# Patient Record
Sex: Female | Born: 1945 | Race: Black or African American | Hispanic: No | Smoking: Never smoker
Health system: Southern US, Community
[De-identification: ages and names within clinical notes are randomized; demographics above are authoritative.]

## PROBLEM LIST (undated history)

## (undated) ENCOUNTER — Ambulatory Visit: Admission: EM | Payer: Medicare PPO | Source: Home / Self Care

## (undated) DIAGNOSIS — E1140 Type 2 diabetes mellitus with diabetic neuropathy, unspecified: Secondary | ICD-10-CM

## (undated) DIAGNOSIS — T4145XA Adverse effect of unspecified anesthetic, initial encounter: Secondary | ICD-10-CM

## (undated) DIAGNOSIS — H269 Unspecified cataract: Secondary | ICD-10-CM

## (undated) DIAGNOSIS — R55 Syncope and collapse: Secondary | ICD-10-CM

## (undated) DIAGNOSIS — K5090 Crohn's disease, unspecified, without complications: Secondary | ICD-10-CM

## (undated) DIAGNOSIS — T8859XA Other complications of anesthesia, initial encounter: Secondary | ICD-10-CM

## (undated) DIAGNOSIS — D649 Anemia, unspecified: Secondary | ICD-10-CM

## (undated) DIAGNOSIS — I639 Cerebral infarction, unspecified: Secondary | ICD-10-CM

## (undated) DIAGNOSIS — IMO0002 Reserved for concepts with insufficient information to code with codable children: Secondary | ICD-10-CM

## (undated) DIAGNOSIS — E041 Nontoxic single thyroid nodule: Secondary | ICD-10-CM

## (undated) DIAGNOSIS — K589 Irritable bowel syndrome without diarrhea: Secondary | ICD-10-CM

## (undated) DIAGNOSIS — I10 Essential (primary) hypertension: Secondary | ICD-10-CM

## (undated) DIAGNOSIS — E119 Type 2 diabetes mellitus without complications: Secondary | ICD-10-CM

## (undated) DIAGNOSIS — E785 Hyperlipidemia, unspecified: Secondary | ICD-10-CM

## (undated) HISTORY — PX: BACK SURGERY: SHX140

## (undated) HISTORY — PX: OTHER SURGICAL HISTORY: SHX169

## (undated) HISTORY — DX: Nontoxic single thyroid nodule: E04.1

## (undated) HISTORY — DX: Type 2 diabetes mellitus with diabetic neuropathy, unspecified: E11.40

## (undated) HISTORY — DX: Cerebral infarction, unspecified: I63.9

## (undated) HISTORY — PX: COLONOSCOPY: SHX174

## (undated) HISTORY — DX: Crohn's disease, unspecified, without complications: K50.90

## (undated) HISTORY — DX: Irritable bowel syndrome, unspecified: K58.9

## (undated) HISTORY — DX: Reserved for concepts with insufficient information to code with codable children: IMO0002

## (undated) HISTORY — PX: HAND SURGERY: SHX662

## (undated) HISTORY — DX: Unspecified cataract: H26.9

## (undated) HISTORY — PX: ESOPHAGOGASTRODUODENOSCOPY: SHX1529

## (undated) HISTORY — DX: Syncope and collapse: R55

## (undated) HISTORY — DX: Hyperlipidemia, unspecified: E78.5

## (undated) HISTORY — DX: Essential (primary) hypertension: I10

## (undated) HISTORY — PX: ABDOMINAL HYSTERECTOMY: SHX81

## (undated) HISTORY — DX: Anemia, unspecified: D64.9

## (undated) HISTORY — DX: Type 2 diabetes mellitus without complications: E11.9

---

## 1997-11-08 ENCOUNTER — Encounter: Admission: RE | Admit: 1997-11-08 | Discharge: 1998-02-06 | Payer: Self-pay

## 1999-06-28 ENCOUNTER — Ambulatory Visit (HOSPITAL_COMMUNITY): Admission: RE | Admit: 1999-06-28 | Discharge: 1999-06-28 | Payer: Self-pay | Admitting: Neurosurgery

## 1999-06-28 ENCOUNTER — Encounter: Payer: Self-pay | Admitting: Neurosurgery

## 1999-07-11 ENCOUNTER — Encounter: Payer: Self-pay | Admitting: Neurosurgery

## 1999-07-11 ENCOUNTER — Ambulatory Visit (HOSPITAL_COMMUNITY): Admission: RE | Admit: 1999-07-11 | Discharge: 1999-07-11 | Payer: Self-pay | Admitting: Neurosurgery

## 1999-07-30 ENCOUNTER — Encounter: Payer: Self-pay | Admitting: Neurosurgery

## 1999-07-30 ENCOUNTER — Ambulatory Visit (HOSPITAL_COMMUNITY): Admission: RE | Admit: 1999-07-30 | Discharge: 1999-07-30 | Payer: Self-pay | Admitting: Neurosurgery

## 2000-02-25 ENCOUNTER — Encounter: Payer: Self-pay | Admitting: Neurosurgery

## 2000-02-27 ENCOUNTER — Encounter: Payer: Self-pay | Admitting: Neurosurgery

## 2000-02-27 ENCOUNTER — Ambulatory Visit (HOSPITAL_COMMUNITY): Admission: RE | Admit: 2000-02-27 | Discharge: 2000-02-28 | Payer: Self-pay | Admitting: Neurosurgery

## 2000-08-12 HISTORY — PX: BREAST SURGERY: SHX581

## 2000-08-12 HISTORY — PX: BREAST CYST EXCISION: SHX579

## 2001-04-27 ENCOUNTER — Emergency Department (HOSPITAL_COMMUNITY): Admission: EM | Admit: 2001-04-27 | Discharge: 2001-04-27 | Payer: Self-pay | Admitting: Emergency Medicine

## 2003-04-12 ENCOUNTER — Ambulatory Visit (HOSPITAL_COMMUNITY): Admission: RE | Admit: 2003-04-12 | Discharge: 2003-04-12 | Payer: Self-pay | Admitting: Internal Medicine

## 2004-08-12 HISTORY — PX: COLONOSCOPY: SHX174

## 2004-09-27 ENCOUNTER — Ambulatory Visit (HOSPITAL_COMMUNITY): Admission: RE | Admit: 2004-09-27 | Discharge: 2004-09-28 | Payer: Self-pay | Admitting: Orthopaedic Surgery

## 2005-05-02 ENCOUNTER — Ambulatory Visit: Payer: Self-pay | Admitting: Internal Medicine

## 2005-05-10 ENCOUNTER — Ambulatory Visit: Payer: Self-pay | Admitting: Internal Medicine

## 2005-05-21 ENCOUNTER — Ambulatory Visit: Payer: Self-pay | Admitting: Internal Medicine

## 2005-05-21 ENCOUNTER — Encounter: Payer: Self-pay | Admitting: Internal Medicine

## 2005-05-21 ENCOUNTER — Ambulatory Visit (HOSPITAL_COMMUNITY): Admission: RE | Admit: 2005-05-21 | Discharge: 2005-05-21 | Payer: Self-pay | Admitting: Internal Medicine

## 2005-06-04 ENCOUNTER — Ambulatory Visit (HOSPITAL_COMMUNITY): Admission: RE | Admit: 2005-06-04 | Discharge: 2005-06-04 | Payer: Self-pay | Admitting: Internal Medicine

## 2005-06-10 HISTORY — PX: OTHER SURGICAL HISTORY: SHX169

## 2007-01-30 ENCOUNTER — Ambulatory Visit: Payer: Self-pay | Admitting: Internal Medicine

## 2007-04-07 ENCOUNTER — Ambulatory Visit: Payer: Self-pay | Admitting: Internal Medicine

## 2007-05-04 ENCOUNTER — Ambulatory Visit: Payer: Self-pay | Admitting: Internal Medicine

## 2007-05-24 ENCOUNTER — Encounter: Admission: RE | Admit: 2007-05-24 | Discharge: 2007-05-24 | Payer: Self-pay | Admitting: Otolaryngology

## 2008-03-31 ENCOUNTER — Ambulatory Visit (HOSPITAL_COMMUNITY): Admission: RE | Admit: 2008-03-31 | Discharge: 2008-03-31 | Payer: Self-pay | Admitting: Obstetrics and Gynecology

## 2008-04-22 ENCOUNTER — Encounter (HOSPITAL_COMMUNITY): Admission: RE | Admit: 2008-04-22 | Discharge: 2008-05-09 | Payer: Self-pay | Admitting: Endocrinology

## 2008-06-01 DIAGNOSIS — E1169 Type 2 diabetes mellitus with other specified complication: Secondary | ICD-10-CM | POA: Insufficient documentation

## 2008-06-01 DIAGNOSIS — E1159 Type 2 diabetes mellitus with other circulatory complications: Secondary | ICD-10-CM | POA: Insufficient documentation

## 2008-06-01 DIAGNOSIS — R143 Flatulence: Secondary | ICD-10-CM

## 2008-06-01 DIAGNOSIS — R635 Abnormal weight gain: Secondary | ICD-10-CM | POA: Insufficient documentation

## 2008-06-01 DIAGNOSIS — M129 Arthropathy, unspecified: Secondary | ICD-10-CM | POA: Insufficient documentation

## 2008-06-01 DIAGNOSIS — E119 Type 2 diabetes mellitus without complications: Secondary | ICD-10-CM | POA: Insufficient documentation

## 2008-06-01 DIAGNOSIS — R197 Diarrhea, unspecified: Secondary | ICD-10-CM | POA: Insufficient documentation

## 2008-06-01 DIAGNOSIS — R142 Eructation: Secondary | ICD-10-CM

## 2008-06-01 DIAGNOSIS — K648 Other hemorrhoids: Secondary | ICD-10-CM | POA: Insufficient documentation

## 2008-06-01 DIAGNOSIS — R141 Gas pain: Secondary | ICD-10-CM | POA: Insufficient documentation

## 2008-06-01 DIAGNOSIS — K589 Irritable bowel syndrome without diarrhea: Secondary | ICD-10-CM | POA: Insufficient documentation

## 2008-06-01 DIAGNOSIS — K921 Melena: Secondary | ICD-10-CM | POA: Insufficient documentation

## 2008-06-01 DIAGNOSIS — I152 Hypertension secondary to endocrine disorders: Secondary | ICD-10-CM | POA: Insufficient documentation

## 2008-06-01 DIAGNOSIS — D638 Anemia in other chronic diseases classified elsewhere: Secondary | ICD-10-CM | POA: Insufficient documentation

## 2008-06-01 DIAGNOSIS — E785 Hyperlipidemia, unspecified: Secondary | ICD-10-CM

## 2008-06-01 DIAGNOSIS — I10 Essential (primary) hypertension: Secondary | ICD-10-CM

## 2008-06-01 DIAGNOSIS — R159 Full incontinence of feces: Secondary | ICD-10-CM | POA: Insufficient documentation

## 2009-07-19 ENCOUNTER — Ambulatory Visit: Payer: Self-pay | Admitting: Internal Medicine

## 2009-07-24 LAB — CONVERTED CEMR LAB
Basophils Absolute: 0 10*3/uL (ref 0.0–0.1)
Eosinophils Absolute: 0.2 10*3/uL (ref 0.0–0.7)
HCT: 36.2 % (ref 36.0–46.0)
Hemoglobin: 11.5 g/dL — ABNORMAL LOW (ref 12.0–15.0)
MCHC: 31.8 g/dL (ref 30.0–36.0)
MCV: 85.8 fL (ref 78.0–100.0)
Monocytes Absolute: 0.4 10*3/uL (ref 0.1–1.0)
Monocytes Relative: 6 % (ref 3–12)
RBC: 4.22 M/uL (ref 3.87–5.11)
WBC: 6.7 10*3/uL (ref 4.0–10.5)

## 2009-08-12 DIAGNOSIS — I639 Cerebral infarction, unspecified: Secondary | ICD-10-CM

## 2009-08-12 HISTORY — DX: Cerebral infarction, unspecified: I63.9

## 2009-08-18 ENCOUNTER — Ambulatory Visit (HOSPITAL_COMMUNITY): Admission: RE | Admit: 2009-08-18 | Discharge: 2009-08-18 | Payer: Self-pay | Admitting: Internal Medicine

## 2009-08-18 ENCOUNTER — Ambulatory Visit: Payer: Self-pay | Admitting: Internal Medicine

## 2009-08-23 ENCOUNTER — Encounter: Payer: Self-pay | Admitting: Internal Medicine

## 2009-08-23 ENCOUNTER — Telehealth (INDEPENDENT_AMBULATORY_CARE_PROVIDER_SITE_OTHER): Payer: Self-pay

## 2009-09-06 HISTORY — PX: COLONOSCOPY: SHX174

## 2009-10-30 ENCOUNTER — Encounter (INDEPENDENT_AMBULATORY_CARE_PROVIDER_SITE_OTHER): Payer: Self-pay

## 2009-11-10 ENCOUNTER — Encounter (INDEPENDENT_AMBULATORY_CARE_PROVIDER_SITE_OTHER): Payer: Self-pay | Admitting: *Deleted

## 2009-11-21 ENCOUNTER — Encounter: Payer: Self-pay | Admitting: Internal Medicine

## 2009-11-22 ENCOUNTER — Encounter: Payer: Self-pay | Admitting: Internal Medicine

## 2009-11-22 LAB — CONVERTED CEMR LAB
Basophils Absolute: 0 10*3/uL (ref 0.0–0.1)
Basophils Relative: 1 % (ref 0–1)
Eosinophils Absolute: 0.1 10*3/uL (ref 0.0–0.7)
Hemoglobin: 11.2 g/dL — ABNORMAL LOW (ref 12.0–15.0)
Lymphocytes Relative: 31 % (ref 12–46)
Lymphs Abs: 1.9 10*3/uL (ref 0.7–4.0)
MCHC: 30.9 g/dL (ref 30.0–36.0)
Monocytes Absolute: 0.5 10*3/uL (ref 0.1–1.0)
Monocytes Relative: 8 % (ref 3–12)
Platelets: 338 10*3/uL (ref 150–400)
RDW: 14.4 % (ref 11.5–15.5)

## 2010-04-30 ENCOUNTER — Encounter (INDEPENDENT_AMBULATORY_CARE_PROVIDER_SITE_OTHER): Payer: Self-pay

## 2010-06-15 ENCOUNTER — Encounter: Payer: Self-pay | Admitting: Internal Medicine

## 2010-06-29 ENCOUNTER — Encounter: Payer: Self-pay | Admitting: Internal Medicine

## 2010-09-13 NOTE — Progress Notes (Signed)
----   Converted from flag ---- ---- 08/23/2009 11:56 AM, Felicia Merl LPN wrote: Per Dr. Roseanne Kaufman note, pt to see extender in 3 months with CBC, and  to be told absolutely no ASA or NSAIDS. Pt was informed. ------------------------------

## 2010-09-13 NOTE — Letter (Signed)
Summary: DISABILITY DETERMINATION  DISABILITY DETERMINATION   Imported By: Hoy Morn 06/15/2010 14:38:00  _____________________________________________________________________  External Attachment:    Type:   Image     Comment:   External Document

## 2010-09-13 NOTE — Letter (Signed)
Summary: Recall, Labs Needed  [facility]  [address]   [place], [place] [address]   Phone: [phone]  Fax: [phone]    October 30, 2009  MELAINIE KRINSKY [address] [place], [place]  [address] December 07, 1945   Dear Ms. Trudo,   Our records indicate it is time to repeat your blood work.  You can take the enclosed form to the lab on or near the date indicated.  Please make note of the new location of the lab:   [place], 2nd floor   [place] office will call you within a week to ten business days with the results.  If you do not hear from [place] in 10 business days, you should call the office.  If you have any questions regarding this, call the office at [phone], and ask for the nurse.  Labs are due on 11/21/2009.   Sincerely,    Burnadette Peter LPN  [facility] Ph: [phone]   Fax: [phone]

## 2010-09-13 NOTE — Letter (Signed)
Summary: DISABILITY DETERMINATION  DISABILITY DETERMINATION   Imported By: Hoy Morn 06/29/2010 13:34:56  _____________________________________________________________________  External Attachment:    Type:   Image     Comment:   External Document

## 2010-09-13 NOTE — Letter (Signed)
Summary: Patient Notice, Colon Biopsy Results  [facility]  [address]   [place], [place] [address]   Phone: [phone]  Fax: [phone]       August 23, 2009   Felicia Pugh [address] [place], [place]  [address] [birth year]    Dear Ms. Timberman,  I am pleased to inform you that the biopsies taken during your recent colonoscopy did not show any evidence of cancer upon pathologic examination.  They did show inflammation.  Additional information/recommendations:  Please call [phone] to schedule a return visit to review your condition.  Continue with the treatment plan as outlined on the day of your exam.  Please call us if you are having persistent problems or have questions about your condition that have not been fully answered at this time.  Sincerely,    R. Garfield Cornea MD  [facility] Ph: [phone]    Fax: [phone]   Appended Document: Patient Notice, Colon Biopsy Results Letter mailed.

## 2010-09-13 NOTE — Letter (Signed)
Summary: [facility]  [facility]  [address]   [place], [place] [address]   Phone: [phone]  Fax: [phone]    11/10/2009  Felicia Pugh [address] [place], [place]  [address] 1945/11/18  Dear Ms. [facility],   Your physician has indicated that:   _______it is time to schedule an appointment.   _______you missed your appointment on______ and need to call and  reschedule.   _______you need to have lab work done.   _______you need to schedule an appointment to discuss lab or test results.   _______you need to call to reschedule your appointment that was scheduled on _________.   Please call our office at  [phone].    Thank you,    [facility] Ph: [phone]   Fax: [phone]

## 2010-09-13 NOTE — Miscellaneous (Signed)
Summary: Orders Update  Clinical Lists Changes  Orders: Added new Test order of T-CBC w/Diff [phone]) - Signed

## 2010-09-13 NOTE — Letter (Signed)
Summary: Recall, Labs Needed  [facility]  [address]   [place], [place] [address]   Phone: [phone]  Fax: [phone]    April 30, 2010  Felicia Pugh [address] [place], [place]  [address] 1946-06-25   Dear Ms. Buenger,   Our records indicate it is time to repeat your blood work.  You can take the enclosed form to the lab on or near the date indicated.  Please make note of the new location of the lab:   [place], 2nd floor   [place] office will call you within a week to ten business days with the results.  If you do not hear from [place] in 10 business days, you should call the office.  If you have any questions regarding this, call the office at [phone], and ask for the nurse.  Labs are due on 05/24/2010.   Sincerely,    Burnadette Peter LPN  [facility] Ph: [phone]   Fax: [phone]

## 2010-09-13 NOTE — Miscellaneous (Signed)
Summary: Orders Update  Clinical Lists Changes  Orders: Added new Test order of T-CBC w/Diff [phone]) - Signed

## 2010-12-25 NOTE — Assessment & Plan Note (Signed)
NAMEMarland Kitchen  Felicia, Pugh                 CHART#:  [id number]   DATE:  04/07/2007                       DOB:  April 13, 1946   Follow up diarrhea.   Felicia Pugh was last seen January 30, 2007. She has continued nonbloody,  watery diarrhea, upwards of four stools or more daily. Sometimes has  some accidents and has to wear protection at work.   This is in spite of taking Bentyl and Imodium. She had stool studies  done over at Dr. Erick Blinks and associates which basically what we have  from June is some negative stool culture and negative O&P. Do not see  white cells or clostridium difficile on the report. She has a history of  small-bowel and ileocecal valve ulcerations felt to be secondary to  NSAIDs although she adamantly denies any NSAIDs now. She did take a  seven-day course of ketorolac for her back recently and called in her,  and we advised her to take no more than a week's worth of therapy.   There is no family history of inflammatory bowel disease. Prior  colonoscopy demonstrated ileocecal valve ulcerations. Celiac antibody  panel came back negative. She has not lost any further weight. She has  not had any nausea or vomiting. Diabetes has been fairly well  maintained. CRP came back negative back on February 02, 2007 (0.1), mild  anemia with hemoglobin and hematocrit of 11.4 and 37.8. She has a  history of a hyper-proliferative anemia. She has been followed by Dr.  Robina Ade previously. Prior Givens capsule study demonstrated single erosion  versus AVM in the distal ileum. Otherwise was unremarkable.   CURRENT MEDICATIONS:  See updated list.   ALLERGIES:  No known drug allergies.   Exam today, looks in no acute distress. Weight 180, height 5 foot 2,  temperature 97.6. BP 180/82, pulse 72.  SKIN:  Warm and dry.  CHEST:  Lungs are clear to auscultation.  CARDIAC:  Regular rate and rhythm without murmur, gallop or rub.  ABDOMEN:  Flat, positive bowel sounds, soft, nontender without  appreciable mass or organomegaly.   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 Preliminary stool studies  came back negative. Diabetic visceral enteropathy remains in the  differential. New onset inflammatory bowel disease also in the  differential, but I am certainly not ready to give this lady that  diagnosis at this time. She has used nonsteroidals in the past but  denies any at this point in time.   RECOMMENDATIONS:  Will try off-label use cholestyramine 2 grams orally  daily, not to be taken within 2 hours before or after medications. This  approach has been discussed. She is to let me know if she gets  constipated. We will also go ahead and send off stool for a clostridium  difficile toxin assay as well as stool lactoferrin and a qualitative  fecal fat smear. She is to keep a stool diarrhea. I will plan to see  this nice lady back in one month.       Felicia Pugh, M.D.  Electronically Signed     RMR/MEDQ  D:  04/07/2007  T:  04/07/2007  Job:  [id number]

## 2010-12-25 NOTE — Assessment & Plan Note (Signed)
NAMEMarland Pugh  MARCH, JOOS                 CHART#:  [id number]   DATE:  05/04/2007                       DOB:  [birth year]   Followup diarrhea. Felicia Pugh had a fairly extensive evaluation for  diarrhea, continues to have problems, last seen April 07, 2007. She had  a single erosion and possible AVM on Givens previously, ulcerative  ileocecal valve, had been taking non steroidals, but adamantly denies  any these days. Celiac antibody came back negative as multiple stool  studies have done. CRP was normal. She is on Imodium and Bentyl and we  added 2 grams cholestyramine at her last visit. She says she can tell no  difference. She has 4 to 6 bowel movements daily. Almost never has a  formed bowel movement, has multiple accidents, and soiled her under  clothes and has had to leave work on more than 1 occasion since she was  last seen. She has not had any nausea or vomiting. Her weight is down 1  pound. Fecal fat qualitative analysis came back normal. Lactoferrin  however came back positive. C. Diff toxin assay came back negative.   CURRENT MEDICATIONS:  See up dated list.   ALLERGIES:  No known drug allergies.   FAMILY HISTORY:  Negative for chronic GI or liver illness aside from 1  sister with stomach cancer.   PHYSICAL EXAMINATION:  Today she looks well, weight 179 down 1 pound,  height 5 feet 2 inches, temperature 97.8, blood pressure 158/88, pulse  72.  SKIN: Warm and dry.  CHEST: Lungs are clear to auscultation.  CARDIAC EXAM: Regular rate and rhythm without murmur, gallop, or rub.  ABDOMEN: Nondistended, obese, soft, nontender without appreciable mass  or organomegaly.   ASSESSMENT:  Refractory diarrhea, history of ulcerative ileocecal valve.  Distant history of non steriodals but none now. Differential  at this  point I suppose would include early inflammatory bowel disease either  Crohn's ileocolitis or indeterminate colitis, irritable bowel syndrome,  and/or diabetic visceral  enteropathy would not adequately explain her  symptoms. At this juncture I have recommended that Felicia Pugh go ahead  and try a 1 month course of Entocort , keep stool diary, come back to  see me in 4 weeks and we will size things up. I told Felicia Pugh we may  be looking at a tertiary referral for second opinion as far as etiology  of her diarrhea is concerned. I have talked to her about Entocort given  her samples, and a prescription.  She is going to be taken 9 mg a day  for the next month. She is to continue to watch her blood sugars. It is  notable that back in 2004 Dr. Carlean Purl did Givens for [place] and she did have  ulcerations  throughout her small bowel felt to be more likely due to NSAIDs at the  time (which she was taking). Further recommends to follow.       Bridgette Habermann, M.D.  Electronically Signed     RMR/MEDQ  D:  05/04/2007  T:  05/04/2007  Job:  [id number]   cc:   Carolann Littler, M.D.

## 2010-12-25 NOTE — Assessment & Plan Note (Signed)
NAMEMarland Pugh  Felicia, TANKSLEY                 CHART#:  [id number]   DATE:  01/30/2007                       DOB:  07-22-1946   PROBLEM:  Diarrhea.  I have not seen this patient in really a good 2+  years.  She was evaluated here for diarrhea and anemia extensively  previously.  Last colonoscopy was 05/13/2005.  She was found to have an  ulcerative ileocecal valve.  Biopsies were nonspecific.  The rest of the  colon and terminal ileum appeared normal.  She has had 2 given small  bowel capsule studies 1 in [place] and 1 here, which revealed some  erosion and ulcerations in her small bowel, felt to be previously  related to NSAID.  She has a long history of Hemoccult positive stool,  previously felt to be related to nonsteroidals, but she also has a  myeloproliferative state.  Has been followed by Dr. Sonny Dandy, but she has  not seen anybody recently.  She states her diarrhea settled down to 1 to  2 bowel movements daily back in February of last year, until the last 2  months she has had 4 to 5 bowel movements daily and they have been  loose.  She has been taking Imodium.  She took a Medrol Dosepak recently  from her doctors at [employer] for low back pain, but she  adamantly denies any form of nonsteroidal agent recently.  She may have  had some stool studies through [facility], but I do not have  those results.  Celiac panel previously was determined to be negative.  EGD previously demonstrated some erosions in her bulb.  H. pylori  serology and CLO-testing came back negative previously.  She has not had  any melena or rectal bleeding.  No hematemesis.  No odynophagia,  dysphagia, early satiety, nausea or vomiting.  She weighed 196 pounds  back in 2006.  She weighs 180 pounds today.  She continues to work full  time for [employer], where she has worked now 60 years.   PAST MEDICAL HISTORY:  Significant for hyper proliferative anemia,  question myelodysplastic syndrome, previously  followed by Dr. Sonny Dandy.  Type 2 diabetes mellitus.  Hypertension.  Hypercholesterolemia.  Chronic  anemia.   SURGICAL HISTORY:  Include a breast biopsy, tubal ligation, partial  hysterectomy, EGD, and colonoscopy.   CURRENT MEDICATIONS:  Zocor 80 mg daily.  Folic acid 1 mg daily.  Glipizide ER 10 mg daily.  Lisinopril 20 mg daily.  Glucosamine b.i.d.  Multivitamin daily.   ALLERGIES:  No known drug allergies.   FAMILY HISTORY:  No history of IBD or colorectal neoplasia.  Mother died  with a stroke.  Father died with diabetic coma.   SOCIAL HISTORY:  The patient is married.  Employed with [employer].  No  tobacco.  No alcohol.  No illicit drugs.   REVIEW OF SYSTEMS:  No fevers or chills.  Some slow weight loss over the  past 2 years, as outlined above.   PHYSICAL EXAMINATION:  A very pleasant 65 year old lady resting  comfortably.  Weight 180, height 5 feet 3 inches, temperature 98, BP 124/78, pulse 76.  SKIN:  Warm and dry.  HEENT:  No scleral icterus.  Conjunctivae pink.  Oral cavity no lesions.  CHEST AND LUNGS:  Clear to auscultation.  CARDIAC:  Regular rate  and rhythm without murmur, gallop, or rub.  BREASTS:  Deferred.  ABDOMEN:  Obese.  Positive bowel sounds.  Soft and nontender.  Without  appreciable mass or organomegaly.  RECTAL:  Good sphincter tone.  No mass.  Rectal vault, scant formed  brown stool.  Hemoccult negative.   IMPRESSION:  The patient is a pleasant 65 year old African-American  female with long-standing anemia, history of a gastrointestinal bleed,  history of small bowel and ileocecal valve ulcers of uncertain  significance, although has been associated with NSAID in the past, she  denies adamantly any use more recently.  Now having recurrent diarrhea.  [facility] IBD panel is previously negative.  Small bowel follow through  previously negative.  She has not been on any antibiotics recently.  This could be diabetic visceral enteropathy producing diarrhea.   She  could have a superimposed infectious process, or we could be on the very  front end of new-onset inflammatory bowel disease that has not been  otherwise clinically manifest at this time.  Of note, her diarrhea did  not improve when she was on a brief course of steroids for her low back  recently.  Celiac pane came back negative previously, although it would  be unusual for an African-American to have celiac disease anyway.   It does not sound as though she has steatorrhea.  She has no reason to  have a malabsorptive syndrome.   RECOMMENDATIONS:  Begin Bentyl 10 mg orally t.i.d. prescription given.  Will send off a complete set of stool studies, obtain a CBC, BMET, and  sed rate, and C-reactive protein today.  Will make further  recommendations in the very near future.       Bridgette Habermann, M.D.  Electronically Signed     RMR/MEDQ  D:  01/30/2007  T:  01/30/2007  Job:  [id number]   cc:   [facility]  Deeann Saint, M.D.

## 2010-12-28 NOTE — Op Note (Signed)
Felicia Pugh, Felicia Pugh NO.:  [id number]   MEDICAL RECORD NO.:  [id number]          PATIENT TYPE:  OIB   LOCATION:  2899                         FACILITY:  [facility]   PHYSICIAN:  Rennis Harding, M.D.        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   PROCEDURE:  Right L5-S1 microdiskectomy.   SURGEON:  Rennis Harding, M.D.   ASSISTANT:  Karenann Cai, P.A.   ANESTHESIA:  General endotracheal.   COMPLICATIONS:  None.   INDICATIONS:  The patient is a 65 year old female with severe right lower  extremity radiculopathy secondary to an extruded disk herniation.  She had a  previous history of L4-5 diskectomy on the left side.  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  There is no slip at the  L5-S1 level, where she is currently suffering from radiculopathy.   I reviewed the risks and benefits of microdiskectomy with her.  She has  failed to respond to conservative measures and elected to proceed.   PROCEDURE:  The patient was properly identified in the holding area and  taken to the operating room, underwent general endotracheal anesthesia,  without difficulty given prophylactic IV antibiotics.  She was carefully  positioned prone on the Wilson frame.  All bony prominences were padded,  face and eyes protected all times, back prepped and draped in the usual  sterile fashion.  A midline incision was made over the L5-S1 level.  Dissection was carried through the deep fascia.  A curette was placed into  the L4-5 interspace and x-ray taken confirming its location.  We then  exposed the L5-S1 interspace, and a second x-ray was taken confirming this  location.  A McCullough retractor was placed.  A high-speed bur used to  remove the inferior half of the L5 lamina.  The ligamentum  flavum was  removed piecemeal.  The S1 nerve root was identified and seen to be  displaced dorsally by the disk rupture.  The nerve root could not be  mobilized medially because of the extruded fragment.  We could visualize the  disk herniation within the axilla of the nerve root.  A micropituitary was  used to remove a disk fragment from within the axilla of the nerve root.  We  were then able to gently tease the nerve root medially and removed a large  free fragment.  We explored the posterior annulus and could not find the  rent.  We elected not to enter the disk space.  We explored the spinal canal  and felt that the decompression was complete.  The wound was irrigated.  Bleeding was controlled by electrocautery and Gelfoam.  Fentanyl 2 mL left  in the epidural space.  A small piece of Gelfoam was left over the exposed  dura.  The deep fascia closed with 0 Vicryl, subcutaneous layer closed with  2-0 Vicryl,  followed by Dermabond on the skin edges.  The patient was turned supine, extubated without difficulty and transferred  to recovery in stable condition.  It should be noted that the surgical  microscope was utilized after the initial exposure.      MC/MEDQ  D:  09/27/2004  T:  09/28/2004  Job:  [id number]

## 2010-12-28 NOTE — H&P (Signed)
NAME:  Felicia Pugh, Felicia Pugh NO.:  [id number]   MEDICAL RECORD NO.:  [id number]          PATIENT TYPE:  OIB   LOCATION:  NA                           FACILITY:  [facility]   PHYSICIAN:  Rennis Harding, M.D.        DATE OF BIRTH:  [birth year]   DATE OF ADMISSION:  09/27/2004  DATE OF DISCHARGE:                                HISTORY & PHYSICAL   CHIEF COMPLAINT:  Pain in my back and weakness in my leg.[name]   PRESENT ILLNESS:  A 65 year old black female who is status post lumbar  laminectomy from December 2005.  She has had persistent chronic back pains  after the lumbar laminectomy in 2003 by Dr. Christella Noa.  That surgery was done  at L4-L5.  Around Christmas time of 2005, she had a marked increase in pain  with radiation to the right lower extremity, numbness, tingling, and  weakness.  She had been treated with dose pack, pain medications.  MRI has  shown a large right L5-S1 disk herniation.  She has a positive straight leg  raise on the right and decreased sensation at the right S1 distribution.  She has lost her right ankle jerk.  Due to these problems __________  and  her persistent pain despite her stoicism and managing the pain, it is felt  she would benefit with surgical intervention and is admitted for a L5-S1  microdiskectomy on the right.   PAST MEDICAL HISTORY:  1.  This lady has been in relatively good health throughout her life time.  2.  She has developed hypertension.  3.  Chronic anemia.  4.  Non-insulin-dependent diabetes mellitus.   She has no medical allergies.   CURRENT MEDICATIONS:  1.  Avandia 8 mg one daily.  2.  Glipizide 10 mg one daily.  3.  Lisinopril 20 mg one daily.  4.  Zocor 40 mg one daily.  5.  Folic acid 1 mg one daily.  6.  She is taking hydrocodone for discomfort.  7.  Skelaxin as a muscle relaxant.   FAMILY PHYSICIAN:  Dr. Mercie Eon of [facility]  in [place], [place].   PAST SURGERIES:  1.  Lumbar  laminectomy in 2003.  2.  Benign breast biopsy in the past.  3.  Tubal ligation, 1969.  4.  Hysterectomy, 1987.   FAMILY HISTORY:  Positive for hypertension, diabetes, cancer, stroke, and  arthritis.   SOCIAL HISTORY:  The patient is married.  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  Has no intake of alcohol, tobacco products.  Has five  children and her husband will be major caregiver after surgery.   REVIEW OF SYSTEMS:  CNS:  No seizure disorder, paralysis, numbness, double  vision but the patient does have tingling and weakness in the lower  extremity with radicular pain on the right.  Cardiovascular:  No chest pain,  no angina, no orthopnea.  RESPIRATORY:  No productive cough, no hemoptysis,  no shortness of breath.  GASTROINTESTINAL:  No nausea, vomiting, melena, or  bloody stool but the  patient does have intermittent diarrhea.  GENITOURINARY:  No discharge, dysuria, or hematuria.  MUSCULOSKELETAL:  Primarily in present illness.   PHYSICAL EXAMINATION:  GENERAL:  Alert, cooperative, well groomed, 4-year-  old black female.  VITAL SIGNS:  Blood pressure 112/56, pulse 80, respirations 12.  HEENT:  Normocephalic.  PERRLA.  EOMs intact.  Oropharynx is clear.  CHEST:  Clear to auscultation.  No rhonchi, no rales.  HEART:  Regular rate and rhythm.  There is a grade 3/6 murmur heard best at  right sternal border.  Non-radiating to the neck.  ABDOMEN:  Soft, nontender.  Liver spleen not felt.  GENITALIA/RECTAL/PELVIC/BREASTS:  Not done, not pertinent to present  illness.  EXTREMITIES:  As in present illness above.   ADMITTING DIAGNOSIS:  1.  L5-S1 herniated nucleus pulposus.  2.  Hypertension.  3.  Non-insulin-dependent diabetes mellitus.  4.  Chronic anemia.   PLAN:  The patient will undergo a L5-S1 microdiskectomy on the right.  She  has been cleared preoperatively by Dr. Mercie Eon for this surgery.  Today she is fitted with a lumbosacral corset which will be  used during the  postoperative period.  This history and physical was performed in our office  today.      DLU/MEDQ  D:  09/19/2004  T:  09/19/2004  Job:  [id number]   cc:   Mercie Eon, Dr.  [facility]  [place], [place]

## 2010-12-28 NOTE — Op Note (Signed)
NAMESHELENE, KRAGE                ACCOUNT NO.:  [id number]   MEDICAL RECORD NO.:  [id number]          PATIENT TYPE:  AMB   LOCATION:  DAY                           FACILITY:  [facility]   PHYSICIAN:  R. Garfield Cornea, M.D. DATE OF BIRTH:  [birth year]   DATE OF PROCEDURE:  06/04/2005  DATE OF DISCHARGE:  06/04/2005                                 OPERATIVE REPORT   OPERATION/PROCEDURE:  Givens Capsule study report.   INDICATIONS:  The patient is a 65 year old lady with anemia, hemoccult  positive stool.  Prior Givens study of the small bowel down in [place]  implied erosions and ulcerations. Small bowel was felt to be secondary to  NSAID injury.  She is refrained from using NSAID. She continues to be  hemoccult positive.  Colonoscopy on May 21, 2005 demonstrated eroded  ulcerated ileocecal valve with mucosal hemorrhage present.  Biopsies  revealed nonspecific ulceration.  Prior small bowel follow-through was  normal.  Repeat Givens Capsule study now being done to reassess her small  bowel.  This approach has been discussed with the patient along with  potential risks, benefits and alternatives.   PROCEDURAL DATA:  Height 62 inches.  Weight 197 pounds.  Waist 43 inches.  Built stocky.  Gastric passage time 19 minutes.  Small bowel passage time 2  hours and 4 minutes.  The patient was found to have an erosion versus a  small vascular malformation in the distal ileum.  Otherwise the small bowel  mucosa appeared normal.   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  Otherwise, normal small bowel.  The patient had a frank ulcer  at the ileocecal valve on recent colonoscopy. Biopsies nonspecific.  Etiology of this lesion is not defined.   She adamantly denies use of any form of nonsteroidal agent.  I doubt we are  dealing with ischemia.  There are isolated reports of lone ileocecal valve  ulcerations in a setting of purgative prep such as  sodium phosphate for  colonoscopy.  The patient is on a preparation called bentonite detox which  contains bentonite clay. This is a some type of herbal detoxicate which is  built to cleanse the colon of toxins.  This may or may not have anything to  do with the endoscopic findings __________  hemoccult-positive stool.   For now, I will ask Mrs. Whobrey to stop taking this preparation and refrain  from taking any aspirin, NSAID preparations. We will go ahead and do a  [employer] antibody screen for inflammatory bowel disease.  Will make  further recommendations in the very near future.      Bridgette Habermann, M.D.  Electronically Signed     RMR/MEDQ  D:  06/10/2005  T:  06/10/2005  Job:  [id number]   cc:   Carolann Littler, M.D.  Fax: [place]  Fax: [phone]

## 2010-12-28 NOTE — Op Note (Signed)
[place]  Patient:    Felicia Pugh, Felicia Pugh                       MRN: [id number] Proc. Date: 02/27/00 Adm. Date:  [id number] Disc. Date: [id number] Attending:  Ashok Pall                           Operative Report  PREOPERATIVE DIAGNOSIS: 1. Displaced disk, left L4-5 lateral. 2. Left L4 radiculopathy.  POSTOPERATIVE DIAGNOSIS: 1. Displaced disk, left L4-5 lateral. 2. Left L4 radiculopathy.  OPERATION PERFORMED:  Left lateral diskectomy with microdissection L4-5 including a partial superior facetectomy, L4-5.  COMPLICATIONS:  None.  SURGEON:  Kyle L. Christella Noa, M.D.  ASSISTANT:  Marchia Meiers. Vertell Limber, M.D.  FINDINGS:  Large displaced disk laterally placed causing pressure on the left L4 nerve root.  ANESTHESIA:  INDICATIONS FOR PROCEDURE:  The patient is a 65 year old woman who has had pain in the back, hips and left lower extremity for approximately two years. Felicia Pugh had conservative treatment including epidural steroid injections which did not improve her situation.  I therefore recommended and she agreed to undergo far lateral lumbar diskectomy at L4-5.  DESCRIPTION OF PROCEDURE:  Felicia Pugh was brought to the operating room, intubated and placed under general anesthesia without difficulty.  After being prepped, she had a spinal needle placed sterilely to localize the incision. X-ray was taken and the needle removed.  I then infiltrated my proposed incision with 0.5% lidocaine 1:200,000 strength epinephrine.  I opened the incision with a #10 blade and took this down to the thoracolumbar fascia sharply and with monopolar cautery.  I proceeded to use the monopolar cautery to reflect in the superperiosteal fashion, the paraspinous muculature surrounding the lamina of L4.  I exposed the lamina and thought it was L4.  I placed a double-ended ganglion knife inferior to this lamina, took an x-ray and it was identified as the L4 lamina.  Using  that as a guide, I then retracted over the facets at L4-5 and at L3-4 to gain adequate tissue exposure.  I then identified the pars interarticularis at L4 and I drilled out the pars using a Midas Rex drill.  I brought the microscope into position and was able to use Kerrison punches to get underneath the thin shelf of bone that I left after drilling.  I exposed the ligamentum flavum and after removing that, exposed the left L4 nerve root.  I clearly identified the pedicle and the nerve and then dissected inferior to the nerve root.  There was a large hump of disk material which was present there.  I coagulated the surface, then opened that with a #15 blade.  Using a combination of pituitary rongeurs and nerve hooks, I was able to remove the disk material.  I then inspected the nerve root and felt that there was no compression laterally or medially on the nerve root.  I irrigated the wound.  I then placed Depakote in the resection site.  I irrigated prior to doing that.  I closed the wound in layered fashion reapproximating the thoracolumbar fascia, then the subcutaneous tissues.  I used Durabond for a dressing.  The patient was then extubated, rolled supine and was moving all extremities. DD:  02/27/00 TD:  02/28/00 Job: [id number]

## 2010-12-28 NOTE — H&P (Signed)
[place]. [facility]  Patient:    Felicia Pugh, Felicia Pugh                       MRN: [id number] Adm. Date:  [id number] Attending:  Ashok Pall                         History and Physical  ADMISSION DIAGNOSES: 1. Displaced disk, left L4-5 in the lateral position. 2. Left L4 radiculopathy.  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  She says at first it hurt only some of the time, now it hurts all of the time.  She has occasional numbness in the left lower extremity.  She does not have weakness but she does have leg and back pain.  The leg pain is mainly on her left side, thought it can come on both sides.  She has had no bowel or bladder dysfunction.  She has missed sometime at work because of the pain, and she has had some physical therapy for this, which has not provided lasting relief. Felicia Pugh, on MRI, has a lateral disk herniation at L4-5 on the left side. She had minor disk bulges in the lumbar spine but nothing causing neural compression and no motor changes.  Alignment was quite good.  Plain film showed no evidence of significant spondylosis.  Some minor changes but nothing which was so severe.  I therefore had Felicia Pugh try epidural steroid injections.  The injections did not help and she still is having a lot of pain in the left lower extremity.  She has had some shooting pain which went into both hips.  I explained to Felicia Pugh that I did not expect the operation at L4-5 on the left side to effect her right lower extremity in any positive or negative way.  She also understands that I do not expect this to improve her low-back pain to any significant degree.  PAST MEDICAL HISTORY:  Includes hypertension, diabetes, gastrointestinal problems.  ALLERGIES:  No known drug allergies.  MEDICATIONS: 1. Avandia 4 mg once a day. 2. Glucotrol XL 10 mg once  per day. 3. Glucophage 500 mg once per day. 4. Zestril 10 mg once per day. 5. Lotronex 1 mg two times per day. 6. She takes ibuprofen and Motrin over-the-counter for pain.  REVIEW OF SYSTEMS:  Positive for eye glasses, hypertension, foot edema, leg pain with walking, leg weakness, back pain, leg pain, diabetes.  She denied constitutional, ear, nose, throat, mouth, respiratory, gastrointestinal, genitourinary, skin, neurological, psychiatric, hematologic, and allergic problems.  FAMILY HISTORY:  Mother and father are both deceased.  Hypertension and diabetes present in the family.  PAST SURGICAL HISTORY:  She has had a biopsy, hysterectomy and a tubal ligation.  HABITS:  She does not smoke, drink or use illicit drugs.  PHYSICAL EXAMINATION:  VITAL SIGNS:  She is 5 feet 2 inches tall, weighing 178 pounds.  GENERAL:  On examination she alert, oriented x 4 and answers all questions appropriately.  HEENT:  Pupils equal, round, and reactive to light.  She has full extraocular movements.  She has symmetric facies.  No symmetric facial movement.  Uvula elevates in the midline.  Tongue protrudes in the midline.  EXTREMITIES:  Shoulder shrug is normal.  Normal strength in the upper extremities.  Weakness with hip flexion on the  left side.  She says it hurts to lift the leg up and without giving any resistance, she could not keep her leg in the air.  She has no difficulty with walking.  No difficulty flexing the hip when she is sitting down.  She was weak on the left than the right side.  She otherwise had good strength on her right side but did have weakness in the hamstrings on the left and in the quadriceps on the left.  Toes are downgoing to plantar stimulation and no clonus observed.  Proprioception and pinprick normal.  Normal loss of tone, bulk and coordination.  Gait was normal.  She could toe walk, heel walk and do deep knee bends.  Gait steady. No cervical masses or bruits  identified.  She had weak pulses at the wrist bilaterally.  Pulses about 64 and ______.  She also developed a erythematous pruritic, pustular lesions about three days ago.  They have sprung about her body, on the back of the neck, on her extremities, trunk.  They are pruritic and they have caused some problems in terms of itching.  She has been taking Benadryl but stopped this morning.  She has no lesions on her lower back.  She has not changed soaps, detergent or put on any new creams or lotions.  I felt that there was no risk with regards to the operation and have therefore approved with case. DD:  02/27/00 TD:  02/27/00 Job: [id number]

## 2010-12-28 NOTE — Op Note (Signed)
Felicia Pugh, Felicia Pugh                ACCOUNT NO.:  [id number]   MEDICAL RECORD NO.:  [id number]          PATIENT TYPE:  AMB   LOCATION:  DAY                           FACILITY:  [facility]   PHYSICIAN:  R. Garfield Cornea, M.D. DATE OF BIRTH:  07-15-1946   DATE OF PROCEDURE:  05/21/2005  DATE OF DISCHARGE:                                 OPERATIVE REPORT   PROCEDURE:  Colonoscopy with biopsy and ileoscopy.   INDICATIONS FOR PROCEDURE:  A 65 year old lady with history of anemia, heme  positive stool, history of NSAID injury to her small bowel. Three out of  three Hemoccult cards came back positive recently. Colonoscopy is now being  done. This approach has been discussed with the patient at length. Potential  risks, benefits, and alternatives have been reviewed and questions answered.  She is agreeable. Please see documentation in the medical record.   PROCEDURE NOTE:  O2 saturation, blood pressure, pulse, and respirations were  monitored throughout the entire procedure. Conscious sedation with IV Versed  and Demerol in incremental doses.   INSTRUMENT:  Olympus video chip system.   FINDINGS:  Digital rectal exam revealed no abnormalities.   ENDOSCOPIC FINDINGS:  Prep was adequate.   Rectum:  Examination of the rectal mucosa revealed no abnormalities. Rectal  vault was somewhat small. I was unable to retroflex. For the same reason, I  was able to see the rectal mucosa well, and it appeared normal.   Colon:  Colonic mucosa was surveyed from the rectosigmoid junction through  the left, transverse, and right colon to the area of the appendiceal  orifice, ileocecal valve, and cecum. These structures were well seen and  photographed for the record. Terminal ileum was intubated to 15 cm. From  this level, the scope was slowly withdrawn, and all previously mentioned  mucosal surfaces were again seen. It is notable there was two areas of  bleeding mucosa at ileocecal valve with apparent  underlying erosion versus  ulceration. Please see photos. However, looking up in the terminal ileum a  good 15 cm, TI appeared normal. The area of abnormality of the ileocecal  valve was biopsied for histologic study.   From this level, the scope was slowly withdrawn, and all previously  mentioned mucosal surfaces were again seen. The colonic mucosa appeared  normal otherwise. The patient tolerated the procedure well and was reactive  to endoscopy.   IMPRESSION:  1.  Normal rectum.  2.  Normal colon except for ileocecal valve, eroded/ulcerated areas at      ileocecal valve with mucosal hemorrhage present status post biopsy. The      remainder of colonic mucosa and terminal ileal mucosa appeared normal.   The patient denies nonsteroidals in any form.   RECOMMENDATIONS:  1.  Will follow up on pathology.  2.  Further recommendations to follow.      Bridgette Habermann, M.D.  Electronically Signed     RMR/MEDQ  D:  05/21/2005  T:  05/21/2005  Job:  [id number]   cc:   Deeann Saint, M.D.  Fax: [phone]   Fanny Bien  Fax: [phone]

## 2012-11-04 ENCOUNTER — Ambulatory Visit (INDEPENDENT_AMBULATORY_CARE_PROVIDER_SITE_OTHER): Payer: Medicare Other | Admitting: General Practice

## 2012-11-04 ENCOUNTER — Encounter: Payer: Self-pay | Admitting: General Practice

## 2012-11-04 VITALS — BP 155/90 | HR 92 | Temp 97.3°F | Ht 63.0 in | Wt 189.0 lb

## 2012-11-04 DIAGNOSIS — E785 Hyperlipidemia, unspecified: Secondary | ICD-10-CM

## 2012-11-04 DIAGNOSIS — E119 Type 2 diabetes mellitus without complications: Secondary | ICD-10-CM

## 2012-11-04 DIAGNOSIS — I10 Essential (primary) hypertension: Secondary | ICD-10-CM

## 2012-11-04 DIAGNOSIS — D649 Anemia, unspecified: Secondary | ICD-10-CM

## 2012-11-04 DIAGNOSIS — E559 Vitamin D deficiency, unspecified: Secondary | ICD-10-CM

## 2012-11-04 LAB — POCT CBC
Granulocyte percent: 74.5 %G (ref 37–80)
Hemoglobin: 11.3 g/dL — AB (ref 12.2–16.2)
Lymph, poc: 1.9 (ref 0.6–3.4)
MCH, POC: 27.1 pg (ref 27–31.2)
MCHC: 32.4 g/dL (ref 31.8–35.4)
MCV: 83.8 fL (ref 80–97)
POC Granulocyte: 6.6 (ref 2–6.9)
Platelet Count, POC: 353 10*3/uL (ref 142–424)
RDW, POC: 14.9 %

## 2012-11-04 LAB — BASIC METABOLIC PANEL WITH GFR
Calcium: 9.8 mg/dL (ref 8.4–10.5)
Chloride: 109 mEq/L (ref 96–112)
GFR, Est African American: >89 mL/min
GFR, Est Non African American: >89 mL/min
Potassium: 3.9 mEq/L (ref 3.5–5.3)

## 2012-11-04 LAB — HEPATIC FUNCTION PANEL
AST: 17 U/L (ref 0–37)
Indirect Bilirubin: 0.3 mg/dL (ref 0.0–0.9)
Total Bilirubin: 0.4 mg/dL (ref 0.3–1.2)

## 2012-11-04 MED ORDER — ATORVASTATIN CALCIUM 40 MG PO TABS: 40.0000 mg | ORAL_TABLET | Freq: Every morning | ORAL | Status: DC

## 2012-11-04 MED ORDER — LISINOPRIL 20 MG PO TABS: 20.0000 mg | ORAL_TABLET | Freq: Every day | ORAL | Status: DC

## 2012-11-04 MED ORDER — INSULIN DETEMIR 100 UNIT/ML ~~LOC~~ SOLN: 60.0000 [IU] | Freq: Every day | SUBCUTANEOUS | Status: DC

## 2012-11-04 MED ORDER — INSULIN DETEMIR 100 UNIT/ML ~~LOC~~ SOLN: 55.0000 [IU] | Freq: Every day | SUBCUTANEOUS | Status: DC

## 2012-11-04 MED ORDER — GLIPIZIDE 10 MG PO TABS: 10.0000 mg | ORAL_TABLET | ORAL | Status: DC

## 2012-11-04 NOTE — Progress Notes (Signed)
Subjective:    Patient ID: Felicia Pugh, female    DOB: 1946/05/17, 67 y.o.   MRN: [id number]  Diabetes She presents for her follow-up diabetic visit. She has type 1 diabetes mellitus. No MedicAlert identification noted. The initial diagnosis of diabetes was made 13 years ago. Pertinent negatives for hypoglycemia include no dizziness, headaches, nervousness/anxiousness, sleepiness, speech difficulty or sweats. Pertinent negatives for diabetes include no blurred vision, no chest pain, no fatigue, no foot ulcerations, no visual change and no weight loss. Diabetic complications include a CVA. Risk factors for coronary artery disease include dyslipidemia and hypertension. Current diabetic treatment includes insulin injections and oral agent (monotherapy). She is compliant with treatment all of the time. Her weight is stable. She is following a diabetic and high fiber (working on low fat diet) diet. Meal planning includes avoidance of concentrated sweets. She has not had a previous visit with a dietician. Exercise: very active. There is no change in her home blood glucose trend. Her breakfast blood glucose range is generally 110-130 mg/dl. (70-130, only checks in am) An ACE inhibitor/angiotensin II receptor blocker is being taken. She does not see a podiatrist.Eye exam is current (2014, January).  Hypertension This is a chronic (also hyperlipidemia) problem. The current episode started more than 1 year ago. The problem is unchanged. The problem is controlled. Pertinent negatives include no blurred vision, chest pain, headaches or sweats. There are no associated agents to hypertension. Risk factors for coronary artery disease include diabetes mellitus and dyslipidemia. Past treatments include ACE inhibitors. There are no compliance problems.  Hypertensive end-organ damage includes CVA. There is no history of a thyroid problem. CVA 2009. There is no history of chronic renal disease.      Review of Systems   Constitutional: Negative for weight loss and fatigue.  Eyes: Negative for blurred vision.  Cardiovascular: Negative for chest pain.  Neurological: Negative for dizziness, speech difficulty and headaches.  Psychiatric/Behavioral: The patient is not nervous/anxious.        Objective:   Physical Exam  Constitutional: She is oriented to person, place, and time. She appears well-developed and well-nourished.  HENT:  Head: Normocephalic and atraumatic.  Right Ear: External ear normal.  Left Ear: External ear normal.  Mouth/Throat: Oropharynx is clear and moist.  Eyes: Conjunctivae and EOM are normal. Pupils are equal, round, and reactive to light. Right eye exhibits no discharge.  Neck: Normal range of motion. No JVD present. No thyromegaly present.  Cardiovascular: Normal rate, regular rhythm and normal heart sounds.   No murmur heard. Pulmonary/Chest: Effort normal and breath sounds normal. No respiratory distress. She exhibits no tenderness.  Abdominal: Soft. Bowel sounds are normal. She exhibits no distension. There is no tenderness. There is no rebound.  Musculoskeletal: Normal range of motion. She exhibits no edema and no tenderness.  Lymphadenopathy:    She has no cervical adenopathy.  Neurological: She is alert and oriented to person, place, and time. She has normal reflexes. No cranial nerve deficit.  Skin: Skin is warm and dry.  Psychiatric: She has a normal mood and affect.   Results for orders placed in visit on 11/04/12  POCT GLYCOSYLATED HEMOGLOBIN (HGB A1C)      Result Value Range   Hemoglobin A1C 8.8    POCT CBC      Result Value Range   WBC 8.8  4.6 - 10.2 K/uL   Lymph, poc 1.9  0.6 - 3.4   POC LYMPH PERCENT 21.1  10 - 50 %L  POC Granulocyte 6.6  2 - 6.9   Granulocyte percent 74.5  37 - 80 %G   RBC 4.2  4.04 - 5.48 M/uL   Hemoglobin 11.3 (*) 12.2 - 16.2 g/dL   HCT, POC 34.8 (*) 37.7 - 47.9 %   MCV 83.8  80 - 97 fL   MCH, POC 27.1  27 - 31.2 pg   MCHC 32.4   31.8 - 35.4 g/dL   RDW, POC 14.9     Platelet Count, POC 353.0  142 - 424 K/uL   MPV 6.6  0 - 99.8 fL         Assessment & Plan:   Changes made to insulin Continue all other medications Labs pendings  F/u in 6 weeks for A1C Discussed and encouraged exercise/diet  Patient verbalized understanding  Almyra Free, FNP-C

## 2012-11-04 NOTE — Patient Instructions (Addendum)
Hypertriglyceridemia  Diet for High blood levels of Triglycerides Most fats in food are triglycerides. Triglycerides in your blood are stored as fat in your body. High levels of triglycerides in your blood may put you at a greater risk for heart disease and stroke.  Normal triglyceride levels are less than 150 mg/dL. Borderline high levels are 150-199 mg/dl. High levels are 200 - 499 mg/dL, and very high triglyceride levels are greater than 500 mg/dL. The decision to treat high triglycerides is generally based on the level. For people with borderline or high triglyceride levels, treatment includes weight loss and exercise. Drugs are recommended for people with very high triglyceride levels. Many people who need treatment for high triglyceride levels have metabolic syndrome. This syndrome is a collection of disorders that often include: insulin resistance, high blood pressure, blood clotting problems, high cholesterol and triglycerides. TESTING PROCEDURE FOR TRIGLYCERIDES  You should not eat 4 hours before getting your triglycerides measured. The normal range of triglycerides is between 10 and 250 milligrams per deciliter (mg/dl). Some people may have extreme levels (1000 or above), but your triglyceride level may be too high if it is above 150 mg/dl, depending on what other risk factors you have for heart disease.  People with high blood triglycerides may also have high blood cholesterol levels. If you have high blood cholesterol as well as high blood triglycerides, your risk for heart disease is probably greater than if you only had high triglycerides. High blood cholesterol is one of the main risk factors for heart disease. CHANGING YOUR DIET  Your weight can affect your blood triglyceride level. If you are more than 20% above your ideal body weight, you may be able to lower your blood triglycerides by losing weight. Eating less and exercising regularly is the best way to combat this. Fat provides more  calories than any other food. The best way to lose weight is to eat less fat. Only 30% of your total calories should come from fat. Less than 7% of your diet should come from saturated fat. A diet low in fat and saturated fat is the same as a diet to decrease blood cholesterol. By eating a diet lower in fat, you may lose weight, lower your blood cholesterol, and lower your blood triglyceride level.  Eating a diet low in fat, especially saturated fat, may also help you lower your blood triglyceride level. Ask your dietitian to help you figure how much fat you can eat based on the number of calories your caregiver has prescribed for you.  Exercise, in addition to helping with weight loss may also help lower triglyceride levels.   Alcohol can increase blood triglycerides. You may need to stop drinking alcoholic beverages.  Too much carbohydrate in your diet may also increase your blood triglycerides. Some complex carbohydrates are necessary in your diet. These may include bread, rice, potatoes, other starchy vegetables and cereals.  Reduce "simple" carbohydrates. These may include pure sugars, candy, honey, and jelly without losing other nutrients. If you have the kind of high blood triglycerides that is affected by the amount of carbohydrates in your diet, you will need to eat less sugar and less high-sugar foods. Your caregiver can help you with this.  Adding 2-4 grams of fish oil (EPA+ DHA) may also help lower triglycerides. Speak with your caregiver before adding any supplements to your regimen. Following the Diet  Maintain your ideal weight. Your caregivers can help you with a diet. Generally, eating less food and getting more  exercise will help you lose weight. Joining a weight control group may also help. Ask your caregivers for a good weight control group in your area.  Eat low-fat foods instead of high-fat foods. This can help you lose weight too.  These foods are lower in fat. Eat MORE of these:    Dried beans, peas, and lentils.  Egg whites.  Low-fat cottage cheese.  Fish.  Lean cuts of meat, such as round, sirloin, rump, and flank (cut extra fat off meat you fix).  Whole grain breads, cereals and pasta.  Skim and nonfat dry milk.  Low-fat yogurt.  Poultry without the skin.  Cheese made with skim or part-skim milk, such as mozzarella, parmesan, farmers', ricotta, or pot cheese. These are higher fat foods. Eat LESS of these:   Whole milk and foods made from whole milk, such as American, blue, cheddar, monterey jack, and swiss cheese  High-fat meats, such as luncheon meats, sausages, knockwurst, bratwurst, hot dogs, ribs, corned beef, ground pork, and regular ground beef.  Fried foods. Limit saturated fats in your diet. Substituting unsaturated fat for saturated fat may decrease your blood triglyceride level. You will need to read package labels to know which products contain saturated fats.  These foods are high in saturated fat. Eat LESS of these:   Fried pork skins.  Whole milk.  Skin and fat from poultry.  Palm oil.  Butter.  Shortening.  Cream cheese.  Berniece Salines.  Margarines and baked goods made from listed oils.  Vegetable shortenings.  Chitterlings.  Fat from meats.  Coconut oil.  Palm kernel oil.  Lard.  Cream.  Sour cream.  Fatback.  Coffee whiteners and non-dairy creamers made with these oils.  Cheese made from whole milk. Use unsaturated fats (both polyunsaturated and monounsaturated) moderately. Remember, even though unsaturated fats are better than saturated fats; you still want a diet low in total fat.  These foods are high in unsaturated fat:   Canola oil.  Sunflower oil.  Mayonnaise.  Almonds.  Peanuts.  Pine nuts.  Margarines made with these oils.  Safflower oil.  Olive oil.  Avocados.  Cashews.  Peanut butter.  Sunflower seeds.  Soybean oil.  Peanut  oil.  Olives.  Pecans.  Walnuts.  Pumpkin seeds. Avoid sugar and other high-sugar foods. This will decrease carbohydrates without decreasing other nutrients. Sugar in your food goes rapidly to your blood. When there is excess sugar in your blood, your liver may use it to make more triglycerides. Sugar also contains calories without other important nutrients.  Eat LESS of these:   Sugar, brown sugar, powdered sugar, jam, jelly, preserves, honey, syrup, molasses, pies, candy, cakes, cookies, frosting, pastries, colas, soft drinks, punches, fruit drinks, and regular gelatin.  Avoid alcohol. Alcohol, even more than sugar, may increase blood triglycerides. In addition, alcohol is high in calories and low in nutrients. Ask for sparkling water, or a diet soft drink instead of an alcoholic beverage. Suggestions for planning and preparing meals   Bake, broil, grill or roast meats instead of frying.  Remove fat from meats and skin from poultry before cooking.  Add spices, herbs, lemon juice or vinegar to vegetables instead of salt, rich sauces or gravies.  Use a non-stick skillet without fat or use no-stick sprays.  Cool and refrigerate stews and broth. Then remove the hardened fat floating on the surface before serving.  Refrigerate meat drippings and skim off fat to make low-fat gravies.  Serve more fish.  Use less butter,  margarine and other high-fat spreads on bread or vegetables.  Use skim or reconstituted non-fat dry milk for cooking.  Cook with low-fat cheeses.  Substitute low-fat yogurt or cottage cheese for all or part of the sour cream in recipes for sauces, dips or congealed salads.  Use half yogurt/half mayonnaise in salad recipes.  Substitute evaporated skim milk for cream. Evaporated skim milk or reconstituted non-fat dry milk can be whipped and substituted for whipped cream in certain recipes.  Choose fresh fruits for dessert instead of high-fat foods such as pies or  cakes. Fruits are naturally low in fat. When Dining Out   Order low-fat appetizers such as fruit or vegetable juice, pasta with vegetables or tomato sauce.  Select clear, rather than cream soups.  Ask that dressings and gravies be served on the side. Then use less of them.  Order foods that are baked, broiled, poached, steamed, stir-fried, or roasted.  Ask for margarine instead of butter, and use only a small amount.  Drink sparkling water, unsweetened tea or coffee, or diet soft drinks instead of alcohol or other sweet beverages. QUESTIONS AND ANSWERS ABOUT OTHER FATS IN THE BLOOD: SATURATED FAT, TRANS FAT, AND CHOLESTEROL What is trans fat? Trans fat is a type of fat that is formed when vegetable oil is hardened through a process called hydrogenation. This process helps makes foods more solid, gives them shape, and prolongs their shelf life. Trans fats are also called hydrogenated or partially hydrogenated oils.  What do saturated fat, trans fat, and cholesterol in foods have to do with heart disease? Saturated fat, trans fat, and cholesterol in the diet all raise the level of LDL "bad" cholesterol in the blood. The higher the LDL cholesterol, the greater the risk for coronary heart disease (CHD). Saturated fat and trans fat raise LDL similarly.  What foods contain saturated fat, trans fat, and cholesterol? High amounts of saturated fat are found in animal products, such as fatty cuts of meat, chicken skin, and full-fat dairy products like butter, whole milk, cream, and cheese, and in tropical vegetable oils such as palm, palm kernel, and coconut oil. Trans fat is found in some of the same foods as saturated fat, such as vegetable shortening, some margarines (especially hard or stick margarine), crackers, cookies, baked goods, fried foods, salad dressings, and other processed foods made with partially hydrogenated vegetable oils. Small amounts of trans fat also occur naturally in some animal  products, such as milk products, beef, and lamb. Foods high in cholesterol include liver, other organ meats, egg yolks, shrimp, and full-fat dairy products. How can I use the new food label to make heart-healthy food choices? Check the Nutrition Facts panel of the food label. Choose foods lower in saturated fat, trans fat, and cholesterol. For saturated fat and cholesterol, you can also use the Percent Daily Value (%DV): 5% DV or less is low, and 20% DV or more is high. (There is no %DV for trans fat.) Use the Nutrition Facts panel to choose foods low in saturated fat and cholesterol, and if the trans fat is not listed, read the ingredients and limit products that list shortening or hydrogenated or partially hydrogenated vegetable oil, which tend to be high in trans fat. POINTS TO REMEMBER:   Discuss your risk for heart disease with your caregivers, and take steps to reduce risk factors.  Change your diet. Choose foods that are low in saturated fat, trans fat, and cholesterol.  Add exercise to your daily routine if  it is not already being done. Participate in physical activity of moderate intensity, like brisk walking, for at least 30 minutes on most, and preferably all days of the week. No time? Break the 30 minutes into three, 10-minute segments during the day.  Stop smoking. If you do smoke, contact your caregiver to discuss ways in which they can help you quit.  Do not use street drugs.  Maintain a normal weight.  Maintain a healthy blood pressure.  Keep up with your blood work for checking the fats in your blood as directed by your caregiver. Document Released: 05/16/2004 Document Revised: 01/28/2012 Document Reviewed: 12/12/2008 [facility] Patient Information 2013 [place].  Hypertension As your heart beats, it forces blood through your arteries. This force is your blood pressure. If the pressure is too high, it is called hypertension (HTN) or high blood pressure. HTN is dangerous  because you may have it and not know it. High blood pressure may mean that your heart has to work harder to pump blood. Your arteries may be narrow or stiff. The extra work puts you at risk for heart disease, stroke, and other problems.  Blood pressure consists of two numbers, a higher number over a lower, 110/72, for example. It is stated as "110 over 72." The ideal is below 120 for the top number (systolic) and under 80 for the bottom (diastolic). Write down your blood pressure today. You should pay close attention to your blood pressure if you have certain conditions such as:  Heart failure.  Prior heart attack.  Diabetes  Chronic kidney disease.  Prior stroke.  Multiple risk factors for heart disease. To see if you have HTN, your blood pressure should be measured while you are seated with your arm held at the level of the heart. It should be measured at least twice. A one-time elevated blood pressure reading (especially in the Emergency Department) does not mean that you need treatment. There may be conditions in which the blood pressure is different between your right and left arms. It is important to see your caregiver soon for a recheck. Most people have essential hypertension which means that there is not a specific cause. This type of high blood pressure may be lowered by changing lifestyle factors such as:  Stress.  Smoking.  Lack of exercise.  Excessive weight.  Drug/tobacco/alcohol use.  Eating less salt. Most people do not have symptoms from high blood pressure until it has caused damage to the body. Effective treatment can often prevent, delay or reduce that damage. TREATMENT  When a cause has been identified, treatment for high blood pressure is directed at the cause. There are a large number of medications to treat HTN. These fall into several categories, and your caregiver will help you select the medicines that are best for you. Medications may have side effects. You  should review side effects with your caregiver. If your blood pressure stays high after you have made lifestyle changes or started on medicines,   Your medication(s) may need to be changed.  Other problems may need to be addressed.  Be certain you understand your prescriptions, and know how and when to take your medicine.  Be sure to follow up with your caregiver within the time frame advised (usually within two weeks) to have your blood pressure rechecked and to review your medications.  If you are taking more than one medicine to lower your blood pressure, make sure you know how and at what times they should be  taken. Taking two medicines at the same time can result in blood pressure that is too low. SEEK IMMEDIATE MEDICAL CARE IF:  You develop a severe headache, blurred or changing vision, or confusion.  You have unusual weakness or numbness, or a faint feeling.  You have severe chest or abdominal pain, vomiting, or breathing problems. MAKE SURE YOU:   Understand these instructions.  Will watch your condition.  Will get help right away if you are not doing well or get worse. Document Released: 07/29/2005 Document Revised: 10/21/2011 Document Reviewed: 03/18/2008 [facility] Patient Information 2013 [place].  Diabetes and Exercise Regular exercise is important and can help:   Control blood glucose (sugar).  Decrease blood pressure.    Control blood lipids (cholesterol, triglycerides).  Improve overall health. BENEFITS FROM EXERCISE  Improved fitness.  Improved flexibility.  Improved endurance.  Increased bone density.  Weight control.  Increased muscle strength.  Decreased body fat.  Improvement of the body's use of insulin, a hormone.  Increased insulin sensitivity.  Reduction of insulin needs.  Reduced stress and tension.  Helps you feel better. People with diabetes who add exercise to their lifestyle gain additional benefits,  including:  Weight loss.  Reduced appetite.  Improvement of the body's use of blood glucose.  Decreased risk factors for heart disease:  Lowering of cholesterol and triglycerides.  Raising the level of good cholesterol (high-density lipoproteins, HDL).  Lowering blood sugar.  Decreased blood pressure. TYPE 1 DIABETES AND EXERCISE  Exercise will usually lower your blood glucose.  If blood glucose is greater than 240 mg/dl, check urine ketones. If ketones are present, do not exercise.  Location of the insulin injection sites may need to be adjusted with exercise. Avoid injecting insulin into areas of the body that will be exercised. For example, avoid injecting insulin into:  The arms when playing tennis.  The legs when jogging. For more information, discuss this with your caregiver.  Keep a record of:  Food intake.  Type and amount of exercise.  Expected peak times of insulin action.  Blood glucose levels. Do this before, during, and after exercise. Review your records with your caregiver. This will help you to develop guidelines for adjusting food intake and insulin amounts.  TYPE 2 DIABETES AND EXERCISE  Regular physical activity can help control blood glucose.  Exercise is important because it may:  Increase the body's sensitivity to insulin.  Improve blood glucose control.  Exercise reduces the risk of heart disease. It decreases serum cholesterol and triglycerides. It also lowers blood pressure.  Those who take insulin or oral hypoglycemic agents should watch for signs of hypoglycemia. These signs include dizziness, shaking, sweating, chills, and confusion.  Body water is lost during exercise. It must be replaced. This will help to avoid loss of body fluids (dehydration) or heat stroke. Be sure to talk to your caregiver before starting an exercise program to make sure it is safe for you. Remember, any activity is better than none.  Document Released:  10/19/2003 Document Revised: 10/21/2011 Document Reviewed: 02/02/2009 [facility] Patient Information 2013 [place].

## 2012-11-05 LAB — NMR LIPOPROFILE WITH LIPIDS
Cholesterol, Total: 162 mg/dL (ref <200)
HDL Particle Number: 37.8 umol/L (ref >=30.5)
HDL Size: 9.5 nm (ref >=9.2)
HDL-C: 62 mg/dL (ref >=40)
LDL (calc): 90 mg/dL (ref <100)
Large HDL-P: 10.2 umol/L (ref >=4.8)
Large VLDL-P: 1.6 nmol/L (ref <=2.7)

## 2012-11-06 ENCOUNTER — Other Ambulatory Visit: Payer: Self-pay | Admitting: General Practice

## 2012-11-06 DIAGNOSIS — E559 Vitamin D deficiency, unspecified: Secondary | ICD-10-CM

## 2012-11-06 MED ORDER — VITAMIN D3 1.25 MG (50000 UT) PO CAPS: 1.0000 | ORAL_CAPSULE | ORAL | Status: DC

## 2012-11-09 ENCOUNTER — Other Ambulatory Visit: Payer: Self-pay | Admitting: Family Medicine

## 2012-12-16 ENCOUNTER — Other Ambulatory Visit (INDEPENDENT_AMBULATORY_CARE_PROVIDER_SITE_OTHER): Payer: Medicare Other

## 2012-12-16 DIAGNOSIS — E559 Vitamin D deficiency, unspecified: Secondary | ICD-10-CM

## 2012-12-16 NOTE — Progress Notes (Signed)
Patient came in for labs only.

## 2013-01-18 ENCOUNTER — Telehealth: Payer: Self-pay | Admitting: General Practice

## 2013-01-18 DIAGNOSIS — E119 Type 2 diabetes mellitus without complications: Secondary | ICD-10-CM

## 2013-01-18 NOTE — Telephone Encounter (Signed)
LAST OV 11/04/12. RECEIVED FAX FOR LEVEMIR FLEXPEN TO INJECT 50 UNITS QD. LAST NOTE IN EPIC IS FOR 60U QD. PLEASE REVIEW.THANKS.

## 2013-01-18 NOTE — Telephone Encounter (Signed)
Pt now on 65 units levimer

## 2013-01-18 NOTE — Telephone Encounter (Signed)
CALLED PT AND LEFT MESSAGE TO CALL us. NEED TO VERIFY INSULIN DOSE AND IF WANTING LEVEMIR FLEXPEN OR VIAL.

## 2013-01-18 NOTE — Telephone Encounter (Signed)
Needs these meds asap- not due for appt until after 02/04/13. Please advise

## 2013-01-19 ENCOUNTER — Other Ambulatory Visit: Payer: Self-pay | Admitting: General Practice

## 2013-01-19 DIAGNOSIS — E785 Hyperlipidemia, unspecified: Secondary | ICD-10-CM

## 2013-01-19 DIAGNOSIS — E119 Type 2 diabetes mellitus without complications: Secondary | ICD-10-CM

## 2013-01-19 MED ORDER — INSULIN DETEMIR 100 UNIT/ML ~~LOC~~ SOLN: 60.0000 [IU] | Freq: Every day | SUBCUTANEOUS | Status: DC

## 2013-01-19 MED ORDER — ATORVASTATIN CALCIUM 40 MG PO TABS: 40.0000 mg | ORAL_TABLET | Freq: Every day | ORAL | Status: DC

## 2013-01-19 NOTE — Telephone Encounter (Signed)
SEE NOTES

## 2013-01-19 NOTE — Telephone Encounter (Signed)
Pt aware meds at [facility]

## 2013-01-19 NOTE — Telephone Encounter (Signed)
LMTCB

## 2013-01-19 NOTE — Telephone Encounter (Signed)
Please inform med scripts sent to [facility]. thx

## 2013-01-30 ENCOUNTER — Other Ambulatory Visit: Payer: Self-pay | Admitting: Family Medicine

## 2013-02-09 ENCOUNTER — Encounter: Payer: Self-pay | Admitting: General Practice

## 2013-02-09 ENCOUNTER — Ambulatory Visit (INDEPENDENT_AMBULATORY_CARE_PROVIDER_SITE_OTHER): Payer: Medicare Other | Admitting: General Practice

## 2013-02-09 VITALS — BP 121/76 | HR 78 | Temp 97.7°F | Ht 62.5 in | Wt 187.0 lb

## 2013-02-09 DIAGNOSIS — R7309 Other abnormal glucose: Secondary | ICD-10-CM

## 2013-02-09 DIAGNOSIS — E785 Hyperlipidemia, unspecified: Secondary | ICD-10-CM

## 2013-02-09 DIAGNOSIS — I10 Essential (primary) hypertension: Secondary | ICD-10-CM

## 2013-02-09 DIAGNOSIS — R209 Unspecified disturbances of skin sensation: Secondary | ICD-10-CM

## 2013-02-09 DIAGNOSIS — E119 Type 2 diabetes mellitus without complications: Secondary | ICD-10-CM

## 2013-02-09 DIAGNOSIS — Z09 Encounter for follow-up examination after completed treatment for conditions other than malignant neoplasm: Secondary | ICD-10-CM

## 2013-02-09 DIAGNOSIS — R739 Hyperglycemia, unspecified: Secondary | ICD-10-CM

## 2013-02-09 DIAGNOSIS — R202 Paresthesia of skin: Secondary | ICD-10-CM

## 2013-02-09 LAB — COMPLETE METABOLIC PANEL WITH GFR
BUN: 11 mg/dL (ref 6–23)
Calcium: 9.4 mg/dL (ref 8.4–10.5)
Chloride: 106 mEq/L (ref 96–112)
Creat: 0.62 mg/dL (ref 0.50–1.10)
GFR, Est Non African American: >89 mL/min
Potassium: 4.4 mEq/L (ref 3.5–5.3)
Sodium: 143 mEq/L (ref 135–145)

## 2013-02-09 LAB — POCT CBC
HCT, POC: 34 % — AB (ref 37.7–47.9)
Hemoglobin: 11.3 g/dL — AB (ref 12.2–16.2)
MPV: 7.2 fL (ref 0–99.8)
POC Granulocyte: 5.4 (ref 2–6.9)
Platelet Count, POC: 318 10*3/uL (ref 142–424)

## 2013-02-09 MED ORDER — ATORVASTATIN CALCIUM 40 MG PO TABS: 40.0000 mg | ORAL_TABLET | Freq: Every day | ORAL | Status: DC

## 2013-02-09 MED ORDER — LISINOPRIL 20 MG PO TABS: 20.0000 mg | ORAL_TABLET | Freq: Every day | ORAL | Status: DC

## 2013-02-09 MED ORDER — INSULIN DETEMIR 100 UNIT/ML FLEXPEN: 60.0000 [IU] | PEN_INJECTOR | Freq: Every day | SUBCUTANEOUS | Status: DC

## 2013-02-09 MED ORDER — INSULIN DETEMIR 100 UNIT/ML ~~LOC~~ SOLN: 60.0000 [IU] | Freq: Every day | SUBCUTANEOUS | Status: DC

## 2013-02-09 MED ORDER — GLIPIZIDE 10 MG PO TABS: 10.0000 mg | ORAL_TABLET | Freq: Every day | ORAL | Status: DC

## 2013-02-09 NOTE — Progress Notes (Addendum)
  Subjective:    Patient ID: Felicia Pugh, female    DOB: 07-Jun-1946, 67 y.o.   MRN: [id number]  HPI Presents today for 3 month follow up of chronic health conditions. She has a history of hyperglycemia, hypertension, and hyperlipidemia. She reports taking medications as directed. She reports taking blood sugars every am. Reports readings 70's. She denies regular exercise and reports difficulty eating heathy due to IBS.     Review of Systems  Constitutional: Negative for fever and chills.  HENT: Negative for neck pain and neck stiffness.   Respiratory: Negative for chest tightness and shortness of breath.   Cardiovascular: Negative for chest pain and palpitations.  Gastrointestinal: Negative for nausea, vomiting, abdominal pain, constipation and blood in stool.  Genitourinary: Negative for dysuria, hematuria and difficulty urinating.  Musculoskeletal: Positive for back pain.       Chronic back pain  Neurological: Negative for dizziness, weakness and headaches.       Objective:   Physical Exam  Constitutional: She is oriented to person, place, and time. She appears well-developed and well-nourished.  HENT:  Head: Normocephalic and atraumatic.  Right Ear: External ear normal.  Left Ear: External ear normal.  Mouth/Throat: Oropharynx is clear and moist.  Neck: Normal range of motion. Neck supple. No thyromegaly present.  Cardiovascular: Normal rate, regular rhythm and normal heart sounds.   Pulmonary/Chest: Effort normal and breath sounds normal. No respiratory distress. She exhibits no tenderness.  Abdominal: Soft. Bowel sounds are normal. She exhibits no distension. There is no tenderness.  Neurological: She is alert and oriented to person, place, and time.  Skin: Skin is warm and dry.  Psychiatric: She has a normal mood and affect.          Assessment & Plan:  1. Other and unspecified hyperlipidemia - NMR Lipoprofile with Lipids - atorvastatin (LIPITOR) 40 MG tablet; Take  1 tablet (40 mg total) by mouth daily.  Dispense: 90 tablet; Refill: 0  2. Hyperglycemia - POCT glycosylated hemoglobin (Hb A1C)  3. Essential hypertension, benign - POCT CBC - COMPLETE METABOLIC PANEL WITH GFR  4. Follow-up exam, 3-6 months since previous exam - Thyroid Panel With TSH - Vitamin D 25 hydroxy  5. Paresthesia - Vitamin B12  6. Accelerated hypertension - lisinopril (PRINIVIL,ZESTRIL) 20 MG tablet; Take 1 tablet (20 mg total) by mouth daily.  Dispense: 90 tablet; Refill: 0  7. Diabetes - glipiZIDE (GLUCOTROL) 10 MG tablet; Take 1 tablet (10 mg total) by mouth daily.  Dispense: 90 tablet; Refill: 0 - Insulin Detemir (LEVEMIR FLEXPEN) 100 UNIT/ML SOPN; Inject 60 Units into the skin at bedtime.  Dispense: 1 pen; Refill: 3 -Continue all current medications Labs pending F/u in 3 months or sooner if symptoms develop Discussed exercise and healthy eating Patient verbalized understanding Erby Pian, FNP-C

## 2013-02-09 NOTE — Patient Instructions (Signed)
Hypertriglyceridemia  Diet for High blood levels of Triglycerides Most fats in food are triglycerides. Triglycerides in your blood are stored as fat in your body. High levels of triglycerides in your blood may put you at a greater risk for heart disease and stroke.  Normal triglyceride levels are less than 150 mg/dL. Borderline high levels are 150-199 mg/dl. High levels are 200 - 499 mg/dL, and very high triglyceride levels are greater than 500 mg/dL. The decision to treat high triglycerides is generally based on the level. For people with borderline or high triglyceride levels, treatment includes weight loss and exercise. Drugs are recommended for people with very high triglyceride levels. Many people who need treatment for high triglyceride levels have metabolic syndrome. This syndrome is a collection of disorders that often include: insulin resistance, high blood pressure, blood clotting problems, high cholesterol and triglycerides. TESTING PROCEDURE FOR TRIGLYCERIDES  You should not eat 4 hours before getting your triglycerides measured. The normal range of triglycerides is between 10 and 250 milligrams per deciliter (mg/dl). Some people may have extreme levels (1000 or above), but your triglyceride level may be too high if it is above 150 mg/dl, depending on what other risk factors you have for heart disease.  People with high blood triglycerides may also have high blood cholesterol levels. If you have high blood cholesterol as well as high blood triglycerides, your risk for heart disease is probably greater than if you only had high triglycerides. High blood cholesterol is one of the main risk factors for heart disease. CHANGING YOUR DIET  Your weight can affect your blood triglyceride level. If you are more than 20% above your ideal body weight, you may be able to lower your blood triglycerides by losing weight. Eating less and exercising regularly is the best way to combat this. Fat provides more  calories than any other food. The best way to lose weight is to eat less fat. Only 30% of your total calories should come from fat. Less than 7% of your diet should come from saturated fat. A diet low in fat and saturated fat is the same as a diet to decrease blood cholesterol. By eating a diet lower in fat, you may lose weight, lower your blood cholesterol, and lower your blood triglyceride level.  Eating a diet low in fat, especially saturated fat, may also help you lower your blood triglyceride level. Ask your dietitian to help you figure how much fat you can eat based on the number of calories your caregiver has prescribed for you.  Exercise, in addition to helping with weight loss may also help lower triglyceride levels.   Alcohol can increase blood triglycerides. You may need to stop drinking alcoholic beverages.  Too much carbohydrate in your diet may also increase your blood triglycerides. Some complex carbohydrates are necessary in your diet. These may include bread, rice, potatoes, other starchy vegetables and cereals.  Reduce "simple" carbohydrates. These may include pure sugars, candy, honey, and jelly without losing other nutrients. If you have the kind of high blood triglycerides that is affected by the amount of carbohydrates in your diet, you will need to eat less sugar and less high-sugar foods. Your caregiver can help you with this.  Adding 2-4 grams of fish oil (EPA+ DHA) may also help lower triglycerides. Speak with your caregiver before adding any supplements to your regimen. Following the Diet  Maintain your ideal weight. Your caregivers can help you with a diet. Generally, eating less food and getting more   exercise will help you lose weight. Joining a weight control group may also help. Ask your caregivers for a good weight control group in your area.  Eat low-fat foods instead of high-fat foods. This can help you lose weight too.  These foods are lower in fat. Eat MORE of these:    Dried beans, peas, and lentils.  Egg whites.  Low-fat cottage cheese.  Fish.  Lean cuts of meat, such as round, sirloin, rump, and flank (cut extra fat off meat you fix).  Whole grain breads, cereals and pasta.  Skim and nonfat dry milk.  Low-fat yogurt.  Poultry without the skin.  Cheese made with skim or part-skim milk, such as mozzarella, parmesan, farmers', ricotta, or pot cheese. These are higher fat foods. Eat LESS of these:   Whole milk and foods made from whole milk, such as American, blue, cheddar, monterey jack, and swiss cheese  High-fat meats, such as luncheon meats, sausages, knockwurst, bratwurst, hot dogs, ribs, corned beef, ground pork, and regular ground beef.  Fried foods. Limit saturated fats in your diet. Substituting unsaturated fat for saturated fat may decrease your blood triglyceride level. You will need to read package labels to know which products contain saturated fats.  These foods are high in saturated fat. Eat LESS of these:   Fried pork skins.  Whole milk.  Skin and fat from poultry.  Palm oil.  Butter.  Shortening.  Cream cheese.  Bacon.  Margarines and baked goods made from listed oils.  Vegetable shortenings.  Chitterlings.  Fat from meats.  Coconut oil.  Palm kernel oil.  Lard.  Cream.  Sour cream.  Fatback.  Coffee whiteners and non-dairy creamers made with these oils.  Cheese made from whole milk. Use unsaturated fats (both polyunsaturated and monounsaturated) moderately. Remember, even though unsaturated fats are better than saturated fats; you still want a diet low in total fat.  These foods are high in unsaturated fat:   Canola oil.  Sunflower oil.  Mayonnaise.  Almonds.  Peanuts.  Pine nuts.  Margarines made with these oils.  Safflower oil.  Olive oil.  Avocados.  Cashews.  Peanut butter.  Sunflower seeds.  Soybean oil.  Peanut  oil.  Olives.  Pecans.  Walnuts.  Pumpkin seeds. Avoid sugar and other high-sugar foods. This will decrease carbohydrates without decreasing other nutrients. Sugar in your food goes rapidly to your blood. When there is excess sugar in your blood, your liver may use it to make more triglycerides. Sugar also contains calories without other important nutrients.  Eat LESS of these:   Sugar, brown sugar, powdered sugar, jam, jelly, preserves, honey, syrup, molasses, pies, candy, cakes, cookies, frosting, pastries, colas, soft drinks, punches, fruit drinks, and regular gelatin.  Avoid alcohol. Alcohol, even more than sugar, may increase blood triglycerides. In addition, alcohol is high in calories and low in nutrients. Ask for sparkling water, or a diet soft drink instead of an alcoholic beverage. Suggestions for planning and preparing meals   Bake, broil, grill or roast meats instead of frying.  Remove fat from meats and skin from poultry before cooking.  Add spices, herbs, lemon juice or vinegar to vegetables instead of salt, rich sauces or gravies.  Use a non-stick skillet without fat or use no-stick sprays.  Cool and refrigerate stews and broth. Then remove the hardened fat floating on the surface before serving.  Refrigerate meat drippings and skim off fat to make low-fat gravies.  Serve more fish.  Use less butter,   margarine and other high-fat spreads on bread or vegetables.  Use skim or reconstituted non-fat dry milk for cooking.  Cook with low-fat cheeses.  Substitute low-fat yogurt or cottage cheese for all or part of the sour cream in recipes for sauces, dips or congealed salads.  Use half yogurt/half mayonnaise in salad recipes.  Substitute evaporated skim milk for cream. Evaporated skim milk or reconstituted non-fat dry milk can be whipped and substituted for whipped cream in certain recipes.  Choose fresh fruits for dessert instead of high-fat foods such as pies or  cakes. Fruits are naturally low in fat. When Dining Out   Order low-fat appetizers such as fruit or vegetable juice, pasta with vegetables or tomato sauce.  Select clear, rather than cream soups.  Ask that dressings and gravies be served on the side. Then use less of them.  Order foods that are baked, broiled, poached, steamed, stir-fried, or roasted.  Ask for margarine instead of butter, and use only a small amount.  Drink sparkling water, unsweetened tea or coffee, or diet soft drinks instead of alcohol or other sweet beverages. QUESTIONS AND ANSWERS ABOUT OTHER FATS IN THE BLOOD: SATURATED FAT, TRANS FAT, AND CHOLESTEROL What is trans fat? Trans fat is a type of fat that is formed when vegetable oil is hardened through a process called hydrogenation. This process helps makes foods more solid, gives them shape, and prolongs their shelf life. Trans fats are also called hydrogenated or partially hydrogenated oils.  What do saturated fat, trans fat, and cholesterol in foods have to do with heart disease? Saturated fat, trans fat, and cholesterol in the diet all raise the level of LDL "bad" cholesterol in the blood. The higher the LDL cholesterol, the greater the risk for coronary heart disease (CHD). Saturated fat and trans fat raise LDL similarly.  What foods contain saturated fat, trans fat, and cholesterol? High amounts of saturated fat are found in animal products, such as fatty cuts of meat, chicken skin, and full-fat dairy products like butter, whole milk, cream, and cheese, and in tropical vegetable oils such as palm, palm kernel, and coconut oil. Trans fat is found in some of the same foods as saturated fat, such as vegetable shortening, some margarines (especially hard or stick margarine), crackers, cookies, baked goods, fried foods, salad dressings, and other processed foods made with partially hydrogenated vegetable oils. Small amounts of trans fat also occur naturally in some animal  products, such as milk products, beef, and lamb. Foods high in cholesterol include liver, other organ meats, egg yolks, shrimp, and full-fat dairy products. How can I use the new food label to make heart-healthy food choices? Check the Nutrition Facts panel of the food label. Choose foods lower in saturated fat, trans fat, and cholesterol. For saturated fat and cholesterol, you can also use the Percent Daily Value (%DV): 5% DV or less is low, and 20% DV or more is high. (There is no %DV for trans fat.) Use the Nutrition Facts panel to choose foods low in saturated fat and cholesterol, and if the trans fat is not listed, read the ingredients and limit products that list shortening or hydrogenated or partially hydrogenated vegetable oil, which tend to be high in trans fat. POINTS TO REMEMBER:   Discuss your risk for heart disease with your caregivers, and take steps to reduce risk factors.  Change your diet. Choose foods that are low in saturated fat, trans fat, and cholesterol.  Add exercise to your daily routine if   it is not already being done. Participate in physical activity of moderate intensity, like brisk walking, for at least 30 minutes on most, and preferably all days of the week. No time? Break the 30 minutes into three, 10-minute segments during the day.  Stop smoking. If you do smoke, contact your caregiver to discuss ways in which they can help you quit.  Do not use street drugs.  Maintain a normal weight.  Maintain a healthy blood pressure.  Keep up with your blood work for checking the fats in your blood as directed by your caregiver. Document Released: 05/16/2004 Document Revised: 01/28/2012 Document Reviewed: 12/12/2008 [employer] Patient Information 2014 [employer].  

## 2013-02-10 LAB — NMR LIPOPROFILE WITH LIPIDS
Cholesterol, Total: 181 mg/dL (ref <200)
HDL Particle Number: 42.6 umol/L (ref >=30.5)
HDL-C: 62 mg/dL (ref >=40)
LDL Particle Number: 1108 nmol/L — ABNORMAL HIGH (ref <1000)
LP-IR Score: 29 (ref <=45)
Large VLDL-P: 1.2 nmol/L (ref <=2.7)
Small LDL Particle Number: 328 nmol/L (ref <=527)
VLDL Size: 43.8 nm (ref <=46.6)

## 2013-02-17 ENCOUNTER — Other Ambulatory Visit: Payer: Self-pay | Admitting: Orthopaedic Surgery

## 2013-02-17 DIAGNOSIS — M545 Low back pain, unspecified: Secondary | ICD-10-CM

## 2013-02-24 ENCOUNTER — Ambulatory Visit
Admission: RE | Admit: 2013-02-24 | Discharge: 2013-02-24 | Disposition: A | Discharge status: Home / Self Care | Payer: No Typology Code available for payment source | Source: Ambulatory Visit | Attending: Orthopaedic Surgery | Admitting: Orthopaedic Surgery

## 2013-02-24 DIAGNOSIS — M545 Low back pain, unspecified: Secondary | ICD-10-CM

## 2013-03-16 ENCOUNTER — Ambulatory Visit: Payer: Medicare Other | Attending: Rehabilitation | Admitting: Physical Therapy

## 2013-03-16 DIAGNOSIS — M545 Low back pain, unspecified: Secondary | ICD-10-CM | POA: Insufficient documentation

## 2013-03-16 DIAGNOSIS — R5381 Other malaise: Secondary | ICD-10-CM | POA: Insufficient documentation

## 2013-03-16 DIAGNOSIS — IMO0001 Reserved for inherently not codable concepts without codable children: Secondary | ICD-10-CM | POA: Insufficient documentation

## 2013-03-18 ENCOUNTER — Ambulatory Visit: Payer: Medicare Other | Admitting: Physical Therapy

## 2013-03-23 ENCOUNTER — Ambulatory Visit: Payer: Medicare Other | Admitting: Physical Therapy

## 2013-03-25 ENCOUNTER — Ambulatory Visit: Payer: Medicare Other | Admitting: Physical Therapy

## 2013-03-30 ENCOUNTER — Ambulatory Visit: Payer: Medicare Other | Admitting: Physical Therapy

## 2013-04-06 ENCOUNTER — Ambulatory Visit: Payer: Medicare Other | Admitting: Physical Therapy

## 2013-04-08 ENCOUNTER — Ambulatory Visit: Payer: Medicare Other | Admitting: Physical Therapy

## 2013-04-14 ENCOUNTER — Ambulatory Visit: Payer: Medicare Other | Attending: Rehabilitation | Admitting: Physical Therapy

## 2013-04-14 DIAGNOSIS — M545 Low back pain, unspecified: Secondary | ICD-10-CM | POA: Insufficient documentation

## 2013-04-14 DIAGNOSIS — R5381 Other malaise: Secondary | ICD-10-CM | POA: Insufficient documentation

## 2013-04-14 DIAGNOSIS — IMO0001 Reserved for inherently not codable concepts without codable children: Secondary | ICD-10-CM | POA: Insufficient documentation

## 2013-04-20 ENCOUNTER — Encounter: Payer: No Typology Code available for payment source | Admitting: *Deleted

## 2013-04-22 ENCOUNTER — Encounter: Payer: No Typology Code available for payment source | Admitting: *Deleted

## 2013-04-23 ENCOUNTER — Other Ambulatory Visit: Payer: Self-pay

## 2013-04-23 DIAGNOSIS — I10 Essential (primary) hypertension: Secondary | ICD-10-CM

## 2013-04-23 DIAGNOSIS — E785 Hyperlipidemia, unspecified: Secondary | ICD-10-CM

## 2013-04-23 MED ORDER — ATORVASTATIN CALCIUM 40 MG PO TABS: 40.0000 mg | ORAL_TABLET | Freq: Every day | ORAL | Status: DC

## 2013-04-23 NOTE — Telephone Encounter (Signed)
Last seen 02/09/13  Mae  If approved print for mail order and route to nurse

## 2013-05-13 ENCOUNTER — Ambulatory Visit (INDEPENDENT_AMBULATORY_CARE_PROVIDER_SITE_OTHER): Payer: Medicare Other | Admitting: General Practice

## 2013-05-13 ENCOUNTER — Encounter: Payer: Self-pay | Admitting: General Practice

## 2013-05-13 VITALS — BP 145/80 | HR 84 | Temp 97.1°F | Ht 62.5 in | Wt 184.5 lb

## 2013-05-13 DIAGNOSIS — E785 Hyperlipidemia, unspecified: Secondary | ICD-10-CM

## 2013-05-13 DIAGNOSIS — I10 Essential (primary) hypertension: Secondary | ICD-10-CM

## 2013-05-13 DIAGNOSIS — Z23 Encounter for immunization: Secondary | ICD-10-CM

## 2013-05-13 DIAGNOSIS — E119 Type 2 diabetes mellitus without complications: Secondary | ICD-10-CM

## 2013-05-13 DIAGNOSIS — Z09 Encounter for follow-up examination after completed treatment for conditions other than malignant neoplasm: Secondary | ICD-10-CM

## 2013-05-13 LAB — POCT CBC
Granulocyte percent: 65.7 %G (ref 37–80)
Lymph, poc: 2.6 (ref 0.6–3.4)
MCH, POC: 26.9 pg — AB (ref 27–31.2)
MCV: 83.7 fL (ref 80–97)
Platelet Count, POC: 346 10*3/uL (ref 142–424)
RDW, POC: 13.9 %
WBC: 8.9 10*3/uL (ref 4.6–10.2)

## 2013-05-13 MED ORDER — GLIPIZIDE 10 MG PO TABS: 10.0000 mg | ORAL_TABLET | Freq: Every day | ORAL | Status: DC

## 2013-05-13 MED ORDER — LISINOPRIL 20 MG PO TABS: 20.0000 mg | ORAL_TABLET | Freq: Every day | ORAL | Status: DC

## 2013-05-13 NOTE — Progress Notes (Signed)
  Subjective:    Patient ID: Felicia Pugh, female    DOB: Sep 17, 1945, 67 y.o.   MRN: [id number]  HPI Patient presents today for 3 month chronic health check. She has a history of diabetes type II, hypertension, hyperlipidemia and IBS. She reports checking blood sugars daily and range 90-100. Denies checking blood pressures at home. Reports taking medications at prescribed.  Reports having steroid injection in right hip on Monday by Dr. Maia Petties, [facility]. Reports pain level greatly decreased.   Review of Systems  Constitutional: Negative for fever and chills.  HENT: Negative for neck pain and neck stiffness.   Respiratory: Negative for chest tightness and shortness of breath.   Cardiovascular: Negative for chest pain and palpitations.  Gastrointestinal: Negative for nausea, vomiting, abdominal pain, constipation and blood in stool.  Genitourinary: Negative for dysuria, hematuria and difficulty urinating.  Musculoskeletal: Positive for back pain.       Chronic back pain  Neurological: Negative for dizziness, weakness and headaches.       Objective:   Physical Exam  Constitutional: She is oriented to person, place, and time. She appears well-developed and well-nourished.  HENT:  Head: Normocephalic and atraumatic.  Right Ear: External ear normal.  Left Ear: External ear normal.  Mouth/Throat: Oropharynx is clear and moist.  Eyes: EOM are normal. Pupils are equal, round, and reactive to light.  Neck: Normal range of motion. Neck supple. No thyromegaly present.  Cardiovascular: Normal rate, regular rhythm and normal heart sounds.   Pulmonary/Chest: Effort normal and breath sounds normal. No respiratory distress. She exhibits no tenderness.  Abdominal: Soft. Bowel sounds are normal. She exhibits no distension. There is no tenderness.  Neurological: She is alert and oriented to person, place, and time.  Skin: Skin is warm and dry.  Psychiatric: She has a normal mood and affect.           Assessment & Plan:  1. Hypertension  - CMP14+EGFR  2. Diabetes  - POCT glycosylated hemoglobin (Hb A1C) - glipiZIDE (GLUCOTROL) 10 MG tablet; Take 1 tablet (10 mg total) by mouth daily.  Dispense: 90 tablet; Refill: 1  3. Other and unspecified hyperlipidemia  - NMR, lipoprofile  4. Follow-up exam, 3-6 months since previous exam  - POCT CBC  5. Accelerated hypertension  - lisinopril (PRINIVIL,ZESTRIL) 20 MG tablet; Take 1 tablet (20 mg total) by mouth daily.  Dispense: 90 tablet; Refill: 1 Continue all current medications Labs pending F/u in 3 months and prn Discussed exercise and diet  Patient verbalized understanding Erby Pian, FNP-C

## 2013-05-13 NOTE — Patient Instructions (Signed)
Mammogram Tips Healthy women should begin getting mammograms every year or two once they reach age 67, and once a year when they reach age 5. Here are tips:  Find an experienced, high-volume center with accomplished radiologists. You can ask for their credentials.  Ask to see the certificate showing the center is approved by the [place] Food and Drug Administration.  Use the same center regularly, so it is easier to compare your new mammograms with your old ones.  Bring a list of places you have had mammograms, dates, biopsies or other breast treatments. Bring old mammograms with you or have them sent to your primary caregiver.  Describe any breast problems to your caregiver or the person doing the mammogram. Be ready to give past surgeries, birth control pills, hormone use, breast implants, growths, moles, breast scars and family or personal history of breast cancer.  Call your doctor or center to check on the mammogram if you hear nothing within 10 days. Do not assume everything was normal.  To protect your privacy, the mammogram results cannot be given over the phone or to anyone but you.  Radiation from a mammogram is very low and does not pose a radiation risk.  Mammograms can detect breast problems other than breast cancer.  You may be asked stand or sit in front of the X-ray machine.  Two small plastic or glass plates are placed around the breast when taking the X-ray.  If you are menstruating, schedule your mammogram a week after your menstrual period.  Do not wear deodorants, powder or perfume when getting a mammogram.  Wash your breasts and under your arms before getting a mammogram.  Wear cloths that are easy for you to undress and dress.  Arrive at the center at least 15 minutes before the mammogram is scheduled.  There may be slight discomfort during the mammogram, but it goes away shortly after the test.  Try to relax as much as possible during the mammogram.  Talk  to your caregiver if you do not understand the results of the mammogram.  Follow the recommendations of your caregiver regarding further tests and treatments if needed.  Get a second opinion if you are concerned or question the results of the mammogram, further tests or treatment if needed.  Continue with monthly self-breast exams and yearly caregiver exams even if the mammogram is normal.  Your caregiver may recommend getting a mammogram before age 75 and more often if you are at high risk for developing breast cancer. Document Released: 11/14/2005 Document Revised: 10/21/2011 Document Reviewed: 07/22/2008 [facility] Patient Information 2014 [employer].

## 2013-05-15 LAB — NMR, LIPOPROFILE
HDL Cholesterol by NMR: 63 mg/dL (ref >=40)
LDL Particle Number: 1141 nmol/L — ABNORMAL HIGH (ref <1000)
LDL Size: 20.6 nm (ref >20.5)
LDLC SERPL CALC-MCNC: 88 mg/dL (ref <100)
Triglycerides by NMR: 79 mg/dL (ref <150)

## 2013-05-15 LAB — CMP14+EGFR
ALT: 15 IU/L (ref 0–32)
AST: 16 IU/L (ref 0–40)
Albumin/Globulin Ratio: 1.9 (ref 1.1–2.5)
CO2: 28 mmol/L (ref 18–29)
Calcium: 9.5 mg/dL (ref 8.6–10.2)
Chloride: 104 mmol/L (ref 97–108)
GFR calc non Af Amer: 91 mL/min/{1.73_m2} (ref >59)
Glucose: 98 mg/dL (ref 65–99)
Potassium: 4.1 mmol/L (ref 3.5–5.2)
Total Bilirubin: 0.2 mg/dL (ref 0.0–1.2)
Total Protein: 6.6 g/dL (ref 6.0–8.5)

## 2013-05-18 ENCOUNTER — Telehealth: Payer: Self-pay | Admitting: General Practice

## 2013-05-18 NOTE — Telephone Encounter (Signed)
Patient states that she is out of her lisinopril and that she is waiting for her mail order rx to arrive. I advised that we could call in rx to local pharmacy for #[phone]. Patient agreed to have called in the [facility] in [place]. Also requesting samples of Levemir. Samples given

## 2013-06-25 ENCOUNTER — Telehealth: Payer: Self-pay | Admitting: General Practice

## 2013-06-25 NOTE — Telephone Encounter (Signed)
No samples will you right an RX

## 2013-06-26 ENCOUNTER — Other Ambulatory Visit: Payer: Self-pay | Admitting: General Practice

## 2013-06-26 DIAGNOSIS — E119 Type 2 diabetes mellitus without complications: Secondary | ICD-10-CM

## 2013-06-26 MED ORDER — INSULIN DETEMIR 100 UNIT/ML FLEXPEN: 60.0000 [IU] | PEN_INJECTOR | Freq: Every day | SUBCUTANEOUS | Status: DC

## 2013-06-26 NOTE — Telephone Encounter (Signed)
Script sent to pharmacy, in case samples unavailable.

## 2013-06-28 NOTE — Telephone Encounter (Signed)
None available

## 2013-08-13 ENCOUNTER — Encounter: Payer: Self-pay | Admitting: General Practice

## 2013-08-13 ENCOUNTER — Ambulatory Visit (INDEPENDENT_AMBULATORY_CARE_PROVIDER_SITE_OTHER): Payer: Medicare HMO | Admitting: General Practice

## 2013-08-13 VITALS — BP 149/86 | HR 90 | Temp 97.6°F | Ht 62.5 in | Wt 186.5 lb

## 2013-08-13 DIAGNOSIS — E785 Hyperlipidemia, unspecified: Secondary | ICD-10-CM

## 2013-08-13 DIAGNOSIS — N63 Unspecified lump in unspecified breast: Secondary | ICD-10-CM

## 2013-08-13 DIAGNOSIS — I10 Essential (primary) hypertension: Secondary | ICD-10-CM

## 2013-08-13 DIAGNOSIS — N6310 Unspecified lump in the right breast, unspecified quadrant: Secondary | ICD-10-CM

## 2013-08-13 DIAGNOSIS — E109 Type 1 diabetes mellitus without complications: Secondary | ICD-10-CM

## 2013-08-13 DIAGNOSIS — E119 Type 2 diabetes mellitus without complications: Secondary | ICD-10-CM

## 2013-08-13 LAB — POCT GLYCOSYLATED HEMOGLOBIN (HGB A1C): HEMOGLOBIN A1C: 8.8

## 2013-08-13 MED ORDER — INSULIN DETEMIR 100 UNIT/ML FLEXPEN: 65.0000 [IU] | PEN_INJECTOR | Freq: Every day | SUBCUTANEOUS | Status: DC

## 2013-08-13 MED ORDER — ATORVASTATIN CALCIUM 40 MG PO TABS: 40.0000 mg | ORAL_TABLET | Freq: Every day | ORAL | Status: DC

## 2013-08-13 MED ORDER — GLIPIZIDE 10 MG PO TABS: 10.0000 mg | ORAL_TABLET | Freq: Every day | ORAL | Status: DC

## 2013-08-13 MED ORDER — LISINOPRIL 20 MG PO TABS: 20.0000 mg | ORAL_TABLET | Freq: Every day | ORAL | Status: DC

## 2013-08-13 NOTE — Progress Notes (Signed)
   Subjective:    Patient ID: Felicia Pugh, female    DOB: 1946/01/17, 68 y.o.   MRN: [id number]  HPI Patient presents today for 3 month chronic health check. She has a history of diabetes type II, hypertension, hyperlipidemia and IBS. She reports checking blood sugars every morning, ranges 80-90. Denies checking blood pressures at home. Reports taking medications at prescribed.  Reports noticing a tender lump in right breast, onset 1-2 months ago. Reports she is due for mammogram, but hasn't scheduled it.      Review of Systems  Constitutional: Negative for fever and chills.  Respiratory: Negative for chest tightness and shortness of breath.   Cardiovascular: Negative for chest pain and palpitations.  Skin:       Right breast lump  All other systems reviewed and are negative.       Objective:   Physical Exam  Constitutional: She is oriented to person, place, and time. She appears well-developed and well-nourished.  HENT:  Head: Normocephalic and atraumatic.  Right Ear: External ear normal.  Left Ear: External ear normal.  Eyes: Pupils are equal, round, and reactive to light.  Neck: Normal range of motion. Neck supple. No thyromegaly present.  Cardiovascular: Normal rate, regular rhythm and normal heart sounds.   Pulmonary/Chest: Effort normal and breath sounds normal. No respiratory distress. She exhibits no tenderness.    Right breast firm, immobile mass palpated at 12 o'clock. Tenderness noted.   Abdominal: Soft. Bowel sounds are normal. She exhibits no distension. There is no tenderness.  Lymphadenopathy:    She has no cervical adenopathy.  Neurological: She is alert and oriented to person, place, and time.  Skin: Skin is warm and dry.  Psychiatric: She has a normal mood and affect.          Assessment & Plan:  1. Diabetes mellitus type 1  - POCT glycosylated hemoglobin (Hb A1C) - Insulin Detemir (LEVEMIR FLEXPEN) 100 UNIT/ML Pen; Inject 65 Units into the skin  daily at 10 pm.  Dispense: 7 pen; Refill: 3  2. Hypertension  - CMP14+EGFR  3. Hyperlipidemia  - NMR, lipoprofile  4. Accelerated hypertension  - lisinopril (PRINIVIL,ZESTRIL) 20 MG tablet; Take 1 tablet (20 mg total) by mouth daily.  Dispense: 90 tablet; Refill: 1  5. Diabetes  - glipiZIDE (GLUCOTROL) 10 MG tablet; Take 1 tablet (10 mg total) by mouth daily.  Dispense: 90 tablet; Refill: 1  6. Other and unspecified hyperlipidemia  - atorvastatin (LIPITOR) 40 MG tablet; Take 1 tablet (40 mg total) by mouth daily.  Dispense: 90 tablet; Refill: 1  7. Lump of right breast  - MM Digital Diagnostic Unilat R; Future -Continue all current medications Labs pending F/u in 3 months Discussed benefits of regular exercise and healthy eating Patient verbalized understanding Erby Pian, FNP-C

## 2013-08-13 NOTE — Patient Instructions (Signed)

## 2013-08-15 LAB — CMP14+EGFR
A/G RATIO: 1.8 (ref 1.1–2.5)
ALK PHOS: 151 IU/L — AB (ref 39–117)
ALT: 17 IU/L (ref 0–32)
AST: 17 IU/L (ref 0–40)
Albumin: 4.4 g/dL (ref 3.6–4.8)
BUN / CREAT RATIO: 21 (ref 11–26)
BUN: 14 mg/dL (ref 8–27)
CALCIUM: 9.9 mg/dL (ref 8.6–10.2)
CO2: 23 mmol/L (ref 18–29)
CREATININE: 0.66 mg/dL (ref 0.57–1.00)
Chloride: 103 mmol/L (ref 97–108)
GFR calc Af Amer: 106 mL/min/{1.73_m2} (ref >59)
GFR, EST NON AFRICAN AMERICAN: 92 mL/min/{1.73_m2} (ref >59)
GLOBULIN, TOTAL: 2.4 g/dL (ref 1.5–4.5)
Glucose: 59 mg/dL — ABNORMAL LOW (ref 65–99)
POTASSIUM: 4.4 mmol/L (ref 3.5–5.2)
Sodium: 145 mmol/L — ABNORMAL HIGH (ref 134–144)
Total Bilirubin: 0.4 mg/dL (ref 0.0–1.2)
Total Protein: 6.8 g/dL (ref 6.0–8.5)

## 2013-08-15 LAB — NMR, LIPOPROFILE
Cholesterol: 175 mg/dL (ref <200)
HDL CHOLESTEROL BY NMR: 78 mg/dL (ref >=40)
HDL PARTICLE NUMBER: 40.6 umol/L (ref >=30.5)
LDL Particle Number: 814 nmol/L (ref <1000)
LDL SIZE: 21 nm (ref >20.5)
LDLC SERPL CALC-MCNC: 86 mg/dL (ref <100)
Triglycerides by NMR: 56 mg/dL (ref <150)

## 2013-08-15 LAB — SPECIMEN STATUS REPORT

## 2013-08-17 ENCOUNTER — Other Ambulatory Visit: Payer: Self-pay | Admitting: General Practice

## 2013-08-17 ENCOUNTER — Encounter: Payer: Self-pay | Admitting: *Deleted

## 2013-08-17 ENCOUNTER — Other Ambulatory Visit: Payer: Self-pay | Admitting: Family Medicine

## 2013-08-18 ENCOUNTER — Other Ambulatory Visit: Payer: Self-pay | Admitting: General Practice

## 2013-08-18 DIAGNOSIS — N6310 Unspecified lump in the right breast, unspecified quadrant: Secondary | ICD-10-CM

## 2013-08-18 NOTE — Telephone Encounter (Signed)
Patient inquiring about a referral that was suppose to be done for a lump in her breast.  Sent ?  Patient ran out of insulin and could not afford to buy more last month. She feels like like BS will get back on track with medication. Kathrin Penner aware.

## 2013-08-20 NOTE — Telephone Encounter (Signed)
Aware. 

## 2013-08-25 ENCOUNTER — Other Ambulatory Visit: Payer: Self-pay | Admitting: Family Medicine

## 2013-08-26 ENCOUNTER — Ambulatory Visit
Admission: RE | Admit: 2013-08-26 | Discharge: 2013-08-26 | Disposition: A | Discharge status: Home / Self Care | Payer: Medicare HMO | Source: Ambulatory Visit | Attending: General Practice | Admitting: General Practice

## 2013-08-26 DIAGNOSIS — N6310 Unspecified lump in the right breast, unspecified quadrant: Secondary | ICD-10-CM

## 2013-08-27 ENCOUNTER — Telehealth: Payer: Self-pay | Admitting: General Practice

## 2013-08-27 MED ORDER — GLUCOSE BLOOD VI STRP: ORAL_STRIP | Status: DC

## 2013-08-27 NOTE — Telephone Encounter (Signed)
done

## 2013-10-20 ENCOUNTER — Telehealth: Payer: Self-pay | Admitting: General Practice

## 2013-10-22 ENCOUNTER — Other Ambulatory Visit: Payer: Self-pay | Admitting: General Practice

## 2013-10-22 NOTE — Telephone Encounter (Signed)
Please inform patient we have levemir pen and vial available for her. She should make appointment with Tammy to discuss more affordable medication or may have information on assistance program.

## 2013-10-22 NOTE — Telephone Encounter (Signed)
Patient aware that samples are available. The prescription was written correctly for the amount of insulin she is to take but the pharmacy did not dispense enough pens to cover the prescribed amount.   She is going to f/u with them about that.

## 2013-11-12 ENCOUNTER — Ambulatory Visit: Payer: Medicare HMO | Admitting: General Practice

## 2013-11-15 ENCOUNTER — Ambulatory Visit (INDEPENDENT_AMBULATORY_CARE_PROVIDER_SITE_OTHER): Payer: Medicare HMO | Admitting: General Practice

## 2013-11-15 ENCOUNTER — Encounter: Payer: Self-pay | Admitting: General Practice

## 2013-11-15 VITALS — BP 159/88 | HR 88 | Temp 97.0°F | Ht 62.5 in | Wt 187.8 lb

## 2013-11-15 DIAGNOSIS — I10 Essential (primary) hypertension: Secondary | ICD-10-CM

## 2013-11-15 DIAGNOSIS — E785 Hyperlipidemia, unspecified: Secondary | ICD-10-CM

## 2013-11-15 DIAGNOSIS — E109 Type 1 diabetes mellitus without complications: Secondary | ICD-10-CM

## 2013-11-15 LAB — POCT GLYCOSYLATED HEMOGLOBIN (HGB A1C): Hemoglobin A1C: 8.6

## 2013-11-15 MED ORDER — GLUCOSE BLOOD VI STRP: ORAL_STRIP | Status: DC

## 2013-11-15 MED ORDER — LISINOPRIL 20 MG PO TABS: 20.0000 mg | ORAL_TABLET | Freq: Every day | ORAL | Status: DC

## 2013-11-15 MED ORDER — ATORVASTATIN CALCIUM 40 MG PO TABS: 40.0000 mg | ORAL_TABLET | Freq: Every day | ORAL | Status: DC

## 2013-11-15 MED ORDER — GLIPIZIDE 10 MG PO TABS: 10.0000 mg | ORAL_TABLET | Freq: Every day | ORAL | Status: DC

## 2013-11-15 MED ORDER — INSULIN DETEMIR 100 UNIT/ML FLEXPEN: 65.0000 [IU] | PEN_INJECTOR | Freq: Every day | SUBCUTANEOUS | Status: DC

## 2013-11-15 NOTE — Patient Instructions (Signed)

## 2013-11-15 NOTE — Progress Notes (Signed)
   Subjective:    Patient ID: Felicia Pugh, female    DOB: [birth year], 68 y.o.   MRN: [id number]  HPI Patient presents today for 3 month chronic health check. History of diabetes type II, hypertension, hyperlipidemia and IBS. Checking blood sugars every morning, ranges 80-90. Denies checking blood pressures at home. Taking medications at prescribed.       Review of Systems  Constitutional: Negative for fever and chills.  Respiratory: Negative for chest tightness and shortness of breath.   Cardiovascular: Negative for chest pain and palpitations.  All other systems reviewed and are negative.       Objective:   Physical Exam  Constitutional: She is oriented to person, place, and time. She appears well-developed and well-nourished.  HENT:  Head: Normocephalic and atraumatic.  Right Ear: External ear normal.  Left Ear: External ear normal.  Eyes: EOM are normal. Pupils are equal, round, and reactive to light.  Neck: Normal range of motion. Neck supple. No thyromegaly present.  Cardiovascular: Normal rate, regular rhythm and normal heart sounds.   Pulmonary/Chest: Effort normal and breath sounds normal. No respiratory distress. She exhibits no tenderness.  Abdominal: Soft. Bowel sounds are normal. She exhibits no distension. There is no tenderness.  Lymphadenopathy:    She has no cervical adenopathy.  Neurological: She is alert and oriented to person, place, and time.  Skin: Skin is warm and dry.  Psychiatric: She has a normal mood and affect.          Assessment & Plan:  1. Diabetes mellitus type 1  - POCT glycosylated hemoglobin (Hb A1C) - Insulin Detemir (LEVEMIR FLEXPEN) 100 UNIT/ML Pen; Inject 65 Units into the skin daily at 10 pm.  Dispense: 7 pen; Refill: 4 - glucose blood (ONE TOUCH ULTRA TEST) test strip; Test Sugar once daily Dx 250.00  Dispense: 100 each; Refill: 11 - glipiZIDE (GLUCOTROL) 10 MG tablet; Take 1 tablet (10 mg total) by mouth daily.  Dispense: 90  tablet; Refill: 1  2. Other and unspecified hyperlipidemia  - Lipid panel - atorvastatin (LIPITOR) 40 MG tablet; Take 1 tablet (40 mg total) by mouth daily.  Dispense: 90 tablet; Refill: 1  3. Hypertension  - CMP14+EGFR - lisinopril (PRINIVIL,ZESTRIL) 20 MG tablet; Take 1 tablet (20 mg total) by mouth daily.  Dispense: 90 tablet; Refill: 1 -Continue all current medications Labs pending F/u in 3 months Discussed benefits of regular exercise and healthy eating Patient verbalized understanding Erby Pian, FNP-C

## 2013-11-16 ENCOUNTER — Encounter: Payer: Self-pay | Admitting: Pharmacist Clinician (PhC)/ Clinical Pharmacy Specialist

## 2013-11-16 ENCOUNTER — Ambulatory Visit (INDEPENDENT_AMBULATORY_CARE_PROVIDER_SITE_OTHER): Payer: Medicare HMO | Admitting: Pharmacist Clinician (PhC)/ Clinical Pharmacy Specialist

## 2013-11-16 DIAGNOSIS — E109 Type 1 diabetes mellitus without complications: Secondary | ICD-10-CM

## 2013-11-16 DIAGNOSIS — IMO0002 Reserved for concepts with insufficient information to code with codable children: Secondary | ICD-10-CM

## 2013-11-16 DIAGNOSIS — E118 Type 2 diabetes mellitus with unspecified complications: Principal | ICD-10-CM

## 2013-11-16 DIAGNOSIS — E1165 Type 2 diabetes mellitus with hyperglycemia: Secondary | ICD-10-CM

## 2013-11-16 LAB — CMP14+EGFR
A/G RATIO: 1.8 (ref 1.1–2.5)
ALBUMIN: 4.3 g/dL (ref 3.6–4.8)
ALT: 10 IU/L (ref 0–32)
AST: 17 IU/L (ref 0–40)
Alkaline Phosphatase: 153 IU/L — ABNORMAL HIGH (ref 39–117)
BUN/Creatinine Ratio: 23 (ref 11–26)
BUN: 14 mg/dL (ref 8–27)
CALCIUM: 9.4 mg/dL (ref 8.7–10.3)
CO2: 25 mmol/L (ref 18–29)
CREATININE: 0.6 mg/dL (ref 0.57–1.00)
Chloride: 106 mmol/L (ref 97–108)
GFR calc Af Amer: 109 mL/min/{1.73_m2} (ref >59)
GFR calc non Af Amer: 95 mL/min/{1.73_m2} (ref >59)
Globulin, Total: 2.4 g/dL (ref 1.5–4.5)
Glucose: 67 mg/dL (ref 65–99)
Potassium: 4.1 mmol/L (ref 3.5–5.2)
Sodium: 146 mmol/L — ABNORMAL HIGH (ref 134–144)
TOTAL PROTEIN: 6.7 g/dL (ref 6.0–8.5)
Total Bilirubin: 0.3 mg/dL (ref 0.0–1.2)

## 2013-11-16 LAB — LIPID PANEL
Chol/HDL Ratio: 2.1 ratio units (ref 0.0–4.4)
Cholesterol, Total: 159 mg/dL (ref 100–199)
HDL: 75 mg/dL (ref >39)
LDL Calculated: 74 mg/dL (ref 0–99)
TRIGLYCERIDES: 52 mg/dL (ref 0–149)
VLDL Cholesterol Cal: 10 mg/dL (ref 5–40)

## 2013-11-16 MED ORDER — INSULIN DETEMIR 100 UNIT/ML FLEXPEN: 55.0000 [IU] | PEN_INJECTOR | Freq: Every day | SUBCUTANEOUS | Status: DC

## 2013-11-16 MED ORDER — CANAGLIFLOZIN-METFORMIN HCL 50-500 MG PO TABS: 1.0000 | ORAL_TABLET | Freq: Two times a day (BID) | ORAL | Status: DC

## 2013-11-16 NOTE — Progress Notes (Signed)
   Subjective:    Patient ID: Felicia Pugh, female    DOB: 08/04/1946, 68 y.o.   MRN: [id number]  HPI    Review of Systems  Constitutional: Negative.   HENT: Negative.   Cardiovascular: Negative.   Endocrine: Negative.   Skin: Negative.   Psychiatric/Behavioral: Negative.        Objective:   Physical Exam  Constitutional: She is oriented to person, place, and time. She appears well-developed and well-nourished.  Cardiovascular: Normal rate and regular rhythm.   Neurological: She is alert and oriented to person, place, and time.  Skin: Skin is dry.          Assessment & Plan:   Diabetes Follow-Up Visit Chief Complaint:  No chief complaint on file.    Exam Regularity:  RRR  Edema:  neg  Polyuria:  neg  Polydipsia:  neg Polyphagia:  neg  BMI:  There is no weight on file to calculate BMI.   Weight changes:  Increased since starting Lememir General Appearance:  alert, oriented, no acute distress, well nourished and obese Mood/Affect:  normal  HPI:  Diagnosed with type 2 diabetes 15-16 years ago.  Took Glucotrol initially then Januvia and metformin.  She had diarrhea with metformin, however she does have IBS and was not certain she took metofmrin with food.  She stopped metformin 4-5 years ago and stated Levemir 3-4 years ago and is taking 65 units at bedtime and has multiple episodes of hypoglycemia despite an A1C of 8.6%. She can not afford Levemir.  Low fat/carbohydrate diet?  No Nicotine Abuse?  No Medication Compliance?  Yes Exercise?  No Alcohol Abuse?  No  Home BG Monitoring:  Checking 1 times a day. Average:  170  High: 200  Low:  50   Lab Results  Component Value Date   HGBA1C 8.6% 11/15/2013    No results found for this basenameDerl Barrow    Lab Results  Component Value Date   CHOL 175 08/13/2013   HDL 75 11/15/2013   LDLCALC 74 11/15/2013   TRIG 52 11/15/2013   CHOLHDL 2.1 11/15/2013      Assessment: 1.  Diabetes.  :  Less than adequate  control.  Goal A1C is <7% 2.  Blood Pressure.  Sodium level is elevated, pateint has a high salt diet 3.  Lipids.  Excellent control 4.  Foot Care.  Reviewed daily care 5.  Dental Care.  annual 6.  Eye Care/Exam.  Annual exam  Recommendations: 1.  Patient is counseled on appropriate foot care. 2.  BP goal < 130/80. 3.  LDL goal of < 100, HDL > 40 and TG < 150. 4.  Eye Exam yearly and Dental Exam every 6 months. 5.  Dietary recommendations:  1700 calorie ADA diet was counseled on extensively and given writen handouts and diet. 6.  Physical Activity recommendations:  Will do chair exercises 8-10 times a day 7.  Medication recommendations at this time are as follows:  Decrease Levemir to 55 units a day and add  INvokonamet 50-533m bid after a full meal.  Patient to keep a food diary and BG log and call every couple of days with results and to get further instructions on decreasing insulin. 8.  Return to clinic in 4-6 wks   Time spent counseling patient:  45 min  Physician time spent with patient:    Referring provider:  [place]   PharmD:  [facility]

## 2013-11-20 ENCOUNTER — Emergency Department (HOSPITAL_COMMUNITY)
Admission: EM | Admit: 2013-11-20 | Discharge: 2013-11-21 | Disposition: A | Discharge status: Home / Self Care | Payer: Medicare HMO | Attending: Emergency Medicine | Admitting: Emergency Medicine

## 2013-11-20 ENCOUNTER — Encounter (HOSPITAL_COMMUNITY): Payer: Self-pay | Admitting: Emergency Medicine

## 2013-11-20 ENCOUNTER — Emergency Department (HOSPITAL_COMMUNITY): Payer: Medicare HMO

## 2013-11-20 DIAGNOSIS — I10 Essential (primary) hypertension: Secondary | ICD-10-CM | POA: Insufficient documentation

## 2013-11-20 DIAGNOSIS — E1142 Type 2 diabetes mellitus with diabetic polyneuropathy: Secondary | ICD-10-CM | POA: Insufficient documentation

## 2013-11-20 DIAGNOSIS — R197 Diarrhea, unspecified: Secondary | ICD-10-CM | POA: Insufficient documentation

## 2013-11-20 DIAGNOSIS — R42 Dizziness and giddiness: Secondary | ICD-10-CM

## 2013-11-20 DIAGNOSIS — T383X5A Adverse effect of insulin and oral hypoglycemic [antidiabetic] drugs, initial encounter: Secondary | ICD-10-CM | POA: Insufficient documentation

## 2013-11-20 DIAGNOSIS — Z872 Personal history of diseases of the skin and subcutaneous tissue: Secondary | ICD-10-CM | POA: Insufficient documentation

## 2013-11-20 DIAGNOSIS — Z79899 Other long term (current) drug therapy: Secondary | ICD-10-CM | POA: Insufficient documentation

## 2013-11-20 DIAGNOSIS — E1149 Type 2 diabetes mellitus with other diabetic neurological complication: Secondary | ICD-10-CM | POA: Insufficient documentation

## 2013-11-20 DIAGNOSIS — Z862 Personal history of diseases of the blood and blood-forming organs and certain disorders involving the immune mechanism: Secondary | ICD-10-CM | POA: Insufficient documentation

## 2013-11-20 DIAGNOSIS — T887XXA Unspecified adverse effect of drug or medicament, initial encounter: Secondary | ICD-10-CM

## 2013-11-20 DIAGNOSIS — Z794 Long term (current) use of insulin: Secondary | ICD-10-CM | POA: Insufficient documentation

## 2013-11-20 DIAGNOSIS — Z8719 Personal history of other diseases of the digestive system: Secondary | ICD-10-CM | POA: Insufficient documentation

## 2013-11-20 LAB — CBC WITH DIFFERENTIAL/PLATELET
Basophils Absolute: 0 10*3/uL (ref 0.0–0.1)
Basophils Relative: 0 % (ref 0–1)
Eosinophils Absolute: 0.1 10*3/uL (ref 0.0–0.7)
Eosinophils Relative: 1 % (ref 0–5)
HCT: 38.5 % (ref 36.0–46.0)
Hemoglobin: 12.1 g/dL (ref 12.0–15.0)
Lymphocytes Relative: 33 % (ref 12–46)
Lymphs Abs: 3.2 10*3/uL (ref 0.7–4.0)
MCH: 27.6 pg (ref 26.0–34.0)
MCHC: 31.4 g/dL (ref 30.0–36.0)
MCV: 87.9 fL (ref 78.0–100.0)
Monocytes Absolute: 0.6 10*3/uL (ref 0.1–1.0)
Monocytes Relative: 6 % (ref 3–12)
Neutro Abs: 5.8 10*3/uL (ref 1.7–7.7)
Neutrophils Relative %: 60 % (ref 43–77)
Platelets: 337 10*3/uL (ref 150–400)
RBC: 4.38 MIL/uL (ref 3.87–5.11)
RDW: 13.4 % (ref 11.5–15.5)
WBC: 9.7 10*3/uL (ref 4.0–10.5)

## 2013-11-20 LAB — BASIC METABOLIC PANEL
BUN: 18 mg/dL (ref 6–23)
CO2: 30 mEq/L (ref 19–32)
Calcium: 9.9 mg/dL (ref 8.4–10.5)
Chloride: 106 mEq/L (ref 96–112)
Creatinine, Ser: 0.75 mg/dL (ref 0.50–1.10)
GFR calc Af Amer: >90 mL/min (ref >90)
GFR calc non Af Amer: 86 mL/min — ABNORMAL LOW (ref >90)
Glucose, Bld: 121 mg/dL — ABNORMAL HIGH (ref 70–99)
Potassium: 4.2 mEq/L (ref 3.7–5.3)
Sodium: 147 mEq/L (ref 137–147)

## 2013-11-20 MED ORDER — LORAZEPAM 2 MG/ML IJ SOLN
0.5000 mg | Freq: Once | INTRAMUSCULAR | Status: AC
  Administered 2013-11-20: 0.5 mg via INTRAVENOUS
  Filled 2013-11-20: qty 1

## 2013-11-20 MED ORDER — MECLIZINE HCL 12.5 MG PO TABS
25.0000 mg | ORAL_TABLET | Freq: Once | ORAL | Status: AC
  Administered 2013-11-20: 25 mg via ORAL
  Filled 2013-11-20: qty 2

## 2013-11-20 MED ORDER — ONDANSETRON HCL 4 MG/2ML IJ SOLN
4.0000 mg | Freq: Once | INTRAMUSCULAR | Status: AC
  Administered 2013-11-20: 4 mg via INTRAMUSCULAR
  Filled 2013-11-20: qty 2

## 2013-11-20 MED ORDER — SODIUM CHLORIDE 0.9 % IV BOLUS (SEPSIS)
1000.0000 mL | Freq: Once | INTRAVENOUS | Status: AC
  Administered 2013-11-20: 1000 mL via INTRAVENOUS

## 2013-11-20 NOTE — ED Notes (Addendum)
Pt c/o dizziness/diarrhea/n/tiredness since starting new diabetic medication (invokamet) on Thursday.

## 2013-11-21 LAB — URINALYSIS, ROUTINE W REFLEX MICROSCOPIC
Bilirubin Urine: NEGATIVE
Glucose, UA: 500 mg/dL — AB
Hgb urine dipstick: NEGATIVE
Ketones, ur: 15 mg/dL — AB
LEUKOCYTES UA: NEGATIVE
Nitrite: NEGATIVE
PROTEIN: NEGATIVE mg/dL
Specific Gravity, Urine: >1.03 — ABNORMAL HIGH (ref 1.005–1.030)
UROBILINOGEN UA: 0.2 mg/dL (ref 0.0–1.0)
pH: 5.5 (ref 5.0–8.0)

## 2013-11-21 MED ORDER — ONDANSETRON HCL 8 MG PO TABS: 8.0000 mg | ORAL_TABLET | Freq: Three times a day (TID) | ORAL | Status: DC | PRN

## 2013-11-21 MED ORDER — ACETAMINOPHEN 325 MG PO TABS
650.0000 mg | ORAL_TABLET | Freq: Once | ORAL | Status: AC
  Administered 2013-11-21: 650 mg via ORAL
  Filled 2013-11-21: qty 2

## 2013-11-21 MED ORDER — LORAZEPAM 0.5 MG PO TABS: 0.5000 mg | ORAL_TABLET | Freq: Three times a day (TID) | ORAL | Status: DC | PRN

## 2013-11-21 MED ORDER — MECLIZINE HCL 25 MG PO TABS: 25.0000 mg | ORAL_TABLET | Freq: Three times a day (TID) | ORAL | Status: DC | PRN

## 2013-11-21 NOTE — Discharge Instructions (Signed)
Benign Positional Vertigo Vertigo means you feel like you or your surroundings are moving when they are not. Benign positional vertigo is the most common form of vertigo. Benign means that the cause of your condition is not serious. Benign positional vertigo is more common in older adults. CAUSES  Benign positional vertigo is the result of an upset in the labyrinth system. This is an area in the middle ear that helps control your balance. This may be caused by a viral infection, head injury, or repetitive motion. However, often no specific cause is found. SYMPTOMS  Symptoms of benign positional vertigo occur when you move your head or eyes in different directions. Some of the symptoms may include:  Loss of balance and falls.  Vomiting.  Blurred vision.  Dizziness.  Nausea.  Involuntary eye movements (nystagmus). DIAGNOSIS  Benign positional vertigo is usually diagnosed by physical exam. If the specific cause of your benign positional vertigo is unknown, your caregiver may perform imaging tests, such as magnetic resonance imaging (MRI) or computed tomography (CT). TREATMENT  Your caregiver may recommend movements or procedures to correct the benign positional vertigo. Medicines such as meclizine, benzodiazepines, and medicines for nausea may be used to treat your symptoms. In rare cases, if your symptoms are caused by certain conditions that affect the inner ear, you may need surgery. HOME CARE INSTRUCTIONS   Follow your caregiver's instructions.  Move slowly. Do not make sudden body or head movements.  Avoid driving.  Avoid operating heavy machinery.  Avoid performing any tasks that would be dangerous to you or others during a vertigo episode.  Drink enough fluids to keep your urine clear or pale yellow. SEEK IMMEDIATE MEDICAL CARE IF:   You develop problems with walking, weakness, numbness, or using your arms, hands, or legs.  You have difficulty speaking.  You develop  severe headaches.  Your nausea or vomiting continues or gets worse.  You develop visual changes.  Your family or friends notice any behavioral changes.  Your condition gets worse.  You have a fever.  You develop a stiff neck or sensitivity to light. MAKE SURE YOU:   Understand these instructions.  Will watch your condition.  Will get help right away if you are not doing well or get worse. Document Released: 05/06/2006 Document Revised: 10/21/2011 Document Reviewed: 04/18/2011 [facility] Patient Information 2014 [place].   Use the medicines prescribed if needed for continued dizziness. Rest, use caution with sudden positional changes which can worsen your symptoms.  These medicines can also make you sleepy - use caution with this.  You have also been prescribed zofran if needed for nausea if this symptom continues.  Additionally, I recommend holding your new Invokamet medication as I suspect the metformin in this medicine is causing your diarrhea.  Also, morning cbg's as low at 40 is dangerous as you know.  Please call your doctor on Monday to advise of these side effects and for advice on how to proceed with your diabetes management as you attempt to get off insulin.

## 2013-11-22 NOTE — ED Provider Notes (Signed)
CSN: [id number]     Arrival date & time 11/20/13  4128 History   First MD Initiated Contact with Patient 11/20/13 2125     Chief Complaint  Patient presents with  . Headache     (Consider location/radiation/quality/duration/timing/severity/associated sxs/prior Treatment) HPI Comments: Felicia Pugh is a 68 y.o. Female presenting with non bloody diarrhea, mild generalized headache, fatigue and dizziness for the past 2 days.  She is trying to get off of her insulin and was started on Invokamet 2 days ago.  She has a past history of having bad diarrhea as a side effect of metformin,but was willing to try this new medication anyway.  She reports having over 8 episodes of diarrhea today.  She has generalized fatigue and also is waking with fasting cbg's in the 40's since adding the new medicine, despite cutting back her Levemir dosing.  She denies fevers, chills, nausea, vomiting and abdominal pain but does endorse generalized fatigue and headache.  She also has complaint of vertigo type symptoms which she has had in the past, denies room spinning, but has a brief "movement of the room" with positional changes.  She reports similar symptoms in the past.  She denies focal weakness or vision changes.   The history is provided by the patient.    Past Medical History  Diagnosis Date  . Diabetes mellitus without complication   . IBS (irritable bowel syndrome)   . Anemia   . Ulcer   . Diabetic neuropathy   . Hypertension    Past Surgical History  Procedure Laterality Date  . Abdominal hysterectomy      partial  . Back surgery     Family History  Problem Relation Age of Onset  . Hypertension Mother   . Stroke Mother   . Diabetes Father   . Cancer Sister   . Diabetes Brother   . Diabetes Sister   . Cancer Sister    History  Substance Use Topics  . Smoking status: Never Smoker   . Smokeless tobacco: Not on file  . Alcohol Use: Not on file   OB History   Grav Para Term Preterm  Abortions TAB SAB Ect Mult Living                 Review of Systems  Constitutional: Negative for fever.  HENT: Negative for congestion and sore throat.   Eyes: Negative.  Negative for photophobia and visual disturbance.  Respiratory: Negative for chest tightness and shortness of breath.   Cardiovascular: Negative for chest pain.  Gastrointestinal: Positive for diarrhea. Negative for nausea, vomiting and abdominal pain.  Genitourinary: Negative.   Musculoskeletal: Negative for arthralgias, joint swelling and neck pain.  Skin: Negative.  Negative for rash and wound.  Neurological: Positive for dizziness and headaches. Negative for weakness, light-headedness and numbness.  Psychiatric/Behavioral: Negative.       Allergies  Asa and Nsaids  Home Medications   Current Outpatient Rx  Name  Route  Sig  Dispense  Refill  . atorvastatin (LIPITOR) 40 MG tablet   Oral   Take 1 tablet (40 mg total) by mouth daily.   90 tablet   1   . Canagliflozin-Metformin HCl (INVOKAMET) 50-500 MG TABS   Oral   Take 1 tablet by mouth 2 (two) times daily after a meal.   28 tablet   0   . glipiZIDE (GLUCOTROL) 10 MG tablet   Oral   Take 1 tablet (10 mg total) by mouth daily.  90 tablet   1   . Insulin Detemir (LEVEMIR FLEXPEN) 100 UNIT/ML Pen   Subcutaneous   Inject 55 Units into the skin daily at 10 pm.   7 pen   4   . lisinopril (PRINIVIL,ZESTRIL) 20 MG tablet   Oral   Take 1 tablet (20 mg total) by mouth daily.   90 tablet   1   . LORazepam (ATIVAN) 0.5 MG tablet   Oral   Take 1 tablet (0.5 mg total) by mouth every 8 (eight) hours as needed (dizziness).   15 tablet   0   . meclizine (ANTIVERT) 25 MG tablet   Oral   Take 1 tablet (25 mg total) by mouth 3 (three) times daily as needed for dizziness.   30 tablet   0   . ondansetron (ZOFRAN) 8 MG tablet   Oral   Take 1 tablet (8 mg total) by mouth every 8 (eight) hours as needed for nausea or vomiting.   12 tablet   0     BP 135/65  Pulse 74  Temp(Src) 97.9 F (36.6 C)  Resp 18  Ht 5' 2"  (1.575 m)  Wt 197 lb (89.359 kg)  BMI 36.02 kg/m2  SpO2 96% Physical Exam  Nursing note and vitals reviewed. Constitutional: She is oriented to person, place, and time. She appears well-developed and well-nourished.  HENT:  Head: Normocephalic and atraumatic.  Mouth/Throat: Oropharynx is clear and moist.  Eyes: EOM are normal. Pupils are equal, round, and reactive to light. Right eye exhibits no nystagmus. Left eye exhibits no nystagmus.  Neck: Normal range of motion and full passive range of motion without pain. Neck supple. No spinous process tenderness and no muscular tenderness present. Carotid bruit is not present.  Cardiovascular: Normal rate, regular rhythm and normal heart sounds.   Pulmonary/Chest: Effort normal.  Abdominal: Soft. Bowel sounds are normal. There is no tenderness. There is no guarding.  Musculoskeletal: Normal range of motion.  Lymphadenopathy:    She has no cervical adenopathy.  Neurological: She is alert and oriented to person, place, and time. She has normal strength. No cranial nerve deficit or sensory deficit. Gait normal. GCS eye subscore is 4. GCS verbal subscore is 5. GCS motor subscore is 6.  Normal heel-shin, normal rapid alternating movements. Cranial nerves III-XII intact.  No pronator drift.  Equal grip strength.  Skin: Skin is warm and dry. No rash noted.  Psychiatric: She has a normal mood and affect. Her speech is normal and behavior is normal. Thought content normal. Cognition and memory are normal.    ED Course  Procedures (including critical care time) Labs Review Labs Reviewed  BASIC METABOLIC PANEL - Abnormal; Notable for the following:    Glucose, Bld 121 (*)    GFR calc non Af Amer 86 (*)    All other components within normal limits  URINALYSIS, ROUTINE W REFLEX MICROSCOPIC - Abnormal; Notable for the following:    Specific Gravity, Urine >1.030 (*)    Glucose,  UA 500 (*)    Ketones, ur 15 (*)    All other components within normal limits  CBC WITH DIFFERENTIAL   Imaging Review Ct Head Wo Contrast  11/20/2013   CLINICAL DATA:  Hypertension. Left side mini stroke. Complains of headache.  EXAM: CT HEAD WITHOUT CONTRAST  TECHNIQUE: Contiguous axial images were obtained from the base of the skull through the vertex without intravenous contrast.  COMPARISON:  None.  FINDINGS: There is mild patchy subcortical and  periventricular white matter hypodensity. No acute cortical infarct, hemorrhage, or mass lesion ispresent. Ventricles are of normal size. No significant extra-axial fluid collection is present. The paranasal sinuses andmastoid air cells are clear. The osseous skull is intact.  IMPRESSION: 1. No acute intracranial abnormalities. 2. Mild small vessel ischemic change.   Electronically Signed   By: Kerby Moors M.D.   On: 11/20/2013 23:40     EKG Interpretation None      MDM   Final diagnoses:  Vertigo  Medication side effect    Patients labs and/or radiological studies were viewed and considered during the medical decision making and disposition process. Pt was advised to stop taking her metformin until she has spoken with her pcp regarding this side effect.  She was given meclizine and ativan PO with resolution of vertigo like sx.  Ct head without acute findings,  Exam without neuro deficit.  Pt has history of similar sx, appears stable at time of dc. She had no diarrhea while in dept.  The patient appears reasonably screened and/or stabilized for discharge and I doubt any other medical condition or other [facility] requiring further screening, evaluation, or treatment in the ED at this time prior to discharge.     Evalee Jefferson, PA-C 11/22/13 1300

## 2013-11-24 ENCOUNTER — Telehealth: Payer: Self-pay | Admitting: Pharmacist

## 2013-11-24 MED ORDER — CANAGLIFLOZIN 100 MG PO TABS: 100.0000 mg | ORAL_TABLET | ORAL | Status: DC

## 2013-11-24 NOTE — Telephone Encounter (Signed)
Patient was started on Invokamet 50/576m 1 bid by MMemory Argue PharmD, CPP last week.  She calls today to report that she cannot tolerate side effect of diarrhea.   I recommended stop Invokamet and start Invokana 1068m1 tablet daily.  She will call in 1-2 weeks to discuss with MiSharyn LullG results and if she is tolerating Invokana.  I recommend rechecking BMET in 1 month.

## 2013-11-25 NOTE — ED Provider Notes (Signed)
  Medical screening examination/treatment/procedure(s) were performed by non-physician practitioner and as supervising physician I was immediately available for consultation/collaboration.   EKG Interpretation None       Virgel Manifold, MD 11/25/13 1404

## 2014-01-04 ENCOUNTER — Encounter: Payer: Self-pay | Admitting: *Deleted

## 2014-01-07 ENCOUNTER — Encounter (HOSPITAL_COMMUNITY): Payer: Self-pay | Admitting: Emergency Medicine

## 2014-01-07 ENCOUNTER — Emergency Department (HOSPITAL_COMMUNITY): Payer: Medicare HMO

## 2014-01-07 ENCOUNTER — Telehealth: Payer: Self-pay | Admitting: *Deleted

## 2014-01-07 ENCOUNTER — Emergency Department (HOSPITAL_COMMUNITY)
Admission: EM | Admit: 2014-01-07 | Discharge: 2014-01-07 | Disposition: A | Discharge status: Home / Self Care | Payer: Medicare HMO | Attending: Emergency Medicine | Admitting: Emergency Medicine

## 2014-01-07 DIAGNOSIS — I10 Essential (primary) hypertension: Secondary | ICD-10-CM | POA: Insufficient documentation

## 2014-01-07 DIAGNOSIS — Z872 Personal history of diseases of the skin and subcutaneous tissue: Secondary | ICD-10-CM | POA: Insufficient documentation

## 2014-01-07 DIAGNOSIS — Z794 Long term (current) use of insulin: Secondary | ICD-10-CM | POA: Insufficient documentation

## 2014-01-07 DIAGNOSIS — Z8719 Personal history of other diseases of the digestive system: Secondary | ICD-10-CM | POA: Insufficient documentation

## 2014-01-07 DIAGNOSIS — E1142 Type 2 diabetes mellitus with diabetic polyneuropathy: Secondary | ICD-10-CM | POA: Insufficient documentation

## 2014-01-07 DIAGNOSIS — R071 Chest pain on breathing: Secondary | ICD-10-CM | POA: Insufficient documentation

## 2014-01-07 DIAGNOSIS — R0789 Other chest pain: Secondary | ICD-10-CM

## 2014-01-07 DIAGNOSIS — Z862 Personal history of diseases of the blood and blood-forming organs and certain disorders involving the immune mechanism: Secondary | ICD-10-CM | POA: Insufficient documentation

## 2014-01-07 DIAGNOSIS — Z79899 Other long term (current) drug therapy: Secondary | ICD-10-CM | POA: Insufficient documentation

## 2014-01-07 DIAGNOSIS — E1149 Type 2 diabetes mellitus with other diabetic neurological complication: Secondary | ICD-10-CM | POA: Insufficient documentation

## 2014-01-07 MED ORDER — TRAMADOL HCL 50 MG PO TABS: 50.0000 mg | ORAL_TABLET | Freq: Four times a day (QID) | ORAL | Status: DC | PRN

## 2014-01-07 NOTE — Discharge Instructions (Signed)
Follow up with your md next week for recheck

## 2014-01-07 NOTE — ED Provider Notes (Signed)
CSN: [id number]     Arrival date & time 01/07/14  1742 History   First MD Initiated Contact with Patient 01/07/14 1824     Chief Complaint  Patient presents with  . Neck Pain     (Consider location/radiation/quality/duration/timing/severity/associated sxs/prior Treatment) Patient is a 68 y.o. female presenting with chest pain. The history is provided by the patient (pt complains of left chest and neck pain with movement).  Chest Pain Pain location:  L chest Pain quality: dull   Pain radiates to:  Does not radiate Pain radiates to the back: no   Pain severity:  No pain Onset quality: hurts with movement. Timing:  Intermittent Progression:  Waxing and waning Chronicity:  New Associated symptoms: no abdominal pain, no back pain, no cough, no fatigue and no headache     Past Medical History  Diagnosis Date  . Diabetes mellitus without complication   . IBS (irritable bowel syndrome)   . Anemia   . Ulcer   . Diabetic neuropathy   . Hypertension    Past Surgical History  Procedure Laterality Date  . Abdominal hysterectomy      partial  . Back surgery     Family History  Problem Relation Age of Onset  . Hypertension Mother   . Stroke Mother   . Diabetes Father   . Cancer Sister   . Diabetes Brother   . Diabetes Sister   . Cancer Sister    History  Substance Use Topics  . Smoking status: Never Smoker   . Smokeless tobacco: Not on file  . Alcohol Use: Not on file   OB History   Grav Para Term Preterm Abortions TAB SAB Ect Mult Living                 Review of Systems  Constitutional: Negative for appetite change and fatigue.  HENT: Negative for congestion, ear discharge and sinus pressure.   Eyes: Negative for discharge.  Respiratory: Negative for cough.   Cardiovascular: Positive for chest pain.  Gastrointestinal: Negative for abdominal pain and diarrhea.  Genitourinary: Negative for frequency and hematuria.  Musculoskeletal: Negative for back pain.  Skin:  Negative for rash.  Neurological: Negative for seizures and headaches.  Psychiatric/Behavioral: Negative for hallucinations.      Allergies  Asa and Nsaids  Home Medications   Prior to Admission medications   Medication Sig Start Date End Date Taking? Authorizing Provider  acetaminophen (TYLENOL) 500 MG tablet Take 1,000 mg by mouth every 6 (six) hours as needed for mild pain or moderate pain.   Yes Historical Provider, MD  atorvastatin (LIPITOR) 40 MG tablet Take 1 tablet (40 mg total) by mouth daily. 11/15/13  Yes Mae Loree Fee, FNP  glipiZIDE (GLUCOTROL) 10 MG tablet Take 1 tablet (10 mg total) by mouth daily. 11/15/13  Yes Mae Loree Fee, FNP  Insulin Detemir (LEVEMIR FLEXPEN) 100 UNIT/ML Pen Inject 55 Units into the skin daily at 10 pm. 11/16/13  Yes Memory Argue, RPH  lisinopril (PRINIVIL,ZESTRIL) 20 MG tablet Take 1 tablet (20 mg total) by mouth daily. 11/15/13  Yes Mae Loree Fee, FNP  Multiple Vitamin (MULTIVITAMIN WITH MINERALS) TABS tablet Take 1 tablet by mouth every morning.   Yes Historical Provider, MD  tetrahydrozoline-zinc (VISINE-AC) 0.05-0.25 % ophthalmic solution Place 2 drops into both eyes daily as needed (for allergy eye relief).   Yes Historical Provider, MD  traMADol (ULTRAM) 50 MG tablet Take 1 tablet (50 mg total) by mouth every 6 (six) hours  as needed. 01/07/14   Maudry Diego, MD   BP 159/79  Pulse 81  Temp(Src) 98 F (36.7 C) (Oral)  Resp 16  Ht 5' 2"  (1.575 m)  Wt 196 lb (88.905 kg)  BMI 35.84 kg/m2  SpO2 96% Physical Exam  Constitutional: She is oriented to person, place, and time. She appears well-developed.  HENT:  Head: Normocephalic.  Eyes: Conjunctivae and EOM are normal. No scleral icterus.  Neck: Neck supple. No thyromegaly present.  Cardiovascular: Normal rate and regular rhythm.  Exam reveals no gallop and no friction rub.   No murmur heard. Pulmonary/Chest: No stridor. She has no wheezes. She has no rales. She exhibits no  tenderness.  Tender left ant chest.  Pain hurts with movement of left arm  Abdominal: She exhibits no distension. There is no tenderness. There is no rebound.  Musculoskeletal: Normal range of motion. She exhibits no edema.  Lymphadenopathy:    She has no cervical adenopathy.  Neurological: She is oriented to person, place, and time. She exhibits normal muscle tone. Coordination normal.  Skin: No rash noted. No erythema.  Psychiatric: She has a normal mood and affect. Her behavior is normal.    ED Course  Procedures (including critical care time) Labs Review Labs Reviewed - No data to display  Imaging Review Dg Chest 2 View  01/07/2014   CLINICAL DATA:  Chest and neck pain  EXAM: CHEST  2 VIEW  COMPARISON:  09/25/2004  FINDINGS: There is elevation of left hemidiaphragm stable from the prior exam. The cardiac shadow is within normal limits. The lungs are clear. No acute bony abnormality is noted.  IMPRESSION: No active cardiopulmonary disease.   Electronically Signed   By: Inez Catalina M.D.   On: 01/07/2014 19:07     EKG Interpretation None      MDM   Final diagnoses:  Chest wall pain       Maudry Diego, MD 01/07/14 [phone]

## 2014-01-07 NOTE — ED Notes (Signed)
Patient with no complaints at this time. Respirations even and unlabored. Skin warm/dry. Discharge instructions reviewed with patient at this time. Patient given opportunity to voice concerns/ask questions. Patient discharged at this time and left Emergency Department with steady gait.   

## 2014-01-07 NOTE — ED Notes (Signed)
Pain in neck started yesterday, pain comes and goes

## 2014-01-07 NOTE — Telephone Encounter (Signed)
Patient called complaining with jaw pain that radiates down her neck into her chest. I advised patient that she needed to go to the ER for evaluation that it could possibly be her heart and that she really needed to have it checked. Patient agrees and understands and states she will go to the ER.

## 2014-02-14 ENCOUNTER — Ambulatory Visit: Payer: Medicare HMO | Admitting: General Practice

## 2014-02-14 ENCOUNTER — Encounter: Payer: Self-pay | Admitting: Nurse Practitioner

## 2014-02-14 ENCOUNTER — Ambulatory Visit (INDEPENDENT_AMBULATORY_CARE_PROVIDER_SITE_OTHER): Payer: Medicare HMO | Admitting: Nurse Practitioner

## 2014-02-14 VITALS — BP 140/82 | HR 93 | Temp 98.1°F | Ht 62.0 in | Wt 186.8 lb

## 2014-02-14 DIAGNOSIS — E119 Type 2 diabetes mellitus without complications: Secondary | ICD-10-CM

## 2014-02-14 DIAGNOSIS — Z13820 Encounter for screening for osteoporosis: Secondary | ICD-10-CM

## 2014-02-14 DIAGNOSIS — E785 Hyperlipidemia, unspecified: Secondary | ICD-10-CM

## 2014-02-14 DIAGNOSIS — IMO0002 Reserved for concepts with insufficient information to code with codable children: Secondary | ICD-10-CM

## 2014-02-14 DIAGNOSIS — E7800 Pure hypercholesterolemia, unspecified: Secondary | ICD-10-CM

## 2014-02-14 DIAGNOSIS — I10 Essential (primary) hypertension: Secondary | ICD-10-CM

## 2014-02-14 DIAGNOSIS — D649 Anemia, unspecified: Secondary | ICD-10-CM

## 2014-02-14 DIAGNOSIS — E118 Type 2 diabetes mellitus with unspecified complications: Secondary | ICD-10-CM

## 2014-02-14 DIAGNOSIS — E1165 Type 2 diabetes mellitus with hyperglycemia: Secondary | ICD-10-CM

## 2014-02-14 LAB — POCT UA - MICROALBUMIN: MICROALBUMIN (UR) POC: 50 mg/L

## 2014-02-14 LAB — POCT GLYCOSYLATED HEMOGLOBIN (HGB A1C): Hemoglobin A1C: 8.5

## 2014-02-14 MED ORDER — INSULIN DETEMIR 100 UNIT/ML FLEXPEN: 65.0000 [IU] | PEN_INJECTOR | Freq: Every day | SUBCUTANEOUS | Status: DC

## 2014-02-14 MED ORDER — LISINOPRIL 20 MG PO TABS: 20.0000 mg | ORAL_TABLET | Freq: Every day | ORAL | Status: DC

## 2014-02-14 MED ORDER — GLIPIZIDE 10 MG PO TABS: 10.0000 mg | ORAL_TABLET | Freq: Every day | ORAL | Status: DC

## 2014-02-14 MED ORDER — ATORVASTATIN CALCIUM 40 MG PO TABS: 40.0000 mg | ORAL_TABLET | Freq: Every day | ORAL | Status: DC

## 2014-02-14 NOTE — Progress Notes (Signed)
Subjective:    Patient ID: Felicia Pugh, female    DOB: [birth year], 68 y.o.   MRN: [id number]  Hypertension This is a chronic problem. The current episode started more than 1 year ago. The problem is unchanged. The problem is controlled. Pertinent negatives include no blurred vision, headaches, neck pain, palpitations, PND or sweats. Risk factors for coronary artery disease include dyslipidemia, obesity, post-menopausal state and sedentary lifestyle. Past treatments include ACE inhibitors. The current treatment provides moderate improvement. Compliance problems include diet and exercise.   Hyperlipidemia This is a chronic problem. The current episode started more than 1 year ago. The problem is uncontrolled. Recent lipid tests were reviewed and are high. Exacerbating diseases include diabetes and obesity. She has no history of hypothyroidism. There are no known factors aggravating her hyperlipidemia. Current antihyperlipidemic treatment includes statins. The current treatment provides moderate improvement of lipids. Compliance problems include adherence to diet and adherence to exercise.  Risk factors for coronary artery disease include dyslipidemia, hypertension, obesity, post-menopausal and stress.  Diabetes She presents for her follow-up diabetic visit. She has type 2 diabetes mellitus. No MedicAlert identification noted. Her disease course has been fluctuating. Pertinent negatives for hypoglycemia include no headaches or sweats. Pertinent negatives for diabetes include no blurred vision. Current diabetic treatment includes oral agent (monotherapy) and insulin injections. Her weight is stable. When asked about meal planning, she reported none. She has not had a previous visit with a dietician. She rarely participates in exercise. Her breakfast blood glucose is taken between 8-9 am. Her breakfast blood glucose range is generally 90-110 mg/dl. Her lunch blood glucose is taken between 1-2 pm. Her lunch  blood glucose range is generally 110-130 mg/dl. Her highest blood glucose is 110-130 mg/dl. An ACE inhibitor/angiotensin II receptor blocker is being taken. She does not see a podiatrist.Eye exam is not current.      Review of Systems  Eyes: Negative for blurred vision.  Cardiovascular: Negative for palpitations and PND.  Musculoskeletal: Negative for neck pain.  Neurological: Negative for headaches.       Objective:   Physical Exam  Constitutional: She is oriented to person, place, and time. She appears well-developed and well-nourished.  HENT:  Nose: Nose normal.  Mouth/Throat: Oropharynx is clear and moist.  Eyes: EOM are normal.  Neck: Trachea normal, normal range of motion and full passive range of motion without pain. Neck supple. No JVD present. Carotid bruit is not present. No thyromegaly present.  Cardiovascular: Normal rate, regular rhythm, normal heart sounds and intact distal pulses.  Exam reveals no gallop and no friction rub.   No murmur heard. Pulmonary/Chest: Effort normal and breath sounds normal.  Abdominal: Soft. Bowel sounds are normal. She exhibits no distension and no mass. There is no tenderness.  Musculoskeletal: Normal range of motion.  Lymphadenopathy:    She has no cervical adenopathy.  Neurological: She is alert and oriented to person, place, and time. She has normal reflexes.  Skin: Skin is warm and dry.  Psychiatric: She has a normal mood and affect. Her behavior is normal. Judgment and thought content normal.   BP 140/82  Pulse 93  Temp(Src) 98.1 F (36.7 C) (Oral)  Ht 5' 2"  (1.575 m)  Wt 186 lb 12.8 oz (84.732 kg)  BMI 34.16 kg/m2  Results for orders placed in visit on 02/14/14  POCT GLYCOSYLATED HEMOGLOBIN (HGB A1C)      Result Value Ref Range   Hemoglobin A1C 8.5%  Assessment & Plan:   1. DIABETES MELLITUS, TYPE II   2. HYPERTENSION   3. Other and unspecified hyperlipidemia   4. HYPERCHOLESTEROLEMIA   5. ANEMIA   6.  Screening for osteoporosis   7. Type II or unspecified type diabetes mellitus with unspecified complication, uncontrolled   8. Essential hypertension    Orders Placed This Encounter  Procedures  . DG Bone Density    Standing Status: Future     Number of Occurrences:      Standing Expiration Date: 04/16/2015    Order Specific Question:  Reason for Exam (SYMPTOM  OR DIAGNOSIS REQUIRED)    Answer:  screening    Order Specific Question:  Preferred imaging location?    Answer:  Internal  . CMP14+EGFR  . NMR, lipoprofile  . POCT glycosylated hemoglobin (Hb A1C)  . POCT UA - Microalbumin   Meds ordered this encounter  Medications  . glipiZIDE (GLUCOTROL) 10 MG tablet    Sig: Take 1 tablet (10 mg total) by mouth daily.    Dispense:  90 tablet    Refill:  1    Order Specific Question:  Supervising Provider    Answer:  Chipper Herb [[id number]]  . Insulin Detemir (LEVEMIR FLEXPEN) 100 UNIT/ML Pen    Sig: Inject 65 Units into the skin daily at 10 pm.    Dispense:  7 pen    Refill:  4    Order Specific Question:  Supervising Provider    Answer:  Chipper Herb [[id number]]  . lisinopril (PRINIVIL,ZESTRIL) 20 MG tablet    Sig: Take 1 tablet (20 mg total) by mouth daily.    Dispense:  90 tablet    Refill:  1    Order Specific Question:  Supervising Provider    Answer:  Chipper Herb [[id number]]  . atorvastatin (LIPITOR) 40 MG tablet    Sig: Take 1 tablet (40 mg total) by mouth daily.    Dispense:  90 tablet    Refill:  1    Order Specific Question:  Supervising Provider    Answer:  Joycelyn Man   Increased levemir to 65u daily Eat snack before bedtime to prevent morning low blood sugars Labs pending Health maintenance reviewed Diet and exercise encouraged Continue all meds Follow up  In 3 month   [place], FNP

## 2014-02-14 NOTE — Patient Instructions (Signed)

## 2014-02-14 NOTE — Addendum Note (Signed)
Addended by: Pollyann Kennedy F on: 02/14/2014 11:05 AM   Modules accepted: Orders

## 2014-02-15 ENCOUNTER — Telehealth: Payer: Self-pay | Admitting: Family Medicine

## 2014-02-15 LAB — CMP14+EGFR
ALT: 23 IU/L (ref 0–32)
AST: 22 IU/L (ref 0–40)
Albumin/Globulin Ratio: 1.7 (ref 1.1–2.5)
Albumin: 4.3 g/dL (ref 3.6–4.8)
Alkaline Phosphatase: 145 IU/L — ABNORMAL HIGH (ref 39–117)
BUN / CREAT RATIO: 20 (ref 11–26)
BUN: 12 mg/dL (ref 8–27)
CO2: 25 mmol/L (ref 18–29)
Calcium: 9.2 mg/dL (ref 8.7–10.3)
Chloride: 105 mmol/L (ref 97–108)
Creatinine, Ser: 0.59 mg/dL (ref 0.57–1.00)
GFR calc Af Amer: 109 mL/min/{1.73_m2} (ref >59)
GFR calc non Af Amer: 94 mL/min/{1.73_m2} (ref >59)
Globulin, Total: 2.5 g/dL (ref 1.5–4.5)
Glucose: 118 mg/dL — ABNORMAL HIGH (ref 65–99)
Potassium: 3.8 mmol/L (ref 3.5–5.2)
SODIUM: 145 mmol/L — AB (ref 134–144)
Total Bilirubin: 0.4 mg/dL (ref 0.0–1.2)
Total Protein: 6.8 g/dL (ref 6.0–8.5)

## 2014-02-15 LAB — NMR, LIPOPROFILE
Cholesterol: 152 mg/dL (ref 100–199)
HDL Cholesterol by NMR: 68 mg/dL (ref >39)
HDL PARTICLE NUMBER: 41.4 umol/L (ref >=30.5)
LDL PARTICLE NUMBER: 1035 nmol/L — AB (ref <1000)
LDL Size: 20.7 nm (ref >20.5)
LDLC SERPL CALC-MCNC: 73 mg/dL (ref 0–99)
Small LDL Particle Number: 480 nmol/L (ref <=527)
Triglycerides by NMR: 54 mg/dL (ref 0–149)

## 2014-02-15 LAB — MICROALBUMIN, URINE: Microalbumin, Urine: 69.3 ug/mL — ABNORMAL HIGH (ref 0.0–17.0)

## 2014-02-15 NOTE — Telephone Encounter (Signed)
Message copied by Waverly Ferrari on Tue Feb 15, 2014 10:28 AM ------      Message from: Chevis Pretty      Created: Tue Feb 15, 2014  8:01 AM       Hgba1c discussed at appointment      Kidney and liver function stable      Cholesterol looks ok      microalbumin good      Continue current meds- low fat diet and exercise and recheck in 3 months       ------

## 2014-02-17 ENCOUNTER — Other Ambulatory Visit: Payer: Self-pay | Admitting: Nurse Practitioner

## 2014-02-17 MED ORDER — LISINOPRIL 10 MG PO TABS: 10.0000 mg | ORAL_TABLET | Freq: Every day | ORAL | Status: DC

## 2014-04-04 ENCOUNTER — Telehealth: Payer: Self-pay | Admitting: Nurse Practitioner

## 2014-04-08 ENCOUNTER — Telehealth: Payer: Self-pay

## 2014-04-08 NOTE — Telephone Encounter (Signed)
Need to find out from pharmacy if lantus would be cheaper- that is the only substitute for levemir

## 2014-04-08 NOTE — Telephone Encounter (Signed)
Is there any way you can put her on something other than Levemir because she is in the doughnut hole and it cost her $178.00 a month

## 2014-04-08 NOTE — Telephone Encounter (Signed)
Patient aware and she is going to contact pharmacy to see if lantus would be cheaper for her.

## 2014-04-12 LAB — HM DIABETES EYE EXAM

## 2014-04-14 ENCOUNTER — Encounter: Payer: Self-pay | Admitting: *Deleted

## 2014-05-03 ENCOUNTER — Telehealth: Payer: Self-pay | Admitting: Nurse Practitioner

## 2014-05-03 DIAGNOSIS — IMO0002 Reserved for concepts with insufficient information to code with codable children: Secondary | ICD-10-CM

## 2014-05-03 DIAGNOSIS — E1165 Type 2 diabetes mellitus with hyperglycemia: Secondary | ICD-10-CM

## 2014-05-03 DIAGNOSIS — E118 Type 2 diabetes mellitus with unspecified complications: Principal | ICD-10-CM

## 2014-05-03 MED ORDER — INSULIN DETEMIR 100 UNIT/ML FLEXPEN: 65.0000 [IU] | PEN_INJECTOR | Freq: Every day | SUBCUTANEOUS | Status: DC

## 2014-05-03 NOTE — Telephone Encounter (Signed)
2 samples available for pickup. Patience assistance information given to patient since samples aren't reliable.

## 2014-05-03 NOTE — Telephone Encounter (Signed)
Ok if we have levemir samples

## 2014-05-11 ENCOUNTER — Ambulatory Visit (INDEPENDENT_AMBULATORY_CARE_PROVIDER_SITE_OTHER): Payer: Medicare HMO | Admitting: Pharmacist

## 2014-05-11 ENCOUNTER — Encounter: Payer: Self-pay | Admitting: Pharmacist

## 2014-05-11 ENCOUNTER — Other Ambulatory Visit: Payer: Self-pay | Admitting: Pharmacist

## 2014-05-11 ENCOUNTER — Ambulatory Visit (INDEPENDENT_AMBULATORY_CARE_PROVIDER_SITE_OTHER): Payer: Medicare HMO

## 2014-05-11 VITALS — BP 138/76 | HR 77 | Ht 62.5 in | Wt 185.0 lb

## 2014-05-11 DIAGNOSIS — M899 Disorder of bone, unspecified: Secondary | ICD-10-CM | POA: Diagnosis not present

## 2014-05-11 DIAGNOSIS — M949 Disorder of cartilage, unspecified: Secondary | ICD-10-CM

## 2014-05-11 DIAGNOSIS — M8580 Other specified disorders of bone density and structure, unspecified site: Secondary | ICD-10-CM

## 2014-05-11 DIAGNOSIS — I83009 Varicose veins of unspecified lower extremity with ulcer of unspecified site: Secondary | ICD-10-CM

## 2014-05-11 DIAGNOSIS — L97909 Non-pressure chronic ulcer of unspecified part of unspecified lower leg with unspecified severity: Principal | ICD-10-CM

## 2014-05-11 DIAGNOSIS — Z13820 Encounter for screening for osteoporosis: Secondary | ICD-10-CM

## 2014-05-11 MED ORDER — INSULIN GLARGINE 300 UNIT/ML ~~LOC~~ SOPN: 65.0000 [IU] | PEN_INJECTOR | Freq: Every day | SUBCUTANEOUS | Status: DC

## 2014-05-11 NOTE — Progress Notes (Signed)
Patient ID: Felicia Pugh, female   DOB: October 01, 1945, 68 y.o.   MRN: [id number]  [facility] Current Height: Height: 5' 2.5" (158.8 cm)      Max Lifetime Height:  5' 2.5" Current Weight: Weight: 185 lb (83.915 kg)       Ethnicity:African American  BP: BP: 138/76 mmHg     HR:  Pulse Rate: 77      HPI: Patient with osteopenia here today to recheck DEXA.  Also to recheck DM.  She saw [facility] about 4 months ago and Invokamet was started but she could not tolerate - severe diarrhea.  Then tried just Invokana but she states that she also has diarrhea with Invokana also.    Currently taking glipizide 67m daily and  Levemir 55 units qhs (suppose to take 65 units but is in Medicare coverage gap and is trying to use less).  Has tried Januvia and Onglyza in past but was unable to find reason for discontinuation and patient could no remember - possibly cost.   Patient reports BG was 104 this am and is always good in am.  She only check BG in am.  Last A1c was 8.5%.  Has nocturnal hypoglycemia about 3-4 times per month.  Back Pain?  No       Kyphosis?  No Prior fracture?  No Med(s) for Osteoporosis/Osteopenia:  none Med(s) previously tried for Osteoporosis/Osteopenia:  none                                                             PMH: Age at menopause:  89'sHysterectomy?  Yes Oophorectomy?  No HRT? No Steroid Use?  No Thyroid med?  Yes History of cancer?  No History of digestive disorders (ie Crohn's)?  Yes - GI ulcer and IBS Current or previous eating disorders?  No Last Vitamin D Result:  40 (02/2013) Last GFR Result:  94 (02/14/2014)   FH/SH: Family history of osteoporosis?  No Parent with history of hip fracture?  No Family history of breast cancer?  Yes - sister Exercise?  No Smoking?  No Alcohol?  No    Calcium Assessment Calcium Intake  # of servings/day  Calcium mg  Milk (8 oz) 0  x  300  = 0  Yogurt (4 oz) 1 x  200 = 20102m Cheese (1 oz) 0 x  200 = 0   Other Calcium sources   25081mCa supplement MVI = 400m46mEstimated calcium intake per day 850mg42mDEXA Results Date of Test T-Score for AP Spine L1-L4 T-Score for Total Left Hip T-Score for Total Right Hip  05/11/2014 -0.2 0.4 -0.8  09/13/2009 -0.5 0.3 -0.3            Lowest T-Score = -1.2 at neck of right hip  Assessment: Osteopenia - stable BMD Uncontrolled DM - worsened by non compliance due to medication cost.  Recommendations: 1.  Gave #2 samples of Toujeo - take in place of Levemir - 65 units but change to qam.  Disucssed Trulicity and information given -  Patient to consider. 2.  recommend calcium 1200mg 45my through supplementation or diet.  3.  recommend weight bearing exercise - 30 minutes at least 4 days per week.   4.  Counseled  and educated about fall risk and prevention. 5.  Instructed to change to checking BG once daily at varying times.  Reviewed HBG goals based on timing of check with regards to meal.  6.  RTC in 1 month to see PCP and 2 months to see [place]:  2 years  Time spent counseling patient:  30 minutes  Cherre Robins, PharmD, CPP, CDE

## 2014-05-11 NOTE — Patient Instructions (Addendum)
Diabetes and Standards of Medical Care  Diabetes is complicated. You may find that your diabetes team includes a dietitian, nurse, diabetes educator, eye doctor, and more. To help everyone know what is going on and to help you get the care you deserve, the following schedule of care was developed to help keep you on track. Below are the tests, exams, vaccines, medicines, education, and plans you will need.  Blood Glucose Goals Fasting (nothing to eat within the last 4 hours) / Prior to meals = 80 - 130 Within 2 hours of the start of a meal or snack = less than 180  HbA1c test (goal is less than 7.0% - your last value was 8.5%) This test shows how well you have controlled your glucose over the past 2 3 months. It is used to see if your diabetes management plan needs to be adjusted.   It is performed at least 2 times a year if you are meeting treatment goals.  It is performed 4 times a year if therapy has changed or if you are not meeting treatment goals.   Blood pressure test  This test is performed at every routine medical visit. The goal is less than 140/90 mmHg for most people, but 130/80 mmHg in some cases. Ask your health care provider about your goal. Dental exam  Follow up with the dentist regularly. Eye exam  If you are diagnosed with type 1 diabetes as a child, get an exam upon reaching the age of 33 years or older and have had diabetes for 3 5 years. Yearly eye exams are recommended after that initial eye exam.  If you are diagnosed with type 1 diabetes as an adult, get an exam within 5 years of diagnosis and then yearly.  If you are diagnosed with type 2 diabetes, get an exam as soon as possible after the diagnosis and then yearly. Foot care exam  Visual foot exams are performed at every routine medical visit. The exams check for cuts, injuries, or other problems with the feet.  A comprehensive foot exam should be done yearly. This includes visual inspection as well as  assessing foot pulses and testing for loss of sensation.  Check your feet nightly for cuts, injuries, or other problems with your feet. Tell your health care provider if anything is not healing. Kidney function test (urine microalbumin)  This test is performed once a year.  Type 1 diabetes: The first test is performed 5 years after diagnosis.  Type 2 diabetes: The first test is performed at the time of diagnosis.  A serum creatinine and estimated glomerular filtration rate (eGFR) test is done once a year to assess the level of chronic kidney disease (CKD), if present. Lipid profile (cholesterol, HDL, LDL, triglycerides)  Performed every 5 years for most people.  The goal for LDL is less than 100 mg/dL. If you are at high risk, the goal is less than 70 mg/dL.  The goal for HDL is 40 mg/dL 50 mg/dL for men and 50 mg/dL 60 mg/dL for women. An HDL cholesterol of 60 mg/dL or higher gives some protection against heart disease.  The goal for triglycerides is less than 150 mg/dL. Influenza vaccine, pneumococcal vaccine, and hepatitis B vaccine  The influenza vaccine is recommended yearly.  The pneumococcal vaccine is generally given once in a lifetime. However, there are some instances when another vaccination is recommended. Check with your health care provider.  The hepatitis B vaccine is also recommended for adults with  diabetes. Diabetes self-management education  Education is recommended at diagnosis and ongoing as needed. Treatment plan  Your treatment plan is reviewed at every medical visit. Document Released: 05/26/2009 Document Revised: 03/31/2013 Document Reviewed: 12/29/2012 [facility] Patient Information 2014 [place].  Fall Prevention and Home Safety Falls cause injuries and can affect all age groups. It is possible to use preventive measures to significantly decrease the likelihood of falls. There are many simple measures which can make your home safer and prevent  falls. OUTDOORS  Repair cracks and edges of walkways and driveways.  Remove high doorway thresholds.  Trim shrubbery on the main path into your home.  Have good outside lighting.  Clear walkways of tools, rocks, debris, and clutter.  Check that handrails are not broken and are securely fastened. Both sides of steps should have handrails.  Have leaves, snow, and ice cleared regularly.  Use sand or salt on walkways during winter months.  In the garage, clean up grease or oil spills. BATHROOM  Install night lights.  Install grab bars by the toilet and in the tub and shower.  Use non-skid mats or decals in the tub or shower.  Place a plastic non-slip stool in the shower to sit on, if needed.  Keep floors dry and clean up all water on the floor immediately.  Remove soap buildup in the tub or shower on a regular basis.  Secure bath mats with non-slip, double-sided rug tape.  Remove throw rugs and tripping hazards from the floors. BEDROOMS  Install night lights.  Make sure a bedside light is easy to reach.  Do not use oversized bedding.  Keep a telephone by your bedside.  Have a firm chair with side arms to use for getting dressed.  Remove throw rugs and tripping hazards from the floor. KITCHEN  Keep handles on pots and pans turned toward the center of the stove. Use back burners when possible.  Clean up spills quickly and allow time for drying.  Avoid walking on wet floors.  Avoid hot utensils and knives.  Position shelves so they are not too high or low.  Place commonly used objects within easy reach.  If necessary, use a sturdy step stool with a grab bar when reaching.  Keep electrical cables out of the way.  Do not use floor polish or wax that makes floors slippery. If you must use wax, use non-skid floor wax.  Remove throw rugs and tripping hazards from the floor. STAIRWAYS  Never leave objects on stairs.  Place handrails on both sides of  stairways and use them. Fix any loose handrails. Make sure handrails on both sides of the stairways are as long as the stairs.  Check carpeting to make sure it is firmly attached along stairs. Make repairs to worn or loose carpet promptly.  Avoid placing throw rugs at the top or bottom of stairways, or properly secure the rug with carpet tape to prevent slippage. Get rid of throw rugs, if possible.  Have an electrician put in a light switch at the top and bottom of the stairs. OTHER FALL PREVENTION TIPS  Wear low-heel or rubber-soled shoes that are supportive and fit well. Wear closed toe shoes.  When using a stepladder, make sure it is fully opened and both spreaders are firmly locked. Do not climb a closed stepladder.  Add color or contrast paint or tape to grab bars and handrails in your home. Place contrasting color strips on first and last steps.  Learn and use  mobility aids as needed. Install an electrical emergency response system.  Turn on lights to avoid dark areas. Replace light bulbs that burn out immediately. Get light switches that glow.  Arrange furniture to create clear pathways. Keep furniture in the same place.  Firmly attach carpet with non-skid or double-sided tape.  Eliminate uneven floor surfaces.  Select a carpet pattern that does not visually hide the edge of steps.  Be aware of all pets. OTHER HOME SAFETY TIPS  Set the water temperature for 120 F (48.8 C).  Keep emergency numbers on or near the telephone.  Keep smoke detectors on every level of the home and near sleeping areas. Document Released: 07/19/2002 Document Revised: 01/28/2012 Document Reviewed: 10/18/2011 [facility] Patient Information 2015 [place], [place]. This information is not intended to replace advice given to you by your health care provider. Make sure you discuss any questions you have with your health care provider.               Exercise for Strong Bones  Exercise is important to  build and maintain strong bones / bone density.  There are 2 types of exercises that are important to building and maintaining strong bones:  Weight- bearing and muscle-stregthening.  Weight-bearing Exercises  These exercises include activities that make you move against gravity while staying upright. Weight-bearing exercises can be high-impact or low-impact.  High-impact weight-bearing exercises help build bones and keep them strong. If you have broken a bone due to osteoporosis or are at risk of breaking a bone, you may need to avoid high-impact exercises. If you're not sure, you should check with your healthcare provider.  Examples of high-impact weight-bearing exercises are: Dancing  Doing high-impact aerobics  Hiking  Jogging/running  Jumping Rope  Stair climbing  Tennis  Low-impact weight-bearing exercises can also help keep bones strong and are a safe alternative if you cannot do high-impact exercises.   Examples of low-impact weight-bearing exercises are: Using elliptical training machines  Doing low-impact aerobics  Using stair-step machines  Fast walking on a treadmill or outside   Muscle-Strengthening Exercises These exercises include activities where you move your body, a weight or some other resistance against gravity. They are also known as resistance exercises and include: Lifting weights  Using elastic exercise bands  Using weight machines  Lifting your own body weight  Functional movements, such as standing and rising up on your toes  Yoga and Pilates can also improve strength, balance and flexibility. However, certain positions may not be safe for people with osteoporosis or those at increased risk of broken bones. For example, exercises that have you bend forward may increase the chance of breaking a bone in the spine.   Non-Impact Exercises There are other types of exercises that can help prevent falls.  Non-impact exercises can help you to improve balance,  posture and how well you move in everyday activities. Some of these exercises include: Balance exercises that strengthen your legs and test your balance, such as Tai Chi, can decrease your risk of falls.  Posture exercises that improve your posture and reduce rounded or "sloping" shoulders can help you decrease the chance of breaking a bone, especially in the spine.  Functional exercises that improve how well you move can help you with everyday activities and decrease your chance of falling and breaking a bone. For example, if you have trouble getting up from a chair or climbing stairs, you should do these activities as exercises.   **A physical therapist can  teach you balance, posture and functional exercises. He/she can also help you learn which exercises are safe and appropriate for you.  [facility] has a physical therapy office in [place] in front of our office and referrals can be made for assessments and treatment as needed and strength and balance training.  If you would like to have an assessment with [facility] and our physical therapy team please let a nurse or provider know.

## 2014-05-11 NOTE — Progress Notes (Signed)
Patient ID: Felicia Pugh, female   DOB: 12/28/1945, 68 y.o.   MRN: [id number] Patient with discoloration of both lower extremeties.  She has a LE ulcer on right leg that was treated at wound center.  Discussed with Dr Laurance Flatten - recommended LE doppler of veins and atreries bilaterally.

## 2014-05-25 ENCOUNTER — Ambulatory Visit: Payer: Medicare HMO | Admitting: Nurse Practitioner

## 2014-05-27 ENCOUNTER — Ambulatory Visit (INDEPENDENT_AMBULATORY_CARE_PROVIDER_SITE_OTHER): Payer: Medicare HMO | Admitting: Nurse Practitioner

## 2014-05-27 ENCOUNTER — Encounter: Payer: Self-pay | Admitting: Nurse Practitioner

## 2014-05-27 VITALS — BP 152/86 | HR 98 | Temp 97.6°F | Ht 62.5 in | Wt 188.6 lb

## 2014-05-27 DIAGNOSIS — E119 Type 2 diabetes mellitus without complications: Secondary | ICD-10-CM

## 2014-05-27 DIAGNOSIS — K589 Irritable bowel syndrome without diarrhea: Secondary | ICD-10-CM

## 2014-05-27 DIAGNOSIS — Z23 Encounter for immunization: Secondary | ICD-10-CM

## 2014-05-27 DIAGNOSIS — E785 Hyperlipidemia, unspecified: Secondary | ICD-10-CM

## 2014-05-27 DIAGNOSIS — I10 Essential (primary) hypertension: Secondary | ICD-10-CM

## 2014-05-27 DIAGNOSIS — D649 Anemia, unspecified: Secondary | ICD-10-CM

## 2014-05-27 DIAGNOSIS — D638 Anemia in other chronic diseases classified elsewhere: Secondary | ICD-10-CM

## 2014-05-27 LAB — POCT GLYCOSYLATED HEMOGLOBIN (HGB A1C): HEMOGLOBIN A1C: 8.6

## 2014-05-27 MED ORDER — LISINOPRIL 20 MG PO TABS: 20.0000 mg | ORAL_TABLET | Freq: Every day | ORAL | Status: DC

## 2014-05-27 MED ORDER — ATORVASTATIN CALCIUM 40 MG PO TABS: 40.0000 mg | ORAL_TABLET | Freq: Every day | ORAL | Status: DC

## 2014-05-27 MED ORDER — GLIPIZIDE 10 MG PO TABS: 10.0000 mg | ORAL_TABLET | Freq: Every day | ORAL | Status: DC

## 2014-05-27 MED ORDER — INSULIN DETEMIR 100 UNIT/ML FLEXPEN: 65.0000 [IU] | PEN_INJECTOR | Freq: Every day | SUBCUTANEOUS | Status: DC

## 2014-05-27 NOTE — Progress Notes (Signed)
Subjective:    Patient ID: Felicia Pugh, female    DOB: January 19, 1946, 68 y.o.   MRN: [id number]  Patient here today for follow up of chronic medical problems.   Hypertension This is a chronic problem. The current episode started more than 1 year ago. The problem is unchanged. The problem is controlled. Pertinent negatives include no blurred vision, headaches, neck pain, palpitations, PND or sweats. Risk factors for coronary artery disease include dyslipidemia, obesity, post-menopausal state and sedentary lifestyle. Past treatments include ACE inhibitors. The current treatment provides moderate improvement. Compliance problems include diet and exercise.   Hyperlipidemia This is a chronic problem. The current episode started more than 1 year ago. The problem is uncontrolled. Recent lipid tests were reviewed and are high. Exacerbating diseases include diabetes and obesity. She has no history of hypothyroidism. There are no known factors aggravating her hyperlipidemia. Current antihyperlipidemic treatment includes statins. The current treatment provides moderate improvement of lipids. Compliance problems include adherence to diet and adherence to exercise.  Risk factors for coronary artery disease include dyslipidemia, hypertension, obesity, post-menopausal and stress.  Diabetes She presents for her follow-up diabetic visit. She has type 2 diabetes mellitus. No MedicAlert identification noted. Her disease course has been fluctuating. There are no hypoglycemic associated symptoms. Pertinent negatives for hypoglycemia include no headaches or sweats. Pertinent negatives for diabetes include no blurred vision. There are no hypoglycemic complications. There are no diabetic complications. Current diabetic treatment includes oral agent (monotherapy) and insulin injections. Her weight is stable. When asked about meal planning, she reported none. She has not had a previous visit with a dietician. She rarely  participates in exercise. Her breakfast blood glucose is taken between 8-9 am. Her breakfast blood glucose range is generally 130-140 mg/dl. Her lunch blood glucose is taken between 1-2 pm. Her lunch blood glucose range is generally 140-180 mg/dl. Her highest blood glucose is 140-180 mg/dl. Her overall blood glucose range is 130-140 mg/dl. An ACE inhibitor/angiotensin II receptor blocker is being taken. She does not see a podiatrist.Eye exam is not current.  IBS Currently on no meds- controls with diet no recent flare ups anemia Has not had checked recently- takes no iron supplements Osteopenia Does no exercising- no c/o back pain     Review of Systems  Eyes: Negative for blurred vision.  Cardiovascular: Negative for palpitations and PND.  Musculoskeletal: Negative for neck pain.  Neurological: Negative for headaches.       Objective:   Physical Exam  Constitutional: She is oriented to person, place, and time. She appears well-developed and well-nourished.  HENT:  Nose: Nose normal.  Mouth/Throat: Oropharynx is clear and moist.  Eyes: EOM are normal.  Neck: Trachea normal, normal range of motion and full passive range of motion without pain. Neck supple. No JVD present. Carotid bruit is not present. No thyromegaly present.  Cardiovascular: Normal rate, regular rhythm, normal heart sounds and intact distal pulses.  Exam reveals no gallop and no friction rub.   No murmur heard. Pulmonary/Chest: Effort normal and breath sounds normal.  Abdominal: Soft. Bowel sounds are normal. She exhibits no distension and no mass. There is no tenderness.  Musculoskeletal: Normal range of motion.  Lymphadenopathy:    She has no cervical adenopathy.  Neurological: She is alert and oriented to person, place, and time. She has normal reflexes.  Skin: Skin is warm and dry.  Psychiatric: She has a normal mood and affect. Her behavior is normal. Judgment and thought content normal.  BP 152/86  Pulse  98  Temp(Src) 97.6 F (36.4 C) (Oral)  Ht 5' 2.5" (1.588 m)  Wt 188 lb 9.6 oz (85.548 kg)  BMI 33.92 kg/m2   Results for orders placed in visit on 05/11/14  HM DIABETES EYE EXAM      Result Value Ref Range   HM Diabetic Eye Exam No Retinopathy  No Retinopathy    Results for orders placed in visit on 05/27/14  POCT GLYCOSYLATED HEMOGLOBIN (HGB A1C)      Result Value Ref Range   Hemoglobin A1C 8.6           Assessment & Plan:   1. Hyperlipidemia with target LDL less than 100 Low fat idet - NMR, lipoprofile - atorvastatin (LIPITOR) 40 MG tablet; Take 1 tablet (40 mg total) by mouth daily.  Dispense: 90 tablet; Refill: 1  2. Essential hypertension Low NA in diet - CMP14+EGFR - lisinopril (PRINIVIL,ZESTRIL) 20 MG tablet; Take 1 tablet (20 mg total) by mouth daily.  Dispense: 90 tablet; Refill: 1  3. Irritable bowel syndrome  4. Anemia, unspecified anemia type  5. Anemia, chronic disease - Anemia Profile B  6. Type 2 diabetes mellitus without complication Increase back to 60 u of toujeo until runs out of samples- then abck on levemir 65u Qam - POCT glycosylated hemoglobin (Hb A1C) - glipiZIDE (GLUCOTROL) 10 MG tablet; Take 1 tablet (10 mg total) by mouth daily.  Dispense: 90 tablet; Refill: 1 - Insulin Detemir (LEVEMIR FLEXPEN) 100 UNIT/ML Pen; Inject 65 Units into the skin daily at 10 pm.  Dispense: 7 pen; Refill: 5   Labs pending Health maintenance reviewed Diet and exercise encouraged Continue all meds Follow up  In 3 months   [place], FNP

## 2014-05-27 NOTE — Patient Instructions (Signed)

## 2014-05-28 LAB — NMR, LIPOPROFILE
Cholesterol: 144 mg/dL (ref 100–199)
HDL Cholesterol by NMR: 69 mg/dL (ref >39)
HDL PARTICLE NUMBER: 39.8 umol/L (ref >=30.5)
LDL PARTICLE NUMBER: 677 nmol/L (ref <1000)
LDL SIZE: 21 nm (ref >20.5)
LDLC SERPL CALC-MCNC: 67 mg/dL (ref 0–99)
LP-IR SCORE: 26 (ref <=45)
Small LDL Particle Number: 197 nmol/L (ref <=527)
Triglycerides by NMR: 42 mg/dL (ref 0–149)

## 2014-05-28 LAB — ANEMIA PROFILE B
Basophils Absolute: 0 10*3/uL (ref 0.0–0.2)
Basos: 0 %
EOS ABS: 0.1 10*3/uL (ref 0.0–0.4)
EOS: 2 %
Ferritin: 136 ng/mL (ref 15–150)
HEMATOCRIT: 34.1 % (ref 34.0–46.6)
HEMOGLOBIN: 10.7 g/dL — AB (ref 11.1–15.9)
IMMATURE GRANS (ABS): 0 10*3/uL (ref 0.0–0.1)
Immature Granulocytes: 0 %
Iron Saturation: 21 % (ref 15–55)
Iron: 57 ug/dL (ref 35–155)
LYMPHS: 27 %
Lymphocytes Absolute: 1.9 10*3/uL (ref 0.7–3.1)
MCH: 27.4 pg (ref 26.6–33.0)
MCHC: 31.4 g/dL — AB (ref 31.5–35.7)
MCV: 87 fL (ref 79–97)
MONOCYTES: 8 %
Monocytes Absolute: 0.6 10*3/uL (ref 0.1–0.9)
NEUTROS ABS: 4.6 10*3/uL (ref 1.4–7.0)
Neutrophils Relative %: 63 %
Platelets: 325 10*3/uL (ref 150–379)
RBC: 3.91 x10E6/uL (ref 3.77–5.28)
RDW: 14.3 % (ref 12.3–15.4)
Retic Ct Pct: 1 % (ref 0.6–2.6)
TIBC: 276 ug/dL (ref 250–450)
UIBC: 219 ug/dL (ref 150–375)
VITAMIN B 12: 558 pg/mL (ref 211–946)
WBC: 7.3 10*3/uL (ref 3.4–10.8)

## 2014-05-28 LAB — CMP14+EGFR
ALT: 17 IU/L (ref 0–32)
AST: 16 IU/L (ref 0–40)
Albumin/Globulin Ratio: 1.8 (ref 1.1–2.5)
Albumin: 4.2 g/dL (ref 3.6–4.8)
Alkaline Phosphatase: 131 IU/L — ABNORMAL HIGH (ref 39–117)
BILIRUBIN TOTAL: 0.4 mg/dL (ref 0.0–1.2)
BUN/Creatinine Ratio: 18 (ref 11–26)
BUN: 11 mg/dL (ref 8–27)
CALCIUM: 9.3 mg/dL (ref 8.7–10.3)
CO2: 25 mmol/L (ref 18–29)
CREATININE: 0.62 mg/dL (ref 0.57–1.00)
Chloride: 106 mmol/L (ref 97–108)
GFR, EST AFRICAN AMERICAN: 107 mL/min/{1.73_m2} (ref >59)
GFR, EST NON AFRICAN AMERICAN: 93 mL/min/{1.73_m2} (ref >59)
GLOBULIN, TOTAL: 2.3 g/dL (ref 1.5–4.5)
Glucose: 68 mg/dL (ref 65–99)
Potassium: 4.1 mmol/L (ref 3.5–5.2)
SODIUM: 146 mmol/L — AB (ref 134–144)
Total Protein: 6.5 g/dL (ref 6.0–8.5)

## 2014-05-30 ENCOUNTER — Telehealth: Payer: Self-pay | Admitting: *Deleted

## 2014-05-30 NOTE — Telephone Encounter (Signed)
Message copied by Shelbie Ammons on Mon May 30, 2014  4:05 PM ------      Message from: Chevis Pretty      Created: Sun May 29, 2014  2:18 PM       Hgba1c discussed at appointment      Kidney and liver function stable      Cholesterol looks great      hgb llow- needs OTC iron supplement and need stop e rechecked in 1 week      Continue current meds- low fat diet and exercise and recheck in 3 months       ------

## 2014-05-30 NOTE — Telephone Encounter (Signed)
Aware,begin iron and have labwork in a week to recheck.

## 2014-06-20 ENCOUNTER — Telehealth: Payer: Self-pay | Admitting: Family Medicine

## 2014-06-20 NOTE — Telephone Encounter (Signed)
Aware, labs will be ordered day of visit.

## 2014-06-24 ENCOUNTER — Ambulatory Visit: Payer: Self-pay

## 2014-07-08 ENCOUNTER — Encounter: Payer: Self-pay | Admitting: Pharmacist

## 2014-07-08 ENCOUNTER — Ambulatory Visit (INDEPENDENT_AMBULATORY_CARE_PROVIDER_SITE_OTHER): Payer: Medicare HMO | Admitting: Pharmacist

## 2014-07-08 VITALS — BP 138/78 | HR 78 | Ht 63.0 in | Wt 189.0 lb

## 2014-07-08 DIAGNOSIS — H9311 Tinnitus, right ear: Secondary | ICD-10-CM

## 2014-07-08 DIAGNOSIS — H9191 Unspecified hearing loss, right ear: Secondary | ICD-10-CM

## 2014-07-08 DIAGNOSIS — Z0000 Encounter for general adult medical examination without abnormal findings: Secondary | ICD-10-CM

## 2014-07-08 DIAGNOSIS — E119 Type 2 diabetes mellitus without complications: Secondary | ICD-10-CM

## 2014-07-08 DIAGNOSIS — Z8673 Personal history of transient ischemic attack (TIA), and cerebral infarction without residual deficits: Secondary | ICD-10-CM | POA: Insufficient documentation

## 2014-07-08 DIAGNOSIS — Z23 Encounter for immunization: Secondary | ICD-10-CM

## 2014-07-08 DIAGNOSIS — D649 Anemia, unspecified: Secondary | ICD-10-CM

## 2014-07-08 DIAGNOSIS — E785 Hyperlipidemia, unspecified: Secondary | ICD-10-CM

## 2014-07-08 LAB — GLUCOSE, POCT (MANUAL RESULT ENTRY): POC Glucose: 76 mg/dl (ref 70–99)

## 2014-07-08 NOTE — Progress Notes (Signed)
Patient ID: Felicia Pugh, female   DOB: [birth year], 68 y.o.   MRN: [id number] Subjective:    Felicia Pugh is a 68 y.o. female who presents for Medicare Initial Wellness Visit and follow up Diabetes  HPI: Patient with type 2 diabetes.  About 1 month ago was seen and Insulin dose changed from pm to 65 units qam.  Patient is in the Medicare Coverage Gap and has been using either Levemir or Toujeo insulin 65 units daily depending on what we are able to supply her in samples.    She has tried both [place] and Invokamet she also had diarrhea with both.   Has tried Januvia and Onglyza in past but was unable to find reason for discontinuation and patient could no remember - possibly cost.   Currently taking glipizide 1m daily and  Levemir 65 units qam (she has actually been using her husbands Lantus 40 units daily)  Patient reports BG was 104 this am and is always good in am.  She only check BG in am.  Last A1c was 8.6%.    Preventive Screening-Counseling & Management  Tobacco History  Smoking status  . Never Smoker   Smokeless tobacco  . Never Used    Current Problems (verified) Patient Active Problem List   Diagnosis Date Noted  . History of stroke 07/08/2014  . Osteopenia 05/11/2014  . Diabetes mellitus without complication 108/14/4818 . Hyperlipidemia with target LDL less than 100 06/01/2008  . Anemia, chronic disease 06/01/2008  . Essential hypertension 06/01/2008  . HEMORRHOIDS, INTERNAL 06/01/2008  . IRRITABLE BOWEL SYNDROME 06/01/2008  . HEMOCCULT POSITIVE STOOL 06/01/2008  . ARTHRITIS 06/01/2008  . WEIGHT GAIN 06/01/2008  . FECAL INCONTINENCE 06/01/2008  . DIARRHEA 06/01/2008    Medications Prior to Visit Current Outpatient Prescriptions on File Prior to Visit  Medication Sig Dispense Refill  . acetaminophen (TYLENOL) 500 MG tablet Take 1,000 mg by mouth every 6 (six) hours as needed for mild pain or moderate pain.    .Marland Kitchenatorvastatin (LIPITOR) 40 MG tablet Take 1  tablet (40 mg total) by mouth daily. 90 tablet 1  . glipiZIDE (GLUCOTROL) 10 MG tablet Take 1 tablet (10 mg total) by mouth daily. 90 tablet 1  . Insulin Glargine (TOUJEO SOLOSTAR) 300 UNIT/ML SOPN Inject 65 Units into the skin daily. 3 mL 0  . lisinopril (PRINIVIL,ZESTRIL) 20 MG tablet Take 1 tablet (20 mg total) by mouth daily. 90 tablet 1  . Multiple Vitamin (MULTIVITAMIN WITH MINERALS) TABS tablet Take 1 tablet by mouth every morning.    . Insulin Detemir (LEVEMIR FLEXPEN) 100 UNIT/ML Pen Inject 65 Units into the skin daily at 10 pm. (Patient not taking: Reported on 07/08/2014) 7 pen 5   No current facility-administered medications on file prior to visit.    Current Medications (verified) Current Outpatient Prescriptions  Medication Sig Dispense Refill  . acetaminophen (TYLENOL) 500 MG tablet Take 1,000 mg by mouth every 6 (six) hours as needed for mild pain or moderate pain.    .Marland Kitchenatorvastatin (LIPITOR) 40 MG tablet Take 1 tablet (40 mg total) by mouth daily. 90 tablet 1  . ferrous sulfate 325 (65 FE) MG tablet Take 325 mg by mouth daily with breakfast.    . glipiZIDE (GLUCOTROL) 10 MG tablet Take 1 tablet (10 mg total) by mouth daily. 90 tablet 1  . Insulin Glargine (TOUJEO SOLOSTAR) 300 UNIT/ML SOPN Inject 65 Units into the skin daily. 3 mL 0  . lisinopril (PRINIVIL,ZESTRIL) 20 MG  tablet Take 1 tablet (20 mg total) by mouth daily. 90 tablet 1  . Multiple Vitamin (MULTIVITAMIN WITH MINERALS) TABS tablet Take 1 tablet by mouth every morning.    . Insulin Detemir (LEVEMIR FLEXPEN) 100 UNIT/ML Pen Inject 65 Units into the skin daily at 10 pm. (Patient not taking: Reported on 07/08/2014) 7 pen 5   No current facility-administered medications for this visit.     Allergies (verified) Metformin and related; Asa; and Nsaids   PAST HISTORY  Family History Family History  Problem Relation Age of Onset  . Hypertension Mother   . Stroke Mother   . Diabetes Father     w/ BKA  .  Hypertension Father   . Cancer Sister   . Diabetes Sister   . Diabetes Brother   . Diabetes Sister   . Cancer Sister     breast  . Hypertension Sister     Social History History  Substance Use Topics  . Smoking status: Never Smoker   . Smokeless tobacco: Never Used  . Alcohol Use: No     Are there smokers in your home (other than you)? No  Risk Factors Current exercise habits: The patient does not participate in regular exercise at present.  Dietary issues discussed: limiting CHO   Cardiac risk factors: advanced age (older than 13 for men, 70 for women), diabetes mellitus, dyslipidemia, family history of premature cardiovascular disease, hypertension, microalbuminuria, obesity (BMI >= 30 kg/m2) and sedentary lifestyle.  Depression Screen (Note: if answer to either of the following is "Yes", a more complete depression screening is indicated)   Over the past 2 weeks, have you felt down, depressed or hopeless? No  Over the past 2 weeks, have you felt little interest or pleasure in doing things? No  Have you lost interest or pleasure in daily life? No  Do you often feel hopeless? No  Do you cry easily over simple problems? No  Activities of Daily Living In your present state of health, do you have any difficulty performing the following activities?:  Driving? No Managing money?  No Feeding yourself? No Getting from bed to chair? No  Climbing a flight of stairs? No Preparing food and eating?: No Bathing or showering? No Getting dressed: No Getting to the toilet? No Using the toilet:No Moving around from place to place: No In the past year have you fallen or had a near fall?:Yes - fell once on back steps.   Are you sexually active?  No  Do you have more than one partner?  No  Hearing Difficulties: Yes - decreased hearing on left side Do you often ask people to speak up or repeat themselves? Yes Do you experience ringing or noises in your ears? Yes - on right side after  "light stroke" Do you have difficulty understanding soft or whispered voices? No   Do you feel that you have a problem with memory? No  Do you often misplace items? Yes  Do you feel safe at home?  Yes  Cognitive Testing  Alert? Yes  Normal Appearance?Yes  Oriented to person? Yes  Place? Yes   Time? Yes  Recall of three objects?  Yes  Can perform simple calculations? Yes  Displays appropriate judgment?Yes  Can read the correct time from a watch face?Yes   Advanced Directives have been discussed with the patient? Yes  List the Names of Other Physician/Practitioners you currently use: 1.  Dr Gala Romney - GI 2. Dr Jeremy Johann - neurologist 3.  Dr  [place] any recent Medical Services you may have received from other than [facility] providers in the past year (date may be approximate).  Immunization History  Administered Date(s) Administered  . Influenza,inj,Quad PF,36+ Mos 05/13/2013, 05/27/2014    Screening Tests Health Maintenance  Topic Date Due  . COLON CANCER SCREENING ANNUAL FOBT  01/23/1996  . ZOSTAVAX  08/17/2014 (Originally 01/22/2006)  . TETANUS/TDAP  08/17/2014 (Originally 01/22/1965)  . HEMOGLOBIN A1C  11/26/2014  . URINE MICROALBUMIN  02/15/2015  . INFLUENZA VACCINE  03/13/2015  . OPHTHALMOLOGY EXAM  04/13/2015  . FOOT EXAM  05/28/2015  . MAMMOGRAM  08/27/2015  . COLONOSCOPY  02/14/2018  . PNEUMOCOCCAL POLYSACCHARIDE VACCINE AGE 56 AND OVER  Completed    All answers were reviewed with the patient and necessary referrals were made:  Cherre Robins, [facility]   07/08/2014   History reviewed: allergies, current medications, past family history, past medical history, past social history, past surgical history and problem list   Objective:    Body mass index is 33.49 kg/(m^2). BP 138/78 mmHg  Pulse 78  Ht 5' 3"  (1.6 m)  Wt 189 lb (85.73 kg)  BMI 33.49 kg/m2   Assessment:     Medicare Annual Wellness Visit - Initial Type 2 DM uncontrolled - having BG  in lower end of goal range in am but patient does not check at other times.  I suspect that she might be having highs later in day. Decreased hearing in right ear and ringing in ear     Plan:     During the course of the visit the patient was educated and counseled about appropriate screening and preventive services including:    Pneumococcal vaccine - Prevnar 13 given today  Influenza vaccine - UTD  Td vaccine - declined due to cost  Zostavax - declined due to cost  Screening mammography - UTD  Bone densitometry screening - UTD  Colorectal cancer screening - FOBT due - home testing kit given in office today  Eye exam - UTD  Referral to audiologist at patient request due to decreased hearing and ringing in right ear.  Nutrition counseling - reviewed CHO counting and caloric restriction  Advanced directives: has an advanced directive - a copy HAS NOT been provided.  Check BG in am and also in pm.  Gave #2 samples for Levemir in office today.  Orders Placed This Encounter  Procedures  . Pneumococcal conjugate vaccine 13-valent  . C-peptide  . Anemia Profile B  . POCT glucose (manual entry)    Diet review for nutrition referral? Yes ____  Not Indicated ___X_   Patient Instructions (the written plan) was given to the patient.  Medicare Attestation I have personally reviewed: The patient's medical and social history Their use of alcohol, tobacco or illicit drugs Their current medications and supplements The patient's functional ability including ADLs,fall risks, home safety risks, cognitive, and hearing and visual impairment Diet and physical activities Evidence for depression or mood disorders  The patient's weight, height, BMI, and HR / BP have been recorded in the chart.  I have made referrals, counseling, and provided education to the patient based on review of the above and I have provided the patient with a written personalized care plan for preventive  services.     Cherre Robins, [facility]   07/08/2014

## 2014-07-08 NOTE — Patient Instructions (Addendum)
Check Blood glucose once each morning and 1 or 2 times per week also check at another time of day (before lunch or supper or after a meal)   Diabetes and Standards of Medical Care   Diabetes is complicated. You may find that your diabetes team includes a dietitian, nurse, diabetes educator, eye doctor, and more. To help everyone know what is going on and to help you get the care you deserve, the following schedule of care was developed to help keep you on track. Below are the tests, exams, vaccines, medicines, education, and plans you will need.  Blood Glucose Goals Prior to meals = 80 - 130 Within 2 hours of the start of a meal = less than 180  HbA1c test (goal is less than 7.0% - your last value was 8.6%) This test shows how well you have controlled your glucose over the past 2 to 3 months. It is used to see if your diabetes management plan needs to be adjusted.   It is performed at least 2 times a year if you are meeting treatment goals.  It is performed 4 times a year if therapy has changed or if you are not meeting treatment goals.  Blood pressure test  This test is performed at every routine medical visit. The goal is less than 140/90 mmHg for most people, but 130/80 mmHg in some cases. Ask your health care provider about your goal.  Dental exam  Follow up with the dentist regularly.  Eye exam  If you are diagnosed with type 1 diabetes as a child, get an exam upon reaching the age of 65 years or older and have had diabetes for 3 to 5 years. Yearly eye exams are recommended after that initial eye exam.  If you are diagnosed with type 1 diabetes as an adult, get an exam within 5 years of diagnosis and then yearly.  If you are diagnosed with type 2 diabetes, get an exam as soon as possible after the diagnosis and then yearly.  Foot care exam  Visual foot exams are performed at every routine medical visit. The exams check for cuts, injuries, or other problems with the feet.  A  comprehensive foot exam should be done yearly. This includes visual inspection as well as assessing foot pulses and testing for loss of sensation.  Check your feet nightly for cuts, injuries, or other problems with your feet. Tell your health care provider if anything is not healing.  Kidney function test (urine microalbumin)  This test is performed once a year.  Type 1 diabetes: The first test is performed 5 years after diagnosis.  Type 2 diabetes: The first test is performed at the time of diagnosis.  A serum creatinine and estimated glomerular filtration rate (eGFR) test is done once a year to assess the level of chronic kidney disease (CKD), if present.  Lipid profile (cholesterol, HDL, LDL, triglycerides)  Performed every 5 years for most people.  The goal for LDL is less than 100 mg/dL. If you are at high risk, the goal is less than 70 mg/dL.  The goal for HDL is 40 mg/dL to 50 mg/dL for men and 50 mg/dL to 60 mg/dL for women. An HDL cholesterol of 60 mg/dL or higher gives some protection against heart disease.  The goal for triglycerides is less than 150 mg/dL.  Influenza vaccine, pneumococcal vaccine, and hepatitis B vaccine  The influenza vaccine is recommended yearly.  The pneumococcal vaccine is generally given once in a  lifetime. However, there are some instances when another vaccination is recommended. Check with your health care provider.  The hepatitis B vaccine is also recommended for adults with diabetes.  Diabetes self-management education  Education is recommended at diagnosis and ongoing as needed.  Treatment plan  Your treatment plan is reviewed at every medical visit.  Document Released: 05/26/2009 Document Revised: 03/31/2013 Document Reviewed: 12/29/2012 [facility] Patient Information 2014 [place].    Health Maintenance Summary     COLON CANCER SCREENING Annual Fecal Occult Blood Test   Gave test in office today   Dexa / Bone Density Next  Due 05/11/2016 Done 05/11/2014   Pneumonia Vaccine  Completed Prevnar 13 given today     ZOSTAVAX Postponed 08/17/2014 Patient Declined    TETANUS/TDAP Postponed 08/17/2014 Patient Declined    HEMOGLOBIN A1C Next Due 08/2014     URINE MICROALBUMIN Next Due 08/2014     INFLUENZA VACCINE Next Due 03/13/2015 Done Fall 2015    OPHTHALMOLOGY EXAM Next Due 04/13/2015 Done 04/12/2014    FOOT EXAM Next Due 05/28/2015     MAMMOGRAM Next Due 08/27/2015 Done 08/26/2013    COLONOSCOPY Next Due 02/14/2018 Done 02/2008

## 2014-07-09 LAB — ANEMIA PROFILE B
Basophils Absolute: 0 10*3/uL (ref 0.0–0.2)
Basos: 0 %
EOS: 2 %
Eosinophils Absolute: 0.2 10*3/uL (ref 0.0–0.4)
Ferritin: 147 ng/mL (ref 15–150)
Folate: >19.9 ng/mL (ref >3.0)
HCT: 34.2 % (ref 34.0–46.6)
Hemoglobin: 11 g/dL — ABNORMAL LOW (ref 11.1–15.9)
IMMATURE GRANS (ABS): 0 10*3/uL (ref 0.0–0.1)
IMMATURE GRANULOCYTES: 0 %
Iron Saturation: 30 % (ref 15–55)
Iron: 82 ug/dL (ref 35–155)
Lymphocytes Absolute: 2.3 10*3/uL (ref 0.7–3.1)
Lymphs: 29 %
MCH: 27.7 pg (ref 26.6–33.0)
MCHC: 32.2 g/dL (ref 31.5–35.7)
MCV: 86 fL (ref 79–97)
MONOCYTES: 8 %
MONOS ABS: 0.6 10*3/uL (ref 0.1–0.9)
NEUTROS PCT: 61 %
Neutrophils Absolute: 4.7 10*3/uL (ref 1.4–7.0)
PLATELETS: 372 10*3/uL (ref 150–379)
RBC: 3.97 x10E6/uL (ref 3.77–5.28)
RDW: 14.5 % (ref 12.3–15.4)
Retic Ct Pct: 1 % (ref 0.6–2.6)
TIBC: 277 ug/dL (ref 250–450)
UIBC: 195 ug/dL (ref 150–375)
VITAMIN B 12: 557 pg/mL (ref 211–946)
WBC: 7.8 10*3/uL (ref 3.4–10.8)

## 2014-07-09 LAB — C-PEPTIDE: C PEPTIDE: 3.1 ng/mL (ref 1.1–4.4)

## 2014-07-11 ENCOUNTER — Other Ambulatory Visit: Payer: Self-pay | Admitting: Nurse Practitioner

## 2014-07-11 ENCOUNTER — Telehealth: Payer: Self-pay | Admitting: Family Medicine

## 2014-07-11 DIAGNOSIS — H6523 Chronic serous otitis media, bilateral: Secondary | ICD-10-CM

## 2014-07-11 NOTE — Telephone Encounter (Signed)
-----   Message from Chevis Pretty, [place] sent at 07/11/2014  1:05 PM EST ----- Referral made to ENT

## 2014-07-13 ENCOUNTER — Telehealth: Payer: Self-pay | Admitting: Pharmacist

## 2014-07-13 NOTE — Telephone Encounter (Signed)
Returning Felicia Pugh's call.  Call Amirra at [phone]

## 2014-07-13 NOTE — Telephone Encounter (Signed)
C-Peptide was WNL. Tried to call patient - spoke with her husband and he will give her message that I called.

## 2014-08-12 DIAGNOSIS — K5090 Crohn's disease, unspecified, without complications: Secondary | ICD-10-CM

## 2014-08-12 HISTORY — DX: Crohn's disease, unspecified, without complications: K50.90

## 2014-09-07 ENCOUNTER — Ambulatory Visit: Payer: Medicare HMO | Admitting: Nurse Practitioner

## 2014-09-07 ENCOUNTER — Ambulatory Visit (INDEPENDENT_AMBULATORY_CARE_PROVIDER_SITE_OTHER): Payer: Medicare HMO | Admitting: Family Medicine

## 2014-09-07 ENCOUNTER — Encounter: Payer: Self-pay | Admitting: Family Medicine

## 2014-09-07 VITALS — BP 154/85 | HR 93 | Temp 97.4°F | Ht 63.0 in | Wt 188.0 lb

## 2014-09-07 DIAGNOSIS — E785 Hyperlipidemia, unspecified: Secondary | ICD-10-CM

## 2014-09-07 DIAGNOSIS — E119 Type 2 diabetes mellitus without complications: Secondary | ICD-10-CM | POA: Diagnosis not present

## 2014-09-07 DIAGNOSIS — I10 Essential (primary) hypertension: Secondary | ICD-10-CM

## 2014-09-07 DIAGNOSIS — Z8673 Personal history of transient ischemic attack (TIA), and cerebral infarction without residual deficits: Secondary | ICD-10-CM

## 2014-09-07 MED ORDER — GLIPIZIDE 10 MG PO TABS: 10.0000 mg | ORAL_TABLET | Freq: Every day | ORAL | Status: DC

## 2014-09-07 MED ORDER — FERROUS SULFATE 325 (65 FE) MG PO TABS: 325.0000 mg | ORAL_TABLET | Freq: Every day | ORAL | Status: DC

## 2014-09-07 MED ORDER — LISINOPRIL 20 MG PO TABS: 20.0000 mg | ORAL_TABLET | Freq: Every day | ORAL | Status: DC

## 2014-09-07 MED ORDER — INSULIN NPH (HUMAN) (ISOPHANE) 100 UNIT/ML ~~LOC~~ SUSP: 10.0000 [IU] | Freq: Two times a day (BID) | SUBCUTANEOUS | Status: DC

## 2014-09-07 MED ORDER — ATORVASTATIN CALCIUM 40 MG PO TABS: 40.0000 mg | ORAL_TABLET | Freq: Every day | ORAL | Status: DC

## 2014-09-07 NOTE — Progress Notes (Signed)
Subjective:    Patient ID: Felicia Pugh, female    DOB: July 19, 1946, 69 y.o.   MRN: [id number]  HPI  69 year old female comes in today to follow up chronic medical conditions including hypertension and diabetes. She also complains of numbness in tongue and lower lip for one month. She also complains of right elbow pain with no known injury. She does no repetitive motions or activities using her right arm. She is left-handed. Pain localizes to the lateral epicondyle. We spent some time discussing options for diabetes treatment. She is willing to use insulin injections in lieu of more expensive long-acting insulins or new oral medications  Patient Active Problem List   Diagnosis Date Noted  . History of stroke 07/08/2014  . Osteopenia 05/11/2014  . Diabetes mellitus without complication 42/35/3614  . Hyperlipidemia with target LDL less than 100 06/01/2008  . Anemia, chronic disease 06/01/2008  . Essential hypertension 06/01/2008  . HEMORRHOIDS, INTERNAL 06/01/2008  . IRRITABLE BOWEL SYNDROME 06/01/2008  . HEMOCCULT POSITIVE STOOL 06/01/2008  . ARTHRITIS 06/01/2008  . WEIGHT GAIN 06/01/2008  . FECAL INCONTINENCE 06/01/2008  . DIARRHEA 06/01/2008   Outpatient Encounter Prescriptions as of 09/07/2014  Medication Sig  . acetaminophen (TYLENOL) 500 MG tablet Take 1,000 mg by mouth every 6 (six) hours as needed for mild pain or moderate pain.  [name] atorvastatin (LIPITOR) 40 MG tablet Take 1 tablet (40 mg total) by mouth daily.  . ferrous sulfate 325 (65 FE) MG tablet Take 325 mg by mouth daily with breakfast.  . glipiZIDE (GLUCOTROL) 10 MG tablet Take 1 tablet (10 mg total) by mouth daily.  [name] lisinopril (PRINIVIL,ZESTRIL) 20 MG tablet Take 1 tablet (20 mg total) by mouth daily.  . Multiple Vitamin (MULTIVITAMIN WITH MINERALS) TABS tablet Take 1 tablet by mouth every morning.  . Probiotic Product (PROBIOTIC DAILY PO) Take by mouth.  . [DISCONTINUED] Insulin Detemir (LEVEMIR FLEXPEN) 100  UNIT/ML Pen Inject 65 Units into the skin daily at 10 pm.  . [DISCONTINUED] Insulin Glargine (TOUJEO SOLOSTAR) 300 UNIT/ML SOPN Inject 65 Units into the skin daily. (Patient not taking: Reported on 09/07/2014)      Review of Systems  Musculoskeletal: Positive for arthralgias.  Neurological: Positive for numbness.       Objective:   Physical Exam  Constitutional: She is oriented to person, place, and time. She appears well-developed and well-nourished.  Eyes: Conjunctivae and EOM are normal.  Neck: Normal range of motion. Neck supple.  Cardiovascular: Normal rate, regular rhythm and normal heart sounds.   Pulmonary/Chest: Effort normal and breath sounds normal.  Abdominal: Soft. Bowel sounds are normal.  Musculoskeletal: Normal range of motion.  Neurological: She is alert and oriented to person, place, and time. She has normal reflexes. No cranial nerve deficit.  Skin: Skin is warm and dry.  Psychiatric: She has a normal mood and affect. Her behavior is normal. Thought content normal.    BP 154/85 mmHg  Pulse 93  Temp(Src) 97.4 F (36.3 C) (Oral)  Ht 5' 3"  (1.6 m)  Wt 188 lb (85.276 kg)  BMI 33.31 kg/m2       Assessment & Plan:  1. Diabetes mellitus without complication Begin NPH, 10 units BID and monitor sugars; recheck in 1 month  2. Essential hypertension  - lisinopril (PRINIVIL,ZESTRIL) 20 MG tablet; Take 1 tablet (20 mg total) by mouth daily.  Dispense: 90 tablet; Refill: 1  3. History of stroke Nothing on exam to suggest; BP could be better  4. Lateral epicondylitis Try tennis elbow strap; cannot take NSIAD; consider injection if no better at next visit    Wardell Honour MD  4. Hyperlipidemia with target LDL less than 100  - atorvastatin (LIPITOR) 40 MG tablet; Take 1 tablet (40 mg total) by mouth daily.  Dispense: 90 tablet; Refill: 1  5. Type 2 diabetes mellitus without complication  - glipiZIDE (GLUCOTROL) 10 MG tablet; Take 1 tablet (10 mg  total) by mouth daily.  Dispense: 90 tablet; Refill: 1

## 2014-09-15 ENCOUNTER — Telehealth: Payer: Self-pay | Admitting: Family Medicine

## 2014-09-16 NOTE — Telephone Encounter (Signed)
Spoke with patient this am and she states that she is taking new insulin as rxd 10units at 10am and 10units at 5pm. Her Bs has been running between 150-220 in the mornings and 150-180 in the afternoon. Does dose need to be adjusted? Please advise and route to [facility]

## 2014-09-19 ENCOUNTER — Other Ambulatory Visit: Payer: Self-pay

## 2014-09-19 DIAGNOSIS — Z803 Family history of malignant neoplasm of breast: Secondary | ICD-10-CM

## 2014-09-19 DIAGNOSIS — Z1231 Encounter for screening mammogram for malignant neoplasm of breast: Secondary | ICD-10-CM

## 2014-09-19 MED ORDER — INSULIN SYRINGES (DISPOSABLE) U-100 0.5 ML MISC: Status: DC

## 2014-09-19 NOTE — Telephone Encounter (Signed)
Notified patient of change in dose to insulin. Patient verbalized understanding.

## 2014-09-19 NOTE — Telephone Encounter (Signed)
I would adjust insulin to 15 and 15 and continue to follow.  When we change insulin, it is better to be high than low and then adjust accordingly

## 2014-09-21 ENCOUNTER — Ambulatory Visit
Admission: RE | Admit: 2014-09-21 | Discharge: 2014-09-21 | Disposition: A | Discharge status: Home / Self Care | Payer: Medicare HMO | Source: Ambulatory Visit

## 2014-09-21 DIAGNOSIS — Z1231 Encounter for screening mammogram for malignant neoplasm of breast: Secondary | ICD-10-CM

## 2014-09-21 DIAGNOSIS — Z803 Family history of malignant neoplasm of breast: Secondary | ICD-10-CM

## 2014-09-27 ENCOUNTER — Telehealth: Payer: Self-pay | Admitting: Family Medicine

## 2014-09-28 NOTE — Telephone Encounter (Signed)
Patient states that BSs are still high. Yesterday morning it was 224 and last night was 289. She states this is where it usually stays. Is currently taking 15 units in AM and 15 units in PM. Wants to know if you would like to increase again. Has appt with you next week on 10-06-14. Please advise

## 2014-09-29 NOTE — Telephone Encounter (Signed)
Increase to 20 units AM and PM

## 2014-09-29 NOTE — Telephone Encounter (Signed)
Patient notified to increase insulin

## 2014-10-06 ENCOUNTER — Encounter: Payer: Self-pay | Admitting: Family Medicine

## 2014-10-06 ENCOUNTER — Ambulatory Visit (INDEPENDENT_AMBULATORY_CARE_PROVIDER_SITE_OTHER): Payer: Medicare HMO | Admitting: Family Medicine

## 2014-10-06 VITALS — BP 152/78 | HR 76 | Temp 96.9°F | Ht 63.0 in | Wt 188.0 lb

## 2014-10-06 DIAGNOSIS — M7711 Lateral epicondylitis, right elbow: Secondary | ICD-10-CM

## 2014-10-06 DIAGNOSIS — E119 Type 2 diabetes mellitus without complications: Secondary | ICD-10-CM

## 2014-10-06 DIAGNOSIS — M25521 Pain in right elbow: Secondary | ICD-10-CM

## 2014-10-06 LAB — POCT GLYCOSYLATED HEMOGLOBIN (HGB A1C): HEMOGLOBIN A1C: 8.3

## 2014-10-06 NOTE — Progress Notes (Signed)
   Subjective:    Patient ID: Felicia Pugh, female    DOB: 1945/12/18, 69 y.o.   MRN: [id number]  HPI 69 year old female is here to follow-up diabetes and also right elbow pain. At her last visit we switched her from Levemir to twice a day NPH and have been adjusting NPH over the phone since then. She brings in a recording of her sugars at home generally fasting sugars are around 100 in the p.m. sugars are anywhere from 150-220. Current dose is 20 and the morning and 20 evening.  Regarding her right elbow the pain is over the lateral epicondyle and is worse with certain movements such as dorsiflexion and gripping.  Patient Active Problem List   Diagnosis Date Noted  . History of stroke 07/08/2014  . Osteopenia 05/11/2014  . Diabetes mellitus without complication 36/07/2448  . Hyperlipidemia with target LDL less than 100 06/01/2008  . Anemia, chronic disease 06/01/2008  . Essential hypertension 06/01/2008  . HEMORRHOIDS, INTERNAL 06/01/2008  . IRRITABLE BOWEL SYNDROME 06/01/2008  . HEMOCCULT POSITIVE STOOL 06/01/2008  . ARTHRITIS 06/01/2008  . WEIGHT GAIN 06/01/2008  . FECAL INCONTINENCE 06/01/2008  . DIARRHEA 06/01/2008   Outpatient Encounter Prescriptions as of 10/06/2014  Medication Sig  . atorvastatin (LIPITOR) 40 MG tablet Take 1 tablet (40 mg total) by mouth daily.  . ferrous sulfate 325 (65 FE) MG tablet Take 1 tablet (325 mg total) by mouth daily with breakfast.  . glipiZIDE (GLUCOTROL) 10 MG tablet Take 1 tablet (10 mg total) by mouth daily.  . insulin NPH Human (HUMULIN N,NOVOLIN N) 100 UNIT/ML injection Inject 0.1 mLs (10 Units total) into the skin 2 (two) times daily at 10 AM and 5 PM. (Patient taking differently: Inject 20 Units into the skin 2 (two) times daily at 10 AM and 5 PM. )  . Insulin Syringes, Disposable, U-100 0.5 ML MISC Give insulin twice daily as directed  . lisinopril (PRINIVIL,ZESTRIL) 20 MG tablet Take 1 tablet (20 mg total) by mouth daily.  . Multiple  Vitamin (MULTIVITAMIN WITH MINERALS) TABS tablet Take 1 tablet by mouth every morning.  . Probiotic Product (PROBIOTIC DAILY PO) Take by mouth.  . [DISCONTINUED] acetaminophen (TYLENOL) 500 MG tablet Take 1,000 mg by mouth every 6 (six) hours as needed for mild pain or moderate pain.     Review of Systems  Constitutional: Negative.   HENT: Negative.   Respiratory: Negative.   Cardiovascular: Negative.   Gastrointestinal: Negative.   Musculoskeletal: Positive for arthralgias.  Skin: Negative.   Psychiatric/Behavioral: Negative.        Objective:   Physical Exam  Musculoskeletal:  Lateral epicondyle and point of maximal tenderness was injected with combination Marcaine and Kenalog. Patient tolerated this well.     BP 152/78 mmHg  Pulse 76  Temp(Src) 96.9 F (36.1 C) (Oral)  Ht 5' 3"  (1.6 m)  Wt 188 lb (85.276 kg)  BMI 33.31 kg/m2      Assessment & Plan:  1. Diabetes mellitus without complication Gradual increase AM NPH and decrease PM NPH - POCT glycosylated hemoglobin (Hb A1C)  Wardell Honour MD

## 2014-10-31 ENCOUNTER — Encounter: Payer: Self-pay | Admitting: Family Medicine

## 2014-10-31 ENCOUNTER — Ambulatory Visit (INDEPENDENT_AMBULATORY_CARE_PROVIDER_SITE_OTHER): Payer: Medicare HMO | Admitting: Family Medicine

## 2014-10-31 VITALS — BP 143/77 | HR 88 | Temp 97.8°F | Ht 63.0 in | Wt 187.0 lb

## 2014-10-31 DIAGNOSIS — J0100 Acute maxillary sinusitis, unspecified: Secondary | ICD-10-CM | POA: Diagnosis not present

## 2014-10-31 MED ORDER — PSEUDOEPHEDRINE-GUAIFENESIN ER 60-600 MG PO TB12: 1.0000 | ORAL_TABLET | Freq: Two times a day (BID) | ORAL | Status: DC

## 2014-10-31 MED ORDER — AMOXICILLIN-POT CLAVULANATE 875-125 MG PO TABS: 1.0000 | ORAL_TABLET | Freq: Two times a day (BID) | ORAL | Status: DC

## 2014-10-31 NOTE — Progress Notes (Signed)
Subjective:  Patient ID: Felicia Pugh, female    DOB: 10/16/1945  Age: 69 y.o. MRN: [id number]  CC: Sinusitis   HPI Symptoms include congestion, facial pain, nasal congestion, no  fever, non productive cough, post nasal drip and sinus pressure with no fever, chills, night sweats or weight loss. Onset of symptoms was a few days ago, gradually worsening since that time. Pt.is drinking moderate amounts of fluids.     History Nguyet has a past medical history of Diabetes mellitus without complication; IBS (irritable bowel syndrome); Anemia; Ulcer; Diabetic neuropathy; Hypertension; Hyperlipidemia; Thyroid nodule; and Cataract.   She has past surgical history that includes Abdominal hysterectomy and Back surgery.   Her family history includes Cancer in her sister and sister; Diabetes in her brother, father, sister, and sister; Hypertension in her father, mother, and sister; Stroke in her mother.She reports that she has never smoked. She has never used smokeless tobacco. She reports that she does not drink alcohol or use illicit drugs.  Current Outpatient Prescriptions on File Prior to Visit  Medication Sig Dispense Refill  . atorvastatin (LIPITOR) 40 MG tablet Take 1 tablet (40 mg total) by mouth daily. 90 tablet 1  . ferrous sulfate 325 (65 FE) MG tablet Take 1 tablet (325 mg total) by mouth daily with breakfast. 90 tablet 1  . glipiZIDE (GLUCOTROL) 10 MG tablet Take 1 tablet (10 mg total) by mouth daily. 90 tablet 1  . insulin NPH Human (HUMULIN N,NOVOLIN N) 100 UNIT/ML injection Inject 0.1 mLs (10 Units total) into the skin 2 (two) times daily at 10 AM and 5 PM. (Patient taking differently: Inject 20 Units into the skin 2 (two) times daily at 10 AM and 5 PM. ) 10 mL 11  . Insulin Syringes, Disposable, U-100 0.5 ML MISC Give insulin twice daily as directed 100 each 1  . lisinopril (PRINIVIL,ZESTRIL) 20 MG tablet Take 1 tablet (20 mg total) by mouth daily. 90 tablet 1  . Multiple Vitamin  (MULTIVITAMIN WITH MINERALS) TABS tablet Take 1 tablet by mouth every morning.    . Probiotic Product (PROBIOTIC DAILY PO) Take by mouth.     No current facility-administered medications on file prior to visit.    ROS Review of Systems  Constitutional: Negative for fever, chills, activity change and appetite change.  HENT: Positive for congestion, postnasal drip, rhinorrhea and sinus pressure. Negative for ear discharge, ear pain, hearing loss, nosebleeds, sneezing and trouble swallowing.   Respiratory: Negative for chest tightness and shortness of breath.   Cardiovascular: Negative for chest pain and palpitations.  Skin: Negative for rash.    Objective:  BP 143/77 mmHg  Pulse 88  Temp(Src) 97.8 F (36.6 C) (Oral)  Ht 5' 3"  (1.6 m)  Wt 187 lb (84.823 kg)  BMI 33.13 kg/m2  BP Readings from Last 3 Encounters:  10/31/14 143/77  10/06/14 152/78  09/07/14 154/85    Wt Readings from Last 3 Encounters:  10/31/14 187 lb (84.823 kg)  10/06/14 188 lb (85.276 kg)  09/07/14 188 lb (85.276 kg)     Physical Exam  Constitutional: She appears well-developed and well-nourished.  HENT:  Head: Normocephalic and atraumatic.  Right Ear: Tympanic membrane and external ear normal. No decreased hearing is noted.  Left Ear: Tympanic membrane and external ear normal. No decreased hearing is noted.  Nose: Mucosal edema present. Right sinus exhibits no frontal sinus tenderness. Left sinus exhibits no frontal sinus tenderness.  Mouth/Throat: No oropharyngeal exudate or posterior oropharyngeal erythema.  Neck: No Brudzinski's sign noted.  Pulmonary/Chest: Breath sounds normal. No respiratory distress.  Lymphadenopathy:       Head (right side): No preauricular adenopathy present.       Head (left side): No preauricular adenopathy present.       Right cervical: No superficial cervical adenopathy present.      Left cervical: No superficial cervical adenopathy present.    Lab Results  Component  Value Date   HGBA1C 8.3% 10/06/2014   HGBA1C 8.6 05/27/2014   HGBA1C 8.5% 02/14/2014    Lab Results  Component Value Date   WBC 7.8 07/08/2014   HGB 11.0* 07/08/2014   HCT 34.2 07/08/2014   PLT 372 07/08/2014   GLUCOSE 68 05/27/2014   CHOL 144 05/27/2014   TRIG 42 05/27/2014   HDL 69 05/27/2014   LDLCALC 67 05/27/2014   ALT 17 05/27/2014   AST 16 05/27/2014   NA 146* 05/27/2014   K 4.1 05/27/2014   CL 106 05/27/2014   CREATININE 0.62 05/27/2014   BUN 11 05/27/2014   CO2 25 05/27/2014   TSH 1.416 02/09/2013   HGBA1C 8.3% 10/06/2014    Mm Screening Breast Tomo Bilateral  09/23/2014   CLINICAL DATA:  Screening.  EXAM: DIGITAL SCREENING BILATERAL MAMMOGRAM WITH 3D TOMO WITH CAD  COMPARISON:  Previous exam(s).  ACR Breast Density Category b: There are scattered areas of fibroglandular density.  FINDINGS: There are no findings suspicious for malignancy. Images were processed with CAD.  IMPRESSION: No mammographic evidence of malignancy. A result letter of this screening mammogram will be mailed directly to the patient.  RECOMMENDATION: Screening mammogram in one year. (Code:[id number])  BI-RADS CATEGORY  1: Negative.   Electronically Signed   By: Altamese Cabal M.D.   On: 09/23/2014 10:18    Assessment & Plan:   Witney was seen today for sinusitis.  Diagnoses and all orders for this visit:  Acute maxillary sinusitis, recurrence not specified  Other orders -     amoxicillin-clavulanate (AUGMENTIN) 875-125 MG per tablet; Take 1 tablet by mouth 2 (two) times daily. Take all of this medication -     pseudoephedrine-guaifenesin (MUCINEX D) 60-600 MG per tablet; Take 1 tablet by mouth every 12 (twelve) hours.   I am having Ms. Charity start on amoxicillin-clavulanate and pseudoephedrine-guaifenesin. I am also having her maintain her multivitamin with minerals, Probiotic Product (PROBIOTIC DAILY PO), lisinopril, ferrous sulfate, atorvastatin, glipiZIDE, insulin NPH Human, and Insulin  Syringes (Disposable).  Meds ordered this encounter  Medications  . amoxicillin-clavulanate (AUGMENTIN) 875-125 MG per tablet    Sig: Take 1 tablet by mouth 2 (two) times daily. Take all of this medication    Dispense:  20 tablet    Refill:  0  . pseudoephedrine-guaifenesin (MUCINEX D) 60-600 MG per tablet    Sig: Take 1 tablet by mouth every 12 (twelve) hours.    Dispense:  12 tablet    Refill:  0     Follow-up: Return if symptoms worsen or fail to improve.  Claretta Fraise, M.D.

## 2014-11-03 ENCOUNTER — Telehealth: Payer: Self-pay | Admitting: Family Medicine

## 2014-11-03 MED ORDER — LEVOFLOXACIN 500 MG PO TABS: 500.0000 mg | ORAL_TABLET | Freq: Every day | ORAL | Status: DC

## 2014-11-03 NOTE — Telephone Encounter (Signed)
Patient aware.

## 2014-11-03 NOTE — Telephone Encounter (Signed)
Stop augmentin and start the new antibiotic, levoquin. Scrip sent to pharmacy

## 2014-11-03 NOTE — Telephone Encounter (Signed)
Patient stated that the augmentin is giving her uncontrollable diarrhea and wants to switch antibiotic

## 2014-11-14 ENCOUNTER — Telehealth: Payer: Self-pay | Admitting: Family Medicine

## 2014-11-15 MED ORDER — GLUCOSE BLOOD VI STRP: ORAL_STRIP | Status: DC

## 2014-11-15 NOTE — Telephone Encounter (Signed)
done

## 2014-11-19 ENCOUNTER — Other Ambulatory Visit: Payer: Self-pay | Admitting: Family Medicine

## 2014-11-19 MED ORDER — GLUCOSE BLOOD VI STRP: ORAL_STRIP | Status: DC

## 2014-11-19 NOTE — Telephone Encounter (Signed)
rx resent to pharmacy

## 2014-11-21 ENCOUNTER — Other Ambulatory Visit: Payer: Self-pay | Admitting: *Deleted

## 2014-11-21 MED ORDER — GLUCOSE BLOOD VI STRP: ORAL_STRIP | Status: DC

## 2014-12-07 ENCOUNTER — Other Ambulatory Visit: Payer: Self-pay | Admitting: Family Medicine

## 2014-12-07 MED ORDER — GLUCOSE BLOOD VI STRP: ORAL_STRIP | Status: DC

## 2014-12-07 NOTE — Telephone Encounter (Signed)
done

## 2014-12-25 ENCOUNTER — Emergency Department (HOSPITAL_COMMUNITY): Payer: Medicare HMO

## 2014-12-25 ENCOUNTER — Emergency Department (HOSPITAL_COMMUNITY)
Admission: EM | Admit: 2014-12-25 | Discharge: 2014-12-25 | Disposition: A | Discharge status: Home / Self Care | Payer: Medicare HMO | Attending: Emergency Medicine | Admitting: Emergency Medicine

## 2014-12-25 ENCOUNTER — Encounter (HOSPITAL_COMMUNITY): Payer: Self-pay | Admitting: Emergency Medicine

## 2014-12-25 DIAGNOSIS — M542 Cervicalgia: Secondary | ICD-10-CM | POA: Insufficient documentation

## 2014-12-25 DIAGNOSIS — D649 Anemia, unspecified: Secondary | ICD-10-CM | POA: Insufficient documentation

## 2014-12-25 DIAGNOSIS — Z794 Long term (current) use of insulin: Secondary | ICD-10-CM | POA: Diagnosis not present

## 2014-12-25 DIAGNOSIS — R221 Localized swelling, mass and lump, neck: Secondary | ICD-10-CM | POA: Insufficient documentation

## 2014-12-25 DIAGNOSIS — Z792 Long term (current) use of antibiotics: Secondary | ICD-10-CM | POA: Diagnosis not present

## 2014-12-25 DIAGNOSIS — R079 Chest pain, unspecified: Secondary | ICD-10-CM | POA: Diagnosis not present

## 2014-12-25 DIAGNOSIS — H269 Unspecified cataract: Secondary | ICD-10-CM | POA: Diagnosis not present

## 2014-12-25 DIAGNOSIS — E785 Hyperlipidemia, unspecified: Secondary | ICD-10-CM | POA: Diagnosis not present

## 2014-12-25 DIAGNOSIS — Z872 Personal history of diseases of the skin and subcutaneous tissue: Secondary | ICD-10-CM | POA: Insufficient documentation

## 2014-12-25 DIAGNOSIS — Z79899 Other long term (current) drug therapy: Secondary | ICD-10-CM | POA: Diagnosis not present

## 2014-12-25 DIAGNOSIS — E1140 Type 2 diabetes mellitus with diabetic neuropathy, unspecified: Secondary | ICD-10-CM | POA: Diagnosis not present

## 2014-12-25 DIAGNOSIS — I10 Essential (primary) hypertension: Secondary | ICD-10-CM | POA: Diagnosis not present

## 2014-12-25 LAB — CBC
HCT: 36.7 % (ref 36.0–46.0)
HEMOGLOBIN: 11.3 g/dL — AB (ref 12.0–15.0)
MCH: 27.6 pg (ref 26.0–34.0)
MCHC: 30.8 g/dL (ref 30.0–36.0)
MCV: 89.5 fL (ref 78.0–100.0)
Platelets: 314 10*3/uL (ref 150–400)
RBC: 4.1 MIL/uL (ref 3.87–5.11)
RDW: 14 % (ref 11.5–15.5)
WBC: 9.8 10*3/uL (ref 4.0–10.5)

## 2014-12-25 LAB — I-STAT TROPONIN, ED: TROPONIN I, POC: 0 ng/mL (ref 0.00–0.08)

## 2014-12-25 LAB — COMPREHENSIVE METABOLIC PANEL
ALBUMIN: 4 g/dL (ref 3.5–5.0)
ALT: 13 U/L — AB (ref 14–54)
AST: 18 U/L (ref 15–41)
Alkaline Phosphatase: 141 U/L — ABNORMAL HIGH (ref 38–126)
Anion gap: 8 (ref 5–15)
BUN: 12 mg/dL (ref 6–20)
CALCIUM: 9.1 mg/dL (ref 8.9–10.3)
CHLORIDE: 107 mmol/L (ref 101–111)
CO2: 29 mmol/L (ref 22–32)
Creatinine, Ser: 0.66 mg/dL (ref 0.44–1.00)
GFR calc Af Amer: >60 mL/min (ref >60)
GLUCOSE: 61 mg/dL — AB (ref 65–99)
Potassium: 3.6 mmol/L (ref 3.5–5.1)
Sodium: 144 mmol/L (ref 135–145)
Total Bilirubin: 0.3 mg/dL (ref 0.3–1.2)
Total Protein: 7.4 g/dL (ref 6.5–8.1)

## 2014-12-25 LAB — D-DIMER, QUANTITATIVE (NOT AT ARMC): D DIMER QUANT: 0.37 ug{FEU}/mL (ref 0.00–0.48)

## 2014-12-25 LAB — PROTIME-INR
INR: 0.96 (ref 0.00–1.49)
Prothrombin Time: 12.9 seconds (ref 11.6–15.2)

## 2014-12-25 MED ORDER — TRAMADOL HCL 50 MG PO TABS
50.0000 mg | ORAL_TABLET | Freq: Four times a day (QID) | ORAL | Status: DC | PRN
Start: 2014-12-25 — End: 2015-01-19

## 2014-12-25 MED ORDER — SODIUM CHLORIDE 0.9 % IV SOLN: 1000.0000 mL | INTRAVENOUS | Status: DC

## 2014-12-25 MED ORDER — MORPHINE SULFATE 4 MG/ML IJ SOLN
4.0000 mg | Freq: Once | INTRAMUSCULAR | Status: AC
Start: 2014-12-25 — End: 2014-12-25
  Administered 2014-12-25: 4 mg via INTRAVENOUS
  Filled 2014-12-25: qty 1

## 2014-12-25 MED ORDER — CYCLOBENZAPRINE HCL 5 MG PO TABS: 5.0000 mg | ORAL_TABLET | Freq: Three times a day (TID) | ORAL | Status: DC | PRN

## 2014-12-25 NOTE — ED Notes (Addendum)
Patient c/o neck pain that radiates into shoulders bilaterally and down into mid chest. Per patient started Wednesday and is progressively gotten worse-worse with movement. Patient does reports some shortness of breath. Patient took tramadol with no relief.

## 2014-12-25 NOTE — Discharge Instructions (Signed)
Take the medications as needed for pain. Follow-up with your primary care doctor next week if symptoms persist. Return as needed for difficulty breathing fever other concerns

## 2014-12-25 NOTE — ED Provider Notes (Signed)
CSN: [id number]     Arrival date & time 12/25/14  1341 History   First MD Initiated Contact with Patient 12/25/14 1505     Chief Complaint  Patient presents with  . Neck Pain    Patient is a 69 y.o. female presenting with neck pain. The history is provided by the patient.  Neck Pain Pain location: mid and lower cervical. Quality:  Stabbing Radiates to: mid chest. Pain severity:  Moderate Onset quality:  Gradual Duration:  5 days Timing:  Constant Progression:  Worsening Worsened by:  Bending and twisting Associated symptoms: chest pain   Associated symptoms: no fever, no tingling and no weakness   Associated symptoms comment:  Some shortness of breath,  Some tingling in her fingers  Risk factors: no hx of spinal trauma and no recent head injury   no history of heart or lung disease  Past Medical History  Diagnosis Date  . Diabetes mellitus without complication   . IBS (irritable bowel syndrome)   . Anemia   . Ulcer   . Diabetic neuropathy   . Hypertension   . Hyperlipidemia   . Thyroid nodule   . Cataract    Past Surgical History  Procedure Laterality Date  . Abdominal hysterectomy      partial  . Back surgery      x3 (Dr Loyola Mast, Dr Sonny Dandy x 2)   Family History  Problem Relation Age of Onset  . Hypertension Mother   . Stroke Mother   . Diabetes Father     w/ BKA  . Hypertension Father   . Cancer Sister   . Diabetes Sister   . Diabetes Brother   . Diabetes Sister   . Cancer Sister     breast  . Hypertension Sister    History  Substance Use Topics  . Smoking status: Never Smoker   . Smokeless tobacco: Never Used  . Alcohol Use: No   OB History    No data available     Review of Systems  Constitutional: Negative for fever.  Cardiovascular: Positive for chest pain.  Musculoskeletal: Positive for neck pain.  Neurological: Negative for tingling and weakness.  All other systems reviewed and are negative.     Allergies  Metformin and related;  Asa; and Nsaids  Home Medications   Prior to Admission medications   Medication Sig Start Date End Date Taking? Authorizing Provider  amoxicillin-clavulanate (AUGMENTIN) 875-125 MG per tablet Take 1 tablet by mouth 2 (two) times daily. Take all of this medication 10/31/14   Claretta Fraise, MD  atorvastatin (LIPITOR) 40 MG tablet Take 1 tablet (40 mg total) by mouth daily. 09/07/14   Wardell Honour, MD  ferrous sulfate 325 (65 FE) MG tablet Take 1 tablet (325 mg total) by mouth daily with breakfast. 09/07/14   Wardell Honour, MD  glipiZIDE (GLUCOTROL) 10 MG tablet Take 1 tablet (10 mg total) by mouth daily. 09/07/14   Wardell Honour, MD  glucose blood test strip One Touch verio test strips. Test bid. E11.9 12/07/14   Claretta Fraise, MD  insulin NPH Human (HUMULIN N,NOVOLIN N) 100 UNIT/ML injection Inject 0.1 mLs (10 Units total) into the skin 2 (two) times daily at 10 AM and 5 PM. Patient taking differently: Inject 20 Units into the skin 2 (two) times daily at 10 AM and 5 PM.  09/07/14   Wardell Honour, MD  Insulin Syringes, Disposable, U-100 0.5 ML MISC Give insulin twice daily as directed 09/19/14  Mary-Margaret Hassell Done, FNP  levofloxacin (LEVAQUIN) 500 MG tablet Take 1 tablet (500 mg total) by mouth daily. 11/03/14   Claretta Fraise, MD  lisinopril (PRINIVIL,ZESTRIL) 20 MG tablet Take 1 tablet (20 mg total) by mouth daily. 09/07/14   Wardell Honour, MD  Multiple Vitamin (MULTIVITAMIN WITH MINERALS) TABS tablet Take 1 tablet by mouth every morning.    Historical Provider, MD  Probiotic Product (PROBIOTIC DAILY PO) Take by mouth.    Historical Provider, MD  pseudoephedrine-guaifenesin (MUCINEX D) 60-600 MG per tablet Take 1 tablet by mouth every 12 (twelve) hours. 10/31/14   Claretta Fraise, MD   BP 189/96 mmHg  Pulse 122  Temp(Src) 98.3 F (36.8 C) (Oral)  Resp 18  Ht 5' 3"  (1.6 m)  Wt 187 lb (84.823 kg)  BMI 33.13 kg/m2  SpO2 96% Physical Exam  Constitutional: She appears well-developed and  well-nourished. No distress.  HENT:  Head: Normocephalic and atraumatic.  Right Ear: External ear normal.  Left Ear: External ear normal.  Eyes: Conjunctivae are normal. Right eye exhibits no discharge. Left eye exhibits no discharge. No scleral icterus.  Neck: Neck supple. No tracheal deviation present.  Cardiovascular: Normal rate, regular rhythm and intact distal pulses.   Pulmonary/Chest: Effort normal and breath sounds normal. No stridor. No respiratory distress. She has no wheezes. She has no rales.  Abdominal: Soft. Bowel sounds are normal. She exhibits no distension. There is no tenderness. There is no rebound and no guarding.  Musculoskeletal: She exhibits no edema.       Cervical back: She exhibits tenderness and swelling. She exhibits no bony tenderness and no edema.  Neurological: She is alert. She has normal strength. No cranial nerve deficit (no facial droop, extraocular movements intact, no slurred speech) or sensory deficit. She exhibits normal muscle tone. She displays no seizure activity. Coordination normal.  Skin: Skin is warm and dry. No rash noted.  Psychiatric: She has a normal mood and affect.  Nursing note and vitals reviewed.   ED Course  Procedures (including critical care time) Labs Review Labs Reviewed  CBC - Abnormal; Notable for the following:    Hemoglobin 11.3 (*)    All other components within normal limits  COMPREHENSIVE METABOLIC PANEL - Abnormal; Notable for the following:    Glucose, Bld 61 (*)    ALT 13 (*)    Alkaline Phosphatase 141 (*)    All other components within normal limits  PROTIME-INR  D-DIMER, QUANTITATIVE  I-STAT TROPOININ, ED    Imaging Review Dg Chest 2 View  12/25/2014   CLINICAL DATA:  Shortness of breath and chest pain.  EXAM: CHEST  2 VIEW  COMPARISON:  01/07/2014 and prior chest radiographs  FINDINGS: Upper limits normal heart size and elevation of the left hemidiaphragm again noted.  There is no evidence of focal  airspace disease, pulmonary edema, suspicious pulmonary nodule/mass, pleural effusion, or pneumothorax. No acute bony abnormalities are identified.  IMPRESSION: No evidence of acute cardiopulmonary disease.   Electronically Signed   By: Margarette Canada M.D.   On: 12/25/2014 16:33     EKG Interpretation   Date/Time:  Sunday Dec 25 2014 14:00:12 EDT Ventricular Rate:  126 PR Interval:  176 QRS Duration: 96 QT Interval:  298 QTC Calculation: 431 R Axis:   40 Text Interpretation:  Sinus tachycardia ST \T\ T wave abnormality,  consider inferolateral ischemia Abnormal ECG inferior and lateral T wave  inversions Confirmed by Wyvonnia Dusky  MD, STEPHEN [phone]) on 12/25/2014 2:04:17  PM     Medications  0.9 %  sodium chloride infusion (not administered)  morphine 4 MG/ML injection 4 mg (4 mg Intravenous Given 12/25/14 1531)    MDM   Final diagnoses:  Neck pain    The patient's symptoms seem to be musculoskeletal in nature. Symptoms have been ongoing for days. She does have an abnormal EKG this most likely is her baseline. Her cardiac enzymes are normal.  D-dimer is negative. I doubt pulmonary embolism or acute coronary syndrome.  Discharge home with medications for pain and muscle relaxants.    Dorie Rank, MD 12/25/14 [phone]

## 2014-12-25 NOTE — ED Notes (Signed)
Pt sinus tach on monitor,

## 2014-12-25 NOTE — ED Notes (Signed)
Dr Tomi Bamberger at bedside,

## 2014-12-25 NOTE — ED Notes (Signed)
Pt c/o mid neck pain that radiates to between shoulder blades, pain is worse with movement, denies any injury,

## 2015-01-19 ENCOUNTER — Encounter: Payer: Self-pay | Admitting: Family Medicine

## 2015-01-19 ENCOUNTER — Other Ambulatory Visit: Payer: Medicare HMO

## 2015-01-19 ENCOUNTER — Ambulatory Visit (INDEPENDENT_AMBULATORY_CARE_PROVIDER_SITE_OTHER): Payer: Medicare HMO | Admitting: Family Medicine

## 2015-01-19 VITALS — BP 156/89 | HR 89 | Temp 97.8°F | Ht 63.0 in | Wt 186.0 lb

## 2015-01-19 DIAGNOSIS — D638 Anemia in other chronic diseases classified elsewhere: Secondary | ICD-10-CM | POA: Diagnosis not present

## 2015-01-19 DIAGNOSIS — M129 Arthropathy, unspecified: Secondary | ICD-10-CM

## 2015-01-19 DIAGNOSIS — M8580 Other specified disorders of bone density and structure, unspecified site: Secondary | ICD-10-CM

## 2015-01-19 DIAGNOSIS — I10 Essential (primary) hypertension: Secondary | ICD-10-CM

## 2015-01-19 DIAGNOSIS — E119 Type 2 diabetes mellitus without complications: Secondary | ICD-10-CM | POA: Diagnosis not present

## 2015-01-19 DIAGNOSIS — E785 Hyperlipidemia, unspecified: Secondary | ICD-10-CM

## 2015-01-19 DIAGNOSIS — R5383 Other fatigue: Secondary | ICD-10-CM | POA: Diagnosis not present

## 2015-01-19 DIAGNOSIS — Z8673 Personal history of transient ischemic attack (TIA), and cerebral infarction without residual deficits: Secondary | ICD-10-CM

## 2015-01-19 LAB — POCT UA - MICROALBUMIN: MICROALBUMIN (UR) POC: 50 mg/L

## 2015-01-19 LAB — POCT GLYCOSYLATED HEMOGLOBIN (HGB A1C): Hemoglobin A1C: 7.7

## 2015-01-19 MED ORDER — LISINOPRIL 20 MG PO TABS: 20.0000 mg | ORAL_TABLET | Freq: Every day | ORAL | Status: DC

## 2015-01-19 MED ORDER — INSULIN NPH (HUMAN) (ISOPHANE) 100 UNIT/ML ~~LOC~~ SUSP: SUBCUTANEOUS | Status: DC

## 2015-01-19 MED ORDER — FERROUS SULFATE 325 (65 FE) MG PO TABS: 325.0000 mg | ORAL_TABLET | Freq: Every day | ORAL | Status: DC

## 2015-01-19 MED ORDER — ATORVASTATIN CALCIUM 40 MG PO TABS: 40.0000 mg | ORAL_TABLET | Freq: Every day | ORAL | Status: DC

## 2015-01-19 MED ORDER — GLIPIZIDE 10 MG PO TABS: 10.0000 mg | ORAL_TABLET | Freq: Every day | ORAL | Status: DC

## 2015-01-19 NOTE — Progress Notes (Signed)
Subjective:    Patient ID: Felicia Pugh, female    DOB: [birth year], 69 y.o.   MRN: [id number]  HPI 69 year old female here to follow-up diabetes, hypertension, and hyperlipidemia, she currently takes NPH twice a day as well as glipizide for her diabetes. She has had some low blood sugars and we have adjusted her insulin accordingly also since her last visit here she was seen in emergency room for neck pain which has resolved and thought to have been muscle spasm. She also had an episode of sinusitis and was treated with Augmentin which she did not tolerate but subsequently took Levaquin.  Today she complains of some fatigue. CBC and her most recent emergency room 1 month ago showed mild anemia but probably not anemic enough to account for fatigue. She has had some stress in her life but is not a particularly anxious person as far as a calls for fatigue.    Review of Systems  Constitutional: Positive for fatigue.  HENT: Negative.   Respiratory: Negative.   Cardiovascular: Negative.   Neurological: Negative.   Psychiatric/Behavioral: Negative.        Patient Active Problem List   Diagnosis Date Noted  . History of stroke 07/08/2014  . Osteopenia 05/11/2014  . Diabetes mellitus without complication 45/85/9292  . Hyperlipidemia with target LDL less than 100 06/01/2008  . Anemia, chronic disease 06/01/2008  . Essential hypertension 06/01/2008  . HEMORRHOIDS, INTERNAL 06/01/2008  . IRRITABLE BOWEL SYNDROME 06/01/2008  . HEMOCCULT POSITIVE STOOL 06/01/2008  . 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 WEIGHT GAIN 06/01/2008  . FECAL INCONTINENCE 06/01/2008  . DIARRHEA 06/01/2008   Outpatient Encounter Prescriptions as of 01/19/2015  Medication Sig  . atorvastatin (LIPITOR) 40 MG tablet Take 1 tablet (40 mg total) by mouth daily.  . ferrous sulfate 325 (65 FE) MG tablet Take 1 tablet (325 mg total) by mouth daily with breakfast.  . glipiZIDE (GLUCOTROL) 10 MG tablet Take 1 tablet (10 mg total) by  mouth daily.  [name] glucose blood test strip One Touch verio test strips. Test bid. E11.9  . insulin NPH Human (HUMULIN N,NOVOLIN N) 100 UNIT/ML injection 22 units in the morning and 15 units at night  . Insulin Syringes, Disposable, U-100 0.5 ML MISC Give insulin twice daily as directed  . lisinopril (PRINIVIL,ZESTRIL) 20 MG tablet Take 1 tablet (20 mg total) by mouth daily.  . Probiotic Product (PROBIOTIC DAILY PO) Take 1 tablet by mouth daily.   . [DISCONTINUED] atorvastatin (LIPITOR) 40 MG tablet Take 1 tablet (40 mg total) by mouth daily.  . [DISCONTINUED] ferrous sulfate 325 (65 FE) MG tablet Take 1 tablet (325 mg total) by mouth daily with breakfast.  . [DISCONTINUED] glipiZIDE (GLUCOTROL) 10 MG tablet Take 1 tablet (10 mg total) by mouth daily.  . [DISCONTINUED] insulin NPH Human (HUMULIN N,NOVOLIN N) 100 UNIT/ML injection Inject 0.1 mLs (10 Units total) into the skin 2 (two) times daily at 10 AM and 5 PM. (Patient taking differently: Inject 15-22 Units into the skin 2 (two) times daily at 10 AM and 5 PM. 22 units in the morning and 15 units at night)  . [DISCONTINUED] lisinopril (PRINIVIL,ZESTRIL) 20 MG tablet Take 1 tablet (20 mg total) by mouth daily.  . [DISCONTINUED] amoxicillin-clavulanate (AUGMENTIN) 875-125 MG per tablet Take 1 tablet by mouth 2 (two) times daily. Take all of this medication (Patient not taking: Reported on 12/25/2014)  . [DISCONTINUED] cyclobenzaprine (FLEXERIL) 5 MG tablet Take 1 tablet (5 mg total) by mouth 3 (three)  times daily as needed for muscle spasms.  . [DISCONTINUED] levofloxacin (LEVAQUIN) 500 MG tablet Take 1 tablet (500 mg total) by mouth daily. (Patient not taking: Reported on 12/25/2014)  . [DISCONTINUED] pseudoephedrine-guaifenesin (MUCINEX D) 60-600 MG per tablet Take 1 tablet by mouth every 12 (twelve) hours. (Patient not taking: Reported on 12/25/2014)  . [DISCONTINUED] traMADol (ULTRAM) 50 MG tablet Take 1 tablet (50 mg total) by mouth every 6 (six) hours  as needed.   No facility-administered encounter medications on file as of 01/19/2015.    Objective:   Physical Exam  Constitutional: She appears well-developed and well-nourished.  Cardiovascular: Normal rate and regular rhythm.   Pulmonary/Chest: Effort normal and breath sounds normal.  Musculoskeletal: Normal range of motion.          Assessment & Plan:  1. Osteopenia Alkaline phosphatase was slightly elevated on last chemistries. This could suggest some bone turnover - DG Bone Density; Future  2. Hyperlipidemia with target LDL less than 100 Spent 1 year since we checked lipids so need to do that today - atorvastatin (LIPITOR) 40 MG tablet; Take 1 tablet (40 mg total) by mouth daily.  Dispense: 90 tablet; Refill: 1 - Lipid panel  3. History of stroke No residual  4. Essential hypertension Pressure slightly elevated today. I have asked her to check it at home - lisinopril (PRINIVIL,ZESTRIL) 20 MG tablet; Take 1 tablet (20 mg total) by mouth daily.  Dispense: 90 tablet; Refill: 1  5. Diabetes mellitus without complication Last J4Z was 8.3 and not at goal. Changes have been made to her insulin regimen - POCT glycosylated hemoglobin (Hb A1C) - Microalbumin, urine - POCT UA - Microalbumin  6. 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 Anemia, chronic disease Taking iron supplement  8. Other fatigue Might be age-related but we need to rule out hypothyroid - TSH  9. Type 2 diabetes mellitus without complication  - glipiZIDE (GLUCOTROL) 10 MG tablet; Take 1 tablet (10 mg total) by mouth daily.  Dispense: 90 tablet; Refill: 1

## 2015-01-19 NOTE — Addendum Note (Signed)
Addended by: Earlene Plater on: 01/19/2015 10:38 AM   Modules accepted: Orders

## 2015-01-20 LAB — LIPID PANEL
Chol/HDL Ratio: 2.1 ratio units (ref 0.0–4.4)
Cholesterol, Total: 159 mg/dL (ref 100–199)
HDL: 75 mg/dL (ref >39)
LDL Calculated: 69 mg/dL (ref 0–99)
Triglycerides: 76 mg/dL (ref 0–149)
VLDL Cholesterol Cal: 15 mg/dL (ref 5–40)

## 2015-01-20 LAB — TSH: TSH: 2.65 u[IU]/mL (ref 0.450–4.500)

## 2015-01-20 LAB — MICROALBUMIN, URINE: MICROALBUM., U, RANDOM: 42.1 ug/mL

## 2015-01-25 NOTE — Progress Notes (Signed)
Lipids are basically unchanged and good. No changes to medication recommended

## 2015-02-06 ENCOUNTER — Other Ambulatory Visit: Payer: Medicare HMO

## 2015-02-08 LAB — FECAL OCCULT BLOOD, IMMUNOCHEMICAL: FECAL OCCULT BLD: NEGATIVE

## 2015-02-17 ENCOUNTER — Encounter: Payer: Self-pay | Admitting: Family Medicine

## 2015-02-26 ENCOUNTER — Encounter (HOSPITAL_COMMUNITY): Payer: Self-pay | Admitting: *Deleted

## 2015-02-26 ENCOUNTER — Emergency Department (HOSPITAL_COMMUNITY): Payer: Medicare HMO

## 2015-02-26 ENCOUNTER — Emergency Department (HOSPITAL_COMMUNITY)
Admission: EM | Admit: 2015-02-26 | Discharge: 2015-02-26 | Disposition: A | Discharge status: Home / Self Care | Payer: Medicare HMO | Attending: Emergency Medicine | Admitting: Emergency Medicine

## 2015-02-26 DIAGNOSIS — E1140 Type 2 diabetes mellitus with diabetic neuropathy, unspecified: Secondary | ICD-10-CM | POA: Diagnosis not present

## 2015-02-26 DIAGNOSIS — Z794 Long term (current) use of insulin: Secondary | ICD-10-CM | POA: Diagnosis not present

## 2015-02-26 DIAGNOSIS — R42 Dizziness and giddiness: Secondary | ICD-10-CM | POA: Diagnosis not present

## 2015-02-26 DIAGNOSIS — I10 Essential (primary) hypertension: Secondary | ICD-10-CM | POA: Insufficient documentation

## 2015-02-26 DIAGNOSIS — E785 Hyperlipidemia, unspecified: Secondary | ICD-10-CM | POA: Diagnosis not present

## 2015-02-26 DIAGNOSIS — G8929 Other chronic pain: Secondary | ICD-10-CM | POA: Diagnosis not present

## 2015-02-26 DIAGNOSIS — Z8719 Personal history of other diseases of the digestive system: Secondary | ICD-10-CM | POA: Diagnosis not present

## 2015-02-26 DIAGNOSIS — D649 Anemia, unspecified: Secondary | ICD-10-CM | POA: Diagnosis not present

## 2015-02-26 DIAGNOSIS — H269 Unspecified cataract: Secondary | ICD-10-CM | POA: Insufficient documentation

## 2015-02-26 DIAGNOSIS — R112 Nausea with vomiting, unspecified: Secondary | ICD-10-CM | POA: Diagnosis present

## 2015-02-26 DIAGNOSIS — Z872 Personal history of diseases of the skin and subcutaneous tissue: Secondary | ICD-10-CM | POA: Diagnosis not present

## 2015-02-26 DIAGNOSIS — Z79899 Other long term (current) drug therapy: Secondary | ICD-10-CM | POA: Diagnosis not present

## 2015-02-26 LAB — COMPREHENSIVE METABOLIC PANEL
ALT: 15 U/L (ref 14–54)
ANION GAP: 8 (ref 5–15)
AST: 19 U/L (ref 15–41)
Albumin: 3.8 g/dL (ref 3.5–5.0)
Alkaline Phosphatase: 112 U/L (ref 38–126)
BUN: 15 mg/dL (ref 6–20)
CO2: 27 mmol/L (ref 22–32)
Calcium: 8.3 mg/dL — ABNORMAL LOW (ref 8.9–10.3)
Chloride: 107 mmol/L (ref 101–111)
Creatinine, Ser: 0.69 mg/dL (ref 0.44–1.00)
GFR calc Af Amer: >60 mL/min (ref >60)
GFR calc non Af Amer: >60 mL/min (ref >60)
Glucose, Bld: 218 mg/dL — ABNORMAL HIGH (ref 65–99)
Potassium: 3 mmol/L — ABNORMAL LOW (ref 3.5–5.1)
SODIUM: 142 mmol/L (ref 135–145)
TOTAL PROTEIN: 7 g/dL (ref 6.5–8.1)
Total Bilirubin: 0.5 mg/dL (ref 0.3–1.2)

## 2015-02-26 LAB — CBC WITH DIFFERENTIAL/PLATELET
BLASTS: 0 %
Band Neutrophils: 0 % (ref 0–10)
Basophils Absolute: 0 10*3/uL (ref 0.0–0.1)
Basophils Relative: 0 % (ref 0–1)
Eosinophils Absolute: 0.1 10*3/uL (ref 0.0–0.7)
Eosinophils Relative: 1 % (ref 0–5)
HCT: 34.7 % — ABNORMAL LOW (ref 36.0–46.0)
HEMOGLOBIN: 10.9 g/dL — AB (ref 12.0–15.0)
Lymphocytes Relative: 18 % (ref 12–46)
Lymphs Abs: 1.9 10*3/uL (ref 0.7–4.0)
MCH: 27.9 pg (ref 26.0–34.0)
MCHC: 31.4 g/dL (ref 30.0–36.0)
MCV: 88.7 fL (ref 78.0–100.0)
Metamyelocytes Relative: 0 %
Monocytes Absolute: 0.3 10*3/uL (ref 0.1–1.0)
Monocytes Relative: 3 % (ref 3–12)
Myelocytes: 0 %
NEUTROS ABS: 8.3 10*3/uL — AB (ref 1.7–7.7)
NEUTROS PCT: 78 % — AB (ref 43–77)
OTHER: 0 %
PROMYELOCYTES ABS: 0 %
Platelets: 266 10*3/uL (ref 150–400)
RBC: 3.91 MIL/uL (ref 3.87–5.11)
RDW: 13.9 % (ref 11.5–15.5)
WBC: 10.6 10*3/uL — ABNORMAL HIGH (ref 4.0–10.5)
nRBC: 0 /100 WBC

## 2015-02-26 LAB — CBG MONITORING, ED: Glucose-Capillary: 214 mg/dL — ABNORMAL HIGH (ref 65–99)

## 2015-02-26 LAB — URINE MICROSCOPIC-ADD ON

## 2015-02-26 LAB — URINALYSIS, ROUTINE W REFLEX MICROSCOPIC
Bilirubin Urine: NEGATIVE
GLUCOSE, UA: 500 mg/dL — AB
Ketones, ur: NEGATIVE mg/dL
LEUKOCYTES UA: NEGATIVE
Nitrite: NEGATIVE
PH: 6 (ref 5.0–8.0)
PROTEIN: 30 mg/dL — AB
Specific Gravity, Urine: 1.02 (ref 1.005–1.030)
Urobilinogen, UA: 0.2 mg/dL (ref 0.0–1.0)

## 2015-02-26 LAB — TROPONIN I: Troponin I: <0.03 ng/mL (ref <0.031)

## 2015-02-26 MED ORDER — DIAZEPAM 5 MG PO TABS: 2.5000 mg | ORAL_TABLET | Freq: Three times a day (TID) | ORAL | Status: DC | PRN

## 2015-02-26 MED ORDER — ACETAMINOPHEN 500 MG PO TABS
1000.0000 mg | ORAL_TABLET | Freq: Once | ORAL | Status: AC
  Administered 2015-02-26: 1000 mg via ORAL
  Filled 2015-02-26: qty 2

## 2015-02-26 MED ORDER — POTASSIUM CHLORIDE CRYS ER 20 MEQ PO TBCR
40.0000 meq | EXTENDED_RELEASE_TABLET | Freq: Once | ORAL | Status: AC
  Administered 2015-02-26: 40 meq via ORAL
  Filled 2015-02-26: qty 2

## 2015-02-26 MED ORDER — SODIUM CHLORIDE 0.9 % IV BOLUS (SEPSIS)
1000.0000 mL | Freq: Once | INTRAVENOUS | Status: AC
  Administered 2015-02-26: 1000 mL via INTRAVENOUS

## 2015-02-26 MED ORDER — ONDANSETRON HCL 4 MG/2ML IJ SOLN
4.0000 mg | Freq: Once | INTRAMUSCULAR | Status: AC
  Administered 2015-02-26: 4 mg via INTRAVENOUS
  Filled 2015-02-26: qty 2

## 2015-02-26 MED ORDER — DIAZEPAM 5 MG/ML IJ SOLN
2.5000 mg | Freq: Once | INTRAMUSCULAR | Status: AC
  Administered 2015-02-26: 2.5 mg via INTRAVENOUS
  Filled 2015-02-26: qty 2

## 2015-02-26 MED ORDER — ONDANSETRON 4 MG PO TBDP: 4.0000 mg | ORAL_TABLET | Freq: Three times a day (TID) | ORAL | Status: DC | PRN

## 2015-02-26 NOTE — ED Notes (Signed)
Patient receive zofran 27m iv per ems prior to arrival to er. Patient states she feels some relief of the nausea

## 2015-02-26 NOTE — ED Provider Notes (Signed)
TIME SEEN: 2:45 AM  CHIEF COMPLAINT:  Vertigo, vomiting  HPI: Pt is a 68 y.o.  Female with history of insulin-dependent diabetes, hypertension, hyperlipidemia, intermittent vertigo that she has dealt with for the past 2 years who presents to the emergency department with vertigo that started tonight when she tried to get out of bed to the bathroom. States vertigo is worse with turning her head and sitting upright, standing. Describes it as a room is spinning sensation. No lightheadedness. No recent head injury. Not on anticoagulation. Denies chest pain or shortness of breath. Does always have some chronic abdominal pain but no new  abdominal pain today. Has also had some mild diarrhea. No fever. No numbness, tingling or focal weakness. 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 No ear pain or hearing loss.  ROS: See HPI Constitutional: no fever  Eyes: no drainage  ENT: no runny nose   Cardiovascular:  no chest pain  Resp: no SOB  GI: no vomiting GU: no dysuria Integumentary: no rash  Allergy: no hives  Musculoskeletal: no leg swelling  Neurological: no slurred speech ROS otherwise negative  PAST MEDICAL HISTORY/PAST SURGICAL HISTORY:  Past Medical History  Diagnosis Date  . Diabetes mellitus without complication   . IBS (irritable bowel syndrome)   . Anemia   . Ulcer   . Diabetic neuropathy   . Hypertension   . Hyperlipidemia   . Thyroid nodule   . Cataract     MEDICATIONS:  Prior to Admission medications   Medication Sig Start Date End Date Taking? Authorizing Provider  atorvastatin (LIPITOR) 40 MG tablet Take 1 tablet (40 mg total) by mouth daily. 01/19/15   Wardell Honour, MD  ferrous sulfate 325 (65 FE) MG tablet Take 1 tablet (325 mg total) by mouth daily with breakfast. 01/19/15   Wardell Honour, MD  glipiZIDE (GLUCOTROL) 10 MG tablet Take 1 tablet (10 mg total) by mouth daily. 01/19/15   Wardell Honour, MD  glucose blood test strip One Touch verio test strips. Test bid.  E11.9 12/07/14   Claretta Fraise, MD  insulin NPH Human (HUMULIN N,NOVOLIN N) 100 UNIT/ML injection 22 units in the morning and 15 units at night 01/19/15   Wardell Honour, MD  Insulin Syringes, Disposable, U-100 0.5 ML MISC Give insulin twice daily as directed 09/19/14   Mary-Margaret Hassell Done, FNP  lisinopril (PRINIVIL,ZESTRIL) 20 MG tablet Take 1 tablet (20 mg total) by mouth daily. 01/19/15   Wardell Honour, MD  Probiotic Product (PROBIOTIC DAILY PO) Take 1 tablet by mouth daily.     Historical Provider, MD    ALLERGIES:  Allergies  Allergen Reactions  . Metformin And Related     Severe diarrhea  . Asa [Aspirin]     GI Dr does not want her to take due to hx ulcers  . Nsaids     GI Dr does not want her to take due to hx of ulcers  . Other Diarrhea    Steroids    SOCIAL HISTORY:  History  Substance Use Topics  . Smoking status: Never Smoker   . Smokeless tobacco: Never Used  . Alcohol Use: No    FAMILY HISTORY: Family History  Problem Relation Age of Onset  . Hypertension Mother   . Stroke Mother   . Diabetes Father     w/ BKA  . Hypertension Father   . Cancer Sister   . Diabetes Sister   . Diabetes Brother   . Diabetes Sister   .  Cancer Sister     breast  . Hypertension Sister     EXAM: BP 184/84 mmHg  Pulse 76  Temp(Src) 97.9 F (36.6 C) (Oral)  Resp 16  Ht 5' 2.5" (1.588 m)  Wt 187 lb (84.823 kg)  BMI 33.64 kg/m2  SpO2 100% CONSTITUTIONAL: Alert and oriented and responds appropriately to questions. Well-appearing; well-nourished HEAD: Normocephalic EYES: Conjunctivae clear, PERRL , extraocular movements intact, patient has fatigable horizontal nystagmus bilaterally ENT: normal nose; no rhinorrhea; moist mucous membranes; pharynx without lesions noted , TMs are clear bilaterally, no signs of mastoiditis NECK: Supple, no meningismus, no LAD  CARD: RRR; S1 and S2 appreciated; no murmurs, no clicks, no rubs, no gallops RESP: Normal chest excursion without  splinting or tachypnea; breath sounds clear and equal bilaterally; no wheezes, no rhonchi, no rales, no hypoxia or respiratory distress, speaking full sentences ABD/GI: Normal bowel sounds; non-distended; soft, non-tender, no rebound, no guarding, no peritoneal signs BACK:  The back appears normal and is non-tender to palpation, there is no CVA tenderness EXT: Normal ROM in all joints; non-tender to palpation; no edema; normal capillary refill; no cyanosis, no calf tenderness or swelling    SKIN: Normal color for age and race; warm NEURO: Moves all extremities equally, sensation to light touch intact diffusely, cranial nerves II through XII intact PSYCH: The patient's mood and manner are appropriate. Grooming and personal hygiene are appropriate.  MEDICAL DECISION MAKING:  Patient here with vertigo that she has dealt with intermittently for the past 2 years. Worse with changes of position. Seems to be peripheral in nature. Otherwise neurologically intact. Will obtain labs, urine. Will treat symptomatically with Valium, IV fluids and Zofran. I doubt that this is a stroke given she has had similar symptoms intermittently for the past 2 years.   ED PROGRESS: 4:00 AM  Pt  Reports her nausea, vertigo have improved. Her labs show mild leukocytosis with left shift. Potassium mildly low at 3.0, will replace. Troponin negative. She is now complaining that she has had a headache since early this morning. Describes as gradual onset, moderate, diffuse, throbbing without radiation. Denies having similar headaches in the past. Because of this c/o HA I will obtain a head CT.   Patient's headache has improved with Tylenol. Head CT is unremarkable. She has been able to ambulate, drink. I feel she is safe to be discharged home with her family. We'll discharge with prescriptions for Valium to use at home as needed for vertigo, Zofran. We'll also give ENT follow-up information. Discussed return precautions and supportive  care instructions. Patient and family at bedside verbalize understanding and are comfortable with this plan.    EKG Interpretation  Date/Time:  Sunday February 26 2015 02:54:29 EDT Ventricular Rate:  78 PR Interval:  178 QRS Duration: 103 QT Interval:  404 QTC Calculation: 460 R Axis:   35 Text Interpretation:  Sinus rhythm Abnormal R-wave progression, early transition Borderline T wave abnormalities No significant change since EKG in 2001 Confirmed by WARD,  DO, KRISTEN [phone]) on 02/26/2015 2:57:00 AM        [place], DO 02/26/15 [id number]

## 2015-02-26 NOTE — Discharge Instructions (Signed)
Benign Positional Vertigo Vertigo means you feel like you or your surroundings are moving when they are not. Benign positional vertigo is the most common form of vertigo. Benign means that the cause of your condition is not serious. Benign positional vertigo is more common in older adults. CAUSES  Benign positional vertigo is the result of an upset in the labyrinth system. This is an area in the middle ear that helps control your balance. This may be caused by a viral infection, head injury, or repetitive motion. However, often no specific cause is found. SYMPTOMS  Symptoms of benign positional vertigo occur when you move your head or eyes in different directions. Some of the symptoms may include: 1. Loss of balance and falls. 2. Vomiting. 3. Blurred vision. 4. Dizziness. 5. Nausea. 6. Involuntary eye movements (nystagmus). DIAGNOSIS  Benign positional vertigo is usually diagnosed by physical exam. If the specific cause of your benign positional vertigo is unknown, your caregiver may perform imaging tests, such as magnetic resonance imaging (MRI) or computed tomography (CT). TREATMENT  Your caregiver may recommend movements or procedures to correct the benign positional vertigo. Medicines such as meclizine, benzodiazepines, and medicines for nausea may be used to treat your symptoms. In rare cases, if your symptoms are caused by certain conditions that affect the inner ear, you may need surgery. HOME CARE INSTRUCTIONS   Follow your caregiver's instructions.  Move slowly. Do not make sudden body or head movements.  Avoid driving.  Avoid operating heavy machinery.  Avoid performing any tasks that would be dangerous to you or others during a vertigo episode.  Drink enough fluids to keep your urine clear or pale yellow. SEEK IMMEDIATE MEDICAL CARE IF:   You develop problems with walking, weakness, numbness, or using your arms, hands, or legs.  You have difficulty speaking.  You develop  severe headaches.  Your nausea or vomiting continues or gets worse.  You develop visual changes.  Your family or friends notice any behavioral changes.  Your condition gets worse.  You have a fever.  You develop a stiff neck or sensitivity to light. MAKE SURE YOU:   Understand these instructions.  Will watch your condition.  Will get help right away if you are not doing well or get worse. Document Released: 05/06/2006 Document Revised: 10/21/2011 Document Reviewed: 04/18/2011 [facility] Patient Information 2015 [place], [place]. This information is not intended to replace advice given to you by your health care provider. Make sure you discuss any questions you have with your health care provider.  Epley Maneuver Self-Care WHAT IS THE EPLEY MANEUVER? The Epley maneuver is an exercise you can do to relieve symptoms of benign paroxysmal positional vertigo (BPPV). This condition is often just referred to as vertigo. BPPV is caused by the movement of tiny crystals (canaliths) inside your inner ear. The accumulation and movement of canaliths in your inner ear causes a sudden spinning sensation (vertigo) when you move your head to certain positions. Vertigo usually lasts about 30 seconds. BPPV usually occurs in just one ear. If you get vertigo when you lie on your left side, you probably have BPPV in your left ear. Your health care provider can tell you which ear is involved.  BPPV may be caused by a head injury. Many people older than 50 get BPPV for unknown reasons. If you have been diagnosed with BPPV, your health care provider may teach you how to do this maneuver. BPPV is not life threatening (benign) and usually goes away in time.  WHEN SHOULD I PERFORM THE EPLEY MANEUVER? You can do this maneuver at home whenever you have symptoms of vertigo. You may do the Epley maneuver up to 3 times a day until your symptoms of vertigo go away. HOW SHOULD I DO THE EPLEY MANEUVER? 7. Sit on the edge of a  bed or table with your back straight. Your legs should be extended or hanging over the edge of the bed or table.  8. Turn your head halfway toward the affected ear.  9. Lie backward quickly with your head turned until you are lying flat on your back. You may want to position a pillow under your shoulders.  10. Hold this position for 30 seconds. You may experience an attack of vertigo. This is normal. Hold this position until the vertigo stops. 11. Then turn your head to the opposite direction until your unaffected ear is facing the floor.  12. Hold this position for 30 seconds. You may experience an attack of vertigo. This is normal. Hold this position until the vertigo stops. 13. Now turn your whole body to the same side as your head. Hold for another 30 seconds.  14. You can then sit back up. ARE THERE RISKS TO THIS MANEUVER? In some cases, you may have other symptoms (such as changes in your vision, weakness, or numbness). If you have these symptoms, stop doing the maneuver and call your health care provider. Even if doing these maneuvers relieves your vertigo, you may still have dizziness. Dizziness is the sensation of light-headedness but without the sensation of movement. Even though the Epley maneuver may relieve your vertigo, it is possible that your symptoms will return within 5 years. WHAT SHOULD I DO AFTER THIS MANEUVER? After doing the Epley maneuver, you can return to your normal activities. Ask your doctor if there is anything you should do at home to prevent vertigo. This may include:  Sleeping with two or more pillows to keep your head elevated.  Not sleeping on the side of your affected ear.  Getting up slowly from bed.  Avoiding sudden movements during the day.  Avoiding extreme head movement, like looking up or bending over.  Wearing a cervical collar to prevent sudden head movements. WHAT SHOULD I DO IF MY SYMPTOMS GET WORSE? Call your health care provider if your  vertigo gets worse. Call your provider right way if you have other symptoms, including:   Nausea.  Vomiting.  Headache.  Weakness.  Numbness.  Vision changes. Document Released: 08/03/2013 Document Reviewed: 08/03/2013 [facility] Patient Information 2015 [place], [place]. This information is not intended to replace advice given to you by your health care provider. Make sure you discuss any questions you have with your health care provider.

## 2015-02-26 NOTE — ED Notes (Signed)
Pt to department via EMS.  N/V/D for aprox 1 hour.  Pt also reporting some dizziness and a headache.

## 2015-02-28 ENCOUNTER — Ambulatory Visit (INDEPENDENT_AMBULATORY_CARE_PROVIDER_SITE_OTHER): Payer: Medicare HMO | Admitting: Gastroenterology

## 2015-02-28 ENCOUNTER — Telehealth: Payer: Self-pay | Admitting: Family Medicine

## 2015-02-28 ENCOUNTER — Encounter: Payer: Self-pay | Admitting: Gastroenterology

## 2015-02-28 ENCOUNTER — Other Ambulatory Visit: Payer: Self-pay

## 2015-02-28 VITALS — BP 170/85 | HR 77 | Temp 96.9°F | Ht 62.0 in | Wt 183.8 lb

## 2015-02-28 DIAGNOSIS — R197 Diarrhea, unspecified: Secondary | ICD-10-CM | POA: Diagnosis not present

## 2015-02-28 DIAGNOSIS — D649 Anemia, unspecified: Secondary | ICD-10-CM | POA: Diagnosis not present

## 2015-02-28 DIAGNOSIS — D6489 Other specified anemias: Secondary | ICD-10-CM

## 2015-02-28 DIAGNOSIS — R42 Dizziness and giddiness: Secondary | ICD-10-CM

## 2015-02-28 MED ORDER — NA SULFATE-K SULFATE-MG SULF 17.5-3.13-1.6 GM/177ML PO SOLN: 1.0000 | ORAL | Status: DC

## 2015-02-28 NOTE — Assessment & Plan Note (Signed)
69 year old female with history of chronic diarrhea, worsening symptoms of the last 6 months. Extensive evaluation the past with noted small ulcerations in the distal ileum and right colon on multiple studies previously felt to be related to NSAIDs/ASA. Last colonoscopy 2011, biopsies with questionable Crohn's, patient was on aspirin at the time. She failed follow-up post procedure.  Last antibiotic use was in March. Doubt we are dealing with C. difficile or other infectious etiology but this needs to be excluded. Stool studies to be collected. Recent heme occult was negative. She has mild anemia in the setting of previously known blood disorder (used to follow with Dr. Sonny Dandy but not in years). No evidence of iron deficiency on anemia panel last year. Her hemoglobin has been fairly consistent over the last few years.  For ongoing diarrhea, previous history of small bowel/right colonic ulcers gradually related to NSAIDs but cannot exclude Crohn's, would offer her repeat colonoscopy with terminal ileoscopy with Dr. Gala Romney. Patient is agreeable.  I have discussed the risks, alternatives, benefits with regards to but not limited to the risk of reaction to medication, bleeding, infection, perforation and the patient is agreeable to proceed. Written consent to be obtained.  Based on findings, we can offer suggestions for control of symptoms.

## 2015-02-28 NOTE — Telephone Encounter (Signed)
Okay to refer to ENT

## 2015-02-28 NOTE — Progress Notes (Signed)
CC'ED TO PCP 

## 2015-02-28 NOTE — Progress Notes (Signed)
Primary Care Physician:  Wardell Honour, MD  Primary Gastroenterologist:  Garfield Cornea, MD   Chief Complaint  Patient presents with  . Diarrhea    has been up to 5-7 times a day    HPI:  Felicia Pugh is a 69 y.o. female here for further management of diarrhea. She was last seen in 2011 at time of colonoscopy. History somewhat complicated as outlined below the extensive evaluation.   Colonoscopy, May 21, 2005, Dr. Gala Romney. Ulcerated ileocecal valve. Terminal ileum unremarkable up to 15 cm. Biopsies nonspecific.  Small bowel [facility] capsule endoscopy, 06/10/2005, by Dr. Gala Romney. Possible single erosion versus AVM, distal ileum.  Small bowel G[facility]ens capsule endoscopy prior to this one, down in [place] reported erosions/ulcerations, I do not have the report  Colonoscopy, September 06 2009, suboptimal prep on the right side. Ulcers noted at cecum and in the ascending colon. Terminal ileum could not be intubated. Advised to stop aspirin. Biopsy showed chronic active colitis, ?Crohn's. Patient failed to follow-up post procedure as recommended.   Celiac antibody panel previously negative. [facility] IBD panel previously negative. Reportedly EGD previously with erosions in her bulb. Negative h.pylori serologies and CLO testing negative.   Patient presents back with complaints of ongoing diarrhea. Seems to be getting worse in the last 6 months. Most days has 5-7 stools. Positive nocturnal diarrhea. No blood in the stool. Stools are dark on iron. He restarted her iron back earlier this year. She no longer sees the hematologist. Little abdominal cramping prior to bowel movement but otherwise no significant abdominal pain. Her appetite is good. No vomiting. Belch a lot. No heartburn. No dysphagia. Never constipated. Last anabiotic use in March.   Current Outpatient Prescriptions  Medication Sig Dispense Refill  . atorvastatin (LIPITOR) 40 MG tablet Take 1 tablet (40 mg total) by mouth daily. 90  tablet 1  . diazepam (VALIUM) 5 MG tablet Take 0.5-1 tablets (2.5-5 mg total) by mouth every 8 (eight) hours as needed (dizziness). 15 tablet 0  . ferrous sulfate 325 (65 FE) MG tablet Take 1 tablet (325 mg total) by mouth daily with breakfast. 90 tablet 1  . glipiZIDE (GLUCOTROL) 10 MG tablet Take 1 tablet (10 mg total) by mouth daily. 90 tablet 1  . glucose blood test strip One Touch verio test strips. Test bid. E11.9 100 each 1  . insulin NPH Human (HUMULIN N,NOVOLIN N) 100 UNIT/ML injection 22 units in the morning and 15 units at night 10 mL 11  . Insulin Syringes, Disposable, U-100 0.5 ML MISC Give insulin twice daily as directed 100 each 1  . lisinopril (PRINIVIL,ZESTRIL) 20 MG tablet Take 1 tablet (20 mg total) by mouth daily. 90 tablet 1  . ondansetron (ZOFRAN ODT) 4 MG disintegrating tablet Take 1 tablet (4 mg total) by mouth every 8 (eight) hours as needed for nausea or vomiting. 20 tablet 0  . Na Sulfate-K Sulfate-Mg Sulf (SUPREP BOWEL PREP) SOLN Take 1 kit by mouth as directed. 1 Bottle 0   No current facility-administered medications for this visit.    Allergies as of 02/28/2015 - Review Complete 02/28/2015  Allergen Reaction Noted  . Metformin and related  05/11/2014  . Asa [aspirin]  11/04/2012  . Nsaids  11/04/2012  . Other Diarrhea 12/25/2014    Past Medical History  Diagnosis Date  . Diabetes mellitus without complication   . IBS (irritable bowel syndrome)   . Anemia     H/O myelodysplastic anemis? used to see Dr. Sonny Dandy, no  longer seeing anyone (02/2015)  . Ulcer   . Diabetic neuropathy   . Hypertension   . Hyperlipidemia   . Thyroid nodule   . Cataract   . Stroke 2011    Past Surgical History  Procedure Laterality Date  . Abdominal hysterectomy      partial  . Back surgery      x3 (Dr Loyola Mast, Dr Patrice Paradise)  . Colonoscopy  10/102006    RMR: normal  . Colonoscopy  09/06/2009    RMR: Suboptimal prep on the right side. Ulcers noted at cecum and in the descending  colon. Terminal ileum could not be intubated. Ounces with chronic active colitis, question Crohn's  . Small bowel capsule endoscopy  06/10/2005    RMR: single erosion versus AVM, distal ileum  . Small bowel capsule endoscopy  prior to 05/2005    [place]: reported erosions/ulcerations per medical record. actual report unavailable.  . Esophagogastroduodenoscopy      Family History  Problem Relation Age of Onset  . Hypertension Mother   . Stroke Mother   . Diabetes Father     w/ BKA  . Hypertension Father   . Cancer Sister   . Diabetes Sister     stomach  . Diabetes Brother   . Diabetes Sister   . Cancer Sister     breast  . Hypertension Sister   . Colon cancer Neg Hx   . Inflammatory bowel disease Neg Hx     History   Social History  . Marital Status: Married    Spouse Name: N/A  . Number of Children: 5  . Years of Education: N/A   Occupational History  . Not on file.   Social History Main Topics  . Smoking status: Never Smoker   . Smokeless tobacco: Never Used  . Alcohol Use: No  . Drug Use: No  . Sexual Activity: No   Other Topics Concern  . Not on file   Social History Narrative      ROS:  General: Negative for anorexia, weight loss, fever, chills, fatigue, weakness. Eyes: Negative for vision changes.  ENT: Negative for hoarseness, difficulty swallowing , nasal congestion. CV: Negative for chest pain, angina, palpitations, dyspnea on exertion, peripheral edema.  Respiratory: Negative for dyspnea at rest, dyspnea on exertion, cough, sputum, wheezing.  GI: See history of present illness. GU:  Negative for dysuria, hematuria, urinary incontinence, urinary frequency, nocturnal urination.  MS: Negative for joint pain, low back pain.  Derm: Negative for rash or itching.  Neuro: Negative for weakness, abnormal sensation, seizure, frequent headaches, memory loss, confusion. Recent vertigo, third episode this year Psych: Negative for anxiety, depression, suicidal  ideation, hallucinations.  Endo: Negative for unusual weight change.  Heme: Negative for bruising or bleeding. Allergy: Negative for rash or hives.    Physical Examination:  BP 170/85 mmHg  Pulse 77  Temp(Src) 96.9 F (36.1 C)  Ht 5' 2"  (1.575 m)  Wt 183 lb 12.8 oz (83.371 kg)  BMI 33.61 kg/m2   General: Well-nourished, well-developed in no acute distress. Accompanied by sister Head: Normocephalic, atraumatic.   Eyes: Conjunctiva pink, no icterus. Mouth: Oropharyngeal mucosa moist and pink , no lesions erythema or exudate. Neck: Supple without thyromegaly, masses, or lymphadenopathy.  Lungs: Clear to auscultation bilaterally.  Heart: Regular rate and rhythm, no murmurs rubs or gallops.  Abdomen: Bowel sounds are normal, nontender, nondistended, no hepatosplenomegaly or masses, no abdominal bruits or    hernia , no rebound or guarding.  Rectal: Deferred Extremities: No lower extremity edema. No clubbing or deformities.  Neuro: Alert and oriented x 4 , grossly normal neurologically.  Skin: Warm and dry, no rash or jaundice.   Psych: Alert and cooperative, normal mood and affect.  Labs: Lab Results  Component Value Date   WBC 10.6* 02/26/2015   HGB 10.9* 02/26/2015   HCT 34.7* 02/26/2015   MCV 88.7 02/26/2015   PLT 266 02/26/2015   Lab Results  Component Value Date   CREATININE 0.69 02/26/2015   BUN 15 02/26/2015   NA 142 02/26/2015   K 3.0* 02/26/2015   CL 107 02/26/2015   CO2 27 02/26/2015   Lab Results  Component Value Date   ALT 15 02/26/2015   AST 19 02/26/2015   ALKPHOS 112 02/26/2015   BILITOT 0.5 02/26/2015   Heme negative 02/06/15   Lab Results  Component Value Date   TSH 2.650 01/19/2015     Imaging Studies: Ct Head Wo Contrast  02/26/2015   CLINICAL DATA:  Increasing dizziness, greater when lying down for 1 day. Nausea, vomiting, and diarrhea for 1 day. Weakness.  EXAM: CT HEAD WITHOUT CONTRAST  TECHNIQUE: Contiguous axial images were obtained  from the base of the skull through the vertex without intravenous contrast.  COMPARISON:  11/20/2013  FINDINGS: Ventricles and sulci appear symmetrical. Mild patchy low-attenuation changes in the deep white matter consistent with small vessel ischemia. Vascular calcifications. No mass effect or midline shift. No abnormal extra-axial fluid collections. Gray-white matter junctions are distinct. Basal cisterns are not effaced. No evidence of acute intracranial hemorrhage. No depressed skull fractures. Retention cyst in the left maxillary antrum. Paranasal sinuses and mastoid air cells are otherwise not opacified. 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  IMPRESSION: No acute intracranial abnormalities. Mild small vessel ischemic changes.   Electronically Signed   By: Lucienne Capers M.D.   On: 02/26/2015 04:42

## 2015-02-28 NOTE — Patient Instructions (Signed)
Please collect your stool studies and drop off at labs as soon as possible.  Colonoscopy as scheduled. See separate instructions.

## 2015-03-01 ENCOUNTER — Other Ambulatory Visit: Payer: Self-pay | Admitting: Gastroenterology

## 2015-03-01 NOTE — Telephone Encounter (Signed)
Patient aware that referral has been set up and she will hear from [place] with an appointment.

## 2015-03-02 LAB — GIARDIA/CRYPTOSPORIDIUM (EIA)
CRYPTOSPORIDIUM SCREEN (EIA) (SOL): NEGATIVE
GIARDIA SCREEN (EIA): NEGATIVE

## 2015-03-02 LAB — CLOSTRIDIUM DIFFICILE BY PCR: CDIFFPCR: NOT DETECTED

## 2015-03-02 NOTE — Progress Notes (Signed)
Quick Note:  Stool studies negative. Procedure as planned. ______

## 2015-03-05 LAB — STOOL CULTURE

## 2015-03-06 NOTE — Progress Notes (Signed)
Quick Note:  PT is aware of results and to proceed with procedure. ______

## 2015-03-14 ENCOUNTER — Telehealth: Payer: Self-pay | Admitting: Family Medicine

## 2015-03-14 NOTE — Telephone Encounter (Signed)
Done for 6 months on 01/19/15. Pt aware

## 2015-03-27 ENCOUNTER — Encounter (HOSPITAL_COMMUNITY): Payer: Self-pay

## 2015-03-27 ENCOUNTER — Telehealth: Payer: Self-pay | Admitting: General Practice

## 2015-03-27 ENCOUNTER — Encounter (HOSPITAL_COMMUNITY)
Admission: RE | Disposition: A | Discharge status: Home / Self Care | Payer: Self-pay | Source: Ambulatory Visit | Attending: Internal Medicine

## 2015-03-27 ENCOUNTER — Ambulatory Visit (HOSPITAL_COMMUNITY)
Admission: RE | Admit: 2015-03-27 | Discharge: 2015-03-27 | Disposition: A | Discharge status: Home / Self Care | Payer: Medicare HMO | Source: Ambulatory Visit | Attending: Internal Medicine | Admitting: Internal Medicine

## 2015-03-27 DIAGNOSIS — E785 Hyperlipidemia, unspecified: Secondary | ICD-10-CM | POA: Diagnosis not present

## 2015-03-27 DIAGNOSIS — K633 Ulcer of intestine: Secondary | ICD-10-CM | POA: Diagnosis not present

## 2015-03-27 DIAGNOSIS — K5730 Diverticulosis of large intestine without perforation or abscess without bleeding: Secondary | ICD-10-CM | POA: Diagnosis not present

## 2015-03-27 DIAGNOSIS — K639 Disease of intestine, unspecified: Secondary | ICD-10-CM | POA: Diagnosis not present

## 2015-03-27 DIAGNOSIS — I10 Essential (primary) hypertension: Secondary | ICD-10-CM | POA: Diagnosis not present

## 2015-03-27 DIAGNOSIS — K529 Noninfective gastroenteritis and colitis, unspecified: Secondary | ICD-10-CM | POA: Diagnosis not present

## 2015-03-27 DIAGNOSIS — Z794 Long term (current) use of insulin: Secondary | ICD-10-CM | POA: Diagnosis not present

## 2015-03-27 DIAGNOSIS — E1140 Type 2 diabetes mellitus with diabetic neuropathy, unspecified: Secondary | ICD-10-CM | POA: Diagnosis not present

## 2015-03-27 DIAGNOSIS — D6489 Other specified anemias: Secondary | ICD-10-CM

## 2015-03-27 DIAGNOSIS — R197 Diarrhea, unspecified: Secondary | ICD-10-CM

## 2015-03-27 HISTORY — DX: Other complications of anesthesia, initial encounter: T88.59XA

## 2015-03-27 HISTORY — PX: COLONOSCOPY: SHX5424

## 2015-03-27 HISTORY — DX: Adverse effect of unspecified anesthetic, initial encounter: T41.45XA

## 2015-03-27 LAB — GLUCOSE, CAPILLARY: Glucose-Capillary: 146 mg/dL — ABNORMAL HIGH (ref 65–99)

## 2015-03-27 SURGERY — COLONOSCOPY
Anesthesia: Moderate Sedation

## 2015-03-27 MED ORDER — ONDANSETRON HCL 4 MG/2ML IJ SOLN
INTRAMUSCULAR | Status: AC
  Filled 2015-03-27: qty 2

## 2015-03-27 MED ORDER — MEPERIDINE HCL 100 MG/ML IJ SOLN
INTRAMUSCULAR | Status: DC | PRN
  Administered 2015-03-27: 50 mg via INTRAVENOUS
  Administered 2015-03-27: 25 mg via INTRAVENOUS

## 2015-03-27 MED ORDER — MIDAZOLAM HCL 5 MG/5ML IJ SOLN
INTRAMUSCULAR | Status: DC | PRN
  Administered 2015-03-27 (×2): 1 mg via INTRAVENOUS
  Administered 2015-03-27: 2 mg via INTRAVENOUS

## 2015-03-27 MED ORDER — MEPERIDINE HCL 100 MG/ML IJ SOLN
INTRAMUSCULAR | Status: AC
  Filled 2015-03-27: qty 2

## 2015-03-27 MED ORDER — STERILE WATER FOR IRRIGATION IR SOLN
Status: DC | PRN
  Administered 2015-03-27: 10:00:00

## 2015-03-27 MED ORDER — ONDANSETRON HCL 4 MG/2ML IJ SOLN
INTRAMUSCULAR | Status: DC | PRN
  Administered 2015-03-27: 4 mg via INTRAVENOUS

## 2015-03-27 MED ORDER — SODIUM CHLORIDE 0.9 % IV SOLN
INTRAVENOUS | Status: DC
  Administered 2015-03-27: 10:00:00 via INTRAVENOUS

## 2015-03-27 MED ORDER — MIDAZOLAM HCL 5 MG/5ML IJ SOLN
INTRAMUSCULAR | Status: AC
  Filled 2015-03-27: qty 10

## 2015-03-27 NOTE — Telephone Encounter (Signed)
Patient called in stating RMR sent in two Rx after the tcs today, however one of the medication was $400.  This was sent to the [facility] in [place].

## 2015-03-27 NOTE — Op Note (Signed)
[place], [id number]   COLONOSCOPY PROCEDURE REPORT  PATIENT: Felicia, Pugh  MR#: [id number] BIRTHDATE: Dec 19, 1945 , 13  yrs. old GENDER: female ENDOSCOPIST: R.  Garfield Cornea, MD FACP [facility] REFERRED [id number] Sabra Heck, M.D.  Edwinna Areola, M.D. PROCEDURE DATE:  2015/04/09 PROCEDURE:   Ileo-Colonoscopy, with biopsy INDICATIONS:History of chronic diarrhea; history of a abnormal right colon; negative stool studies recently. MEDICATIONS: Versed 4 mg IV and Demerol 75 mg IV in divided doses. Zofran 4 mg IV. ASA CLASS:       Class II  CONSENT: The risks, benefits, alternatives and imponderables including but not limited to bleeding, perforation as well as the possibility of a missed lesion have been reviewed.  The potential for biopsy, lesion removal, etc. have also been discussed. Questions have been answered.  All parties agreeable.  Please see the history and physical in the medical record for more information.  DESCRIPTION OF PROCEDURE:   After the risks benefits and alternatives of the procedure were thoroughly explained, informed consent was obtained.  The digital rectal exam revealed no abnormalities of the rectum.   The [id number] ([id number])  endoscope was introduced through the anus and advanced to the terminal ileum which was intubated for a short distance. No adverse events experienced.   The quality of the prep was adequate  The instrument was then slowly withdrawn as the colon was fully examined. Estimated blood loss is zero unless otherwise noted in this procedure report.      COLON FINDINGS: Normal-appearing rectum.  Scattered left-sided diverticula; the patient had aphthous ulceration at the mouth of the ileocecal valve and multiple scattered 2-3 mm aphthous appearing ulcers at the opening to the appendiceal orifice and in satellite distribution in the base of the cecum.  The remainder of the colonic mucosa appeared  normal.  I intubated the terminal ileum a good 15 cm.  This segment the GI tract appear normal.  The ileocecal valve/cecal ulcers were biopsied.  Retroflexion was performed. .  Withdrawal time=14 minutes 0 seconds.  The scope was withdrawn and the procedure completed. COMPLICATIONS: There were no immediate complications. EBL 5 mL ENDOSCOPIC IMPRESSION: Abnormal cecum and ileocecal valve most consistent with inflammatory bowel disease. Patient absolutely denies NSAID use. I doubt ischemia given the paucity of abdominal pain  RECOMMENDATIONS: Begin Lialda 4.8 g daily (creatinine 0.69 last month). Follow-up on pathology. Office visit with [place] in 3 months  eSigned:  R. Garfield Cornea, MD Rosalita Chessman [facility] 04-09-15 10:51 AM   cc:  CPT CODES: ICD CODES:  The ICD and CPT codes recommended by this software are interpretations from the data that the clinical staff has captured with the software.  The verification of the translation of this report to the ICD and CPT codes and modifiers is the sole responsibility of the health care institution and practicing physician where this report was generated.  [employer] will not be held responsible for the validity of the ICD and CPT codes included on this report.  AMA assumes no liability for data contained or not contained herein. CPT is a Designer, television/film set of the [employer].  PATIENT NAME:  Felicia, Pugh MR#: [id number]

## 2015-03-27 NOTE — Discharge Instructions (Signed)
Colonoscopy Discharge Instructions  Read the instructions outlined below and refer to this sheet in the next few weeks. These discharge instructions provide you with general information on caring for yourself after you leave the hospital. Your doctor may also give you specific instructions. While your treatment has been planned according to the most current medical practices available, unavoidable complications occasionally occur. If you have any problems or questions after discharge, call Dr. Gala Romney at [phone]. ACTIVITY  You may resume your regular activity, but move at a slower pace for the next 24 hours.   Take frequent rest periods for the next 24 hours.   Walking will help get rid of the air and reduce the bloated feeling in your belly (abdomen).   No driving for 24 hours (because of the medicine (anesthesia) used during the test).    Do not sign any important legal documents or operate any machinery for 24 hours (because of the anesthesia used during the test).  NUTRITION  Drink plenty of fluids.   You may resume your normal diet as instructed by your doctor.   Begin with a light meal and progress to your normal diet. Heavy or fried foods are harder to digest and may make you feel sick to your stomach (nauseated).   Avoid alcoholic beverages for 24 hours or as instructed.  MEDICATIONS  You may resume your normal medications unless your doctor tells you otherwise.  WHAT YOU CAN EXPECT TODAY  Some feelings of bloating in the abdomen.   Passage of more gas than usual.   Spotting of blood in your stool or on the toilet paper.  IF YOU HAD POLYPS REMOVED DURING THE COLONOSCOPY:  No aspirin products for 7 days or as instructed.   No alcohol for 7 days or as instructed.   Eat a soft diet for the next 24 hours.  FINDING OUT THE RESULTS OF YOUR TEST Not all test results are available during your visit. If your test results are not back during the visit, make an appointment  with your caregiver to find out the results. Do not assume everything is normal if you have not heard from your caregiver or the medical facility. It is important for you to follow up on all of your test results.  SEEK IMMEDIATE MEDICAL ATTENTION IF:  You have more than a spotting of blood in your stool.   Your belly is swollen (abdominal distention).   You are nauseated or vomiting.   You have a temperature over 101.   You have abdominal pain or discomfort that is severe or gets worse throughout the day.   You likely have a type of inflammatory bowel disease-probably Crohn's disease  Begin Lialda 4.8 g daily to calm down the inflammation in your colon  Further recommendations to follow pending review of pathology report  Office visit with [place] in 3 months.  Crohn Disease Crohn disease is a long-term (chronic) soreness and redness (inflammation) of the intestines (bowel). It can affect any portion of the digestive tract, from the mouth to the anus. It can also cause problems outside the digestive tract. Crohn disease is closely related to a disease called ulcerative colitis (together, these two diseases are called inflammatory bowel disease).  CAUSES  The cause of Crohn disease is not known. One [employer] Snuffer is that, in an easily affected person, the immune system is triggered to attack the body's own digestive tissue. Crohn disease runs in families. It seems to be more common in certain geographic areas  and amongst certain races. There are no clear-cut dietary causes.  SYMPTOMS  Crohn disease can cause many different symptoms since it can affect many different parts of the body. Symptoms include:  Fatigue.  Weight loss.  Chronic diarrhea, sometime bloody.  Abdominal pain and cramps.  Fever.  Ulcers or canker sores in the mouth or rectum.  Anemia (low red blood cells).  Arthritis, skin problems, and eye problems may occur. Complications of Crohn disease can include:  Series of  holes (perforation) of the bowel.  Portions of the intestines sticking to each other (adhesions).  Obstruction of the bowel.  Fistula formation, typically in the rectal area but also in other areas. A fistula is an opening between the bowels and the outside, or between the bowels and another organ.  A painful crack in the mucous membrane of the anus (rectal fissure). DIAGNOSIS  Your caregiver may suspect Crohn disease based on your symptoms and an exam. Blood tests may confirm that there is a problem. You may be asked to submit a stool specimen for examination. X-rays and CT scans may be necessary. Ultimately, the diagnosis is usually made after a procedure that uses a flexible tube that is inserted via your mouth or your anus. This is done under sedation and is called either an upper endoscopy or colonoscopy. With these tests, the specialist can take tiny tissue samples and remove them from the inside of the bowel (biopsy). Examination of this biopsy tissue under a microscope can reveal Crohn disease as the cause of your symptoms. Due to the many different forms that Crohn disease can take, symptoms may be present for several years before a diagnosis is made. TREATMENT  Medications are often used to decrease inflammation and control the immune system. These include medicines related to aspirin, steroid medications, and newer and stronger medications to slow down the immune system. Some medications may be used as suppositories or enemas. A number of other medications are used or have been studied. Your caregiver will make specific recommendations. HOME CARE INSTRUCTIONS   Symptoms such as diarrhea can be controlled with medications. Avoid foods that have a laxative effect such as fresh fruit, vegetables, and dairy products. During flare-ups, you can rest your bowel by refraining from solid foods. Drink clear liquids frequently during the day. (Electrolyte or rehydrating fluids are best. Your  caregiver can help you with suggestions.) Drink often to prevent loss of body fluids (dehydration). When diarrhea has cleared, eat small meals and more frequently. Avoid food additives and stimulants such as caffeine (coffee, tea, or chocolate). Enzyme supplements may help if you develop intolerance to a sugar in dairy products (lactose). Ask your caregiver or dietitian about specific dietary instructions.  Try to maintain a positive attitude. Learn relaxation techniques such as self-hypnosis, mental imaging, and muscle relaxation.  If possible, avoid stresses which can aggravate your condition.  Exercise regularly.  Follow your diet.  Always get plenty of rest. SEEK MEDICAL CARE IF:   Your symptoms fail to improve after a week or two of new treatment.  You experience continued weight loss.  You have ongoing cramps or loose bowels.  You develop a new skin rash, skin sores, or eye problems. SEEK IMMEDIATE MEDICAL CARE IF:   You have worsening of your symptoms or develop new symptoms.  You have a fever.  You develop bloody diarrhea.  You develop severe abdominal pain. MAKE SURE YOU:   Understand these instructions.  Will watch your condition.  Will get help  right away if you are not doing well or get worse. Document Released: 05/08/2005 Document Revised: 12/13/2013 Document Reviewed: 04/06/2007 [facility] Patient Information 2015 [place], [place]. This information is not intended to replace advice given to you by your health care provider. Make sure you discuss any questions you have with your health care provider.

## 2015-03-27 NOTE — H&P (View-Only) (Signed)
Primary Care Physician:  Wardell Honour, MD  Primary Gastroenterologist:  Garfield Cornea, MD   Chief Complaint  Patient presents with  . Diarrhea    has been up to 5-7 times a day    HPI:  Felicia Pugh is a 69 y.o. female here for further management of diarrhea. She was last seen in 2011 at time of colonoscopy. History somewhat complicated as outlined below the extensive evaluation.   Colonoscopy, May 21, 2005, Dr. Gala Romney. Ulcerated ileocecal valve. Terminal ileum unremarkable up to 15 cm. Biopsies nonspecific.  Small bowel [facility] capsule endoscopy, 06/10/2005, by Dr. Gala Romney. Possible single erosion versus AVM, distal ileum.  Small bowel [facility]ens capsule endoscopy prior to this one, down in [place] reported erosions/ulcerations, I do not have the report  Colonoscopy, September 06 2009, suboptimal prep on the right side. Ulcers noted at cecum and in the ascending colon. Terminal ileum could not be intubated. Advised to stop aspirin. Biopsy showed chronic active colitis, ?Crohn's. Patient failed to follow-up post procedure as recommended.   Celiac antibody panel previously negative. [facility] IBD panel previously negative. Reportedly EGD previously with erosions in her bulb. Negative h.pylori serologies and CLO testing negative.   Patient presents back with complaints of ongoing diarrhea. Seems to be getting worse in the last 6 months. Most days has 5-7 stools. Positive nocturnal diarrhea. No blood in the stool. Stools are dark on iron. He restarted her iron back earlier this year. She no longer sees the hematologist. Little abdominal cramping prior to bowel movement but otherwise no significant abdominal pain. Her appetite is good. No vomiting. Belch a lot. No heartburn. No dysphagia. Never constipated. Last anabiotic use in March.   Current Outpatient Prescriptions  Medication Sig Dispense Refill  . atorvastatin (LIPITOR) 40 MG tablet Take 1 tablet (40 mg total) by mouth daily. 90  tablet 1  . diazepam (VALIUM) 5 MG tablet Take 0.5-1 tablets (2.5-5 mg total) by mouth every 8 (eight) hours as needed (dizziness). 15 tablet 0  . ferrous sulfate 325 (65 FE) MG tablet Take 1 tablet (325 mg total) by mouth daily with breakfast. 90 tablet 1  . glipiZIDE (GLUCOTROL) 10 MG tablet Take 1 tablet (10 mg total) by mouth daily. 90 tablet 1  . glucose blood test strip One Touch verio test strips. Test bid. E11.9 100 each 1  . insulin NPH Human (HUMULIN N,NOVOLIN N) 100 UNIT/ML injection 22 units in the morning and 15 units at night 10 mL 11  . Insulin Syringes, Disposable, U-100 0.5 ML MISC Give insulin twice daily as directed 100 each 1  . lisinopril (PRINIVIL,ZESTRIL) 20 MG tablet Take 1 tablet (20 mg total) by mouth daily. 90 tablet 1  . ondansetron (ZOFRAN ODT) 4 MG disintegrating tablet Take 1 tablet (4 mg total) by mouth every 8 (eight) hours as needed for nausea or vomiting. 20 tablet 0  . Na Sulfate-K Sulfate-Mg Sulf (SUPREP BOWEL PREP) SOLN Take 1 kit by mouth as directed. 1 Bottle 0   No current facility-administered medications for this visit.    Allergies as of 02/28/2015 - Review Complete 02/28/2015  Allergen Reaction Noted  . Metformin and related  05/11/2014  . Asa [aspirin]  11/04/2012  . Nsaids  11/04/2012  . Other Diarrhea 12/25/2014    Past Medical History  Diagnosis Date  . Diabetes mellitus without complication   . IBS (irritable bowel syndrome)   . Anemia     H/O myelodysplastic anemis? used to see Dr. Sonny Dandy, no  longer seeing anyone (02/2015)  . Ulcer   . Diabetic neuropathy   . Hypertension   . Hyperlipidemia   . Thyroid nodule   . Cataract   . Stroke 2011    Past Surgical History  Procedure Laterality Date  . Abdominal hysterectomy      partial  . Back surgery      x3 (Dr Loyola Mast, Dr Patrice Paradise)  . Colonoscopy  10/102006    RMR: normal  . Colonoscopy  09/06/2009    RMR: Suboptimal prep on the right side. Ulcers noted at cecum and in the descending  colon. Terminal ileum could not be intubated. Ounces with chronic active colitis, question Crohn's  . Small bowel capsule endoscopy  06/10/2005    RMR: single erosion versus AVM, distal ileum  . Small bowel capsule endoscopy  prior to 05/2005    [place]: reported erosions/ulcerations per medical record. actual report unavailable.  . Esophagogastroduodenoscopy      Family History  Problem Relation Age of Onset  . Hypertension Mother   . Stroke Mother   . Diabetes Father     w/ BKA  . Hypertension Father   . Cancer Sister   . Diabetes Sister     stomach  . Diabetes Brother   . Diabetes Sister   . Cancer Sister     breast  . Hypertension Sister   . Colon cancer Neg Hx   . Inflammatory bowel disease Neg Hx     History   Social History  . Marital Status: Married    Spouse Name: N/A  . Number of Children: 5  . Years of Education: N/A   Occupational History  . Not on file.   Social History Main Topics  . Smoking status: Never Smoker   . Smokeless tobacco: Never Used  . Alcohol Use: No  . Drug Use: No  . Sexual Activity: No   Other Topics Concern  . Not on file   Social History Narrative      ROS:  General: Negative for anorexia, weight loss, fever, chills, fatigue, weakness. Eyes: Negative for vision changes.  ENT: Negative for hoarseness, difficulty swallowing , nasal congestion. CV: Negative for chest pain, angina, palpitations, dyspnea on exertion, peripheral edema.  Respiratory: Negative for dyspnea at rest, dyspnea on exertion, cough, sputum, wheezing.  GI: See history of present illness. GU:  Negative for dysuria, hematuria, urinary incontinence, urinary frequency, nocturnal urination.  MS: Negative for joint pain, low back pain.  Derm: Negative for rash or itching.  Neuro: Negative for weakness, abnormal sensation, seizure, frequent headaches, memory loss, confusion. Recent vertigo, third episode this year Psych: Negative for anxiety, depression, suicidal  ideation, hallucinations.  Endo: Negative for unusual weight change.  Heme: Negative for bruising or bleeding. Allergy: Negative for rash or hives.    Physical Examination:  BP 170/85 mmHg  Pulse 77  Temp(Src) 96.9 F (36.1 C)  Ht 5' 2"  (1.575 m)  Wt 183 lb 12.8 oz (83.371 kg)  BMI 33.61 kg/m2   General: Well-nourished, well-developed in no acute distress. Accompanied by sister Head: Normocephalic, atraumatic.   Eyes: Conjunctiva pink, no icterus. Mouth: Oropharyngeal mucosa moist and pink , no lesions erythema or exudate. Neck: Supple without thyromegaly, masses, or lymphadenopathy.  Lungs: Clear to auscultation bilaterally.  Heart: Regular rate and rhythm, no murmurs rubs or gallops.  Abdomen: Bowel sounds are normal, nontender, nondistended, no hepatosplenomegaly or masses, no abdominal bruits or    hernia , no rebound or guarding.  Rectal: Deferred Extremities: No lower extremity edema. No clubbing or deformities.  Neuro: Alert and oriented x 4 , grossly normal neurologically.  Skin: Warm and dry, no rash or jaundice.   Psych: Alert and cooperative, normal mood and affect.  Labs: Lab Results  Component Value Date   WBC 10.6* 02/26/2015   HGB 10.9* 02/26/2015   HCT 34.7* 02/26/2015   MCV 88.7 02/26/2015   PLT 266 02/26/2015   Lab Results  Component Value Date   CREATININE 0.69 02/26/2015   BUN 15 02/26/2015   NA 142 02/26/2015   K 3.0* 02/26/2015   CL 107 02/26/2015   CO2 27 02/26/2015   Lab Results  Component Value Date   ALT 15 02/26/2015   AST 19 02/26/2015   ALKPHOS 112 02/26/2015   BILITOT 0.5 02/26/2015   Heme negative 02/06/15   Lab Results  Component Value Date   TSH 2.650 01/19/2015     Imaging Studies: Ct Head Wo Contrast  02/26/2015   CLINICAL DATA:  Increasing dizziness, greater when lying down for 1 day. Nausea, vomiting, and diarrhea for 1 day. Weakness.  EXAM: CT HEAD WITHOUT CONTRAST  TECHNIQUE: Contiguous axial images were obtained  from the base of the skull through the vertex without intravenous contrast.  COMPARISON:  11/20/2013  FINDINGS: Ventricles and sulci appear symmetrical. Mild patchy low-attenuation changes in the deep white matter consistent with small vessel ischemia. Vascular calcifications. No mass effect or midline shift. No abnormal extra-axial fluid collections. Gray-white matter junctions are distinct. Basal cisterns are not effaced. No evidence of acute intracranial hemorrhage. No depressed skull fractures. Retention cyst in the left maxillary antrum. Paranasal sinuses and mastoid air cells are otherwise not opacified. 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  IMPRESSION: No acute intracranial abnormalities. Mild small vessel ischemic changes.   Electronically Signed   By: Lucienne Capers M.D.   On: 02/26/2015 04:42

## 2015-03-27 NOTE — Interval H&P Note (Signed)
History and Physical Interval Note:  03/27/2015 10:14 AM  Felicia Pugh  has presented today for surgery, with the diagnosis of DIARRHEA/ANEMIA  The various methods of treatment have been discussed with the patient and family. After consideration of risks, benefits and other options for treatment, the patient has consented to  Procedure(s) with comments: COLONOSCOPY (N/A) - 1100 as a surgical intervention .  The patient's history has been reviewed, patient examined, no change in status, stable for surgery.  I have reviewed the patient's chart and labs.  Questions were answered to the patient's satisfaction.     Felicia Pugh  No change. C. difficile toxin assay negative. Persisting nonbloody diarrhea. Ileocolonoscopy per plan.The risks, benefits, limitations, alternatives and imponderables have been reviewed with the patient. Questions have been answered. All parties are agreeable.

## 2015-03-27 NOTE — Telephone Encounter (Signed)
Patient can't afford the Lialda that was sent in for her.  Please advise?  Routing to [employer]

## 2015-03-28 NOTE — Telephone Encounter (Signed)
Per pts formulary- lialda (pentasa, delzicol, and asachol) are all tier 4.   apriso is tier 3  Balsalazide and sulfasalazine is tier 2.   Do you want to change medications?

## 2015-03-29 NOTE — Telephone Encounter (Signed)
Pt called- she started having some diarrhea again last night. She has been unable to get the rx for lialda because its too expensive. Gave pt #9 boxes of lialda and some pt assistance forms to hold in case she needed them. I informed pt that RMR was not in the office today and that he may want to change her to another medication on her formulary, and that I would call her when he decided.

## 2015-03-30 NOTE — Telephone Encounter (Signed)
Pt is aware. Please schedule ov.

## 2015-03-30 NOTE — Telephone Encounter (Signed)
MADE PT EARLIER APPOINTMENT AND CALLED HER   SHE IS AWARE OF DATE AND TIME

## 2015-03-30 NOTE — Telephone Encounter (Signed)
Leave her on Lialda for now (samples); arrange f/u w [place] in 2-3 weeks. ? Transition to Delzicol vs Colazol

## 2015-03-31 ENCOUNTER — Encounter (HOSPITAL_COMMUNITY): Payer: Self-pay | Admitting: Internal Medicine

## 2015-04-02 ENCOUNTER — Encounter: Payer: Self-pay | Admitting: Internal Medicine

## 2015-04-05 ENCOUNTER — Encounter: Payer: Self-pay | Admitting: Gastroenterology

## 2015-04-05 ENCOUNTER — Ambulatory Visit (INDEPENDENT_AMBULATORY_CARE_PROVIDER_SITE_OTHER): Payer: Medicare HMO | Admitting: Gastroenterology

## 2015-04-05 VITALS — BP 150/80 | HR 79 | Temp 97.2°F | Ht 62.5 in | Wt 182.0 lb

## 2015-04-05 DIAGNOSIS — K5090 Crohn's disease, unspecified, without complications: Secondary | ICD-10-CM | POA: Diagnosis not present

## 2015-04-05 MED ORDER — BUDESONIDE 3 MG PO CPEP: 9.0000 mg | ORAL_CAPSULE | Freq: Every day | ORAL | Status: DC

## 2015-04-05 NOTE — Patient Instructions (Signed)
Start taking entocort 3 capsules each morning. I have sent this to the pharmacy. We may have to discuss with your insurance company your need for this medication, but I am getting the ball rolling. Your pharmacy was not able to tell me the price until after they process the prescription. DO NOT pick it up if it is too expensive; we can figure out a way to get this more cost efficient.  For maintenance medication, I need to start you on something different than Lialda. We are calling your insurance plan to see what the coverage will be. For now, start taking entocort 3 capsules a day.   I would like to see you back in 4 weeks.

## 2015-04-05 NOTE — Progress Notes (Signed)
Referring Provider: Wardell Honour, MD Primary Care Physician:  Wardell Honour, MD  Primary GI: Dr. Gala Romney   Chief Complaint  Patient presents with  . Follow-up    HPI:   Felicia Pugh is a 69 y.o. female presenting today in follow-up after recent colonoscopy performed secondary to chronic diarrhea. Findings consistent with likely IBD (Crohn's), showing abnormal cecum and IC valve. Terminal ileum intubated and appeared normal. Was started on Lialda by samples but insurance not covering this. She has also not had any improvement in her symptoms with Lialda. Insurance will cover apriso as tier 3, balsalazide and sulfasalazine as tier 2. Per Dr. Roseanne Kaufman recommendations, transition to Delzicol vs balsalazide.   Notes postprandial diarrhea, worse with some food choices. Tossed salad: 4 loose stools. Has to wear a depend all the time. Has incontinence. No rectal bleeding. Taking iron pills. Has abdominal cramping with BM. lialda without any improvement.   Past Medical History  Diagnosis Date  . Diabetes mellitus without complication   . IBS (irritable bowel syndrome)   . Anemia     H/O myelodysplastic anemis? used to see Dr. Sonny Dandy, no longer seeing anyone (02/2015)  . Ulcer   . Diabetic neuropathy   . Hypertension   . Hyperlipidemia   . Thyroid nodule   . Cataract   . Stroke 2011  . Complication of anesthesia     difficulty breathing after waking up from anesthesia    Past Surgical History  Procedure Laterality Date  . Abdominal hysterectomy      partial  . Back surgery      x3 (Dr Loyola Mast, Dr Patrice Paradise)  . Colonoscopy  10/102006    RMR: normal  . Colonoscopy  09/06/2009    RMR: Suboptimal prep on the right side. Ulcers noted at cecum and in the descending colon. Terminal ileum could not be intubated. Ounces with chronic active colitis, question Crohn's  . Small bowel capsule endoscopy  06/10/2005    RMR: single erosion versus AVM, distal ileum  . Small bowel capsule  endoscopy  prior to 05/2005    [place]: reported erosions/ulcerations per medical record. actual report unavailable.  . Esophagogastroduodenoscopy    . Breast surgery Right 2002    cyst removed  . Colonoscopy N/A 03/27/2015    Dr. Gala Romney: abnormal cecum and IC valve most consistent with IBD. TI intubated and appeared normal. Likely Crohn's disease. Patient denies NSAID use. No improvement with Lialda samples at time of initial diagnosis    Current Outpatient Prescriptions  Medication Sig Dispense Refill  . atorvastatin (LIPITOR) 40 MG tablet Take 1 tablet (40 mg total) by mouth daily. 90 tablet 1  . Cinnamon 500 MG TABS Take 1 tablet by mouth daily.    . ferrous sulfate 325 (65 FE) MG tablet Take 1 tablet (325 mg total) by mouth daily with breakfast. 90 tablet 1  . glipiZIDE (GLUCOTROL) 10 MG tablet Take 1 tablet (10 mg total) by mouth daily. 90 tablet 1  . glucose blood test strip One Touch verio test strips. Test bid. E11.9 100 each 1  . insulin NPH Human (HUMULIN N,NOVOLIN N) 100 UNIT/ML injection 22 units in the morning and 15 units at night 10 mL 11  . Insulin Syringes, Disposable, U-100 0.5 ML MISC Give insulin twice daily as directed 100 each 1  . lisinopril (PRINIVIL,ZESTRIL) 20 MG tablet Take 1 tablet (20 mg total) by mouth daily. 90 tablet 1  . budesonide (ENTOCORT EC) 3  MG 24 hr capsule Take 3 capsules (9 mg total) by mouth daily. 90 capsule 1   No current facility-administered medications for this visit.    Allergies as of 04/05/2015 - Review Complete 03/27/2015  Allergen Reaction Noted  . Metformin and related  05/11/2014  . Asa [aspirin]  11/04/2012  . Nsaids  11/04/2012  . Other Diarrhea 12/25/2014    Family History  Problem Relation Age of Onset  . Hypertension Mother   . Stroke Mother   . Diabetes Father     w/ BKA  . Hypertension Father   . Cancer Sister   . Diabetes Sister     stomach  . Diabetes Brother   . Diabetes Sister   . Cancer Sister     breast  .  Hypertension Sister   . Colon cancer Neg Hx   . Inflammatory bowel disease Neg Hx     Social History   Social History  . Marital Status: Married    Spouse Name: N/A  . Number of Children: 5  . Years of Education: N/A   Social History Main Topics  . Smoking status: Never Smoker   . Smokeless tobacco: Never Used  . Alcohol Use: No  . Drug Use: No  . Sexual Activity: No   Other Topics Concern  . None   Social History Narrative    Review of Systems: As mentioned in HPI  Physical Exam: BP 150/80 mmHg  Pulse 79  Temp(Src) 97.2 F (36.2 C) (Oral)  Ht 5' 2.5" (1.588 m)  Wt 182 lb (82.555 kg)  BMI 32.74 kg/m2 General:   Alert and oriented. No distress noted. Pleasant and cooperative.  Head:  Normocephalic and atraumatic. Eyes:  Conjuctiva clear without scleral icterus. Mouth:  Oral mucosa pink and moist. Good dentition. No lesions. Abdomen:  +BS, soft, non-tender and non-distended. No rebound or guarding. No HSM or masses noted. Msk:  Symmetrical without gross deformities. Normal posture. Extremities:  Without edema. Neurologic:  Alert and  oriented x4;  grossly normal neurologically. Psych:  Alert and cooperative. Normal mood and affect.  Lab Results  Component Value Date   CREATININE 0.69 02/26/2015   BUN 15 02/26/2015   NA 142 02/26/2015   K 3.0* 02/26/2015   CL 107 02/26/2015   CO2 27 02/26/2015

## 2015-04-12 ENCOUNTER — Telehealth: Payer: Self-pay | Admitting: Gastroenterology

## 2015-04-12 ENCOUNTER — Encounter: Payer: Self-pay | Admitting: Gastroenterology

## 2015-04-12 DIAGNOSIS — K5090 Crohn's disease, unspecified, without complications: Secondary | ICD-10-CM | POA: Insufficient documentation

## 2015-04-12 MED ORDER — BALSALAZIDE DISODIUM 750 MG PO CAPS: 2250.0000 mg | ORAL_CAPSULE | Freq: Three times a day (TID) | ORAL | Status: DC

## 2015-04-12 NOTE — Telephone Encounter (Signed)
Let's get a PR on patient.   I have sent in colazal 3 capsules TID. Please have her start this. It should be covered by her insurance. Continue Entocort. We will start weaning off of that at her next appt.

## 2015-04-12 NOTE — Assessment & Plan Note (Signed)
69 year old female with new diagnosis of Crohn's colitis and no improvement with Lialda; however, insurance is not covering lialda at a cost-effective price (400$). This is tier 4. Will start on entocort 9 mg daily in the hopes of inducing remission and introduce a lower tier medication. Per Dr. Roseanne Kaufman recommendations, trial Delzicol vs Colazal. As Delzicol is the same tier as Lialda and would be same price that is unaffordable, will proceed with Colazal. Return in 4 weeks for close follow-up

## 2015-04-12 NOTE — Progress Notes (Signed)
CC'D TO PCP °

## 2015-04-13 NOTE — Telephone Encounter (Signed)
SOOO glad!

## 2015-04-13 NOTE — Telephone Encounter (Signed)
Called pt- she said she is feeling much better now. She is aware of the medication change and will pick it up today. She will also continue taking the entocort until she comes in for her next ov for further directions.

## 2015-04-18 ENCOUNTER — Telehealth: Payer: Self-pay

## 2015-04-18 ENCOUNTER — Other Ambulatory Visit: Payer: Self-pay | Admitting: Gastroenterology

## 2015-04-18 ENCOUNTER — Encounter: Payer: Self-pay | Admitting: Family Medicine

## 2015-04-18 ENCOUNTER — Ambulatory Visit (INDEPENDENT_AMBULATORY_CARE_PROVIDER_SITE_OTHER): Payer: Medicare HMO | Admitting: Family Medicine

## 2015-04-18 ENCOUNTER — Other Ambulatory Visit: Payer: Self-pay

## 2015-04-18 VITALS — BP 156/80 | HR 66 | Temp 97.2°F | Ht 62.5 in | Wt 179.0 lb

## 2015-04-18 DIAGNOSIS — E785 Hyperlipidemia, unspecified: Secondary | ICD-10-CM | POA: Diagnosis not present

## 2015-04-18 DIAGNOSIS — K5090 Crohn's disease, unspecified, without complications: Secondary | ICD-10-CM

## 2015-04-18 DIAGNOSIS — R197 Diarrhea, unspecified: Secondary | ICD-10-CM

## 2015-04-18 DIAGNOSIS — I10 Essential (primary) hypertension: Secondary | ICD-10-CM | POA: Diagnosis not present

## 2015-04-18 DIAGNOSIS — E119 Type 2 diabetes mellitus without complications: Secondary | ICD-10-CM

## 2015-04-18 LAB — POCT GLYCOSYLATED HEMOGLOBIN (HGB A1C): HEMOGLOBIN A1C: 7.1

## 2015-04-18 MED ORDER — HYDROCHLOROTHIAZIDE 12.5 MG PO TABS: 12.5000 mg | ORAL_TABLET | Freq: Every day | ORAL | Status: DC

## 2015-04-18 NOTE — Telephone Encounter (Signed)
Pt called- she has not been taking her medicine correctly. She was taking the entocort 10m bid instead of qd. She said she read the bottle wrong. She has also started on the colazal Friday (she finished the samples of lialda that she had before starting colazal). Pt said since she started the colazal and the correct dosage of entocort, she started having diarrhea again. She has had 9 episodes this morning of uncontrollable diarrhea. She wants to know what she should do? She is going to the beach this week but wont be able to go if she is still having diarrhea.

## 2015-04-18 NOTE — Telephone Encounter (Signed)
Recent stool studies negative.  Let's hold off on repeat stool studies. Hx of diabetes. Could have co-existing condition along with IBD (?IBS, diabetic enteropathy, etc). Trial low dose 2 mg imodium once each morning. HOLD if constipation.

## 2015-04-18 NOTE — Telephone Encounter (Signed)
Pt is aware. She said she would come today to pick up container and lab orders.  Container and lab orders at the front for her to pick up.

## 2015-04-18 NOTE — Progress Notes (Signed)
Subjective:    Patient ID: Felicia Pugh, female    DOB: 10-11-1945, 69 y.o.   MRN: [id number]  HPI 69 year old African-American female who is here to follow-up diabetes and hypertension and hyperlipidemia. She has recently been diagnosed with Crohn's disease and is in the process of finding medicine that her insurance will cover that helps her symptoms. She is not there yet.  Regarding her diabetes. She takes NPH insulin twice a day 22 and 15 units a.m. and p.m. respectively as well as glipizide 10 mg. Her last A1c was 7.7 suggesting we can do better. Her blood pressures of been a little high at 150/80 last visit and 156/80 today    Review of Systems  Constitutional: Negative.   Respiratory: Negative.   Cardiovascular: Negative.   Neurological: Negative.   Hematological: Negative.   Psychiatric/Behavioral: Negative.        Patient Active Problem List   Diagnosis Date Noted  . Crohn's disease 04/12/2015  . Mucosal abnormality of colon   . Diverticulosis of colon without hemorrhage   . Anemia 02/28/2015  . History of stroke 07/08/2014  . Osteopenia 05/11/2014  . Diabetes mellitus without complication 01/75/1025  . Hyperlipidemia with target LDL less than 100 06/01/2008  . Anemia, chronic disease 06/01/2008  . Essential hypertension 06/01/2008  . HEMORRHOIDS, INTERNAL 06/01/2008  . IRRITABLE BOWEL SYNDROME 06/01/2008  . HEMOCCULT POSITIVE STOOL 06/01/2008  . 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 WEIGHT GAIN 06/01/2008  . FECAL INCONTINENCE 06/01/2008  . DIARRHEA 06/01/2008   Outpatient Encounter Prescriptions as of 04/18/2015  Medication Sig  . atorvastatin (LIPITOR) 40 MG tablet Take 1 tablet (40 mg total) by mouth daily.  . balsalazide (COLAZAL) 750 MG capsule Take 3 capsules (2,250 mg total) by mouth 3 (three) times daily.  . budesonide (ENTOCORT EC) 3 MG 24 hr capsule Take 3 capsules (9 mg total) by mouth daily.  . Cinnamon 500 MG TABS Take 1 tablet by mouth daily.  . ferrous  sulfate 325 (65 FE) MG tablet Take 1 tablet (325 mg total) by mouth daily with breakfast.  . glipiZIDE (GLUCOTROL) 10 MG tablet Take 1 tablet (10 mg total) by mouth daily.  [name] glucose blood test strip One Touch verio test strips. Test bid. E11.9  . insulin NPH Human (HUMULIN N,NOVOLIN N) 100 UNIT/ML injection 22 units in the morning and 15 units at night  . Insulin Syringes, Disposable, U-100 0.5 ML MISC Give insulin twice daily as directed  . lisinopril (PRINIVIL,ZESTRIL) 20 MG tablet Take 1 tablet (20 mg total) by mouth daily.   No facility-administered encounter medications on file as of 04/18/2015.    Objective:   Physical Exam  Constitutional: She is oriented to person, place, and time. She appears well-developed and well-nourished.  Cardiovascular: Normal rate, regular rhythm and intact distal pulses.   Pulmonary/Chest: Effort normal and breath sounds normal.  Abdominal: Soft.  Neurological: She is alert and oriented to person, place, and time.  Psychiatric: She has a normal mood and affect. Her behavior is normal.          Assessment & Plan:  1. Diabetes mellitus without complication As noted above, A1c is not at goal. Depending on today's value we may need to make some adjustments - POCT glycosylated hemoglobin (Hb A1C)  2. Essential hypertension Add diarrhetic to lisinopril to help control blood pressure. We'll begin with hydrochlorthiazide 12.5 mg and then combine the lisinopril hydrochlorothiazide into 1 pill  3. Hyperlipidemia with target LDL less than 100  We are at goal with LDL less than 100  4. Crohn's disease, without complications Symptoms have improved on current regimen. We are letting gastroenterology manage her medications  Wardell Honour MD

## 2015-04-18 NOTE — Telephone Encounter (Signed)
Let's check a Cdiff real quick.  IF that is negative, I can start her on some Bentyl.

## 2015-04-18 NOTE — Telephone Encounter (Signed)
Pt is aware.  

## 2015-04-20 ENCOUNTER — Ambulatory Visit: Payer: Medicare HMO | Admitting: Gastroenterology

## 2015-04-25 ENCOUNTER — Ambulatory Visit: Payer: Medicare HMO | Admitting: Family Medicine

## 2015-05-09 ENCOUNTER — Ambulatory Visit: Payer: Medicare HMO | Admitting: Gastroenterology

## 2015-05-16 ENCOUNTER — Encounter: Payer: Self-pay | Admitting: Neurology

## 2015-05-16 ENCOUNTER — Ambulatory Visit (INDEPENDENT_AMBULATORY_CARE_PROVIDER_SITE_OTHER): Payer: Medicare HMO | Admitting: Neurology

## 2015-05-16 VITALS — BP 146/83 | HR 69 | Ht 62.0 in | Wt 176.2 lb

## 2015-05-16 DIAGNOSIS — K50919 Crohn's disease, unspecified, with unspecified complications: Secondary | ICD-10-CM

## 2015-05-16 DIAGNOSIS — R42 Dizziness and giddiness: Secondary | ICD-10-CM | POA: Insufficient documentation

## 2015-05-16 DIAGNOSIS — E119 Type 2 diabetes mellitus without complications: Secondary | ICD-10-CM | POA: Diagnosis not present

## 2015-05-16 DIAGNOSIS — E785 Hyperlipidemia, unspecified: Secondary | ICD-10-CM

## 2015-05-16 DIAGNOSIS — I10 Essential (primary) hypertension: Secondary | ICD-10-CM

## 2015-05-16 DIAGNOSIS — E1169 Type 2 diabetes mellitus with other specified complication: Secondary | ICD-10-CM | POA: Insufficient documentation

## 2015-05-16 MED ORDER — MECLIZINE HCL 25 MG PO TABS: 25.0000 mg | ORAL_TABLET | Freq: Three times a day (TID) | ORAL | Status: DC | PRN

## 2015-05-16 NOTE — Patient Instructions (Signed)
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 You will let us know when you are ready. Of course basilar migraine and other conditions are on the differentials, will continue to monitor - will prescribe meclizine as needed for vertigo - do the maneuver when vertigo happens - continue other home medication - follow up with GI  - follow up in 3 months.

## 2015-05-16 NOTE — Progress Notes (Signed)
NEUROLOGY CLINIC NEW PATIENT NOTE  NAME: Felicia Pugh DOB: October 28, 1945 REFERRING PHYSICIAN: Wardell Honour, MD  I saw Felicia Pugh as a new consult in the neurovascular clinic today regarding  Chief Complaint  Patient presents with  . Referral    new patient  .  HPI: EMAREE CHIU is a 69 y.o. female with PMH of DM, HLD, HTN, Crohn's disease who presents as a new patient for Vertigo episode.   Patient stated that for the last 8-10 years, she has on and off vertigo, average once a year. Initially symptoms mild with room spinning sensation lasting only 2-3 seconds with mild nausea. Later, symptoms since getting longer, lasting 1-2 days, sometimes with nausea and vomiting. One episode even lasted about 2 weeks. It could happen when she was working in the [employer] or she was working in the office on standing position. She was giving dizziness medication, it seems helped, but she cannot remember the name.  She also had 2 episodes this year while in March and one in July, seems more severe lasting about 7 days. Both times triggered by getting up from bed and going to bathroom. Both times room spinning, nausea vomiting, open eyes making vertigo worse. She denies any headache. Denies any history of migraine or hearing loss. Admits that she has right-sided year ringing of the time. In between episodes, she was asymptomatic. 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 She was referred to neurology for further evaluation.  She had a history of diabetes, hyperlipidemia, hypertension, and as per patient they were in good control. She was also recently diagnosed with Crohn's disease, was started recently on medication.  Denies any smoking, alcohol or illicit drugs.  Past Medical History  Diagnosis Date  . Diabetes mellitus without complication ([place])   . IBS  (irritable bowel syndrome)   . Anemia     H/O myelodysplastic anemis? used to see Dr. Sonny Dandy, no longer seeing anyone (02/2015)  . Ulcer   . Diabetic neuropathy ([place])   . Hypertension   . Hyperlipidemia   . Thyroid nodule   . Cataract   . Stroke [facility] 2011  . Complication of anesthesia     difficulty breathing after waking up from anesthesia  . Crohn's disease ([place])   . Syncope and collapse    Past Surgical History  Procedure Laterality Date  . Abdominal hysterectomy      partial  . Back surgery      x3 (Dr Loyola Mast, Dr Patrice Paradise)  . Colonoscopy  10/102006    RMR: normal  . Colonoscopy  09/06/2009    RMR: Suboptimal prep on the right side. Ulcers noted at cecum and in the descending colon. Terminal ileum could not be intubated. Ounces with chronic active colitis, question Crohn's  . Small bowel capsule endoscopy  06/10/2005    RMR: single erosion versus AVM, distal ileum  . Small bowel capsule endoscopy  prior to 05/2005    [place]: reported erosions/ulcerations per medical record. actual report unavailable.  . Esophagogastroduodenoscopy    . Breast surgery Right 2002    cyst removed  . Colonoscopy N/A 03/27/2015    Dr. Gala Romney: abnormal cecum and IC valve most consistent with IBD. TI intubated and appeared normal. Likely Crohn's disease. Patient denies NSAID use. No improvement with Lialda samples at time of initial diagnosis   Family History  Problem Relation Age of Onset  . Hypertension Mother   . Stroke Mother   . Diabetes Father     w/ BKA  . Hypertension Father   . Cancer Sister   . Diabetes Sister     stomach  . Diabetes Brother   . Diabetes Sister   . Cancer Sister     breast  . Hypertension Sister   . Colon cancer Neg Hx   . Inflammatory bowel disease Neg Hx    Current Outpatient Prescriptions  Medication Sig Dispense Refill  . atorvastatin (LIPITOR) 40 MG tablet Take 1 tablet (40 mg total) by mouth daily. 90 tablet 1  . balsalazide (COLAZAL) 750 MG capsule Take 3  capsules (2,250 mg total) by mouth 3 (three) times daily. 270 capsule 3  . budesonide (ENTOCORT EC) 3 MG 24 hr capsule Take 3 capsules (9 mg total) by mouth daily. 90 capsule 1  . Cinnamon 500 MG TABS Take 1 tablet by mouth daily.    . ferrous sulfate 325 (65 FE) MG tablet Take 1 tablet (325 mg total) by mouth daily with breakfast. 90 tablet 1  . glipiZIDE (GLUCOTROL) 10 MG tablet Take 1 tablet (10 mg total) by mouth daily. 90 tablet 1  . glucose blood test strip One Touch verio test strips. Test bid. E11.9 100 each 1  . hydrochlorothiazide (HYDRODIURIL) 12.5 MG tablet     . insulin NPH Human (HUMULIN N,NOVOLIN N) 100 UNIT/ML injection 22 units in the morning and 15 units at night 10 mL 11  . Insulin Syringes, Disposable, U-100 0.5 ML MISC Give insulin twice daily as directed 100 each 1  . lisinopril (PRINIVIL,ZESTRIL) 20 MG tablet Take 1 tablet (20 mg total) by mouth daily. 90 tablet 1  . meclizine (ANTIVERT) 25 MG tablet Take 1 tablet (25 mg total) by mouth 3 (three) times daily as needed for dizziness. 30 tablet 0   No current facility-administered medications for this visit.   Allergies  Allergen Reactions  . Metformin And Related     Severe diarrhea  . Asa [Aspirin]     GI Dr does not want her to take due to hx ulcers  . Nsaids     GI Dr does not want her to take due to hx of ulcers  . Other Diarrhea    Steroids   Social History   Social History  . Marital Status: Married    Spouse Name: N/A  . Number of Children: 5  . Years of Education: N/A   Occupational History  . Not on file.   Social History Main Topics  . Smoking status: Never Smoker   . Smokeless tobacco: Never Used  . Alcohol Use: No  . Drug Use: No  . Sexual Activity: No   Other Topics Concern  . Not on file   Social History Narrative    Review of Systems Full 14 system review of systems performed and notable only for those listed, all others are neg:  Constitutional:  Fatigue Cardiovascular:    Ear/Nose/Throat:  Ringing years, spinning sensation Skin:  Eyes:  Blurry vision Respiratory:   Gastroitestinal:  Diarrhea Genitourinary:  Hematology/Lymphatic:   Endocrine:  Musculoskeletal:  Cramps Allergy/Immunology:   Neurological:  Memory loss, headache, numbness, dizziness Psychiatric: Decreased energy Sleep:   Physical Exam  Filed Vitals:   05/16/15 1518  BP: 146/83  Pulse: 85    General - Well nourished, well developed, in no apparent distress.  Ophthalmologic - Sharp disc margins OU.  Cardiovascular - Regular rate and rhythm with no murmur. Carotid pulses were 2+ without bruits .   Neck - supple, no nuchal rigidity .  Mental Status -  Level of arousal and orientation to time, place, and person were intact. Language including expression, naming, repetition, comprehension, reading, and writing was assessed and found intact. Attention span and concentration were normal. Recent and remote memory were intact. Fund of Knowledge was assessed and was intact.  Cranial Nerves II - XII - II - Visual field intact OU. III, IV, VI - Extraocular movements intact. V - Facial sensation intact bilaterally. VII - Facial movement intact bilaterally. VIII - Hearing & vestibular intact bilaterally. X - Palate elevates symmetrically. XI - Chin turning & shoulder shrug intact bilaterally. XII - Tongue protrusion intact.  Motor Strength - The patient's strength was normal in all extremities and pronator drift was absent.  Bulk was normal and fasciculations were absent.   Motor Tone - Muscle tone was assessed at the neck and appendages and was normal.  Reflexes - The patient's reflexes were normal in all extremities and she had no pathological reflexes.  Sensory - Light touch, temperature/pinprick, vibration and proprioception, and Romberg testing were assessed and were normal.    Coordination - The patient had normal movements in the hands and feet with no ataxia or dysmetria.   Tremor was absent.  Gait and Station - The patient's transfers, posture, gait, station, and turns were observed as normal. Dix-Hallpike maneuver negative.   Imaging  I have personally reviewed the radiological images below and agree with the radiology interpretations.  CT head 02/26/2015 - no acute intracranial abnormalities. Mild small vessel ischemic changes.  CT head 11/20/2013 - no acute intracranial abnormalities. Mild small vessel ischemic changes.  Lab Review none    Assessment and Plan:   In summary, Felicia Pugh is a 69 y.o. female with PMH of DM, HTN, HLD, Crohn's disease presents as a new patient for episodic vertigo for the last 8-10 years. Initial vertigo lasting briefly, not clearly related to position changes, however recent 2 episode lasting longer, with nausea vomiting but clearly related to positioning changes. Has constant right-sided tinnitus. Denies any headache, hearing loss. DDX including BPPV, basilar type of migraine, and vestibular paroxysmia. Dix-Hallpike negative. Will refer to PT for BPPV maneuver training, patient would like to start after stabilization of Crohn's disease treatment. Meclizine when necessary. Even with basilar type of migraine, not recommending medication due to infrequency.  - PT maneuver training. Will hold her once patient ready. - meclizine as needed for vertigo - continue other home medication - follow up with GI  - follow up in 3 months.  Thank you very much for the opportunity to participate in the care of this patient.  Please do not hesitate to call if any questions or concerns arise.  No orders of the defined types were placed in this encounter.    Meds ordered this encounter  Medications  . hydrochlorothiazide (HYDRODIURIL) 12.5 MG tablet    Sig:   . meclizine (ANTIVERT) 25 MG tablet    Sig: Take 1 tablet (25 mg total) by mouth 3 (three) times daily as needed for dizziness.    Dispense:  30 tablet    Refill:  0     Patient Instructions  - your vertigo likely to be BPPV and you may need PT maneuver training. You will let us know when you are ready. Of course basilar migraine and other conditions are on the  differentials, will continue to monitor - will prescribe meclizine as needed for vertigo - do the maneuver when vertigo happens - continue other home medication - follow up with GI  - follow up in 3 months.   Rosalin Hawking, MD PhD [facility] [address] [place], [place] [address] [phone]

## 2015-05-17 ENCOUNTER — Encounter: Payer: Self-pay | Admitting: Gastroenterology

## 2015-05-17 ENCOUNTER — Ambulatory Visit (INDEPENDENT_AMBULATORY_CARE_PROVIDER_SITE_OTHER): Payer: Medicare HMO | Admitting: Gastroenterology

## 2015-05-17 VITALS — BP 145/83 | HR 72 | Temp 98.4°F | Ht 62.0 in | Wt 176.2 lb

## 2015-05-17 DIAGNOSIS — K5090 Crohn's disease, unspecified, without complications: Secondary | ICD-10-CM | POA: Diagnosis not present

## 2015-05-17 MED ORDER — BUDESONIDE 3 MG PO CPEP: ORAL_CAPSULE | ORAL | Status: DC

## 2015-05-17 NOTE — Patient Instructions (Signed)
Continue the entocort in your bottle, 3 pills a day, until it is all gone.  On the 18th, start taking 2 capsules a day for 2 weeks, then 1 capsule a day for 1 week, then stop. I have sent in the refill for this.   Please keep a food/stool diary. I have included a handout on lactose-free diet and basic Crohn's information.   Please call us in 2 weeks with an update.   I would like to see you in 4-6 weeks.

## 2015-05-17 NOTE — Progress Notes (Signed)
Referring Provider: Wardell Honour, MD Primary Care Physician:  Wardell Honour, MD  Primary GI: Dr. Gala Romney   Chief Complaint  Patient presents with  . Diarrhea    HPI:   Felicia Pugh is a 69 y.o. female presenting today with a history of newly diagnosed Crohn's, insurance not approving Lialda. Prescribed Colazal 04/05/15 at last office visit and started on entocort 9 mg in the hope of inducing remission.   Returns stating she mistakenly took entocort BID originally but is now back to once daily. Still with intermittent watery diarrhea, some days worse than others. No rectal bleeding. States she goes a few times in the morning, large amounts, then tapers throughout the day. Abdominal cramping intermittently. Imodium without help. Feels like certain things she eats is exacerbating symptoms. Some days when she eats mayo, she notes worsening frequency. Some days only 2-3 stool. Trying to pinpoint what exacerbates. Has kept a detailed stool diary and brought with her today.   Past Medical History  Diagnosis Date  . Diabetes mellitus without complication ([place])   . IBS (irritable bowel syndrome)   . Anemia     H/O myelodysplastic anemis? used to see Dr. Sonny Dandy, no longer seeing anyone (02/2015)  . Ulcer   . Diabetic neuropathy ([place])   . Hypertension   . Hyperlipidemia   . Thyroid nodule   . Cataract   . Stroke [facility]) 2011  . Complication of anesthesia     difficulty breathing after waking up from anesthesia  . Crohn's disease ([place])   . Syncope and collapse     Past Surgical History  Procedure Laterality Date  . Abdominal hysterectomy      partial  . Back surgery      x3 (Dr Loyola Mast, Dr Patrice Paradise)  . Colonoscopy  10/102006    RMR: normal  . Colonoscopy  09/06/2009    RMR: Suboptimal prep on the right side. Ulcers noted at cecum and in the descending colon. Terminal ileum could not be intubated. Ounces with chronic active colitis, question Crohn's  . Small bowel capsule endoscopy   06/10/2005    RMR: single erosion versus AVM, distal ileum  . Small bowel capsule endoscopy  prior to 05/2005    [place]: reported erosions/ulcerations per medical record. actual report unavailable.  . Esophagogastroduodenoscopy    . Breast surgery Right 2002    cyst removed  . Colonoscopy N/A 03/27/2015    Dr. Gala Romney: abnormal cecum and IC valve most consistent with IBD. TI intubated and appeared normal. Likely Crohn's disease. Patient denies NSAID use. No improvement with Lialda samples at time of initial diagnosis    Current Outpatient Prescriptions  Medication Sig Dispense Refill  . atorvastatin (LIPITOR) 40 MG tablet Take 1 tablet (40 mg total) by mouth daily. 90 tablet 1  . balsalazide (COLAZAL) 750 MG capsule Take 3 capsules (2,250 mg total) by mouth 3 (three) times daily. 270 capsule 3  . budesonide (ENTOCORT EC) 3 MG 24 hr capsule Take 3 capsules (9 mg total) by mouth daily. 90 capsule 1  . Cinnamon 500 MG TABS Take 1 tablet by mouth daily.    . ferrous sulfate 325 (65 FE) MG tablet Take 1 tablet (325 mg total) by mouth daily with breakfast. 90 tablet 1  . glipiZIDE (GLUCOTROL) 10 MG tablet Take 1 tablet (10 mg total) by mouth daily. 90 tablet 1  . glucose blood test strip One Touch verio test strips. Test bid. E11.9 100 each 1  .  hydrochlorothiazide (HYDRODIURIL) 12.5 MG tablet     . insulin NPH Human (HUMULIN N,NOVOLIN N) 100 UNIT/ML injection 22 units in the morning and 15 units at night 10 mL 11  . Insulin Syringes, Disposable, U-100 0.5 ML MISC Give insulin twice daily as directed 100 each 1  . lisinopril (PRINIVIL,ZESTRIL) 20 MG tablet Take 1 tablet (20 mg total) by mouth daily. 90 tablet 1  . meclizine (ANTIVERT) 25 MG tablet Take 1 tablet (25 mg total) by mouth 3 (three) times daily as needed for dizziness. 30 tablet 0   No current facility-administered medications for this visit.    Allergies as of 05/17/2015 - Review Complete 05/17/2015  Allergen Reaction Noted  .  Metformin and related  05/11/2014  . Asa [aspirin]  11/04/2012  . Nsaids  11/04/2012  . Other Diarrhea 12/25/2014    Family History  Problem Relation Age of Onset  . Hypertension Mother   . Stroke Mother   . Diabetes Father     w/ BKA  . Hypertension Father   . Cancer Sister   . Diabetes Sister     stomach  . Diabetes Brother   . Diabetes Sister   . Cancer Sister     breast  . Hypertension Sister   . Colon cancer Neg Hx   . Inflammatory bowel disease Neg Hx     Social History   Social History  . Marital Status: Married    Spouse Name: N/A  . Number of Children: 5  . Years of Education: N/A   Social History Main Topics  . Smoking status: Never Smoker   . Smokeless tobacco: Never Used  . Alcohol Use: No  . Drug Use: No  . Sexual Activity: No   Other Topics Concern  . None   Social History Narrative    Review of Systems: As mentioned in HPI   Physical Exam: BP 145/83 mmHg  Pulse 72  Temp(Src) 98.4 F (36.9 C) (Oral)  Ht 5' 2"  (1.575 m)  Wt 176 lb 3.2 oz (79.924 kg)  BMI 32.22 kg/m2 General:   Alert and oriented. No distress noted. Pleasant and cooperative.  Head:  Normocephalic and atraumatic. Eyes:  Conjuctiva clear without scleral icterus. Mouth:  Oral mucosa pink and moist. Good dentition. No lesions. Abdomen:  +BS, soft, non-tender and non-distended. No rebound or guarding. No HSM or masses noted. Msk:  Symmetrical without gross deformities. Normal posture. Extremities:  Without edema. Neurologic:  Alert and  oriented x4;  grossly normal neurologically. Psych:  Alert and cooperative. Normal mood and affect.

## 2015-05-24 NOTE — Assessment & Plan Note (Addendum)
New diagnosis and unable to take Lialda due to insurance coverage (Tier 4). Since interim of last visit, entocort 9 mg daily started with some slight improvement overall. Colazal now on board instead of Lialda. After review of her stool diary, it appears she has spans of good days with intermittent exacerbations. I question a dietary component and have asked her to keep a food/stool diary and call me in 2 weeks. For now, will start weaning off the entocort in near future, continue colazal, and keep close follow-up. Avoid lactose   Entocort 9 mg daily through the 17th, then 2 capsules daily for 2 weeks then 1 capsule daily for 2 weeks then stop.

## 2015-05-24 NOTE — Progress Notes (Signed)
CC'ED TO PCP 

## 2015-05-30 ENCOUNTER — Telehealth: Payer: Self-pay

## 2015-05-30 NOTE — Telephone Encounter (Signed)
Pt is calling to let us know that she has been watching what she eats and the diarrhea is a little bit better.

## 2015-06-01 ENCOUNTER — Ambulatory Visit: Payer: Medicare HMO | Admitting: *Deleted

## 2015-06-01 VITALS — BP 136/84 | HR 75

## 2015-06-01 DIAGNOSIS — I10 Essential (primary) hypertension: Secondary | ICD-10-CM

## 2015-06-01 NOTE — Progress Notes (Signed)
Pt here for BP check

## 2015-06-05 NOTE — Telephone Encounter (Signed)
Great! We will keep her scheduled to return in November.

## 2015-06-13 DIAGNOSIS — R69 Illness, unspecified: Secondary | ICD-10-CM | POA: Diagnosis not present

## 2015-06-16 DIAGNOSIS — R69 Illness, unspecified: Secondary | ICD-10-CM | POA: Diagnosis not present

## 2015-06-19 DIAGNOSIS — H25813 Combined forms of age-related cataract, bilateral: Secondary | ICD-10-CM | POA: Diagnosis not present

## 2015-06-19 DIAGNOSIS — Z0100 Encounter for examination of eyes and vision without abnormal findings: Secondary | ICD-10-CM | POA: Diagnosis not present

## 2015-06-19 LAB — HM DIABETES EYE EXAM

## 2015-06-21 ENCOUNTER — Ambulatory Visit (INDEPENDENT_AMBULATORY_CARE_PROVIDER_SITE_OTHER): Payer: Medicare HMO | Admitting: Gastroenterology

## 2015-06-21 ENCOUNTER — Other Ambulatory Visit: Payer: Self-pay | Admitting: Gastroenterology

## 2015-06-21 ENCOUNTER — Encounter: Payer: Self-pay | Admitting: Gastroenterology

## 2015-06-21 VITALS — BP 134/84 | HR 83 | Temp 97.5°F | Ht 62.0 in | Wt 175.2 lb

## 2015-06-21 DIAGNOSIS — D649 Anemia, unspecified: Secondary | ICD-10-CM

## 2015-06-21 DIAGNOSIS — R634 Abnormal weight loss: Secondary | ICD-10-CM

## 2015-06-21 DIAGNOSIS — R197 Diarrhea, unspecified: Secondary | ICD-10-CM | POA: Diagnosis not present

## 2015-06-21 LAB — CBC
HCT: 36.7 % (ref 36.0–46.0)
HEMOGLOBIN: 11.7 g/dL — AB (ref 12.0–15.0)
MCH: 27.8 pg (ref 26.0–34.0)
MCHC: 31.9 g/dL (ref 30.0–36.0)
MCV: 87.2 fL (ref 78.0–100.0)
MPV: 8.8 fL (ref 8.6–12.4)
PLATELETS: 332 10*3/uL (ref 150–400)
RBC: 4.21 MIL/uL (ref 3.87–5.11)
RDW: 14.8 % (ref 11.5–15.5)
WBC: 12 10*3/uL — ABNORMAL HIGH (ref 4.0–10.5)

## 2015-06-21 MED ORDER — ELUXADOLINE 75 MG PO TABS: 1.0000 | ORAL_TABLET | Freq: Two times a day (BID) | ORAL | Status: DC

## 2015-06-21 NOTE — Progress Notes (Signed)
Referring Provider: Wardell Honour, MD Primary Care Physician:  Wardell Honour, MD  Primary GI: Dr. Gala Romney   Chief Complaint  Patient presents with  . Follow-up    HPI:   Felicia Pugh is a 69 y.o. female presenting today with a history of newly diagnosed Crohn's, insurance not approving Lialda. Prescribed Colazal 04/05/15. Seen May 17, 2015 with intermittent abdominal cramping and occasional loose stool, with question of dietary choices exacerbating. Here after keeping a food/stool diary. Asked to avoid lactose.   Used to weigh in mid 180s, now 175. Eats and comes right back out. Burping with drinking. No typical reflux. No dysphagia. Still with intermittent loose stools without warning, sneezing causes her to "mess up my clothes". Wears depends occasionally. No rectal bleeding. Occasionally abdominal cramping, sometimes not. Avoiding fried foods. Sometimes stool is oily.   Past Medical History  Diagnosis Date  . Diabetes mellitus without complication ([place])   . IBS (irritable bowel syndrome)   . Anemia     H/O myelodysplastic anemis? used to see Dr. Sonny Dandy, no longer seeing anyone (02/2015)  . Ulcer   . Diabetic neuropathy ([place])   . Hypertension   . Hyperlipidemia   . Thyroid nodule   . Cataract   . Stroke [facility]) 2011  . Complication of anesthesia     difficulty breathing after waking up from anesthesia  . Crohn's disease ([place])   . Syncope and collapse     Past Surgical History  Procedure Laterality Date  . Abdominal hysterectomy      partial  . Back surgery      x3 (Dr Loyola Mast, Dr Patrice Paradise)  . Colonoscopy  10/102006    RMR: normal  . Colonoscopy  09/06/2009    RMR: Suboptimal prep on the right side. Ulcers noted at cecum and in the descending colon. Terminal ileum could not be intubated. Ounces with chronic active colitis, question Crohn's  . Small bowel capsule endoscopy  06/10/2005    RMR: single erosion versus AVM, distal ileum  . Small bowel capsule endoscopy   prior to 05/2005    [place]: reported erosions/ulcerations per medical record. actual report unavailable.  . Esophagogastroduodenoscopy    . Breast surgery Right 2002    cyst removed  . Colonoscopy N/A 03/27/2015    Dr. Gala Romney: abnormal cecum and IC valve most consistent with IBD. TI intubated and appeared normal. Likely Crohn's disease. Patient denies NSAID use. No improvement with Lialda samples at time of initial diagnosis    Current Outpatient Prescriptions  Medication Sig Dispense Refill  . atorvastatin (LIPITOR) 40 MG tablet Take 1 tablet (40 mg total) by mouth daily. 90 tablet 1  . balsalazide (COLAZAL) 750 MG capsule Take 3 capsules (2,250 mg total) by mouth 3 (three) times daily. 270 capsule 3  . budesonide (ENTOCORT EC) 3 MG 24 hr capsule Take 2 capsules by mouth daily for 2 weeks then 1 capsule by mouth daily for 2 weeks then stop. 42 capsule 0  . Cinnamon 500 MG TABS Take 1 tablet by mouth daily.    . ferrous sulfate 325 (65 FE) MG tablet Take 1 tablet (325 mg total) by mouth daily with breakfast. 90 tablet 1  . glipiZIDE (GLUCOTROL) 10 MG tablet Take 1 tablet (10 mg total) by mouth daily. 90 tablet 1  . glucose blood test strip One Touch verio test strips. Test bid. E11.9 100 each 1  . hydrochlorothiazide (HYDRODIURIL) 12.5 MG tablet     . insulin  NPH Human (HUMULIN N,NOVOLIN N) 100 UNIT/ML injection 22 units in the morning and 15 units at night (Patient taking differently: 20 units in the morning and 15 units at night) 10 mL 11  . Insulin Syringes, Disposable, U-100 0.5 ML MISC Give insulin twice daily as directed 100 each 1  . lisinopril (PRINIVIL,ZESTRIL) 20 MG tablet Take 1 tablet (20 mg total) by mouth daily. 90 tablet 1  . meclizine (ANTIVERT) 25 MG tablet Take 1 tablet (25 mg total) by mouth 3 (three) times daily as needed for dizziness. 30 tablet 0   No current facility-administered medications for this visit.    Allergies as of 06/21/2015 - Review Complete 06/21/2015    Allergen Reaction Noted  . Metformin and related  05/11/2014  . Asa [aspirin]  11/04/2012  . Nsaids  11/04/2012  . Other Diarrhea 12/25/2014    Family History  Problem Relation Age of Onset  . Hypertension Mother   . Stroke Mother   . Diabetes Father     w/ BKA  . Hypertension Father   . Cancer Sister   . Diabetes Sister     stomach  . Diabetes Brother   . Diabetes Sister   . Cancer Sister     breast  . Hypertension Sister   . Colon cancer Neg Hx   . Inflammatory bowel disease Neg Hx     Social History   Social History  . Marital Status: Married    Spouse Name: N/A  . Number of Children: 5  . Years of Education: N/A   Social History Main Topics  . Smoking status: Never Smoker   . Smokeless tobacco: Never Used  . Alcohol Use: No  . Drug Use: No  . Sexual Activity: No   Other Topics Concern  . None   Social History Narrative    Review of Systems: As mentioned in HPI   Physical Exam: BP 134/84 mmHg  Pulse 83  Temp(Src) 97.5 F (36.4 C)  Ht 5' 2"  (1.575 m)  Wt 175 lb 3.2 oz (79.47 kg)  BMI 32.04 kg/m2 General:   Alert and oriented. No distress noted. Pleasant and cooperative.  Head:  Normocephalic and atraumatic. Eyes:  Conjuctiva clear without scleral icterus. Mouth:  Oral mucosa pink and moist. Good dentition. No lesions. Abdomen:  +BS, soft, non-tender and non-distended. No rebound or guarding. No HSM or masses noted. Msk:  Symmetrical without gross deformities. Normal posture. Extremities:  Without edema. Neurologic:  Alert and  oriented x4;  grossly normal neurologically. Psych:  Alert and cooperative. Normal mood and affect.

## 2015-06-21 NOTE — Patient Instructions (Signed)
Please complete the blood work today. We may need to do further imaging.   Please complete the stool sample and return to the lab.   In the meantime, start taking Viberzi 1 tablet with food, twice a day. If you don't have a BM for more than 3 days, call me and stop the medication.   Further recommendations after blood work and stool test completed.

## 2015-06-22 ENCOUNTER — Encounter: Payer: Self-pay | Admitting: *Deleted

## 2015-06-22 ENCOUNTER — Other Ambulatory Visit: Payer: Self-pay | Admitting: Gastroenterology

## 2015-06-22 ENCOUNTER — Telehealth: Payer: Self-pay | Admitting: Gastroenterology

## 2015-06-22 ENCOUNTER — Ambulatory Visit (INDEPENDENT_AMBULATORY_CARE_PROVIDER_SITE_OTHER): Payer: Medicare HMO

## 2015-06-22 ENCOUNTER — Other Ambulatory Visit: Payer: Self-pay

## 2015-06-22 DIAGNOSIS — Z23 Encounter for immunization: Secondary | ICD-10-CM

## 2015-06-22 DIAGNOSIS — K5090 Crohn's disease, unspecified, without complications: Secondary | ICD-10-CM

## 2015-06-22 DIAGNOSIS — D649 Anemia, unspecified: Secondary | ICD-10-CM | POA: Diagnosis not present

## 2015-06-22 DIAGNOSIS — K50918 Crohn's disease, unspecified, with other complication: Secondary | ICD-10-CM

## 2015-06-22 DIAGNOSIS — R197 Diarrhea, unspecified: Secondary | ICD-10-CM | POA: Diagnosis not present

## 2015-06-22 LAB — COMPLETE METABOLIC PANEL WITH GFR
ALT: 11 U/L (ref 6–29)
AST: 18 U/L (ref 10–35)
Albumin: 4.1 g/dL (ref 3.6–5.1)
Alkaline Phosphatase: 123 U/L (ref 33–130)
BILIRUBIN TOTAL: 0.6 mg/dL (ref 0.2–1.2)
BUN: 11 mg/dL (ref 7–25)
CHLORIDE: 101 mmol/L (ref 98–110)
CO2: 33 mmol/L — AB (ref 20–31)
CREATININE: 0.67 mg/dL (ref 0.50–0.99)
Calcium: 9.7 mg/dL (ref 8.6–10.4)
GFR, Est Non African American: >89 mL/min (ref >=60)
GLUCOSE: 44 mg/dL — AB (ref 65–99)
Potassium: 3.5 mmol/L (ref 3.5–5.3)
Sodium: 145 mmol/L (ref 135–146)
TOTAL PROTEIN: 7.2 g/dL (ref 6.1–8.1)

## 2015-06-22 LAB — TSH: TSH: 1.316 u[IU]/mL (ref 0.350–4.500)

## 2015-06-22 LAB — IRON AND TIBC
%SAT: 18 % (ref 11–50)
IRON: 56 ug/dL (ref 45–160)
TIBC: 310 ug/dL (ref 250–450)
UIBC: 254 ug/dL (ref 125–400)

## 2015-06-22 LAB — IGA: IgA: 258 mg/dL (ref 69–380)

## 2015-06-22 LAB — TISSUE TRANSGLUTAMINASE, IGA: Tissue Transglutaminase Ab, IgA: 1 U/mL (ref <4)

## 2015-06-22 LAB — C-REACTIVE PROTEIN

## 2015-06-22 LAB — FERRITIN: FERRITIN: 256 ng/mL (ref 10–291)

## 2015-06-22 LAB — SEDIMENTATION RATE: SED RATE: 6 mm/h (ref 0–30)

## 2015-06-22 NOTE — Telephone Encounter (Signed)
Tried to call pt- NA- LMOM 

## 2015-06-22 NOTE — Telephone Encounter (Signed)
Insurance pending

## 2015-06-22 NOTE — Telephone Encounter (Signed)
Called [facility] lab and added sed rate. calprotectin can be ran with the pancreatic elastace. Orders put in for that. Per AS, wait on [facility] labs for now.

## 2015-06-22 NOTE — Telephone Encounter (Signed)
Patient just had labs yesterday. Can we add a sed rate if possible? Also, I would like for her to obtain another stool test, a fecal calprotectin.   We need to also set up for a CT enterography to further assess extent of disease. How can we order the IBD panel from [facility] to assess if truly Crohn's going on?

## 2015-06-22 NOTE — Telephone Encounter (Signed)
Pt is aware. She turned in stool sample this afternoon. I will call and add on new stool test.  She said it was ok to set up CT. Please schedule. Informed her that we may need further studies in the future.

## 2015-06-23 ENCOUNTER — Telehealth: Payer: Self-pay | Admitting: Family Medicine

## 2015-06-23 NOTE — Telephone Encounter (Signed)
HCTZ 12.5 mg was added on 9/6. She only has 10 days left so she now needs the prescription for the combination medication that was mentioned at that visit sent into pharmacy

## 2015-06-23 NOTE — Telephone Encounter (Signed)
Spoke with Colbert Ewing and Caryl Pina at [facility]. Order of calprotectin has been added on to the sample they have.

## 2015-06-26 NOTE — Telephone Encounter (Signed)
Insurance still pending

## 2015-06-27 ENCOUNTER — Ambulatory Visit: Payer: Medicare HMO | Admitting: Gastroenterology

## 2015-06-27 ENCOUNTER — Telehealth: Payer: Self-pay

## 2015-06-27 MED ORDER — ELUXADOLINE 75 MG PO TABS: 1.0000 | ORAL_TABLET | Freq: Two times a day (BID) | ORAL | Status: DC

## 2015-06-27 NOTE — Telephone Encounter (Signed)
YAY!  I printed out the prescription. Needs to be faxed. Thanks! Other lab results are pending.

## 2015-06-27 NOTE — Assessment & Plan Note (Signed)
Concerning. Extensive labs ordered. Checking fecal elastase as well.

## 2015-06-27 NOTE — Telephone Encounter (Signed)
I will fax the Rx over for her

## 2015-06-27 NOTE — Telephone Encounter (Signed)
Pt s calling to let us know that the Viberzi 75 mg is working and she would like to have some called into [facility] in [place].

## 2015-06-27 NOTE — Telephone Encounter (Signed)
Needs Rx for lisinopril HCTZ; 20/12.5

## 2015-06-27 NOTE — Assessment & Plan Note (Signed)
69 year old female with history of chronic diarrhea, question of IBS, recent diagnosis of likely Crohn's disease with persistent intermittent diarrhea despite dietary modification. Will check CRP, Sed rate, CBC, celiac serologies, TSH, fecal elastase. Proceed with CTE to assess extent of disease. Trial Viberzi 75 mg samples in case of possible IBS overlay. Weight loss concerning in this scenario. May not be ideally controlled on Colazal. Has already finished entocort therapy. I'm not convinced her symptoms are all related to Crohn's disease. Further recommendations to follow.

## 2015-06-27 NOTE — Assessment & Plan Note (Signed)
Likely chronic disease. Check iron, ferritin, TIBC now. Celiac serologies ordered.

## 2015-06-28 ENCOUNTER — Other Ambulatory Visit: Payer: Self-pay | Admitting: *Deleted

## 2015-06-28 MED ORDER — LISINOPRIL-HYDROCHLOROTHIAZIDE 20-12.5 MG PO TABS: 1.0000 | ORAL_TABLET | Freq: Every day | ORAL | Status: DC

## 2015-06-28 NOTE — Telephone Encounter (Signed)
lmovm that combination med was sent into pharmacy per Dr. Sabra Heck

## 2015-06-28 NOTE — Progress Notes (Signed)
CC'ED TO PCP 

## 2015-06-28 NOTE — Telephone Encounter (Signed)
Per pt insurance they are requesting a peer-to-peer to be done as soon as they can.   Please call [phone] Option 4, option 3, option 2   Case # [id number]

## 2015-06-29 ENCOUNTER — Telehealth: Payer: Self-pay | Admitting: Internal Medicine

## 2015-06-29 NOTE — Telephone Encounter (Signed)
Routing to [facility]

## 2015-06-29 NOTE — Telephone Encounter (Signed)
Pt called to speak with [facility]. She said that her ziberzi 75 mg prescription was too expensive ($200+) and could she get samples or have something else less expensive called into [facility] in [place]. [phone]

## 2015-06-30 LAB — CALPROTECTIN

## 2015-07-03 ENCOUNTER — Telehealth: Payer: Self-pay | Admitting: Gastroenterology

## 2015-07-03 NOTE — Telephone Encounter (Signed)
[phone]  PATIENT CALLED INQUIRING ON THE VIBERZI SAMPLES

## 2015-07-03 NOTE — Telephone Encounter (Signed)
#  4 boxes of viberzi is at the front desk and the pt is aware.

## 2015-07-03 NOTE — Telephone Encounter (Signed)
PA was already done and approved by her insurance. She has Medicare and cannot use a copay card.

## 2015-07-03 NOTE — Telephone Encounter (Signed)
Does it need a prior authorization? Any co-pay card we could use?

## 2015-07-03 NOTE — Progress Notes (Signed)
Quick Note:  Late entry on labs: low glucose but contact with patient was made shortly thereafter and doing well. Doubt real finding. CRP normal, sed rate normal. Negative celiac serologies. Ferritin normal. TSH normal. Likely anemia of chronic disease. Mild elevation of white count at 12, non-specific. Was pancreatic elastase not completed? ______

## 2015-07-05 LAB — PANCREATIC ELASTASE, FECAL: Pancreatic Elastase-1, Stool: >500 mcg/g

## 2015-07-05 NOTE — Progress Notes (Signed)
Quick Note:  No evidence of pancreatic insufficiency. ______

## 2015-07-05 NOTE — Telephone Encounter (Signed)
Samples given to the pt.

## 2015-07-05 NOTE — Telephone Encounter (Signed)
I HATE that this is too expensive. That's even with approval by insurance? We could try Bentyl 10 mg with meals, but I doubt it will be as effective. Let me see what we can do by talking to the rep, and let's give her samples in the meantime to last through next week until we can sort this out.

## 2015-07-15 ENCOUNTER — Other Ambulatory Visit: Payer: Self-pay | Admitting: Family Medicine

## 2015-07-17 ENCOUNTER — Telehealth: Payer: Self-pay | Admitting: Family Medicine

## 2015-07-17 NOTE — Telephone Encounter (Signed)
Rx sent to pharmacy   

## 2015-07-24 ENCOUNTER — Telehealth: Payer: Self-pay | Admitting: General Practice

## 2015-07-24 NOTE — Telephone Encounter (Signed)
Patient called in with questions in regards to the sample of Viberzi 75 mg.  She stated the medicine is helping with her diarrhea and wanted to know if we could call a Rx in.  After reviewing the patient's chart it appears that [facility] sent in a Rx for Viberzi back in 06/2015.  I made the patient aware of this and she said she will call her pharmacy and get back to [place] if they don't have it.

## 2015-07-24 NOTE — Telephone Encounter (Signed)
Pt called back, she said the pharmacy has her rx, but it is $245.30 and she cannot afford that. She wants to know if [facility] can send in something else for her.

## 2015-07-25 MED ORDER — DICYCLOMINE HCL 10 MG PO CAPS: 10.0000 mg | ORAL_CAPSULE | Freq: Three times a day (TID) | ORAL | Status: DC

## 2015-07-25 NOTE — Telephone Encounter (Signed)
I'm so sorry. Can we resubmit for a CT enterography? Diagnosis: Crohn's disease, no prior abdominal imaging, needs assessment due to weight loss.

## 2015-07-25 NOTE — Telephone Encounter (Signed)
Tried to call pt- NA and no voicemail.

## 2015-07-25 NOTE — Addendum Note (Signed)
Addended by: Orvil Feil on: 07/25/2015 11:44 AM   Modules accepted: Orders

## 2015-07-25 NOTE — Telephone Encounter (Signed)
No her case is not open anymore. We will have to do a new case. Please advise

## 2015-07-25 NOTE — Telephone Encounter (Signed)
Unfortunately, she can't use a copay card.   We will have to do a different medication. I have sent in Bentyl 10 mg to take with meals and at bedtime as needed. Stop VIberzi. Do not take with Bentyl.   I need to follow-up on CT enterography. I believe it needed a prior authorization? Is it still open? (Routing this to [facility] clinical as well as Engineer, civil (consulting))

## 2015-07-27 NOTE — Telephone Encounter (Signed)
Pt is aware. Ok to set up ct whenever it is approved.

## 2015-08-01 NOTE — Telephone Encounter (Signed)
I called to check on PA for CT scan it is still in reveiw

## 2015-08-03 NOTE — Telephone Encounter (Signed)
I have done an appeal for her insurance company. It will take up to 72 hrs

## 2015-08-08 NOTE — Telephone Encounter (Signed)
CT SCAN APPROVED  PA # [phone]

## 2015-08-08 NOTE — Telephone Encounter (Signed)
Pt is set up on 08/10/15 @ 8:15. She is aware

## 2015-08-10 ENCOUNTER — Ambulatory Visit (HOSPITAL_COMMUNITY): Payer: Medicare HMO

## 2015-08-11 ENCOUNTER — Ambulatory Visit (HOSPITAL_COMMUNITY)
Admission: RE | Admit: 2015-08-11 | Discharge: 2015-08-11 | Disposition: A | Discharge status: Home / Self Care | Payer: Medicare HMO | Source: Ambulatory Visit | Attending: Gastroenterology | Admitting: Gastroenterology

## 2015-08-11 DIAGNOSIS — I2510 Atherosclerotic heart disease of native coronary artery without angina pectoris: Secondary | ICD-10-CM | POA: Insufficient documentation

## 2015-08-11 DIAGNOSIS — K760 Fatty (change of) liver, not elsewhere classified: Secondary | ICD-10-CM | POA: Insufficient documentation

## 2015-08-11 DIAGNOSIS — K50918 Crohn's disease, unspecified, with other complication: Secondary | ICD-10-CM

## 2015-08-11 DIAGNOSIS — M461 Sacroiliitis, not elsewhere classified: Secondary | ICD-10-CM | POA: Insufficient documentation

## 2015-08-11 DIAGNOSIS — K5090 Crohn's disease, unspecified, without complications: Secondary | ICD-10-CM | POA: Insufficient documentation

## 2015-08-11 DIAGNOSIS — I517 Cardiomegaly: Secondary | ICD-10-CM | POA: Insufficient documentation

## 2015-08-11 DIAGNOSIS — R634 Abnormal weight loss: Secondary | ICD-10-CM | POA: Diagnosis not present

## 2015-08-11 LAB — POCT I-STAT CREATININE: Creatinine, Ser: 0.7 mg/dL (ref 0.44–1.00)

## 2015-08-11 MED ORDER — IOHEXOL 300 MG/ML  SOLN
125.0000 mL | Freq: Once | INTRAMUSCULAR | Status: AC | PRN
  Administered 2015-08-11: 150 mL via INTRAVENOUS

## 2015-08-21 NOTE — Progress Notes (Signed)
Quick Note:  CTE reviewed. She has an area of the TI compatible with known Crohn's disease. There is also an abnormal area in the left inguinal region. We need to do an ultrasound of left inguinal groin to see if this is an enlarged lymph node. Finally, let's have her come back and see me. I want to see how she is doing. ______

## 2015-08-22 ENCOUNTER — Ambulatory Visit: Payer: Medicare HMO | Admitting: Neurology

## 2015-08-23 ENCOUNTER — Other Ambulatory Visit: Payer: Self-pay | Admitting: *Deleted

## 2015-08-23 ENCOUNTER — Encounter: Payer: Self-pay | Admitting: Family Medicine

## 2015-08-23 ENCOUNTER — Ambulatory Visit (INDEPENDENT_AMBULATORY_CARE_PROVIDER_SITE_OTHER): Payer: Medicare HMO | Admitting: Family Medicine

## 2015-08-23 DIAGNOSIS — I10 Essential (primary) hypertension: Secondary | ICD-10-CM | POA: Diagnosis not present

## 2015-08-23 DIAGNOSIS — E785 Hyperlipidemia, unspecified: Secondary | ICD-10-CM

## 2015-08-23 DIAGNOSIS — E119 Type 2 diabetes mellitus without complications: Secondary | ICD-10-CM

## 2015-08-23 LAB — POCT GLYCOSYLATED HEMOGLOBIN (HGB A1C): Hemoglobin A1C: 8.3

## 2015-08-23 NOTE — Progress Notes (Signed)
Subjective:    Patient ID: Felicia Pugh, female    DOB: 05/07/1946, 70 y.o.   MRN: [id number]  HPI 70 year old female who is followed here for her diabetes, lipids, and blood pressure. She also has Crohn's and irritable bowel and has been recently started on Viberzi and that seems to be effective. There were some issues with insurance coverage but now she is receiving it. Regarding her diabetes she is on NPH insulin as well as Prinzide and sugars have been well controlled at home when checked. She denies other symptoms such as burning or pain in her legs and feet. She has had more appetite on the new medicine but her weight is about the same as it was 2 months ago.  Patient Active Problem List   Diagnosis Date Noted  . Loss of weight 06/21/2015  . Vertigo 05/16/2015  . HLD (hyperlipidemia) 05/16/2015  . Type 2 diabetes mellitus without complication, without long-term current use of insulin ([place]) 05/16/2015  . Crohn's disease with complication ([place]) 35/45/6256  . Crohn's disease ([place]) 04/12/2015  . Mucosal abnormality of colon   . Diverticulosis of colon without hemorrhage   . Anemia 02/28/2015  . History of stroke 07/08/2014  . Osteopenia 05/11/2014  . Diabetes mellitus without complication ([place]) 38/93/7342  . Hyperlipidemia with target LDL less than 100 06/01/2008  . Anemia, chronic disease 06/01/2008  . Essential hypertension 06/01/2008  . HEMORRHOIDS, INTERNAL 06/01/2008  . IRRITABLE BOWEL SYNDROME 06/01/2008  . HEMOCCULT POSITIVE STOOL 06/01/2008  . 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 WEIGHT GAIN 06/01/2008  . FECAL INCONTINENCE 06/01/2008  . DIARRHEA 06/01/2008   Outpatient Encounter Prescriptions as of 08/23/2015  Medication Sig  . atorvastatin (LIPITOR) 40 MG tablet Take 1 tablet (40 mg total) by mouth daily.  . balsalazide (COLAZAL) 750 MG capsule Take 3 capsules (2,250 mg total) by mouth 3 (three) times daily.  . Cinnamon 500 MG TABS Take 1 tablet by mouth daily.  [name] dicyclomine  (BENTYL) 10 MG capsule Take 1 capsule (10 mg total) by mouth 4 (four) times daily -  before meals and at bedtime.  [name] glipiZIDE (GLUCOTROL) 10 MG tablet Take 1 tablet (10 mg total) by mouth daily.  [name] glucose blood test strip One Touch verio test strips. Test bid. E11.9  . hydrochlorothiazide (HYDRODIURIL) 12.5 MG tablet   . hydrochlorothiazide (HYDRODIURIL) 12.5 MG tablet TAKE ONE TABLET BY MOUTH ONCE DAILY  . insulin NPH Human (HUMULIN N,NOVOLIN N) 100 UNIT/ML injection 22 units in the morning and 15 units at night (Patient taking differently: 20 units in the morning and 15 units at night)  . Insulin Syringes, Disposable, U-100 0.5 ML MISC Give insulin twice daily as directed  . lisinopril (PRINIVIL,ZESTRIL) 20 MG tablet Take 1 tablet (20 mg total) by mouth daily.  [name] lisinopril-hydrochlorothiazide (ZESTORETIC) 20-12.5 MG tablet Take 1 tablet by mouth daily.  . meclizine (ANTIVERT) 25 MG tablet Take 1 tablet (25 mg total) by mouth 3 (three) times daily as needed for dizziness.  . [DISCONTINUED] budesonide (ENTOCORT EC) 3 MG 24 hr capsule Take 2 capsules by mouth daily for 2 weeks then 1 capsule by mouth daily for 2 weeks then stop.  . [DISCONTINUED] Eluxadoline (VIBERZI) 75 MG TABS Take 1 tablet by mouth 2 (two) times daily with a meal.  . [DISCONTINUED] ferrous sulfate 325 (65 FE) MG tablet Take 1 tablet (325 mg total) by mouth daily with breakfast.   No facility-administered encounter medications on file as of 08/23/2015.  Review of Systems  Constitutional: Positive for appetite change.  HENT: Negative.   Respiratory: Negative.   Cardiovascular: Negative.   Genitourinary: Negative.   Neurological: Negative.        Objective:   Physical Exam  Constitutional: She is oriented to person, place, and time. She appears well-developed and well-nourished.  Cardiovascular: Normal rate, regular rhythm, normal heart sounds and intact distal pulses.   Pulmonary/Chest: Effort normal and breath  sounds normal.  Neurological: She is alert and oriented to person, place, and time.  Psychiatric: She has a normal mood and affect. Her behavior is normal.          Assessment & Plan:  1. Diabetes mellitus without complication (HCC) V4F was 7.1. Would expect similar results today - POCT glycosylated hemoglobin (Hb A1C) - CMP14+EGFR - Lipid panel  2. Hyperlipidemia with target LDL less than 100 We last checked lipids 6 months ago. LDL was 69 and HDL 75, a good ratio - POCT glycosylated hemoglobin (Hb A1C) - CMP14+EGFR - Lipid panel  3. Essential hypertension Blood pressures of been well controlled on lisinopril and hydrochlorothiazide, with no side effects. - POCT glycosylated hemoglobin (Hb A1C) - CMP14+EGFR - Lipid panel Wardell Honour MD

## 2015-08-24 LAB — CMP14+EGFR
ALT: 15 IU/L (ref 0–32)
AST: 21 IU/L (ref 0–40)
Albumin/Globulin Ratio: 1.7 (ref 1.1–2.5)
Albumin: 4.2 g/dL (ref 3.6–4.8)
Alkaline Phosphatase: 121 IU/L — ABNORMAL HIGH (ref 39–117)
BUN/Creatinine Ratio: 33 — ABNORMAL HIGH (ref 11–26)
BUN: 23 mg/dL (ref 8–27)
Bilirubin Total: 0.3 mg/dL (ref 0.0–1.2)
CHLORIDE: 103 mmol/L (ref 96–106)
CO2: 25 mmol/L (ref 18–29)
CREATININE: 0.7 mg/dL (ref 0.57–1.00)
Calcium: 9.4 mg/dL (ref 8.7–10.3)
GFR, EST AFRICAN AMERICAN: 102 mL/min/{1.73_m2} (ref >59)
GFR, EST NON AFRICAN AMERICAN: 89 mL/min/{1.73_m2} (ref >59)
Globulin, Total: 2.5 g/dL (ref 1.5–4.5)
Glucose: 113 mg/dL — ABNORMAL HIGH (ref 65–99)
Potassium: 4.1 mmol/L (ref 3.5–5.2)
Sodium: 144 mmol/L (ref 134–144)
TOTAL PROTEIN: 6.7 g/dL (ref 6.0–8.5)

## 2015-08-24 LAB — LIPID PANEL
CHOL/HDL RATIO: 2 ratio (ref 0.0–4.4)
Cholesterol, Total: 184 mg/dL (ref 100–199)
HDL: 92 mg/dL (ref >39)
LDL CALC: 83 mg/dL (ref 0–99)
TRIGLYCERIDES: 47 mg/dL (ref 0–149)
VLDL Cholesterol Cal: 9 mg/dL (ref 5–40)

## 2015-08-29 ENCOUNTER — Telehealth: Payer: Self-pay | Admitting: *Deleted

## 2015-08-29 NOTE — Telephone Encounter (Signed)
I called pt to reschedule her appt.  She stated that she is doing well and does not want to reschedule appt at this time.   She will call back as needed.

## 2015-08-30 ENCOUNTER — Other Ambulatory Visit: Payer: Self-pay | Admitting: Gastroenterology

## 2015-08-30 DIAGNOSIS — R599 Enlarged lymph nodes, unspecified: Secondary | ICD-10-CM

## 2015-08-30 NOTE — Progress Notes (Signed)
Patient is made aware of her appts

## 2015-09-01 ENCOUNTER — Ambulatory Visit (HOSPITAL_COMMUNITY)
Admission: RE | Admit: 2015-09-01 | Discharge: 2015-09-01 | Disposition: A | Discharge status: Home / Self Care | Payer: Medicare HMO | Source: Ambulatory Visit | Attending: Gastroenterology | Admitting: Gastroenterology

## 2015-09-01 DIAGNOSIS — I862 Pelvic varices: Secondary | ICD-10-CM | POA: Insufficient documentation

## 2015-09-01 DIAGNOSIS — R599 Enlarged lymph nodes, unspecified: Secondary | ICD-10-CM | POA: Diagnosis not present

## 2015-09-01 DIAGNOSIS — R591 Generalized enlarged lymph nodes: Secondary | ICD-10-CM | POA: Insufficient documentation

## 2015-09-04 NOTE — Progress Notes (Signed)
Quick Note:  Good news! No evidence of lymphadenopathy. Looks like it was a collection of large veins in left groin. How is she doing? ______

## 2015-09-13 ENCOUNTER — Other Ambulatory Visit: Payer: Self-pay | Admitting: Gastroenterology

## 2015-09-14 ENCOUNTER — Telehealth: Payer: Self-pay | Admitting: Internal Medicine

## 2015-09-14 NOTE — Telephone Encounter (Signed)
Called pt- medication is balsalazide 73m.  We changed her to this last year d/t insurance and she said she has done very well with it. Last year it was a tier 2, now it is a tier 3. Pulled [employer] medicare formulary and all mesalamine medications are either tier 3 or tier 4. None of them are tier 2. Pt will have to pay $100 a month and she will be in the "doughnut hole" in 6 months. Talked to EG- we can try to do a tier exception with her insurance. If this is denied, we will need to try pt assistance. I am sending the pt the information about the medicare LIS (low income subsidy ), she will try this while we are trying to get a tier exception, I am also sending her the pt assistance forms so she will have them in case we need them. Pt is going to pick up the medication this month at the pharmacy because she doesn't want to get sick again. All forms are in the mail to her. Working on tier exception.

## 2015-09-14 NOTE — Telephone Encounter (Signed)
[phone]  PLEASE CALL PATIENT, HER MEDICATION HAS GONE UP TO OVER 100$ AND SHE NEEDS ANOTHER OPTION.

## 2015-09-19 DIAGNOSIS — R69 Illness, unspecified: Secondary | ICD-10-CM | POA: Diagnosis not present

## 2015-09-20 ENCOUNTER — Ambulatory Visit (INDEPENDENT_AMBULATORY_CARE_PROVIDER_SITE_OTHER): Payer: Medicare HMO | Admitting: Gastroenterology

## 2015-09-20 ENCOUNTER — Encounter: Payer: Self-pay | Admitting: Gastroenterology

## 2015-09-20 VITALS — BP 123/73 | HR 90 | Temp 97.0°F | Ht 62.0 in | Wt 180.6 lb

## 2015-09-20 DIAGNOSIS — K50918 Crohn's disease, unspecified, with other complication: Secondary | ICD-10-CM

## 2015-09-20 NOTE — Progress Notes (Signed)
cc'ed to pcp °

## 2015-09-20 NOTE — Patient Instructions (Signed)
Continue to take dicyclomine (Bentyl) as needed. Please let me know if you fall in the donut hole.  I will review the liver cyst with our radiologist. I feel this may be something that we can leave alone, but we will make sure.  I will see you in 6 months! Please let me know if you have any problems in the meantime!

## 2015-09-20 NOTE — Progress Notes (Signed)
Referring Provider: Wardell Honour, MD Primary Care Physician:  Wardell Honour, MD  Primary GI: Dr. Gala Romney   Chief Complaint  Patient presents with  . Follow-up    HPI:   Felicia Pugh is a 70 y.o. female presenting today with a history of newly diagnosed Crohn's, insurance not approving Lialda. Prescribed Colazal 04/05/15. Seen May 17, 2015 with intermittent abdominal cramping and occasional loose stool, with question of dietary choices exacerbating. CRP, sed rate, celiac serologies, TSH, fecal elastase all normal at that time. Has anemia of chronic disease. Was prescribed Viberzi samples in case of IBS overlay, which improved her symptoms. However, insurance did not cover this. CTE was ordered and showed an abnormal area in the left inguinal groin, which turned out to be just prominent varices on [place]. Also noted 32m hypodense lesion in the dome of right hepatic lobe, statistically high to be benign. Mild fatty liver.   Weight has improved from prior visits. On Bentyl. She has days that she may go 4-5 times but not every day. Most of the time when she has to go, can feel it just a little bit and knows she has to go. Prior to this would have incontinence. Feels much more improved. Balsalazide was changed to a tier 3. Does not meet the Medicare low income subsidy. Her copay is 89$ a month. May run into the donut hole in the future. No rectal bleeding. Hungry all the time. No abdominal pain.   Past Medical History  Diagnosis Date  . Diabetes mellitus without complication ([place]   . IBS (irritable bowel syndrome)   . Anemia     H/O myelodysplastic anemis? used to see Dr. KSonny Dandy no longer seeing anyone (02/2015)  . Ulcer   . Diabetic neuropathy ([place]   . Hypertension   . Hyperlipidemia   . Thyroid nodule   . Cataract   . Stroke ([facility] 2011  . Complication of anesthesia     difficulty breathing after waking up from anesthesia  . Crohn's disease ([place]   . Syncope and collapse     Past  Surgical History  Procedure Laterality Date  . Abdominal hysterectomy      partial  . Back surgery      x3 (Dr KLoyola Mast Dr CPatrice Paradise  . Colonoscopy  10/102006    RMR: normal  . Colonoscopy  09/06/2009    RMR: Suboptimal prep on the right side. Ulcers noted at cecum and in the descending colon. Terminal ileum could not be intubated. Ounces with chronic active colitis, question Crohn's  . Small bowel capsule endoscopy  06/10/2005    RMR: single erosion versus AVM, distal ileum  . Small bowel capsule endoscopy  prior to 05/2005    [place]: reported erosions/ulcerations per medical record. actual report unavailable.  . Esophagogastroduodenoscopy    . Breast surgery Right 2002    cyst removed  . Colonoscopy N/A 03/27/2015    Dr. RGala Romney abnormal cecum and IC valve most consistent with IBD. TI intubated and appeared normal. Likely Crohn's disease. Patient denies NSAID use. No improvement with Lialda samples at time of initial diagnosis    Current Outpatient Prescriptions  Medication Sig Dispense Refill  . atorvastatin (LIPITOR) 40 MG tablet Take 1 tablet (40 mg total) by mouth daily. 90 tablet 1  . balsalazide (COLAZAL) 750 MG capsule TAKE THREE CAPSULES BY MOUTH THREE TIMES DAILY 270 capsule 3  . Cinnamon 500 MG TABS Take 1 tablet by mouth daily.    .[name]  dicyclomine (BENTYL) 10 MG capsule Take 1 capsule (10 mg total) by mouth 4 (four) times daily -  before meals and at bedtime. 120 capsule 3  . glipiZIDE (GLUCOTROL) 10 MG tablet Take 1 tablet (10 mg total) by mouth daily. 90 tablet 1  . glucose blood test strip One Touch verio test strips. Test bid. E11.9 100 each 1  . hydrochlorothiazide (HYDRODIURIL) 12.5 MG tablet     . hydrochlorothiazide (HYDRODIURIL) 12.5 MG tablet TAKE ONE TABLET BY MOUTH ONCE DAILY 30 tablet 0  . insulin NPH Human (HUMULIN N,NOVOLIN N) 100 UNIT/ML injection 22 units in the morning and 15 units at night (Patient taking differently: 20 units in the morning and 15 units at night)  10 mL 11  . Insulin Syringes, Disposable, U-100 0.5 ML MISC Give insulin twice daily as directed 100 each 1  . lisinopril (PRINIVIL,ZESTRIL) 20 MG tablet Take 1 tablet (20 mg total) by mouth daily. 90 tablet 1  . lisinopril-hydrochlorothiazide (ZESTORETIC) 20-12.5 MG tablet Take 1 tablet by mouth daily. 90 tablet 1  . meclizine (ANTIVERT) 25 MG tablet Take 1 tablet (25 mg total) by mouth 3 (three) times daily as needed for dizziness. 30 tablet 0   No current facility-administered medications for this visit.    Allergies as of 09/20/2015 - Review Complete 09/20/2015  Allergen Reaction Noted  . Metformin and related  05/11/2014  . Asa [aspirin]  11/04/2012  . Nsaids  11/04/2012  . Other Diarrhea 12/25/2014    Family History  Problem Relation Age of Onset  . Hypertension Mother   . Stroke Mother   . Diabetes Father     w/ BKA  . Hypertension Father   . Cancer Sister   . Diabetes Sister     stomach  . Diabetes Brother   . Diabetes Sister   . Cancer Sister     breast  . Hypertension Sister   . Colon cancer Neg Hx   . Inflammatory bowel disease Neg Hx     Social History   Social History  . Marital Status: Married    Spouse Name: N/A  . Number of Children: 5  . Years of Education: N/A   Social History Main Topics  . Smoking status: Never Smoker   . Smokeless tobacco: Never Used  . Alcohol Use: No  . Drug Use: No  . Sexual Activity: No   Other Topics Concern  . None   Social History Narrative    Review of Systems: As mentioned in HPI.   Physical Exam: BP 123/73 mmHg  Pulse 90  Temp(Src) 97 F (36.1 C) (Oral)  Ht 5' 2"  (1.575 m)  Wt 180 lb 9.6 oz (81.92 kg)  BMI 33.02 kg/m2 General:   Alert and oriented. No distress noted. Pleasant and cooperative.  Head:  Normocephalic and atraumatic. Eyes:  Conjuctiva clear without scleral icterus. Abdomen:  +BS, soft, non-tender and non-distended. No rebound or guarding. No HSM or masses noted. Msk:  Symmetrical  without gross deformities. Normal posture. Extremities:  Without edema. Neurologic:  Alert and  oriented x4;  grossly normal neurologically. Psych:  Alert and cooperative. Normal mood and affect.  Lab Results  Component Value Date   WBC 12.0* 06/21/2015   HGB 11.7* 06/21/2015   HCT 36.7 06/21/2015   MCV 87.2 06/21/2015   PLT 332 06/21/2015   Lab Results  Component Value Date   IRON 56 06/21/2015   TIBC 310 06/21/2015   FERRITIN 256 06/21/2015

## 2015-09-20 NOTE — Assessment & Plan Note (Signed)
70 year old with colitis, on Colazal as insurance would not approve Lialda, doing well with this regimen. She has what is likely an IBS overlay and has done well historically with Viberzi; however, this was unable to be covered by insurance. She was changed to Bentyl, which is doing well. Dietary modification has helped tremendously with symptoms. CRP, sed rate, celiac serologies, and fecal elastase all normal. CTE completed with concerning area in left inguinal groin, which in follow-up ultrasound revealed varicosities. She has gained weight since last visit. I am quite pleased with how well she is doing. As of note, a 43m hypodense lesion in the dome of right hepatic lobe noted, likely benign. Will discuss this with radiology. Return in 6 months.

## 2015-10-12 ENCOUNTER — Ambulatory Visit: Payer: Medicare HMO | Admitting: Gastroenterology

## 2015-11-29 ENCOUNTER — Encounter: Payer: Self-pay | Admitting: Family Medicine

## 2015-11-29 ENCOUNTER — Ambulatory Visit (INDEPENDENT_AMBULATORY_CARE_PROVIDER_SITE_OTHER): Payer: Medicare HMO | Admitting: Family Medicine

## 2015-11-29 VITALS — BP 122/70 | HR 76 | Temp 97.2°F | Ht 62.0 in | Wt 183.2 lb

## 2015-11-29 DIAGNOSIS — E119 Type 2 diabetes mellitus without complications: Secondary | ICD-10-CM | POA: Diagnosis not present

## 2015-11-29 LAB — BAYER DCA HB A1C WAIVED: HB A1C: 8.6 % — AB (ref <7.0)

## 2015-11-29 NOTE — Progress Notes (Signed)
Subjective:    Patient ID: Felicia Pugh, female    DOB: June 17, 1946, 70 y.o.   MRN: [id number]  HPI 70 year old female with diabetes. Also has Crohn's disease. Complains of fatigue today. Sugars at home have been varying. Last A1c 3 months ago was 8.3 currently uses NPH 22 and 15 in the a.m. and p.m. as well as glipizide 10 mg.  Patient Active Problem List   Diagnosis Date Noted  . Loss of weight 06/21/2015  . Vertigo 05/16/2015  . HLD (hyperlipidemia) 05/16/2015  . Type 2 diabetes mellitus without complication, without long-term current use of insulin ([place]) 05/16/2015  . Crohn's disease with complication ([place]) 51/88/4166  . Crohn's disease ([place]) 04/12/2015  . Mucosal abnormality of colon   . Diverticulosis of colon without hemorrhage   . Anemia 02/28/2015  . History of stroke 07/08/2014  . Osteopenia 05/11/2014  . Diabetes mellitus without complication ([place]) 02/09/1600  . Hyperlipidemia with target LDL less than 100 06/01/2008  . Anemia, chronic disease 06/01/2008  . Essential hypertension 06/01/2008  . HEMORRHOIDS, INTERNAL 06/01/2008  . IRRITABLE BOWEL SYNDROME 06/01/2008  . HEMOCCULT POSITIVE STOOL 06/01/2008  . 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 WEIGHT GAIN 06/01/2008  . FECAL INCONTINENCE 06/01/2008  . DIARRHEA 06/01/2008   Outpatient Encounter Prescriptions as of 11/29/2015  Medication Sig  . atorvastatin (LIPITOR) 40 MG tablet Take 1 tablet (40 mg total) by mouth daily.  . balsalazide (COLAZAL) 750 MG capsule TAKE THREE CAPSULES BY MOUTH THREE TIMES DAILY  . Cinnamon 500 MG TABS Take 1 tablet by mouth daily.  [name] dicyclomine (BENTYL) 10 MG capsule Take 1 capsule (10 mg total) by mouth 4 (four) times daily -  before meals and at bedtime.  [name] glipiZIDE (GLUCOTROL) 10 MG tablet Take 1 tablet (10 mg total) by mouth daily.  [name] glucose blood test strip One Touch verio test strips. Test bid. E11.9  . hydrochlorothiazide (HYDRODIURIL) 12.5 MG tablet   . hydrochlorothiazide (HYDRODIURIL)  12.5 MG tablet TAKE ONE TABLET BY MOUTH ONCE DAILY  . insulin NPH Human (HUMULIN N,NOVOLIN N) 100 UNIT/ML injection 22 units in the morning and 15 units at night (Patient taking differently: 20 units in the morning and 15 units at night)  . Insulin Syringes, Disposable, U-100 0.5 ML MISC Give insulin twice daily as directed  . lisinopril (PRINIVIL,ZESTRIL) 20 MG tablet Take 1 tablet (20 mg total) by mouth daily.  [name] lisinopril-hydrochlorothiazide (ZESTORETIC) 20-12.5 MG tablet Take 1 tablet by mouth daily.  . meclizine (ANTIVERT) 25 MG tablet Take 1 tablet (25 mg total) by mouth 3 (three) times daily as needed for dizziness.   No facility-administered encounter medications on file as of 11/29/2015.      Review of Systems  Constitutional: Positive for fatigue.  Respiratory: Negative.   Cardiovascular: Negative.   Neurological: Negative.   Psychiatric/Behavioral: Negative.        Objective:   Physical Exam  Constitutional: She is oriented to person, place, and time. She appears well-developed and well-nourished.  Cardiovascular: Normal rate, regular rhythm, normal heart sounds and intact distal pulses.   Pulmonary/Chest: Effort normal and breath sounds normal.  Neurological: She is alert and oriented to person, place, and time.          Assessment & Plan:  1. Diabetes mellitus without complication (HCC) U9N has gone up slightly from 8.3 8.6. Will increase a.m. dose of NPH from 20-26 continue 15 at night with Glucotrol. Patient is taking some diabetes education classes hopefully that will help.  Wardell Honour MD - Bayer DCA Hb A1c [place]

## 2015-11-29 NOTE — Addendum Note (Signed)
Addended by: Jamelle Haring on: 11/29/2015 09:02 AM   Modules accepted: Orders

## 2015-11-30 LAB — BMP8+EGFR
BUN / CREAT RATIO: 22 (ref 12–28)
BUN: 17 mg/dL (ref 8–27)
CO2: 26 mmol/L (ref 18–29)
CREATININE: 0.77 mg/dL (ref 0.57–1.00)
Calcium: 9.4 mg/dL (ref 8.7–10.3)
Chloride: 104 mmol/L (ref 96–106)
GFR calc Af Amer: 91 mL/min/{1.73_m2} (ref >59)
GFR, EST NON AFRICAN AMERICAN: 79 mL/min/{1.73_m2} (ref >59)
GLUCOSE: 102 mg/dL — AB (ref 65–99)
POTASSIUM: 4.2 mmol/L (ref 3.5–5.2)
SODIUM: 145 mmol/L — AB (ref 134–144)

## 2015-11-30 LAB — CBC WITH DIFFERENTIAL/PLATELET
BASOS ABS: 0 10*3/uL (ref 0.0–0.2)
Basos: 0 %
EOS (ABSOLUTE): 0.1 10*3/uL (ref 0.0–0.4)
Eos: 2 %
HEMOGLOBIN: 10.9 g/dL — AB (ref 11.1–15.9)
Hematocrit: 34.8 % (ref 34.0–46.6)
Immature Grans (Abs): 0 10*3/uL (ref 0.0–0.1)
Immature Granulocytes: 0 %
LYMPHS ABS: 2.3 10*3/uL (ref 0.7–3.1)
Lymphs: 31 %
MCH: 27.9 pg (ref 26.6–33.0)
MCHC: 31.3 g/dL — AB (ref 31.5–35.7)
MCV: 89 fL (ref 79–97)
MONOS ABS: 0.5 10*3/uL (ref 0.1–0.9)
Monocytes: 7 %
NEUTROS ABS: 4.5 10*3/uL (ref 1.4–7.0)
Neutrophils: 60 %
Platelets: 366 10*3/uL (ref 150–379)
RBC: 3.9 x10E6/uL (ref 3.77–5.28)
RDW: 14.1 % (ref 12.3–15.4)
WBC: 7.5 10*3/uL (ref 3.4–10.8)

## 2015-12-04 ENCOUNTER — Encounter: Payer: Medicare HMO | Admitting: *Deleted

## 2015-12-04 DIAGNOSIS — Z1231 Encounter for screening mammogram for malignant neoplasm of breast: Secondary | ICD-10-CM | POA: Diagnosis not present

## 2015-12-04 LAB — HM MAMMOGRAPHY

## 2015-12-13 ENCOUNTER — Encounter: Payer: Self-pay | Admitting: *Deleted

## 2015-12-20 ENCOUNTER — Other Ambulatory Visit: Payer: Self-pay | Admitting: Family Medicine

## 2015-12-21 ENCOUNTER — Other Ambulatory Visit: Payer: Self-pay | Admitting: Family Medicine

## 2015-12-22 DIAGNOSIS — R69 Illness, unspecified: Secondary | ICD-10-CM | POA: Diagnosis not present

## 2016-01-04 ENCOUNTER — Other Ambulatory Visit: Payer: Self-pay | Admitting: Family Medicine

## 2016-01-10 ENCOUNTER — Telehealth: Payer: Self-pay | Admitting: Internal Medicine

## 2016-01-10 MED ORDER — DICYCLOMINE HCL 10 MG PO CAPS: 10.0000 mg | ORAL_CAPSULE | Freq: Three times a day (TID) | ORAL | Status: DC

## 2016-01-10 NOTE — Addendum Note (Signed)
Addended by: Mahala Menghini on: 01/10/2016 04:32 PM   Modules accepted: Orders

## 2016-01-10 NOTE — Telephone Encounter (Signed)
Routing to the refill box. 

## 2016-01-10 NOTE — Telephone Encounter (Signed)
Pt called to see if she could get a refill on her Dicyclomine 10 mg. She has 2 more days worth. She uses [facility] in [place].

## 2016-01-16 ENCOUNTER — Other Ambulatory Visit: Payer: Self-pay | Admitting: Family Medicine

## 2016-02-05 ENCOUNTER — Other Ambulatory Visit: Payer: Self-pay | Admitting: Family Medicine

## 2016-03-01 ENCOUNTER — Other Ambulatory Visit: Payer: Self-pay | Admitting: Family Medicine

## 2016-03-01 ENCOUNTER — Ambulatory Visit: Payer: Medicare HMO | Admitting: Family Medicine

## 2016-03-06 ENCOUNTER — Ambulatory Visit (INDEPENDENT_AMBULATORY_CARE_PROVIDER_SITE_OTHER): Payer: Medicare HMO | Admitting: Family Medicine

## 2016-03-06 ENCOUNTER — Encounter: Payer: Self-pay | Admitting: Family Medicine

## 2016-03-06 ENCOUNTER — Encounter (INDEPENDENT_AMBULATORY_CARE_PROVIDER_SITE_OTHER): Payer: Self-pay

## 2016-03-06 VITALS — BP 128/75 | HR 71 | Temp 96.8°F | Ht 62.0 in | Wt 183.0 lb

## 2016-03-06 DIAGNOSIS — E785 Hyperlipidemia, unspecified: Secondary | ICD-10-CM

## 2016-03-06 DIAGNOSIS — E119 Type 2 diabetes mellitus without complications: Secondary | ICD-10-CM

## 2016-03-06 DIAGNOSIS — I10 Essential (primary) hypertension: Secondary | ICD-10-CM | POA: Diagnosis not present

## 2016-03-06 LAB — BAYER DCA HB A1C WAIVED: HB A1C: 8.3 % — AB (ref <7.0)

## 2016-03-06 MED ORDER — INSULIN NPH (HUMAN) (ISOPHANE) 100 UNIT/ML ~~LOC~~ SUSP: SUBCUTANEOUS | 2 refills | Status: DC

## 2016-03-06 MED ORDER — LISINOPRIL-HYDROCHLOROTHIAZIDE 20-12.5 MG PO TABS: 1.0000 | ORAL_TABLET | Freq: Every day | ORAL | 1 refills | Status: DC

## 2016-03-06 MED ORDER — GLIPIZIDE 10 MG PO TABS: 10.0000 mg | ORAL_TABLET | Freq: Every day | ORAL | 1 refills | Status: DC

## 2016-03-06 MED ORDER — ATORVASTATIN CALCIUM 40 MG PO TABS: 40.0000 mg | ORAL_TABLET | Freq: Every day | ORAL | 1 refills | Status: DC

## 2016-03-06 NOTE — Progress Notes (Signed)
Subjective:    Patient ID: Felicia Pugh, female    DOB: Dec 22, 1945, 70 y.o.   MRN: [id number]  HPI 70 year old female here to follow-up diabetes and Crohn's disease and lipids. Since her last visit she's had a flareup of her Crohn's and one of her remedies is to eat lots of crackers. This raises her sugar. At last visit we increased her insulin. She does have concern about a dark lays on her leg. It has not changed but there is some discomfort associated. 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  Patient Active Problem List   Diagnosis Date Noted  . Loss of weight 06/21/2015  . Vertigo 05/16/2015  . HLD (hyperlipidemia) 05/16/2015  . Type 2 diabetes mellitus without complication, without long-term current use of insulin ([place]) 05/16/2015  . Crohn's disease with complication ([place]) 63/14/9702  . Crohn's disease ([place]) 04/12/2015  . Mucosal abnormality of colon   . Diverticulosis of colon without hemorrhage   . Anemia 02/28/2015  . History of stroke 07/08/2014  . Osteopenia 05/11/2014  . Diabetes mellitus without complication ([place]) 63/78/5885  . Hyperlipidemia with target LDL less than 100 06/01/2008  . Anemia, chronic disease 06/01/2008  . Essential hypertension 06/01/2008  . HEMORRHOIDS, INTERNAL 06/01/2008  . IRRITABLE BOWEL SYNDROME 06/01/2008  . HEMOCCULT POSITIVE STOOL 06/01/2008  . 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 WEIGHT GAIN 06/01/2008  . FECAL INCONTINENCE 06/01/2008  . DIARRHEA 06/01/2008   Outpatient Encounter Prescriptions as of 03/06/2016  Medication Sig  . atorvastatin (LIPITOR) 40 MG tablet TAKE ONE TABLET BY MOUTH ONCE DAILY  . balsalazide (COLAZAL) 750 MG capsule TAKE THREE CAPSULES BY MOUTH THREE TIMES DAILY  . dicyclomine (BENTYL) 10 MG capsule Take 1 capsule (10 mg total) by mouth 4 (four) times daily -  before meals and at bedtime. As needed for cramps or diarrhea  . glipiZIDE (GLUCOTROL) 10 MG tablet TAKE ONE TABLET BY MOUTH ONCE DAILY  . glucose  blood (ONETOUCH VERIO) test strip Test bid. DX E11.9  . Insulin Syringes, Disposable, U-100 0.5 ML MISC Give insulin twice daily as directed  . lisinopril-hydrochlorothiazide (PRINZIDE,ZESTORETIC) 20-12.5 MG tablet TAKE ONE TABLET BY MOUTH ONCE DAILY  . NOVOLIN N RELION 100 UNIT/ML injection INJECT 22 UNITS IN THE MORNING AND THEN INJECT 15 UNITS AT NIGHT  . [DISCONTINUED] Cinnamon 500 MG TABS Take 1 tablet by mouth daily.  . [DISCONTINUED] hydrochlorothiazide (HYDRODIURIL) 12.5 MG tablet TAKE ONE TABLET BY MOUTH ONCE DAILY   No facility-administered encounter medications on file as of 03/06/2016.       Review of Systems  Constitutional: Negative.   Respiratory: Negative.   Cardiovascular: Negative.   Gastrointestinal: Positive for abdominal pain.  Musculoskeletal: Negative.   Skin: Positive for color change.  Psychiatric/Behavioral: Negative.        Objective:   Physical Exam  Constitutional: She appears well-developed and well-nourished.  Cardiovascular: Normal rate, regular rhythm, normal heart sounds and intact distal pulses.   Pulmonary/Chest: Effort normal and breath sounds normal.  Skin:  Area of concern on her leg appears to be a small thrombosis of the superficial varicosity. Area was examined with the aid of the dermal light. There do not appear to the any increased vascular markings.  Psychiatric: She has a normal mood and affect. Her behavior is normal.   BP 128/75 (BP Location: Left Arm, Patient Position: Sitting, Cuff Size: Normal)   Pulse 71   Temp (!) 96.8 F (36 C) (Oral)   Ht 5' 2"  (1.575 m)  Wt 183 lb (83 kg)   BMI 33.47 kg/m         Assessment & Plan:  1. Essential hypertension Blood pressure is well controlled on lisinopril and hydrochlorothiazide  2. Type 2 diabetes mellitus without complication, without long-term current use of insulin (HCC) Still not at goal. A1c 3 months ago was 8.6. Did some education with the relevance of A1c's and how that  really translates into her blood glucose levels - Microalbumin / creatinine urine ratio - Bayer DCA Hb A1c Waived  3. Hyperlipidemia with target LDL less than 100 She is at target with LDL of 83. Tolerating statin well. Will retest in 6 months  Wardell Honour MD

## 2016-03-07 LAB — CMP14+EGFR
ALBUMIN: 4.4 g/dL (ref 3.5–4.8)
ALK PHOS: 121 IU/L — AB (ref 39–117)
ALT: 14 IU/L (ref 0–32)
AST: 21 IU/L (ref 0–40)
Albumin/Globulin Ratio: 1.6 (ref 1.2–2.2)
BUN/Creatinine Ratio: 28 (ref 12–28)
BUN: 21 mg/dL (ref 8–27)
Bilirubin Total: 0.3 mg/dL (ref 0.0–1.2)
CO2: 26 mmol/L (ref 18–29)
CREATININE: 0.74 mg/dL (ref 0.57–1.00)
Calcium: 9.5 mg/dL (ref 8.7–10.3)
Chloride: 104 mmol/L (ref 96–106)
GFR calc Af Amer: 95 mL/min/{1.73_m2} (ref >59)
GFR calc non Af Amer: 82 mL/min/{1.73_m2} (ref >59)
GLUCOSE: 154 mg/dL — AB (ref 65–99)
Globulin, Total: 2.8 g/dL (ref 1.5–4.5)
Potassium: 4.1 mmol/L (ref 3.5–5.2)
Sodium: 145 mmol/L — ABNORMAL HIGH (ref 134–144)
Total Protein: 7.2 g/dL (ref 6.0–8.5)

## 2016-03-07 LAB — MICROALBUMIN / CREATININE URINE RATIO
CREATININE, UR: 161.3 mg/dL
MICROALB/CREAT RATIO: 5.3 mg/g creat (ref 0.0–30.0)
MICROALBUM., U, RANDOM: 8.5 ug/mL

## 2016-03-11 ENCOUNTER — Encounter: Payer: Medicare HMO | Admitting: *Deleted

## 2016-03-19 ENCOUNTER — Other Ambulatory Visit: Payer: Self-pay | Admitting: Nurse Practitioner

## 2016-03-20 ENCOUNTER — Other Ambulatory Visit: Payer: Self-pay

## 2016-03-21 ENCOUNTER — Telehealth: Payer: Self-pay | Admitting: Internal Medicine

## 2016-03-21 NOTE — Telephone Encounter (Signed)
Refill has been sent tot he pharmacy under the refill request box.

## 2016-03-21 NOTE — Telephone Encounter (Signed)
There are 2 notes in the refill box for colazal for this pt. Routing to the refill box.

## 2016-03-21 NOTE — Telephone Encounter (Signed)
PATIENT CALLED INQUIRING ON THE REFILL OF HER MEDICATION

## 2016-03-26 DIAGNOSIS — R69 Illness, unspecified: Secondary | ICD-10-CM | POA: Diagnosis not present

## 2016-04-23 ENCOUNTER — Ambulatory Visit (INDEPENDENT_AMBULATORY_CARE_PROVIDER_SITE_OTHER): Payer: Medicare HMO | Admitting: Gastroenterology

## 2016-04-23 ENCOUNTER — Encounter: Payer: Self-pay | Admitting: Gastroenterology

## 2016-04-23 VITALS — BP 142/81 | HR 82 | Temp 97.8°F | Ht 62.5 in | Wt 176.0 lb

## 2016-04-23 DIAGNOSIS — D6489 Other specified anemias: Secondary | ICD-10-CM

## 2016-04-23 DIAGNOSIS — K5090 Crohn's disease, unspecified, without complications: Secondary | ICD-10-CM | POA: Diagnosis not present

## 2016-04-23 LAB — CBC
HEMATOCRIT: 34.3 % — AB (ref 35.0–45.0)
HEMOGLOBIN: 10.9 g/dL — AB (ref 11.7–15.5)
MCH: 27.5 pg (ref 27.0–33.0)
MCHC: 31.8 g/dL — ABNORMAL LOW (ref 32.0–36.0)
MCV: 86.6 fL (ref 80.0–100.0)
MPV: 8.8 fL (ref 7.5–12.5)
Platelets: 323 10*3/uL (ref 140–400)
RBC: 3.96 MIL/uL (ref 3.80–5.10)
RDW: 13.7 % (ref 11.0–15.0)
WBC: 7.1 10*3/uL (ref 3.8–10.8)

## 2016-04-23 LAB — IRON AND TIBC
%SAT: 25 % (ref 11–50)
Iron: 68 ug/dL (ref 45–160)
TIBC: 268 ug/dL (ref 250–450)
UIBC: 200 ug/dL (ref 125–400)

## 2016-04-23 NOTE — Progress Notes (Signed)
CC'ED TO PCP 

## 2016-04-23 NOTE — Patient Instructions (Signed)
Please have blood work done today.  We will see you back in 6 months!  Happy 70th Birthday! (I know it's a little late, but you should celebrate it for awhile!)

## 2016-04-23 NOTE — Assessment & Plan Note (Signed)
Chronic disease. Patient reports worsening fatigue. Recheck anemia panel now.

## 2016-04-23 NOTE — Progress Notes (Signed)
Referring Provider: Wardell Honour, MD Primary Care Physician:  Wardell Honour, MD  Primary GI: Dr. Gala Romney   Chief Complaint  Patient presents with  . Follow-up    loose stool is better    HPI:   Felicia Pugh is a 70 y.o. female presenting today with a history of newly diagnosed Crohn's, insurance not approving Lialda. Prescribed Colazal 04/05/15. Seen May 17, 2015 with intermittent abdominal cramping and occasional loose stool, with question of dietary choices exacerbating. CRP, sed rate, celiac serologies, TSH, fecal elastase all normal at that time. Has anemia of chronic disease. Was prescribed Viberzi samples in case of IBS overlay, which improved her symptoms. However, insurance did not cover this. CTE was ordered and showed an abnormal area in the left inguinal groin, which turned out to be just prominent varices on [place]. Also noted 69m hypodense lesion in the dome of right hepatic lobe, statistically high to be benign. Mild fatty liver.   Feeling a bit tired. Salads give loose stools. Trying to cut back on breads by eating only one piece of bread with sandwiches instead of 2. Using whole wheat. No rectal bleeding. No abdominal pain. Apple and peanut butter causes cramping. Bentyl as needed. Overall feels well.   Past Medical History:  Diagnosis Date  . 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 KSonny Dandy no longer seeing anyone (02/2015)  . Cataract   . Complication of anesthesia    difficulty breathing after waking up from anesthesia  . Crohn's disease ([place]   . Diabetes mellitus without complication ([place]   . Diabetic neuropathy ([place]   . Hyperlipidemia   . Hypertension   . IBS (irritable bowel syndrome)   . Stroke ([facility] 2011  . Syncope and collapse   . Thyroid nodule   . Ulcer     Past Surgical History:  Procedure Laterality Date  . ABDOMINAL HYSTERECTOMY     partial  . BACK SURGERY     x3 (Dr KLoyola Mast Dr CPatrice [place]  . BREAST SURGERY Right 2002   cyst  removed  . COLONOSCOPY  10/102006   RMR: normal  . COLONOSCOPY  09/06/2009   RMR: Suboptimal prep on the right side. Ulcers noted at cecum and in the descending colon. Terminal ileum could not be intubated. Ounces with chronic active colitis, question Crohn's  . COLONOSCOPY N/A 03/27/2015   Dr. RGala Romney abnormal cecum and IC valve most consistent with IBD. TI intubated and appeared normal. Likely Crohn's disease. Patient denies NSAID use. No improvement with Lialda samples at time of initial diagnosis  . ESOPHAGOGASTRODUODENOSCOPY    . Small bowel capsule endoscopy  06/10/2005   RMR: single erosion versus AVM, distal ileum  . Small bowel capsule endoscopy  prior to 05/2005   [place]: reported erosions/ulcerations per medical record. actual report unavailable.    Current Outpatient Prescriptions  Medication Sig Dispense Refill  . atorvastatin (LIPITOR) 40 MG tablet Take 1 tablet (40 mg total) by mouth daily. 90 tablet 1  . balsalazide (COLAZAL) 750 MG capsule TAKE THREE CAPSULES BY MOUTH THREE TIMES DAILY 270 capsule 3  . dicyclomine (BENTYL) 10 MG capsule Take 1 capsule (10 mg total) by mouth 4 (four) times daily -  before meals and at bedtime. As needed for cramps or diarrhea 120 capsule 3  . glipiZIDE (GLUCOTROL) 10 MG tablet Take 1 tablet (10 mg total) by mouth daily. 90 tablet 1  . glucose blood (ONETOUCH VERIO) test strip Test bid.  DX E11.9 100 each 5  . insulin NPH Human (NOVOLIN N RELION) 100 UNIT/ML injection INJECT 22 UNITS IN THE MORNING AND THEN INJECT 15 UNITS AT NIGHT (Patient taking differently: INJECT 26 UNITS IN THE MORNING AND THEN INJECT 15 UNITS AT NIGHT) 10 mL 2  . Insulin Syringes, Disposable, U-100 0.5 ML MISC Give insulin twice daily as directed 100 each 1  . lisinopril-hydrochlorothiazide (PRINZIDE,ZESTORETIC) 20-12.5 MG tablet Take 1 tablet by mouth daily. 90 tablet 1  . Multiple Vitamin (MULTIVITAMIN) tablet Take 1 tablet by mouth daily.     No current  facility-administered medications for this visit.     Allergies as of 04/23/2016 - Review Complete 04/23/2016  Allergen Reaction Noted  . Metformin and related  05/11/2014  . Asa [aspirin]  11/04/2012  . Nsaids  11/04/2012  . Other Diarrhea 12/25/2014    Family History  Problem Relation Age of Onset  . Hypertension Mother   . Stroke Mother   . Diabetes Father     w/ BKA  . Hypertension Father   . Cancer Sister   . Diabetes Sister     stomach  . Diabetes Brother   . Diabetes Sister   . Cancer Sister     breast  . Hypertension Sister   . Colon cancer Neg Hx   . Inflammatory bowel disease Neg Hx     Social History   Social History  . Marital status: Married    Spouse name: N/A  . Number of children: 5  . Years of education: N/A   Social History Main Topics  . Smoking status: Never Smoker  . Smokeless tobacco: Never Used  . Alcohol use No  . Drug use: No  . Sexual activity: No   Other Topics Concern  . None   Social History Narrative  . None    Review of Systems: Negative unless mentioned in HPI   Physical Exam: BP (!) 142/81   Pulse 82   Temp 97.8 F (36.6 C) (Oral)   Ht 5' 2.5" (1.588 m)   Wt 176 lb (79.8 kg)   BMI 31.68 kg/m  General:   Alert and oriented. No distress noted. Pleasant and cooperative.  Head:  Normocephalic and atraumatic. Eyes:  Conjuctiva clear without scleral icterus. Abdomen:  +BS, soft, non-tender and non-distended. No rebound or guarding. No HSM or masses noted. Msk:  Symmetrical without gross deformities. Normal posture. Extremities:  Without edema. Neurologic:  Alert and  oriented x4;  grossly normal neurologically. Psych:  Alert and cooperative. Normal mood and affect.   Lab Results  Component Value Date   ALT 14 03/06/2016   AST 21 03/06/2016   ALKPHOS 121 (H) 03/06/2016   BILITOT 0.3 03/06/2016   Lab Results  Component Value Date   CREATININE 0.74 03/06/2016   BUN 21 03/06/2016   NA 145 (H) 03/06/2016   K  4.1 03/06/2016   CL 104 03/06/2016   CO2 26 03/06/2016   Lab Results  Component Value Date   WBC 7.5 11/29/2015   HGB 11.7 (L) 06/21/2015   HCT 34.8 11/29/2015   MCV 89 11/29/2015   PLT 366 11/29/2015

## 2016-04-23 NOTE — Assessment & Plan Note (Signed)
Maintained on Colazal, Bentyl for IBS overlay. Dietary modification helping significantly. At baseline and doing well. Return in 6 months.

## 2016-04-24 LAB — VITAMIN B12: VITAMIN B 12: 607 pg/mL (ref 200–1100)

## 2016-04-24 LAB — FOLATE

## 2016-04-24 LAB — FERRITIN: FERRITIN: 179 ng/mL (ref 20–288)

## 2016-05-11 NOTE — Progress Notes (Signed)
Hgb stable overall. Would follow-up with PCP for any worsening for fatigue. Anemia of chronic disease.

## 2016-06-08 ENCOUNTER — Other Ambulatory Visit: Payer: Self-pay | Admitting: Family Medicine

## 2016-06-10 NOTE — Progress Notes (Signed)
Subjective:    Patient ID: Felicia Pugh, female    DOB: 11/21/1945, 70 y.o.   MRN: [id number]  HPI 70 year old female with Crohn's disease and diabetes. Unfortunately she her diet is limited Crohn's disease and she eats more bread because is tolerated. Her last A1c was 8.3. She currently takes NPH insulin at 26 and 15 units a day and glipizide. When sugars are checked at home and very greatly depending on her diet. She has some complaints of pains in her left leg probably related more to varicosities then to neuropathy.  Patient Active Problem List   Diagnosis Date Noted  . Loss of weight 06/21/2015  . Vertigo 05/16/2015  . HLD (hyperlipidemia) 05/16/2015  . Type 2 diabetes mellitus without complication, without long-term current use of insulin ([place]) 05/16/2015  . Crohn's disease with complication ([place]) 02/77/4128  . Crohn's disease ([place]) 04/12/2015  . Mucosal abnormality of colon   . Diverticulosis of colon without hemorrhage   . Anemia 02/28/2015  . History of stroke 07/08/2014  . Osteopenia 05/11/2014  . Diabetes mellitus without complication ([place]) 78/67/6720  . Hyperlipidemia with target LDL less than 100 06/01/2008  . Anemia, chronic disease 06/01/2008  . Essential hypertension 06/01/2008  . HEMORRHOIDS, INTERNAL 06/01/2008  . IRRITABLE BOWEL SYNDROME 06/01/2008  . HEMOCCULT POSITIVE STOOL 06/01/2008  . 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 WEIGHT GAIN 06/01/2008  . FECAL INCONTINENCE 06/01/2008  . DIARRHEA 06/01/2008   Outpatient Encounter Prescriptions as of 06/11/2016  Medication Sig  . atorvastatin (LIPITOR) 40 MG tablet Take 1 tablet (40 mg total) by mouth daily.  . balsalazide (COLAZAL) 750 MG capsule TAKE THREE CAPSULES BY MOUTH THREE TIMES DAILY  . dicyclomine (BENTYL) 10 MG capsule Take 1 capsule (10 mg total) by mouth 4 (four) times daily -  before meals and at bedtime. As needed for cramps or diarrhea  . glipiZIDE (GLUCOTROL) 10 MG tablet Take 1 tablet (10 mg total) by  mouth daily.  [name] glucose blood (ONETOUCH VERIO) test strip Test bid. DX E11.9  . Insulin Syringes, Disposable, U-100 0.5 ML MISC Give insulin twice daily as directed  . lisinopril-hydrochlorothiazide (PRINZIDE,ZESTORETIC) 20-12.5 MG tablet Take 1 tablet by mouth daily.  . Multiple Vitamin (MULTIVITAMIN) tablet Take 1 tablet by mouth daily.  [name] NOVOLIN N RELION 100 UNIT/ML injection INJECT 22 UNITS IN THE MORNING AND THEN INJECT 15 UNITS AT NIGHT   No facility-administered encounter medications on file as of 06/11/2016.       Review of Systems  Constitutional: Negative.   Respiratory: Negative.   Cardiovascular: Negative.   Neurological: Negative.   Psychiatric/Behavioral: Negative.        Objective:   Physical Exam  Constitutional: She is oriented to person, place, and time. She appears well-developed and well-nourished.  Cardiovascular: Normal rate, regular rhythm, normal heart sounds and intact distal pulses.   Pulmonary/Chest: Effort normal and breath sounds normal.  Neurological: She is alert and oriented to person, place, and time.  Psychiatric: She has a normal mood and affect. Her behavior is normal. Thought content normal.   BP 123/73   Pulse 80   Temp 97.1 F (36.2 C) (Oral)   Ht 5' 2.5" (1.588 m)   Wt 187 lb 6.4 oz (85 kg)   BMI 33.73 kg/m         Assessment & Plan:  1. Diabetes mellitus without complication (HCC) We'll check A1c today. I expect A1c will be elevated. Regarding risk factors, blood pressure and lipids need to be  maximized since her sugars are not as well controlled due to ability to eat certain foods - Bayer DCA Hb A1c Waived  2. Essential hypertension Pressure well controlled on current regimen of lisinopril and hydrochlorothiazide  Wardell Honour MD

## 2016-06-11 ENCOUNTER — Ambulatory Visit (INDEPENDENT_AMBULATORY_CARE_PROVIDER_SITE_OTHER): Payer: Medicare HMO | Admitting: Family Medicine

## 2016-06-11 ENCOUNTER — Encounter: Payer: Self-pay | Admitting: Family Medicine

## 2016-06-11 VITALS — BP 123/73 | HR 80 | Temp 97.1°F | Ht 62.5 in | Wt 187.4 lb

## 2016-06-11 DIAGNOSIS — E119 Type 2 diabetes mellitus without complications: Secondary | ICD-10-CM

## 2016-06-11 DIAGNOSIS — Z23 Encounter for immunization: Secondary | ICD-10-CM

## 2016-06-11 DIAGNOSIS — I10 Essential (primary) hypertension: Secondary | ICD-10-CM

## 2016-06-11 LAB — BAYER DCA HB A1C WAIVED: HB A1C: 8.1 % — AB (ref <7.0)

## 2016-06-11 MED ORDER — INSULIN NPH (HUMAN) (ISOPHANE) 100 UNIT/ML ~~LOC~~ SUSP: SUBCUTANEOUS | 6 refills | Status: DC

## 2016-06-17 ENCOUNTER — Other Ambulatory Visit: Payer: Self-pay | Admitting: Gastroenterology

## 2016-06-20 ENCOUNTER — Telehealth: Payer: Self-pay | Admitting: Internal Medicine

## 2016-06-20 ENCOUNTER — Other Ambulatory Visit: Payer: Self-pay

## 2016-06-20 NOTE — Telephone Encounter (Signed)
Yes, Im working on a PA for this

## 2016-06-20 NOTE — Telephone Encounter (Signed)
Pt said that she had called earlier about her medication. She said it was dicyclomine and she had one pill left and it would cost her $40 to get and said that she would need to get in touch with her insurance. JL, does she have a PA? She uses [facility] in [place].

## 2016-06-21 MED ORDER — DICYCLOMINE HCL 10 MG PO CAPS: ORAL_CAPSULE | ORAL | 3 refills | Status: DC

## 2016-07-02 DIAGNOSIS — R69 Illness, unspecified: Secondary | ICD-10-CM | POA: Diagnosis not present

## 2016-07-20 ENCOUNTER — Inpatient Hospital Stay (HOSPITAL_COMMUNITY)
Admission: EM | Admit: 2016-07-20 | Discharge: 2016-07-25 | DRG: 342 | Disposition: A | Discharge status: Home / Self Care | Payer: Medicare HMO | Attending: General Surgery | Admitting: General Surgery

## 2016-07-20 ENCOUNTER — Emergency Department (HOSPITAL_COMMUNITY): Payer: Medicare HMO

## 2016-07-20 ENCOUNTER — Encounter (HOSPITAL_COMMUNITY): Payer: Self-pay | Admitting: Emergency Medicine

## 2016-07-20 DIAGNOSIS — Z90710 Acquired absence of both cervix and uterus: Secondary | ICD-10-CM

## 2016-07-20 DIAGNOSIS — Z8249 Family history of ischemic heart disease and other diseases of the circulatory system: Secondary | ICD-10-CM | POA: Diagnosis not present

## 2016-07-20 DIAGNOSIS — R0600 Dyspnea, unspecified: Secondary | ICD-10-CM | POA: Diagnosis not present

## 2016-07-20 DIAGNOSIS — Z823 Family history of stroke: Secondary | ICD-10-CM

## 2016-07-20 DIAGNOSIS — Z833 Family history of diabetes mellitus: Secondary | ICD-10-CM

## 2016-07-20 DIAGNOSIS — Z8673 Personal history of transient ischemic attack (TIA), and cerebral infarction without residual deficits: Secondary | ICD-10-CM

## 2016-07-20 DIAGNOSIS — K3580 Unspecified acute appendicitis: Principal | ICD-10-CM | POA: Diagnosis present

## 2016-07-20 DIAGNOSIS — R0902 Hypoxemia: Secondary | ICD-10-CM | POA: Diagnosis not present

## 2016-07-20 DIAGNOSIS — K37 Unspecified appendicitis: Secondary | ICD-10-CM | POA: Diagnosis present

## 2016-07-20 DIAGNOSIS — E876 Hypokalemia: Secondary | ICD-10-CM | POA: Diagnosis not present

## 2016-07-20 DIAGNOSIS — Z888 Allergy status to other drugs, medicaments and biological substances status: Secondary | ICD-10-CM | POA: Diagnosis not present

## 2016-07-20 DIAGNOSIS — K5090 Crohn's disease, unspecified, without complications: Secondary | ICD-10-CM | POA: Diagnosis present

## 2016-07-20 DIAGNOSIS — I10 Essential (primary) hypertension: Secondary | ICD-10-CM | POA: Diagnosis present

## 2016-07-20 DIAGNOSIS — Z886 Allergy status to analgesic agent status: Secondary | ICD-10-CM

## 2016-07-20 DIAGNOSIS — E785 Hyperlipidemia, unspecified: Secondary | ICD-10-CM | POA: Diagnosis present

## 2016-07-20 DIAGNOSIS — R197 Diarrhea, unspecified: Secondary | ICD-10-CM | POA: Diagnosis not present

## 2016-07-20 DIAGNOSIS — E1152 Type 2 diabetes mellitus with diabetic peripheral angiopathy with gangrene: Secondary | ICD-10-CM | POA: Diagnosis not present

## 2016-07-20 DIAGNOSIS — E119 Type 2 diabetes mellitus without complications: Secondary | ICD-10-CM | POA: Diagnosis not present

## 2016-07-20 DIAGNOSIS — R0602 Shortness of breath: Secondary | ICD-10-CM

## 2016-07-20 DIAGNOSIS — E1140 Type 2 diabetes mellitus with diabetic neuropathy, unspecified: Secondary | ICD-10-CM | POA: Diagnosis not present

## 2016-07-20 DIAGNOSIS — J9601 Acute respiratory failure with hypoxia: Secondary | ICD-10-CM | POA: Diagnosis not present

## 2016-07-20 DIAGNOSIS — Z809 Family history of malignant neoplasm, unspecified: Secondary | ICD-10-CM

## 2016-07-20 DIAGNOSIS — Z794 Long term (current) use of insulin: Secondary | ICD-10-CM

## 2016-07-20 DIAGNOSIS — R1013 Epigastric pain: Secondary | ICD-10-CM | POA: Diagnosis not present

## 2016-07-20 LAB — COMPREHENSIVE METABOLIC PANEL
ALK PHOS: 124 U/L (ref 38–126)
ALT: 18 U/L (ref 14–54)
AST: 21 U/L (ref 15–41)
Albumin: 4.2 g/dL (ref 3.5–5.0)
Anion gap: 9 (ref 5–15)
BUN: 17 mg/dL (ref 6–20)
CALCIUM: 9.2 mg/dL (ref 8.9–10.3)
CO2: 27 mmol/L (ref 22–32)
CREATININE: 0.74 mg/dL (ref 0.44–1.00)
Chloride: 103 mmol/L (ref 101–111)
GFR calc non Af Amer: >60 mL/min (ref >60)
Glucose, Bld: 186 mg/dL — ABNORMAL HIGH (ref 65–99)
Potassium: 3.2 mmol/L — ABNORMAL LOW (ref 3.5–5.1)
SODIUM: 139 mmol/L (ref 135–145)
Total Bilirubin: 0.2 mg/dL — ABNORMAL LOW (ref 0.3–1.2)
Total Protein: 7.7 g/dL (ref 6.5–8.1)

## 2016-07-20 LAB — CBC WITH DIFFERENTIAL/PLATELET
Basophils Absolute: 0 10*3/uL (ref 0.0–0.1)
Basophils Relative: 0 %
EOS ABS: 0.1 10*3/uL (ref 0.0–0.7)
EOS PCT: 0 %
HCT: 36.6 % (ref 36.0–46.0)
Hemoglobin: 11.5 g/dL — ABNORMAL LOW (ref 12.0–15.0)
LYMPHS ABS: 1.6 10*3/uL (ref 0.7–4.0)
Lymphocytes Relative: 10 %
MCH: 28.3 pg (ref 26.0–34.0)
MCHC: 31.4 g/dL (ref 30.0–36.0)
MCV: 89.9 fL (ref 78.0–100.0)
Monocytes Absolute: 0.6 10*3/uL (ref 0.1–1.0)
Monocytes Relative: 4 %
Neutro Abs: 14.5 10*3/uL — ABNORMAL HIGH (ref 1.7–7.7)
Neutrophils Relative %: 86 %
PLATELETS: 346 10*3/uL (ref 150–400)
RBC: 4.07 MIL/uL (ref 3.87–5.11)
RDW: 13.9 % (ref 11.5–15.5)
WBC: 16.8 10*3/uL — AB (ref 4.0–10.5)

## 2016-07-20 LAB — LIPASE, BLOOD: Lipase: 49 U/L (ref 11–51)

## 2016-07-20 MED ORDER — FENTANYL CITRATE (PF) 100 MCG/2ML IJ SOLN
50.0000 ug | Freq: Once | INTRAMUSCULAR | Status: AC
  Administered 2016-07-20: 50 ug via INTRAVENOUS
  Filled 2016-07-20: qty 2

## 2016-07-20 MED ORDER — ONDANSETRON HCL 4 MG/2ML IJ SOLN
4.0000 mg | Freq: Once | INTRAMUSCULAR | Status: AC
  Administered 2016-07-20: 4 mg via INTRAVENOUS
  Filled 2016-07-20: qty 2

## 2016-07-20 MED ORDER — SODIUM CHLORIDE 0.9 % IV BOLUS (SEPSIS)
500.0000 mL | Freq: Once | INTRAVENOUS | Status: AC
Start: 2016-07-20 — End: 2016-07-21
  Administered 2016-07-20: 500 mL via INTRAVENOUS

## 2016-07-20 MED ORDER — SODIUM CHLORIDE 0.9 % IV BOLUS (SEPSIS)
1000.0000 mL | Freq: Once | INTRAVENOUS | Status: AC
  Administered 2016-07-20: 1000 mL via INTRAVENOUS

## 2016-07-20 NOTE — ED Provider Notes (Signed)
[facility] DEPT Provider Note   CSN: [id number] Arrival date & time: 07/20/16  2228  By signing my name below, I, Gwenlyn Fudge, attest that this documentation has been prepared under the direction and in the presence of Rolland Porter, MD. Electronically Signed: Gwenlyn Fudge, ED Scribe. 07/20/16. 11:43 PM.  Time seen 23:13 PM   History   Chief Complaint Chief Complaint  Patient presents with  . Abdominal Pain    since 1300 today, n/v/d   The history is provided by the patient. No language interpreter was used.   HPI Comments: Felicia Pugh is a 70 y.o. female with PMHx of Stroke, HLD, HTN, DM, IBS, and Crohn's Disease who presents to the Emergency Department complaining of gradual onset, constant, sharp epigastric abdominal pain onset this morning. This morning, pain began intermittently lasting a few seconds, but became constant at 3 PM. Pain does not radiate and no exacerbating or relieving factors are noted. She has not had similar pain before and is in a different location from Crohn's disease pain, which is usually RLQ. Pt reports associated nausea, lightheadedness/dizzineiss upon standing, dry heaves and diarrhea. She has had 5x episodes of diarrhea today. She reports abdominal PSHx of hysterectomy. She denies smoking and alcohol use. Denies fever, blood in diarrhea  PCP Dr Ammie Ferrier   Past Medical History:  Diagnosis Date  . 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 Cataract   . Complication of anesthesia    difficulty breathing after waking up from anesthesia  . Crohn's disease ([place])   . Diabetes mellitus without complication ([place])   . Diabetic neuropathy ([place])   . Hyperlipidemia   . Hypertension   . IBS (irritable bowel syndrome)   . Stroke [facility]) 2011  . Syncope and collapse   . Thyroid nodule   . Ulcer [facility])     Patient Active Problem List   Diagnosis Date Noted  . Appendicitis 07/21/2016  . Loss of weight  06/21/2015  . Vertigo 05/16/2015  . HLD (hyperlipidemia) 05/16/2015  . Type 2 diabetes mellitus without complication, without long-term current use of insulin ([place]) 05/16/2015  . Crohn's disease with complication ([place]) 75/91/6384  . Crohn's disease ([place]) 04/12/2015  . Mucosal abnormality of colon   . Diverticulosis of colon without hemorrhage   . Anemia 02/28/2015  . History of stroke 07/08/2014  . Osteopenia 05/11/2014  . Diabetes mellitus without complication ([place]) 66/59/9357  . Hyperlipidemia with target LDL less than 100 06/01/2008  . Anemia, chronic disease 06/01/2008  . Essential hypertension 06/01/2008  . HEMORRHOIDS, INTERNAL 06/01/2008  . IRRITABLE BOWEL SYNDROME 06/01/2008  . HEMOCCULT POSITIVE STOOL 06/01/2008  . 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 WEIGHT GAIN 06/01/2008  . FECAL INCONTINENCE 06/01/2008  . DIARRHEA 06/01/2008    Past Surgical History:  Procedure Laterality Date  . ABDOMINAL HYSTERECTOMY     partial  . BACK SURGERY     x3 (Dr Loyola Mast, Dr Patrice Paradise)  . BREAST SURGERY Right 2002   cyst removed  . COLONOSCOPY  10/102006   RMR: normal  . COLONOSCOPY  09/06/2009   RMR: Suboptimal prep on the right side. Ulcers noted at cecum and in the descending colon. Terminal ileum could not be intubated. Ounces with chronic active colitis, question Crohn's  . COLONOSCOPY N/A 03/27/2015   Dr. Gala Romney: abnormal cecum and IC valve most consistent with IBD. TI intubated and appeared normal. Likely Crohn's disease. Patient denies NSAID use. No improvement  with Lialda samples at time of initial diagnosis  . ESOPHAGOGASTRODUODENOSCOPY    . Small bowel capsule endoscopy  06/10/2005   RMR: single erosion versus AVM, distal ileum  . Small bowel capsule endoscopy  prior to 05/2005   [place]: reported erosions/ulcerations per medical record. actual report unavailable.    OB History    No data available       Home Medications    Prior to Admission medications   Medication Sig Start Date  End Date Taking? Authorizing Provider  atorvastatin (LIPITOR) 40 MG tablet Take 1 tablet (40 mg total) by mouth daily. 03/06/16   Wardell Honour, MD  balsalazide (COLAZAL) 750 MG capsule TAKE THREE CAPSULES BY MOUTH THREE TIMES DAILY 03/21/16   Carlis Stable, NP  dicyclomine (BENTYL) 10 MG capsule TAKE ONE CAPSULE BY MOUTH 4 TIMES DAILY WITH MEALS AND AT BEDTIME AS NEEDED FOR  CRAMPS  AND  DIARRHEA 06/21/16   Annitta Needs, NP  glipiZIDE (GLUCOTROL) 10 MG tablet Take 1 tablet (10 mg total) by mouth daily. 03/06/16   Wardell Honour, MD  glucose blood (ONETOUCH VERIO) test strip Test bid. DX E11.9 12/22/15   Wardell Honour, MD  insulin NPH Human (NOVOLIN N RELION) 100 UNIT/ML injection Inject 26 units in the morning and then 15 units at night 06/11/16   Wardell Honour, MD  Insulin Syringes, Disposable, U-100 0.5 ML MISC Give insulin twice daily as directed 09/19/14   Mary-Margaret Hassell Done, FNP  lisinopril-hydrochlorothiazide (PRINZIDE,ZESTORETIC) 20-12.5 MG tablet Take 1 tablet by mouth daily. 03/06/16   Wardell Honour, MD  Multiple Vitamin (MULTIVITAMIN) tablet Take 1 tablet by mouth daily.    Historical Provider, MD    Family History Family History  Problem Relation Age of Onset  . Hypertension Mother   . Stroke Mother   . Diabetes Father     w/ BKA  . Hypertension Father   . Cancer Sister   . Diabetes Sister     stomach  . Diabetes Brother   . Diabetes Sister   . Cancer Sister     breast  . Hypertension Sister   . Colon cancer Neg Hx   . Inflammatory bowel disease Neg Hx     Social History Social History  Substance Use Topics  . Smoking status: Never Smoker  . Smokeless tobacco: Never Used  . Alcohol use No  lives with spouse Lives at home   Allergies   Metformin and related; Asa [aspirin]; Nsaids; and Other   Review of Systems Review of Systems  Constitutional: Negative for fever.  Gastrointestinal: Positive for abdominal pain, diarrhea, nausea and vomiting. Negative  for blood in stool.  All other systems reviewed and are negative.  Physical Exam Updated Vital Signs BP 180/86 (BP Location: Left Arm)   Pulse 94   Temp 98.4 F (36.9 C) (Oral)   Resp 20   Ht 5' 2"  (1.575 m)   Wt 186 lb (84.4 kg)   SpO2 97%   BMI 34.02 kg/m   Vital signs normal except for hypertension   Physical Exam  Constitutional: She is oriented to person, place, and time. She appears well-developed and well-nourished.  Non-toxic appearance. She does not appear ill. No distress.  HENT:  Head: Normocephalic and atraumatic.  Right Ear: External ear normal.  Left Ear: External ear normal.  Nose: Nose normal. No mucosal edema or rhinorrhea.  Mouth/Throat: Mucous membranes are normal. No dental abscesses or uvula swelling.  Tongue was dry  Eyes: Conjunctivae and EOM are normal. Pupils are equal, round, and reactive to light.  Neck: Normal range of motion and full passive range of motion without pain. Neck supple.  Cardiovascular: Normal rate, regular rhythm and normal heart sounds.  Exam reveals no gallop and no friction rub.   No murmur heard. Pulmonary/Chest: Effort normal and breath sounds normal. No respiratory distress. She has no wheezes. She has no rhonchi. She has no rales. She exhibits no tenderness and no crepitus.  Abdominal: Soft. Normal appearance and bowel sounds are normal. She exhibits no distension. There is tenderness. There is no rebound and no guarding.    Tender diffusely in upper abdomen but she states worse in the left upper abdomen   Musculoskeletal: Normal range of motion. She exhibits no edema or tenderness.  Moves all extremities well.   Neurological: She is alert and oriented to person, place, and time. She has normal strength. No cranial nerve deficit.  Skin: Skin is warm, dry and intact. No rash noted. No erythema. No pallor.  Psychiatric: She has a normal mood and affect. Her speech is normal and behavior is normal. Her mood appears not anxious.    Nursing note and vitals reviewed.  ED Treatments / Results  DIAGNOSTIC STUDIES: Oxygen Saturation is 97% on RA, adequate by my interpretation.    Labs (all labs ordered are listed, but only abnormal results are displayed) Results for orders placed or performed during the hospital encounter of 07/20/16  Comprehensive metabolic panel  Result Value Ref Range   Sodium 139 135 - 145 mmol/L   Potassium 3.2 (L) 3.5 - 5.1 mmol/L   Chloride 103 101 - 111 mmol/L   CO2 27 22 - 32 mmol/L   Glucose, Bld 186 (H) 65 - 99 mg/dL   BUN 17 6 - 20 mg/dL   Creatinine, Ser 0.74 0.44 - 1.00 mg/dL   Calcium 9.2 8.9 - 10.3 mg/dL   Total Protein 7.7 6.5 - 8.1 g/dL   Albumin 4.2 3.5 - 5.0 g/dL   AST 21 15 - 41 U/L   ALT 18 14 - 54 U/L   Alkaline Phosphatase 124 38 - 126 U/L   Total Bilirubin 0.2 (L) 0.3 - 1.2 mg/dL   GFR calc non Af Amer >60 >60 mL/min   GFR calc Af Amer >60 >60 mL/min   Anion gap 9 5 - 15  Lipase, blood  Result Value Ref Range   Lipase 49 11 - 51 U/L  CBC with Differential  Result Value Ref Range   WBC 16.8 (H) 4.0 - 10.5 K/uL   RBC 4.07 3.87 - 5.11 MIL/uL   Hemoglobin 11.5 (L) 12.0 - 15.0 g/dL   HCT 36.6 36.0 - 46.0 %   MCV 89.9 78.0 - 100.0 fL   MCH 28.3 26.0 - 34.0 pg   MCHC 31.4 30.0 - 36.0 g/dL   RDW 13.9 11.5 - 15.5 %   Platelets 346 150 - 400 K/uL   Neutrophils Relative % 86 %   Neutro Abs 14.5 (H) 1.7 - 7.7 K/uL   Lymphocytes Relative 10 %   Lymphs Abs 1.6 0.7 - 4.0 K/uL   Monocytes Relative 4 %   Monocytes Absolute 0.6 0.1 - 1.0 K/uL   Eosinophils Relative 0 %   Eosinophils Absolute 0.1 0.0 - 0.7 K/uL   Basophils Relative 0 %   Basophils Absolute 0.0 0.0 - 0.1 K/uL  Urinalysis, Routine w reflex microscopic  Result Value Ref Range   Color, Urine  YELLOW YELLOW   APPearance HAZY (A) CLEAR   Specific Gravity, Urine 1.020 1.005 - 1.030   pH 6.0 5.0 - 8.0   Glucose, UA 50 (A) NEGATIVE mg/dL   Hgb urine dipstick NEGATIVE NEGATIVE   Bilirubin Urine NEGATIVE  NEGATIVE   Ketones, ur NEGATIVE NEGATIVE mg/dL   Protein, ur 100 (A) NEGATIVE mg/dL   Nitrite NEGATIVE NEGATIVE   Leukocytes, UA SMALL (A) NEGATIVE   RBC / HPF 0-5 0 - 5 RBC/hpf   WBC, UA 6-30 0 - 5 WBC/hpf   Bacteria, UA NONE SEEN NONE SEEN   Mucous PRESENT    Hyaline Casts, UA PRESENT    Laboratory interpretation all normal except leukocytosis, hypokalemia, hyperglycemia    EKG  EKG Interpretation  Date/Time:  Sunday July 21 2016 04:09:06 EST Ventricular Rate:  99 PR Interval:    QRS Duration: 107 QT Interval:  355 QTC Calculation: 456 R Axis:   44 Text Interpretation:  Sinus rhythm Abnormal R-wave progression, early transition Borderline T wave abnormalities No significant change since last tracing 26 Feb 2015 Confirmed by [place] Brogden  MD-I, Geneviene Tesch ([id number]) on 07/21/2016 5:02:14 AM       Radiology Ct Abdomen Pelvis W Contrast  Result Date: 07/21/2016 CLINICAL DATA:  History of Crohn disease. Gradually worsening epigastric abdominal pain since this morning. The pain became constant at 15:00 EXAM: CT ABDOMEN AND PELVIS WITH CONTRAST TECHNIQUE: Multidetector CT imaging of the abdomen and pelvis was performed using the standard protocol following bolus administration of intravenous contrast. CONTRAST:  17m ISOVUE-300 IOPAMIDOL (ISOVUE-300) INJECTION 61% COMPARISON:  08/11/2015 FINDINGS: Lower chest: Mild atelectatic appearing linear lung base opacities. No consolidation or effusion. Hepatobiliary: No focal liver abnormality is seen. No gallstones, gallbladder wall thickening, or biliary dilatation. Pancreas: Unremarkable. No pancreatic ductal dilatation or surrounding inflammatory changes. Spleen: Normal in size without focal abnormality. Adrenals/Urinary Tract: Adrenal glands are unremarkable. Kidneys are normal, without renal calculi, focal lesion, or hydronephrosis. Bladder is unremarkable. Stomach/Bowel: The appendix is abnormally dilated and contain numerous appendicoliths. It  measures up to 1.8 cm. A mild degree of inflammation is present in the fat around the appendiceal tip. Although this is not the typical intensely inflamed appearance of acute appendicitis, it is quite abnormal and this appendix did appear normal on 08/11/2015. No evidence of small bowel obstruction. No other inflammatory segments of bowel. Small bowel is unremarkable, with no evidence of Crohn's exacerbation. Colon is remarkable only for mild uncomplicated diverticulosis. Vascular/Lymphatic: The abdominal aorta is normal in caliber with mild atherosclerotic calcification. No adenopathy in the abdomen or pelvis. Reproductive: Hysterectomy.  No adnexal abnormalities. Other: No ascites. Musculoskeletal: Prior lumbar posterior decompression with instrumented fusion. No significant skeletal lesions. IMPRESSION: Atypical appendicitis: abnormal dilatation of the appendix with numerous appendicoliths but with only a relatively mild degree of inflammation around the appendiceal tip. It is possible that there also is a component of more chronic appendiceal dilatation/mucocele, but this is a dramatic change from 08/11/2015 when the appendix appeared entirely normal. No other acute findings are evident. These results were called by telephone at the time of interpretation on 07/21/2016 at 2:42 am to Dr. IRolland Porter, who verbally acknowledged these results. Electronically Signed   By: DAndreas NewportM.D.   On: 07/21/2016 02:59   Dg Abd 2 Views  Result Date: 07/21/2016 CLINICAL DATA:  Worsening abdominal pain today. Nausea and diarrhea. EXAM: ABDOMEN - 2 VIEW COMPARISON:  CT 08/11/2015 FINDINGS: Air-filled nondilated small bowel loops in the central left abdomen  have amorphous features. Air-fluid level is noted in the stomach. There is no bowel dilatation to suggest obstruction. Small volume of colonic stool. No radiopaque calculi. There are pelvic phleboliths. Postsurgical change in the lumbar spine. IMPRESSION: Air-filled  nondilated small bowel in the central left abdomen with amorphous features, can be seen with small bowel inflammation. No evidence of obstruction. Electronically Signed   By: Jeb Levering M.D.   On: 07/21/2016 00:06   Dg Chest 2 View  Result Date: 07/21/2016 CLINICAL DATA:  Appendicitis EXAM: CHEST  2 VIEW COMPARISON:  12/25/2014 FINDINGS: Elevated left hemidiaphragm, unchanged. No consolidation. No effusions. Hilar and mediastinal contours are unchanged. IMPRESSION: No acute cardiopulmonary disease. Electronically Signed   By: Andreas Newport M.D.   On: 07/21/2016 04:32        Procedures Procedures (including critical care time)  Medications Ordered in ED Medications  iopamidol (ISOVUE-300) 61 % injection (not administered)  ondansetron (ZOFRAN) injection 4 mg (not administered)  fentaNYL (SUBLIMAZE) injection 50 mcg (50 mcg Intravenous Given 07/21/16 0454)  sodium chloride 0.9 % bolus 1,000 mL (0 mLs Intravenous Stopped 07/21/16 0018)  sodium chloride 0.9 % bolus 500 mL (0 mLs Intravenous Stopped 07/21/16 0018)  fentaNYL (SUBLIMAZE) injection 50 mcg (50 mcg Intravenous Given 07/20/16 2335)  ondansetron (ZOFRAN) injection 4 mg (4 mg Intravenous Given 07/20/16 2335)  fentaNYL (SUBLIMAZE) injection 50 mcg (50 mcg Intravenous Given 07/21/16 0102)  ondansetron (ZOFRAN) injection 4 mg (4 mg Intravenous Given 07/21/16 0102)  iopamidol (ISOVUE-300) 61 % injection 100 mL (100 mLs Intravenous Contrast Given 07/21/16 0213)  fentaNYL (SUBLIMAZE) injection 50 mcg (50 mcg Intravenous Given 07/21/16 0244)  Ampicillin-Sulbactam (UNASYN) 3 g in sodium chloride 0.9 % 100 mL IVPB (0 g Intravenous Stopped 07/21/16 0401)  ondansetron (ZOFRAN) injection 4 mg (4 mg Intravenous Given 07/21/16 0252)     Initial Impression / Assessment and Plan / ED Course  I have reviewed the triage vital signs and the nursing notes.  Pertinent labs & imaging results that were available during my care of the patient  were reviewed by me and considered in my medical decision making (see chart for details).   COORDINATION OF CARE: 11:15 PM Discussed treatment plan with pt at bedside which includes lab work including CBC with differential and DG Abdomen and pt agreed to plan.  Patient was given IV fluids and IV pain and nausea medication.  Clinical Course as of Jul 22 251  Nancy Fetter Jul 21, 2016  0021 Discussed test results with pt. States her pain was better but is coming back. Discussed need for CT Scan and pt was agreeable with plan of care  [MG]    Clinical Course User Index [MG] Gwenlyn Fudge   2:43 AM radiologist called her CT result.  2:46 AM discussed test results with patient. She is in the bathroom retching. On reexam she still has some tenderness in the left upper quadrant but now she has some tenderness in the right lower quadrant. She states her pain is worse in the left upper quadrant.  03:25 AM Dr Arnoldo Morale, surgery, admit to med-surg, keep NPO, asks for pre-op EKG and CXR. He will see this morning to discuss treatment with patient .   Final Clinical Impressions(s) / ED Diagnoses   Final diagnoses:  Acute appendicitis, unspecified acute appendicitis type    Plan admission  Rolland Porter, MD, FACEP   I personally performed the services described in this documentation, which was scribed in my presence. The recorded information has been  reviewed and considered.  Rolland Porter, MD, Barbette Or, MD 07/21/16 [phone]

## 2016-07-20 NOTE — ED Notes (Signed)
Pt transported to xray 

## 2016-07-20 NOTE — ED Triage Notes (Signed)
Pt presents with c/o constant abd pain since approx 1300 today. States was intermittent this am but became constant at that time. Also endorses some diarrhea yesterday and nausea today with minimal vomiting. States same is watery in nature.

## 2016-07-21 ENCOUNTER — Encounter (HOSPITAL_COMMUNITY)
Admission: EM | Disposition: A | Discharge status: Home / Self Care | Payer: Self-pay | Source: Home / Self Care | Attending: General Surgery

## 2016-07-21 ENCOUNTER — Encounter (HOSPITAL_COMMUNITY): Payer: Self-pay | Admitting: Anesthesiology

## 2016-07-21 ENCOUNTER — Observation Stay (HOSPITAL_COMMUNITY): Payer: Medicare HMO | Admitting: Anesthesiology

## 2016-07-21 ENCOUNTER — Emergency Department (HOSPITAL_COMMUNITY): Payer: Medicare HMO

## 2016-07-21 ENCOUNTER — Observation Stay (HOSPITAL_COMMUNITY): Payer: Medicare HMO

## 2016-07-21 DIAGNOSIS — K3580 Unspecified acute appendicitis: Secondary | ICD-10-CM | POA: Diagnosis not present

## 2016-07-21 DIAGNOSIS — K37 Unspecified appendicitis: Secondary | ICD-10-CM | POA: Diagnosis present

## 2016-07-21 HISTORY — PX: LAPAROSCOPIC APPENDECTOMY: SHX408

## 2016-07-21 LAB — URINALYSIS, ROUTINE W REFLEX MICROSCOPIC
BACTERIA UA: NONE SEEN
BILIRUBIN URINE: NEGATIVE
Glucose, UA: 50 mg/dL — AB
HGB URINE DIPSTICK: NEGATIVE
KETONES UR: NEGATIVE mg/dL
Nitrite: NEGATIVE
PROTEIN: 100 mg/dL — AB
SPECIFIC GRAVITY, URINE: 1.02 (ref 1.005–1.030)
pH: 6 (ref 5.0–8.0)

## 2016-07-21 LAB — SURGICAL PCR SCREEN
MRSA, PCR: NEGATIVE
Staphylococcus aureus: NEGATIVE

## 2016-07-21 LAB — GLUCOSE, CAPILLARY
GLUCOSE-CAPILLARY: 238 mg/dL — AB (ref 65–99)
GLUCOSE-CAPILLARY: 234 mg/dL — AB (ref 65–99)
GLUCOSE-CAPILLARY: 256 mg/dL — AB (ref 65–99)
GLUCOSE-CAPILLARY: 211 mg/dL — AB (ref 65–99)

## 2016-07-21 SURGERY — APPENDECTOMY, LAPAROSCOPIC
Anesthesia: General | Site: Abdomen

## 2016-07-21 MED ORDER — IOPAMIDOL (ISOVUE-300) INJECTION 61%
100.0000 mL | Freq: Once | INTRAVENOUS | Status: AC | PRN
  Administered 2016-07-21: 100 mL via INTRAVENOUS

## 2016-07-21 MED ORDER — LISINOPRIL-HYDROCHLOROTHIAZIDE 20-12.5 MG PO TABS: 1.0000 | ORAL_TABLET | Freq: Every day | ORAL | Status: DC

## 2016-07-21 MED ORDER — EPHEDRINE SULFATE 50 MG/ML IJ SOLN
INTRAMUSCULAR | Status: AC
  Filled 2016-07-21: qty 1

## 2016-07-21 MED ORDER — SODIUM CHLORIDE 0.9 % IV SOLN
INTRAVENOUS | Status: DC
  Administered 2016-07-21: 14:00:00 via INTRAVENOUS

## 2016-07-21 MED ORDER — PIPERACILLIN-TAZOBACTAM 3.375 G IVPB
3.3750 g | Freq: Three times a day (TID) | INTRAVENOUS | Status: DC
  Administered 2016-07-21 – 2016-07-25 (×12): 3.375 g via INTRAVENOUS
  Filled 2016-07-21 (×12): qty 50

## 2016-07-21 MED ORDER — DIPHENHYDRAMINE HCL 50 MG/ML IJ SOLN: 12.5000 mg | Freq: Four times a day (QID) | INTRAMUSCULAR | Status: DC | PRN

## 2016-07-21 MED ORDER — ACETAMINOPHEN 650 MG RE SUPP: 650.0000 mg | Freq: Four times a day (QID) | RECTAL | Status: DC | PRN

## 2016-07-21 MED ORDER — FENTANYL CITRATE (PF) 100 MCG/2ML IJ SOLN
50.0000 ug | Freq: Once | INTRAMUSCULAR | Status: AC
  Administered 2016-07-21: 50 ug via INTRAVENOUS
  Filled 2016-07-21: qty 2

## 2016-07-21 MED ORDER — ROCURONIUM BROMIDE 50 MG/5ML IV SOLN
INTRAVENOUS | Status: AC
Start: 2016-07-21 — End: 2016-07-21
  Filled 2016-07-21: qty 1

## 2016-07-21 MED ORDER — BUPIVACAINE HCL (PF) 0.5 % IJ SOLN
INTRAMUSCULAR | Status: AC
  Filled 2016-07-21: qty 30

## 2016-07-21 MED ORDER — HYDROMORPHONE HCL 1 MG/ML IJ SOLN
0.5000 mg | INTRAMUSCULAR | Status: DC | PRN
  Administered 2016-07-21 – 2016-07-25 (×5): 0.5 mg via INTRAVENOUS
  Filled 2016-07-21 (×5): qty 1

## 2016-07-21 MED ORDER — LIDOCAINE HCL (PF) 1 % IJ SOLN
INTRAMUSCULAR | Status: AC
  Filled 2016-07-21: qty 5

## 2016-07-21 MED ORDER — SUCCINYLCHOLINE CHLORIDE 20 MG/ML IJ SOLN
INTRAMUSCULAR | Status: AC
  Filled 2016-07-21: qty 1

## 2016-07-21 MED ORDER — LACTATED RINGERS IV SOLN: INTRAVENOUS | Status: DC | PRN

## 2016-07-21 MED ORDER — CHLORHEXIDINE GLUCONATE CLOTH 2 % EX PADS
6.0000 | MEDICATED_PAD | Freq: Once | CUTANEOUS | Status: AC
  Administered 2016-07-21: 6 via TOPICAL

## 2016-07-21 MED ORDER — LIDOCAINE HCL (CARDIAC) 20 MG/ML IV SOLN
INTRAVENOUS | Status: DC | PRN
  Administered 2016-07-21: 25 mg via INTRAVENOUS

## 2016-07-21 MED ORDER — PROPOFOL 10 MG/ML IV BOLUS
INTRAVENOUS | Status: DC | PRN
  Administered 2016-07-21: 100 mg via INTRAVENOUS

## 2016-07-21 MED ORDER — INSULIN ASPART 100 UNIT/ML ~~LOC~~ SOLN
0.0000 [IU] | Freq: Three times a day (TID) | SUBCUTANEOUS | Status: DC
  Administered 2016-07-21: 8 [IU] via SUBCUTANEOUS
  Administered 2016-07-22: 5 [IU] via SUBCUTANEOUS
  Administered 2016-07-22: 8 [IU] via SUBCUTANEOUS
  Administered 2016-07-23: 5 [IU] via SUBCUTANEOUS
  Administered 2016-07-23: 3 [IU] via SUBCUTANEOUS
  Administered 2016-07-24: 2 [IU] via SUBCUTANEOUS
  Administered 2016-07-25: 3 [IU] via SUBCUTANEOUS

## 2016-07-21 MED ORDER — DICYCLOMINE HCL 10 MG PO CAPS
10.0000 mg | ORAL_CAPSULE | Freq: Three times a day (TID) | ORAL | Status: DC
  Administered 2016-07-21 – 2016-07-25 (×15): 10 mg via ORAL
  Filled 2016-07-21 (×15): qty 1

## 2016-07-21 MED ORDER — ONDANSETRON HCL 4 MG/2ML IJ SOLN: 4.0000 mg | Freq: Three times a day (TID) | INTRAMUSCULAR | Status: DC | PRN

## 2016-07-21 MED ORDER — POVIDONE-IODINE 10 % OINT PACKET
TOPICAL_OINTMENT | CUTANEOUS | Status: DC | PRN
  Administered 2016-07-21: 1 via TOPICAL

## 2016-07-21 MED ORDER — GLYCOPYRROLATE 0.2 MG/ML IJ SOLN
INTRAMUSCULAR | Status: AC
  Filled 2016-07-21: qty 3

## 2016-07-21 MED ORDER — ROCURONIUM BROMIDE 100 MG/10ML IV SOLN
INTRAVENOUS | Status: DC | PRN
  Administered 2016-07-21: 15 mg via INTRAVENOUS
  Administered 2016-07-21: 10 mg via INTRAVENOUS

## 2016-07-21 MED ORDER — POVIDONE-IODINE 10 % EX OINT
TOPICAL_OINTMENT | CUTANEOUS | Status: AC
  Filled 2016-07-21: qty 1

## 2016-07-21 MED ORDER — ONDANSETRON HCL 4 MG/2ML IJ SOLN: 4.0000 mg | Freq: Four times a day (QID) | INTRAMUSCULAR | Status: DC | PRN

## 2016-07-21 MED ORDER — LISINOPRIL 10 MG PO TABS
20.0000 mg | ORAL_TABLET | Freq: Every day | ORAL | Status: DC
  Administered 2016-07-21 – 2016-07-25 (×4): 20 mg via ORAL
  Filled 2016-07-21 (×5): qty 2

## 2016-07-21 MED ORDER — DIPHENHYDRAMINE HCL 12.5 MG/5ML PO ELIX: 12.5000 mg | ORAL_SOLUTION | Freq: Four times a day (QID) | ORAL | Status: DC | PRN

## 2016-07-21 MED ORDER — SIMETHICONE 80 MG PO CHEW: 40.0000 mg | CHEWABLE_TABLET | Freq: Four times a day (QID) | ORAL | Status: DC | PRN

## 2016-07-21 MED ORDER — FENTANYL CITRATE (PF) 250 MCG/5ML IJ SOLN
INTRAMUSCULAR | Status: AC
  Filled 2016-07-21: qty 5

## 2016-07-21 MED ORDER — LACTATED RINGERS IV SOLN
INTRAVENOUS | Status: DC | PRN
  Administered 2016-07-21: 12:00:00 via INTRAVENOUS

## 2016-07-21 MED ORDER — PHENYLEPHRINE HCL 10 MG/ML IJ SOLN
INTRAMUSCULAR | Status: DC | PRN
  Administered 2016-07-21 (×2): 40 ug via INTRAVENOUS

## 2016-07-21 MED ORDER — ALBUTEROL SULFATE HFA 108 (90 BASE) MCG/ACT IN AERS
INHALATION_SPRAY | RESPIRATORY_TRACT | Status: DC | PRN
  Administered 2016-07-21: 4 via RESPIRATORY_TRACT

## 2016-07-21 MED ORDER — ONDANSETRON HCL 4 MG/2ML IJ SOLN
4.0000 mg | Freq: Once | INTRAMUSCULAR | Status: AC
  Administered 2016-07-21: 4 mg via INTRAVENOUS
  Filled 2016-07-21: qty 2

## 2016-07-21 MED ORDER — ENOXAPARIN SODIUM 40 MG/0.4ML ~~LOC~~ SOLN
40.0000 mg | SUBCUTANEOUS | Status: DC
Start: 2016-07-22 — End: 2016-07-25
  Administered 2016-07-22 – 2016-07-25 (×4): 40 mg via SUBCUTANEOUS
  Filled 2016-07-21 (×4): qty 0.4

## 2016-07-21 MED ORDER — MIDAZOLAM HCL 5 MG/5ML IJ SOLN
INTRAMUSCULAR | Status: DC | PRN
  Administered 2016-07-21 (×2): 1 mg via INTRAVENOUS

## 2016-07-21 MED ORDER — HYDROCHLOROTHIAZIDE 12.5 MG PO CAPS
12.5000 mg | ORAL_CAPSULE | Freq: Every day | ORAL | Status: DC
  Administered 2016-07-21 – 2016-07-25 (×5): 12.5 mg via ORAL
  Filled 2016-07-21 (×5): qty 1

## 2016-07-21 MED ORDER — IOPAMIDOL (ISOVUE-300) INJECTION 61%
INTRAVENOUS | Status: AC
Start: 2016-07-21 — End: 2016-07-21
  Filled 2016-07-21: qty 50

## 2016-07-21 MED ORDER — SODIUM CHLORIDE 0.9 % IJ SOLN
INTRAMUSCULAR | Status: AC
  Filled 2016-07-21: qty 10

## 2016-07-21 MED ORDER — BUPIVACAINE HCL (PF) 0.5 % IJ SOLN
INTRAMUSCULAR | Status: DC | PRN
  Administered 2016-07-21: 10 mL

## 2016-07-21 MED ORDER — SODIUM CHLORIDE 0.9 % IR SOLN
Status: DC | PRN
  Administered 2016-07-21: 1000 mL

## 2016-07-21 MED ORDER — FENTANYL CITRATE (PF) 100 MCG/2ML IJ SOLN
INTRAMUSCULAR | Status: DC | PRN
  Administered 2016-07-21 (×2): 25 ug via INTRAVENOUS
  Administered 2016-07-21 (×2): 50 ug via INTRAVENOUS

## 2016-07-21 MED ORDER — NEOSTIGMINE METHYLSULFATE 10 MG/10ML IV SOLN
INTRAVENOUS | Status: DC | PRN
  Administered 2016-07-21: 4 mg via INTRAVENOUS

## 2016-07-21 MED ORDER — AMPICILLIN-SULBACTAM SODIUM 3 (2-1) G IJ SOLR
3.0000 g | Freq: Once | INTRAMUSCULAR | Status: AC
  Administered 2016-07-21: 3 g via INTRAVENOUS
  Filled 2016-07-21: qty 3

## 2016-07-21 MED ORDER — SODIUM CHLORIDE 0.9 % IV SOLN
3.0000 g | Freq: Once | INTRAVENOUS | Status: AC
  Administered 2016-07-21: 3 g via INTRAVENOUS
  Filled 2016-07-21: qty 3

## 2016-07-21 MED ORDER — INSULIN ASPART 100 UNIT/ML ~~LOC~~ SOLN
3.0000 [IU] | Freq: Once | SUBCUTANEOUS | Status: AC
  Administered 2016-07-21: 3 [IU] via SUBCUTANEOUS

## 2016-07-21 MED ORDER — FENTANYL CITRATE (PF) 100 MCG/2ML IJ SOLN
50.0000 ug | INTRAMUSCULAR | Status: DC | PRN
  Administered 2016-07-21 (×3): 50 ug via INTRAVENOUS
  Filled 2016-07-21 (×3): qty 2

## 2016-07-21 MED ORDER — PROPOFOL 10 MG/ML IV BOLUS
INTRAVENOUS | Status: AC
Start: 2016-07-21 — End: 2016-07-21
  Filled 2016-07-21: qty 40

## 2016-07-21 MED ORDER — LORAZEPAM 2 MG/ML IJ SOLN: 0.5000 mg | Freq: Four times a day (QID) | INTRAMUSCULAR | Status: DC | PRN

## 2016-07-21 MED ORDER — BALSALAZIDE DISODIUM 750 MG PO CAPS
2250.0000 mg | ORAL_CAPSULE | Freq: Three times a day (TID) | ORAL | Status: DC
  Administered 2016-07-21 – 2016-07-25 (×11): 2250 mg via ORAL

## 2016-07-21 MED ORDER — MIDAZOLAM HCL 2 MG/2ML IJ SOLN
INTRAMUSCULAR | Status: AC
  Filled 2016-07-21: qty 2

## 2016-07-21 MED ORDER — ACETAMINOPHEN 325 MG PO TABS: 650.0000 mg | ORAL_TABLET | Freq: Four times a day (QID) | ORAL | Status: DC | PRN

## 2016-07-21 MED ORDER — HYDROCODONE-ACETAMINOPHEN 5-325 MG PO TABS
1.0000 | ORAL_TABLET | ORAL | Status: DC | PRN
  Administered 2016-07-22 – 2016-07-25 (×13): 1 via ORAL
  Filled 2016-07-21 (×15): qty 1

## 2016-07-21 MED ORDER — GLIPIZIDE 5 MG PO TABS
10.0000 mg | ORAL_TABLET | Freq: Every day | ORAL | Status: DC
Start: 2016-07-22 — End: 2016-07-25
  Administered 2016-07-22 – 2016-07-25 (×4): 10 mg via ORAL
  Filled 2016-07-21 (×4): qty 2

## 2016-07-21 MED ORDER — SUCCINYLCHOLINE CHLORIDE 20 MG/ML IJ SOLN
INTRAMUSCULAR | Status: DC | PRN
  Administered 2016-07-21: 100 mg via INTRAVENOUS

## 2016-07-21 MED ORDER — ONDANSETRON 4 MG PO TBDP: 4.0000 mg | ORAL_TABLET | Freq: Four times a day (QID) | ORAL | Status: DC | PRN

## 2016-07-21 MED ORDER — ONDANSETRON HCL 4 MG/2ML IJ SOLN
INTRAMUSCULAR | Status: AC
  Filled 2016-07-21: qty 2

## 2016-07-21 MED ORDER — GLYCOPYRROLATE 0.2 MG/ML IJ SOLN
INTRAMUSCULAR | Status: DC | PRN
  Administered 2016-07-21: 0.6 mg via INTRAVENOUS
  Administered 2016-07-21: 0.2 mg via INTRAVENOUS

## 2016-07-21 MED ORDER — CHLORHEXIDINE GLUCONATE CLOTH 2 % EX PADS: 6.0000 | MEDICATED_PAD | Freq: Once | CUTANEOUS | Status: DC

## 2016-07-21 MED ORDER — ARTIFICIAL TEARS OP OINT
TOPICAL_OINTMENT | OPHTHALMIC | Status: AC
  Filled 2016-07-21: qty 3.5

## 2016-07-21 MED ORDER — NEOSTIGMINE METHYLSULFATE 10 MG/10ML IV SOLN
INTRAVENOUS | Status: AC
  Filled 2016-07-21: qty 2

## 2016-07-21 MED ORDER — SODIUM CHLORIDE 0.9 % IV SOLN
INTRAVENOUS | Status: DC
  Administered 2016-07-21: 08:00:00 via INTRAVENOUS

## 2016-07-21 SURGICAL SUPPLY — 48 items
APL SRG 38 LTWT LNG FL B (MISCELLANEOUS) ×1
APPLICATOR ARISTA FLEXITIP XL (MISCELLANEOUS) ×1 IMPLANT
BAG HAMPER (MISCELLANEOUS) ×2 IMPLANT
BAG SPEC RTRVL LRG 6X4 10 (ENDOMECHANICALS) ×1
CHLORAPREP W/TINT 26ML (MISCELLANEOUS) ×2 IMPLANT
CLOTH BEACON ORANGE TIMEOUT ST (SAFETY) ×2 IMPLANT
COVER LIGHT HANDLE STERIS (MISCELLANEOUS) ×4 IMPLANT
CUTTER FLEX LINEAR 45M (STAPLE) ×2 IMPLANT
DECANTER SPIKE VIAL GLASS SM (MISCELLANEOUS) ×2 IMPLANT
ELECT REM PT RETURN 9FT ADLT (ELECTROSURGICAL) ×2
ELECTRODE REM PT RTRN 9FT ADLT (ELECTROSURGICAL) ×1 IMPLANT
EVACUATOR SMOKE 8.L (FILTER) ×2 IMPLANT
FORMALIN 10 PREFIL 120ML (MISCELLANEOUS) ×2 IMPLANT
GLOVE BIOGEL PI IND STRL 7.0 (GLOVE) ×2 IMPLANT
GLOVE BIOGEL PI INDICATOR 7.0 (GLOVE) ×2
GLOVE SURG SS PI 7.5 STRL IVOR (GLOVE) ×2 IMPLANT
GOWN STRL REUS W/ TWL XL LVL3 (GOWN DISPOSABLE) ×1 IMPLANT
GOWN STRL REUS W/TWL LRG LVL3 (GOWN DISPOSABLE) ×2 IMPLANT
GOWN STRL REUS W/TWL XL LVL3 (GOWN DISPOSABLE) ×2
HEMOSTAT ARISTA ABSORB 1G (MISCELLANEOUS) ×2 IMPLANT
INST SET LAPROSCOPIC AP (KITS) ×2 IMPLANT
IV NS IRRIG 3000ML ARTHROMATIC (IV SOLUTION) IMPLANT
KIT ROOM TURNOVER APOR (KITS) ×2 IMPLANT
MANIFOLD NEPTUNE II (INSTRUMENTS) ×2 IMPLANT
NDL INSUFFLATION 14GA 120MM (NEEDLE) ×1 IMPLANT
NEEDLE INSUFFLATION 14GA 120MM (NEEDLE) ×2 IMPLANT
NS IRRIG 1000ML POUR BTL (IV SOLUTION) ×2 IMPLANT
PACK LAP CHOLE LZT030E (CUSTOM PROCEDURE TRAY) ×2 IMPLANT
PAD ARMBOARD 7.5X6 YLW CONV (MISCELLANEOUS) ×2 IMPLANT
PENCIL HANDSWITCHING (ELECTRODE) ×1 IMPLANT
POUCH SPECIMEN RETRIEVAL 10MM (ENDOMECHANICALS) ×2 IMPLANT
RELOAD 45 VASCULAR/THIN (ENDOMECHANICALS) IMPLANT
RELOAD STAPLE 45 2.5 WHT GRN (ENDOMECHANICALS) IMPLANT
RELOAD STAPLE 45 3.5 BLU ETS (ENDOMECHANICALS) IMPLANT
RELOAD STAPLE TA45 3.5 REG BLU (ENDOMECHANICALS) ×2 IMPLANT
SET BASIN LINEN APH (SET/KITS/TRAYS/PACK) ×2 IMPLANT
SET TUBE IRRIG SUCTION NO TIP (IRRIGATION / IRRIGATOR) IMPLANT
SHEARS HARMONIC ACE PLUS 36CM (ENDOMECHANICALS) ×2 IMPLANT
SPONGE GAUZE 2X2 8PLY STRL LF (GAUZE/BANDAGES/DRESSINGS) ×6 IMPLANT
STAPLER VISISTAT (STAPLE) ×2 IMPLANT
SUT VICRYL 0 UR6 27IN ABS (SUTURE) ×2 IMPLANT
TRAY FOLEY CATH SILVER 16FR (SET/KITS/TRAYS/PACK) ×2 IMPLANT
TROCAR ENDO BLADELESS 11MM (ENDOMECHANICALS) ×2 IMPLANT
TROCAR ENDO BLADELESS 12MM (ENDOMECHANICALS) ×2 IMPLANT
TROCAR XCEL NON-BLD 5MMX100MML (ENDOMECHANICALS) ×2 IMPLANT
TUBING INSUFFLATION (TUBING) ×2 IMPLANT
WARMER LAPAROSCOPE (MISCELLANEOUS) ×2 IMPLANT
YANKAUER SUCT 12FT TUBE ARGYLE (SUCTIONS) ×2 IMPLANT

## 2016-07-21 NOTE — Addendum Note (Signed)
Addendum  created 07/21/16 1224 by Mickel Baas, CRNA   Anesthesia Intra Meds edited

## 2016-07-21 NOTE — Transfer of Care (Signed)
Immediate Anesthesia Transfer of Care Note  Patient: DAISJA KESSINGER  Procedure(s) Performed: Procedure(s): APPENDECTOMY LAPAROSCOPIC (N/A)  Patient Location: PACU  Anesthesia Type:General  Level of Consciousness: awake, oriented and patient cooperative  Airway & Oxygen Therapy: Patient Spontanous Breathing and Patient connected to face mask oxygen  Post-op Assessment: Report given to RN and Post -op Vital signs reviewed and stable  Post vital signs: Reviewed and stable  Last Vitals:  Vitals:   07/21/16 0310 07/21/16 0618  BP: 151/81 (!) 144/72  Pulse: 93 89  Resp: 17   Temp:  37 C    Last Pain:  Vitals:   07/21/16 0903  TempSrc:   PainSc: 4       Patients Stated Pain Goal: 2 (49/32/41 9914)  Complications: No apparent anesthesia complications

## 2016-07-21 NOTE — Anesthesia Procedure Notes (Signed)
Procedure Name: Intubation Date/Time: 07/21/2016 10:25 AM Performed by: Andree Elk, Yana Schorr A Pre-anesthesia Checklist: Patient identified, Patient being monitored, Timeout performed, Emergency Drugs available and Suction available Patient Re-evaluated:Patient Re-evaluated prior to inductionOxygen Delivery Method: Circle System Utilized Preoxygenation: Pre-oxygenation with 100% oxygen Intubation Type: IV induction, Rapid sequence and Cricoid Pressure applied Laryngoscope Size: Miller and 3 Grade View: Grade I Tube type: Oral Tube size: 7.0 mm Number of attempts: 1 Airway Equipment and Method: Stylet Placement Confirmation: ETT inserted through vocal cords under direct vision,  positive ETCO2 and breath sounds checked- equal and bilateral Secured at: 21 cm Tube secured with: Tape Dental Injury: Teeth and Oropharynx as per pre-operative assessment

## 2016-07-21 NOTE — Progress Notes (Signed)
Patient CBG 238, no order for coverage. Dr. Arnoldo Morale notified. New order for Novolog 3 units x1 dose.

## 2016-07-21 NOTE — Anesthesia Preprocedure Evaluation (Addendum)
Anesthesia Evaluation  Patient identified by MRN, date of birth, ID band Patient awake    Reviewed: Allergy & Precautions, NPO status , Patient's Chart, lab work & pertinent test results, reviewed documented beta blocker date and time   Airway Mallampati: II  TM Distance: >3 FB Neck ROM: Full    Dental no notable dental hx. (+) Teeth Intact   Pulmonary    breath sounds clear to auscultation       Cardiovascular hypertension,  Rhythm:Regular Rate:Normal     Neuro/Psych  Neuromuscular disease CVA    GI/Hepatic Crohn's disease, IBS   Endo/Other  diabetes, Insulin Dependent, Oral Hypoglycemic Agents  Renal/GU      Musculoskeletal   Abdominal (+)  Abdomen: tender.    Peds  Hematology  (+) anemia ,   Anesthesia Other Findings   Reproductive/Obstetrics negative OB ROS                             Anesthesia Physical Anesthesia Plan  ASA: III and emergent  Anesthesia Plan: General   Post-op Pain Management:    Induction: Rapid sequence, Cricoid pressure planned and Intravenous  Airway Management Planned: Oral ETT  Additional Equipment: None  Intra-op Plan:   Post-operative Plan: Extubation in OR  Informed Consent: I have reviewed the patients History and Physical, chart, labs and discussed the procedure including the risks, benefits and alternatives for the proposed anesthesia with the patient or authorized representative who has indicated his/her understanding and acceptance.   Dental advisory given  Plan Discussed with: Surgeon and Anesthesiologist  Anesthesia Plan Comments:       Anesthesia Quick Evaluation

## 2016-07-21 NOTE — H&P (Signed)
Felicia Pugh is an 70 y.o. female.   Chief Complaint: Right lower quadrant abdominal pain HPI: Patient is a 70 year old black female who presented to the emergency room with a less than 24-hour history of worsening upper abdominal pain. She does have some nausea, but no vomiting. No fevers were noted. CT scan the abdomen revealed acute appendicitis.  Past Medical History:  Diagnosis Date  . 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 Cataract   . Complication of anesthesia    difficulty breathing after waking up from anesthesia  . Crohn's disease ([place])   . Diabetes mellitus without complication ([place])   . Diabetic neuropathy ([place])   . Hyperlipidemia   . Hypertension   . IBS (irritable bowel syndrome)   . Stroke [facility]) 2011  . Syncope and collapse   . Thyroid nodule   . Ulcer [facility])     Past Surgical History:  Procedure Laterality Date  . ABDOMINAL HYSTERECTOMY     partial  . BACK SURGERY     x3 (Dr Loyola Mast, Dr Patrice Paradise)  . BREAST SURGERY Right 2002   cyst removed  . COLONOSCOPY  10/102006   RMR: normal  . COLONOSCOPY  09/06/2009   RMR: Suboptimal prep on the right side. Ulcers noted at cecum and in the descending colon. Terminal ileum could not be intubated. Ounces with chronic active colitis, question Crohn's  . COLONOSCOPY N/A 03/27/2015   Dr. Gala Romney: abnormal cecum and IC valve most consistent with IBD. TI intubated and appeared normal. Likely Crohn's disease. Patient denies NSAID use. No improvement with Lialda samples at time of initial diagnosis  . ESOPHAGOGASTRODUODENOSCOPY    . Small bowel capsule endoscopy  06/10/2005   RMR: single erosion versus AVM, distal ileum  . Small bowel capsule endoscopy  prior to 05/2005   [place]: reported erosions/ulcerations per medical record. actual report unavailable.    Family History  Problem Relation Age of Onset  . Hypertension Mother   . Stroke Mother   . Diabetes Father     w/ BKA   . Hypertension Father   . Cancer Sister   . Diabetes Sister     stomach  . Diabetes Brother   . Diabetes Sister   . Cancer Sister     breast  . Hypertension Sister   . Colon cancer Neg Hx   . Inflammatory bowel disease Neg Hx    Social History:  reports that she has never smoked. She has never used smokeless tobacco. She reports that she does not drink alcohol or use drugs.  Allergies:  Allergies  Allergen Reactions  . Metformin And Related     Severe diarrhea  . Asa [Aspirin]     GI Dr does not want her to take due to hx ulcers  . Nsaids     GI Dr does not want her to take due to hx of ulcers  . Other Diarrhea    Steroids    Medications Prior to Admission  Medication Sig Dispense Refill  . atorvastatin (LIPITOR) 40 MG tablet Take 1 tablet (40 mg total) by mouth daily. 90 tablet 1  . balsalazide (COLAZAL) 750 MG capsule TAKE THREE CAPSULES BY MOUTH THREE TIMES DAILY 270 capsule 3  . dicyclomine (BENTYL) 10 MG capsule TAKE ONE CAPSULE BY MOUTH 4 TIMES DAILY WITH MEALS AND AT BEDTIME AS NEEDED FOR  CRAMPS  AND  DIARRHEA 120 capsule 3  . glipiZIDE (GLUCOTROL)  10 MG tablet Take 1 tablet (10 mg total) by mouth daily. 90 tablet 1  . glucose blood (ONETOUCH VERIO) test strip Test bid. DX E11.9 100 each 5  . insulin NPH Human (NOVOLIN N RELION) 100 UNIT/ML injection Inject 26 units in the morning and then 15 units at night 10 mL 6  . Insulin Syringes, Disposable, U-100 0.5 ML MISC Give insulin twice daily as directed 100 each 1  . lisinopril-hydrochlorothiazide (PRINZIDE,ZESTORETIC) 20-12.5 MG tablet Take 1 tablet by mouth daily. 90 tablet 1  . Multiple Vitamin (MULTIVITAMIN) tablet Take 1 tablet by mouth daily.      Results for orders placed or performed during the hospital encounter of 07/20/16 (from the past 48 hour(s))  Comprehensive metabolic panel     Status: Abnormal   Collection Time: 07/20/16 11:10 PM  Result Value Ref Range   Sodium 139 135 - 145 mmol/L   Potassium  3.2 (L) 3.5 - 5.1 mmol/L   Chloride 103 101 - 111 mmol/L   CO2 27 22 - 32 mmol/L   Glucose, Bld 186 (H) 65 - 99 mg/dL   BUN 17 6 - 20 mg/dL   Creatinine, Ser 0.74 0.44 - 1.00 mg/dL   Calcium 9.2 8.9 - 10.3 mg/dL   Total Protein 7.7 6.5 - 8.1 g/dL   Albumin 4.2 3.5 - 5.0 g/dL   AST 21 15 - 41 U/L   ALT 18 14 - 54 U/L   Alkaline Phosphatase 124 38 - 126 U/L   Total Bilirubin 0.2 (L) 0.3 - 1.2 mg/dL   GFR calc non Af Amer >60 >60 mL/min   GFR calc Af Amer >60 >60 mL/min    Comment: (NOTE) The eGFR has been calculated using the CKD EPI equation. This calculation has not been validated in all clinical situations. eGFR's persistently <60 mL/min signify possible Chronic Kidney Disease.    Anion gap 9 5 - 15  Lipase, blood     Status: None   Collection Time: 07/20/16 11:10 PM  Result Value Ref Range   Lipase 49 11 - 51 U/L  CBC with Differential     Status: Abnormal   Collection Time: 07/20/16 11:10 PM  Result Value Ref Range   WBC 16.8 (H) 4.0 - 10.5 K/uL   RBC 4.07 3.87 - 5.11 MIL/uL   Hemoglobin 11.5 (L) 12.0 - 15.0 g/dL   HCT 36.6 36.0 - 46.0 %   MCV 89.9 78.0 - 100.0 fL   MCH 28.3 26.0 - 34.0 pg   MCHC 31.4 30.0 - 36.0 g/dL   RDW 13.9 11.5 - 15.5 %   Platelets 346 150 - 400 K/uL   Neutrophils Relative % 86 %   Neutro Abs 14.5 (H) 1.7 - 7.7 K/uL   Lymphocytes Relative 10 %   Lymphs Abs 1.6 0.7 - 4.0 K/uL   Monocytes Relative 4 %   Monocytes Absolute 0.6 0.1 - 1.0 K/uL   Eosinophils Relative 0 %   Eosinophils Absolute 0.1 0.0 - 0.7 K/uL   Basophils Relative 0 %   Basophils Absolute 0.0 0.0 - 0.1 K/uL  Urinalysis, Routine w reflex microscopic     Status: Abnormal   Collection Time: 07/21/16 12:17 AM  Result Value Ref Range   Color, Urine YELLOW YELLOW   APPearance HAZY (A) CLEAR   Specific Gravity, Urine 1.020 1.005 - 1.030   pH 6.0 5.0 - 8.0   Glucose, UA 50 (A) NEGATIVE mg/dL   Hgb urine dipstick NEGATIVE NEGATIVE  Bilirubin Urine NEGATIVE NEGATIVE   Ketones, ur  NEGATIVE NEGATIVE mg/dL   Protein, ur 100 (A) NEGATIVE mg/dL   Nitrite NEGATIVE NEGATIVE   Leukocytes, UA SMALL (A) NEGATIVE   RBC / HPF 0-5 0 - 5 RBC/hpf   WBC, UA 6-30 0 - 5 WBC/hpf   Bacteria, UA NONE SEEN NONE SEEN   Mucous PRESENT    Hyaline Casts, UA PRESENT   Glucose, capillary     Status: Abnormal   Collection Time: 07/21/16  7:39 AM  Result Value Ref Range   Glucose-Capillary 238 (H) 65 - 99 mg/dL   Comment 1 Notify RN    Comment 2 Document in Chart    Dg Chest 2 View  Result Date: 07/21/2016 CLINICAL DATA:  Appendicitis EXAM: CHEST  2 VIEW COMPARISON:  12/25/2014 FINDINGS: Elevated left hemidiaphragm, unchanged. No consolidation. No effusions. Hilar and mediastinal contours are unchanged. IMPRESSION: No acute cardiopulmonary disease. Electronically Signed   By: Andreas Newport M.D.   On: 07/21/2016 04:32   Ct Abdomen Pelvis W Contrast  Result Date: 07/21/2016 CLINICAL DATA:  History of Crohn disease. Gradually worsening epigastric abdominal pain since this morning. The pain became constant at 15:00 EXAM: CT ABDOMEN AND PELVIS WITH CONTRAST TECHNIQUE: Multidetector CT imaging of the abdomen and pelvis was performed using the standard protocol following bolus administration of intravenous contrast. CONTRAST:  155m ISOVUE-300 IOPAMIDOL (ISOVUE-300) INJECTION 61% COMPARISON:  08/11/2015 FINDINGS: Lower chest: Mild atelectatic appearing linear lung base opacities. No consolidation or effusion. Hepatobiliary: No focal liver abnormality is seen. No gallstones, gallbladder wall thickening, or biliary dilatation. Pancreas: Unremarkable. No pancreatic ductal dilatation or surrounding inflammatory changes. Spleen: Normal in size without focal abnormality. Adrenals/Urinary Tract: Adrenal glands are unremarkable. Kidneys are normal, without renal calculi, focal lesion, or hydronephrosis. Bladder is unremarkable. Stomach/Bowel: The appendix is abnormally dilated and contain numerous  appendicoliths. It measures up to 1.8 cm. A mild degree of inflammation is present in the fat around the appendiceal tip. Although this is not the typical intensely inflamed appearance of acute appendicitis, it is quite abnormal and this appendix did appear normal on 08/11/2015. No evidence of small bowel obstruction. No other inflammatory segments of bowel. Small bowel is unremarkable, with no evidence of Crohn's exacerbation. Colon is remarkable only for mild uncomplicated diverticulosis. Vascular/Lymphatic: The abdominal aorta is normal in caliber with mild atherosclerotic calcification. No adenopathy in the abdomen or pelvis. Reproductive: Hysterectomy.  No adnexal abnormalities. Other: No ascites. Musculoskeletal: Prior lumbar posterior decompression with instrumented fusion. No significant skeletal lesions. IMPRESSION: Atypical appendicitis: abnormal dilatation of the appendix with numerous appendicoliths but with only a relatively mild degree of inflammation around the appendiceal tip. It is possible that there also is a component of more chronic appendiceal dilatation/mucocele, but this is a dramatic change from 08/11/2015 when the appendix appeared entirely normal. No other acute findings are evident. These results were called by telephone at the time of interpretation on 07/21/2016 at 2:42 am to Dr. IRolland Porter, who verbally acknowledged these results. Electronically Signed   By: DAndreas NewportM.D.   On: 07/21/2016 02:59   Dg Abd 2 Views  Result Date: 07/21/2016 CLINICAL DATA:  Worsening abdominal pain today. Nausea and diarrhea. EXAM: ABDOMEN - 2 VIEW COMPARISON:  CT 08/11/2015 FINDINGS: Air-filled nondilated small bowel loops in the central left abdomen have amorphous features. Air-fluid level is noted in the stomach. There is no bowel dilatation to suggest obstruction. Small volume of colonic stool. No radiopaque calculi. There are  pelvic phleboliths. Postsurgical change in the lumbar spine.  IMPRESSION: Air-filled nondilated small bowel in the central left abdomen with amorphous features, can be seen with small bowel inflammation. No evidence of obstruction. Electronically Signed   By: Jeb Levering M.D.   On: 07/21/2016 00:06    Review of Systems  Constitutional: Positive for malaise/fatigue.  HENT: Negative.   Eyes: Negative.   Respiratory: Negative.   Cardiovascular: Negative.   Gastrointestinal: Positive for abdominal pain.  Genitourinary: Negative.   Musculoskeletal: Negative.   Skin: Negative.   All other systems reviewed and are negative.   Blood pressure (!) 144/72, pulse 89, temperature 98.6 F (37 C), temperature source Oral, resp. rate 17, height _0  (1.575 m), weight 85.5 kg (188 lb 7.9 oz), SpO2 96 %. Physical Exam  Constitutional: She is oriented to person, place, and time. She appears well-developed and well-nourished.  HENT:  Head: Normocephalic and atraumatic.  Neck: Normal range of motion. Neck supple.  Cardiovascular: Normal rate, regular rhythm and normal heart sounds.   Respiratory: Effort normal and breath sounds normal.  GI: Soft. She exhibits no distension. There is tenderness. There is no rebound.  Tender in the right lower quadrant to palpation. No rigidity noted.  Neurological: She is alert and oriented to person, place, and time.  Skin: Skin is warm and dry.     Assessment/Plan Impression: Acute appendicitis Plan: Patient be taken to the operating room for laparoscopic appendectomy. The risks and benefits of the procedure including bleeding, infection, and the possibility of an open procedure were fully explained to the patient, who gave informed consent.  Jamesetta So, MD 07/21/2016, 9:22 AM

## 2016-07-21 NOTE — Op Note (Signed)
Patient:  Felicia Pugh  DOB:  09-04-1945  MRN:  [id number]   Preop Diagnosis:  Acute appendicitis  Postop Diagnosis:  Same  Procedure:  Laparoscopic appendectomy  Surgeon:  Aviva Signs, M.D.  Anes:  Gen. endotracheal  Indications:  Patient is a 70 year old black female presents with a 24-hour history of worsening abdominal pain. CT scan the abdomen reveals acute appendicitis. The risks and benefits of the procedure including bleeding, infection, and the possibility of an open procedure were fully explained to the patient, who gave informed consent.  Procedure note:  The patient was placed the supine position. After induction of general endotracheal anesthesia, the abdomen was prepped and draped using the usual sterile technique with DuraPrep. Surgical site confirmation was performed.  A supraumbilical incision was made down to the fascia. A Veress needle was introduced into the abdominal cavity and confirmation of placement was done using the saline drop test. The abdomen was then insufflated to 16 mmHg pressure. An 11 mm trocar was introduced into the abdominal cavity under direct visualization without difficulty. The patient was placed in deeper Trendelenburg position and an additional 12 mm trocar was placed the suprapubic region and a 5 mm trocar was placed left lower quadrant region. The appendix was visualized and noted to be diffusely inflamed and mildly gangrenous to the base. There was no evidence of perforation. The mesoappendix was divided using the harmonic scalpel. A standard Endo GIA was placed across the base the appendix and fired. The appendix was then removed using an Endo Catch bag without difficulty. The staple line was inspected and noted to be within normal limits.  Arista was placed along the mesentery to control any raw surfaces. All fluid and air were then evacuated from the abdominal cavity prior to removal of the trochars.  All wounds were irrigated with normal  saline. All wounds were injected with 0.5% Sensorcaine. The supraumbilical fascia was reapproximated using an 0 Vicryl interrupted suture. All skin incisions were closed using staples. Betadine ointment and dry sterile dressings were applied.  All tape and needle counts were correct the end of the procedure. Patient was extubated in the operating room and transferred to PACU in stable condition.  Complications:  None  EBL:  Minimal  Specimen:  Appendix

## 2016-07-21 NOTE — Anesthesia Postprocedure Evaluation (Signed)
Anesthesia Post Note  Patient: Felicia Pugh  Procedure(s) Performed: Procedure(s) (LRB): APPENDECTOMY LAPAROSCOPIC (N/A)  Patient location during evaluation: PACU Anesthesia Type: General Level of consciousness: awake and alert and oriented Pain management: pain level controlled Vital Signs Assessment: post-procedure vital signs reviewed and stable Respiratory status: spontaneous breathing Cardiovascular status: stable Postop Assessment: no signs of nausea or vomiting Anesthetic complications: no    Last Vitals:  Vitals:   07/21/16 1200 07/21/16 1215  BP: (!) 141/72 138/69  Pulse: (!) 105 96  Resp: (!) 32 (!) 26  Temp: 37.3 C     Last Pain:  Vitals:   07/21/16 0903  TempSrc:   PainSc: 4                  ADAMS, AMY A

## 2016-07-22 ENCOUNTER — Encounter (HOSPITAL_COMMUNITY): Payer: Self-pay | Admitting: General Surgery

## 2016-07-22 ENCOUNTER — Observation Stay (HOSPITAL_COMMUNITY): Payer: Medicare HMO

## 2016-07-22 DIAGNOSIS — Z8673 Personal history of transient ischemic attack (TIA), and cerebral infarction without residual deficits: Secondary | ICD-10-CM | POA: Diagnosis not present

## 2016-07-22 DIAGNOSIS — K3580 Unspecified acute appendicitis: Secondary | ICD-10-CM | POA: Diagnosis not present

## 2016-07-22 DIAGNOSIS — E119 Type 2 diabetes mellitus without complications: Secondary | ICD-10-CM | POA: Diagnosis not present

## 2016-07-22 DIAGNOSIS — I10 Essential (primary) hypertension: Secondary | ICD-10-CM | POA: Diagnosis not present

## 2016-07-22 DIAGNOSIS — E876 Hypokalemia: Secondary | ICD-10-CM | POA: Diagnosis not present

## 2016-07-22 DIAGNOSIS — R0602 Shortness of breath: Secondary | ICD-10-CM | POA: Diagnosis not present

## 2016-07-22 DIAGNOSIS — K5090 Crohn's disease, unspecified, without complications: Secondary | ICD-10-CM | POA: Diagnosis not present

## 2016-07-22 DIAGNOSIS — E1152 Type 2 diabetes mellitus with diabetic peripheral angiopathy with gangrene: Secondary | ICD-10-CM | POA: Diagnosis not present

## 2016-07-22 DIAGNOSIS — R0902 Hypoxemia: Secondary | ICD-10-CM | POA: Diagnosis not present

## 2016-07-22 DIAGNOSIS — E1140 Type 2 diabetes mellitus with diabetic neuropathy, unspecified: Secondary | ICD-10-CM | POA: Diagnosis not present

## 2016-07-22 DIAGNOSIS — Z794 Long term (current) use of insulin: Secondary | ICD-10-CM | POA: Diagnosis not present

## 2016-07-22 LAB — CBC
HCT: 30.3 % — ABNORMAL LOW (ref 36.0–46.0)
Hemoglobin: 9.1 g/dL — ABNORMAL LOW (ref 12.0–15.0)
MCH: 27.2 pg (ref 26.0–34.0)
MCHC: 30 g/dL (ref 30.0–36.0)
MCV: 90.4 fL (ref 78.0–100.0)
PLATELETS: 290 10*3/uL (ref 150–400)
RBC: 3.35 MIL/uL — AB (ref 3.87–5.11)
RDW: 14.1 % (ref 11.5–15.5)
WBC: 16.1 10*3/uL — ABNORMAL HIGH (ref 4.0–10.5)

## 2016-07-22 LAB — GLUCOSE, CAPILLARY
GLUCOSE-CAPILLARY: 229 mg/dL — AB (ref 65–99)
Glucose-Capillary: 257 mg/dL — ABNORMAL HIGH (ref 65–99)
Glucose-Capillary: 217 mg/dL — ABNORMAL HIGH (ref 65–99)
Glucose-Capillary: 153 mg/dL — ABNORMAL HIGH (ref 65–99)

## 2016-07-22 LAB — BASIC METABOLIC PANEL
Anion gap: 7 (ref 5–15)
BUN: 19 mg/dL (ref 6–20)
CHLORIDE: 102 mmol/L (ref 101–111)
CO2: 27 mmol/L (ref 22–32)
CREATININE: 1.16 mg/dL — AB (ref 0.44–1.00)
Calcium: 8.1 mg/dL — ABNORMAL LOW (ref 8.9–10.3)
GFR calc Af Amer: 54 mL/min — ABNORMAL LOW (ref >60)
GFR calc non Af Amer: 47 mL/min — ABNORMAL LOW (ref >60)
GLUCOSE: 270 mg/dL — AB (ref 65–99)
Potassium: 3.3 mmol/L — ABNORMAL LOW (ref 3.5–5.1)
Sodium: 136 mmol/L (ref 135–145)

## 2016-07-22 LAB — BRAIN NATRIURETIC PEPTIDE: B Natriuretic Peptide: 84 pg/mL (ref 0.0–100.0)

## 2016-07-22 LAB — TROPONIN I: Troponin I: <0.03 ng/mL (ref <0.03)

## 2016-07-22 MED ORDER — HEMOSTATIC AGENTS (NO CHARGE) OPTIME
TOPICAL | Status: DC | PRN
  Administered 2016-07-22: 1 via TOPICAL

## 2016-07-22 MED ORDER — ALBUTEROL SULFATE (2.5 MG/3ML) 0.083% IN NEBU
2.5000 mg | INHALATION_SOLUTION | RESPIRATORY_TRACT | Status: DC | PRN
  Filled 2016-07-22: qty 3

## 2016-07-22 NOTE — Progress Notes (Signed)
Inpatient Diabetes Program Recommendations  AACE/ADA: New Consensus Statement on Inpatient Glycemic Control (2015)  Target Ranges:  Prepandial:   less than 140 mg/dL      Peak postprandial:   less than 180 mg/dL (1-2 hours)      Critically ill patients:  140 - 180 mg/dL   Results for EVANNA, WASHINTON (MRN [id number]) as of 07/22/2016 08:33  Ref. Range 07/21/2016 07:39 07/21/2016 11:58 07/21/2016 16:12 07/21/2016 21:04  Glucose-Capillary Latest Ref Range: 65 - 99 mg/dL 238 (H) 234 (H) 256 (H) 211 (H)   Results for NORI, WINEGAR (MRN [id number]) as of 07/22/2016 08:33  Ref. Range 07/22/2016 07:42  Glucose-Capillary Latest Ref Range: 65 - 99 mg/dL 257 (H)    Admit with: Acute Appendicitis  History: DM, CVA  Home DM Meds: Glipizide 10 mg daily       NPH Insulin- 26 units AM/ 15 units PM  Current Insulin Orders: Glipizide 10 mg daily      Novolog Moderate Correction Scale/ SSI (0-15 units) TID AC        MD- Please consider restarting 50% of pt's home dose of NPH Insulin:  NPH 13 units AM/ NPH 8 units QHS     --Will follow patient during hospitalization--  Wyn Quaker RN, MSN, CDE Diabetes Coordinator Inpatient Glycemic Control Team Team Pager: [phone] (8a-5p)

## 2016-07-22 NOTE — Progress Notes (Signed)
1 Day Post-Op  Subjective: Patient still on nasal cannula and appears a little short of breath. She states she had similar issues after back surgery.  Objective: Vital signs in last 24 hours: Temp:  [99 F (37.2 C)-99.4 F (37.4 C)] 99.1 F (37.3 C) (12/11 0819) Pulse Rate:  [88-105] 88 (12/11 0819) Resp:  [16-32] 16 (12/11 0819) BP: (99-141)/(51-72) 109/51 (12/11 0819) SpO2:  [93 %-99 %] 97 % (12/11 0819) Last BM Date: 07/21/16  Intake/Output from previous day: 12/10 0701 - 12/11 0700 In: 925 [I.V.:875; IV Piggyback:50] Out: 210 [Urine:200; Blood:10] Intake/Output this shift: No intake/output data recorded.  General appearance: alert, cooperative, fatigued and no distress Resp: Clear to auscultation with occasional rhonchi noted. No wheezing noted. Cardio: regular rate and rhythm, S1, S2 normal, no murmur, click, rub or gallop GI: Soft, dressings dry and intact.  Lab Results:   Recent Labs  07/20/16 2310 07/22/16 0531  WBC 16.8* 16.1*  HGB 11.5* 9.1*  HCT 36.6 30.3*  PLT 346 290   BMET  Recent Labs  07/20/16 2310 07/22/16 0531  NA 139 136  K 3.2* 3.3*  CL 103 102  CO2 27 27  GLUCOSE 186* 270*  BUN 17 19  CREATININE 0.74 1.16*  CALCIUM 9.2 8.1*   PT/INR No results for input(s): LABPROT, INR in the last 72 hours.  Studies/Results: Dg Chest 2 View  Result Date: 07/21/2016 CLINICAL DATA:  Appendicitis EXAM: CHEST  2 VIEW COMPARISON:  12/25/2014 FINDINGS: Elevated left hemidiaphragm, unchanged. No consolidation. No effusions. Hilar and mediastinal contours are unchanged. IMPRESSION: No acute cardiopulmonary disease. Electronically Signed   By: Andreas Newport M.D.   On: 07/21/2016 04:32   Ct Abdomen Pelvis W Contrast  Result Date: 07/21/2016 CLINICAL DATA:  History of Crohn disease. Gradually worsening epigastric abdominal pain since this morning. The pain became constant at 15:00 EXAM: CT ABDOMEN AND PELVIS WITH CONTRAST TECHNIQUE: Multidetector CT  imaging of the abdomen and pelvis was performed using the standard protocol following bolus administration of intravenous contrast. CONTRAST:  122m ISOVUE-300 IOPAMIDOL (ISOVUE-300) INJECTION 61% COMPARISON:  08/11/2015 FINDINGS: Lower chest: Mild atelectatic appearing linear lung base opacities. No consolidation or effusion. Hepatobiliary: No focal liver abnormality is seen. No gallstones, gallbladder wall thickening, or biliary dilatation. Pancreas: Unremarkable. No pancreatic ductal dilatation or surrounding inflammatory changes. Spleen: Normal in size without focal abnormality. Adrenals/Urinary Tract: Adrenal glands are unremarkable. Kidneys are normal, without renal calculi, focal lesion, or hydronephrosis. Bladder is unremarkable. Stomach/Bowel: The appendix is abnormally dilated and contain numerous appendicoliths. It measures up to 1.8 cm. A mild degree of inflammation is present in the fat around the appendiceal tip. Although this is not the typical intensely inflamed appearance of acute appendicitis, it is quite abnormal and this appendix did appear normal on 08/11/2015. No evidence of small bowel obstruction. No other inflammatory segments of bowel. Small bowel is unremarkable, with no evidence of Crohn's exacerbation. Colon is remarkable only for mild uncomplicated diverticulosis. Vascular/Lymphatic: The abdominal aorta is normal in caliber with mild atherosclerotic calcification. No adenopathy in the abdomen or pelvis. Reproductive: Hysterectomy.  No adnexal abnormalities. Other: No ascites. Musculoskeletal: Prior lumbar posterior decompression with instrumented fusion. No significant skeletal lesions. IMPRESSION: Atypical appendicitis: abnormal dilatation of the appendix with numerous appendicoliths but with only a relatively mild degree of inflammation around the appendiceal tip. It is possible that there also is a component of more chronic appendiceal dilatation/mucocele, but this is a dramatic  change from 08/11/2015 when the appendix appeared entirely  normal. No other acute findings are evident. These results were called by telephone at the time of interpretation on 07/21/2016 at 2:42 am to Dr. Rolland Porter , who verbally acknowledged these results. Electronically Signed   By: Andreas Newport M.D.   On: 07/21/2016 02:59   Dg Abd 2 Views  Result Date: 07/21/2016 CLINICAL DATA:  Worsening abdominal pain today. Nausea and diarrhea. EXAM: ABDOMEN - 2 VIEW COMPARISON:  CT 08/11/2015 FINDINGS: Air-filled nondilated small bowel loops in the central left abdomen have amorphous features. Air-fluid level is noted in the stomach. There is no bowel dilatation to suggest obstruction. Small volume of colonic stool. No radiopaque calculi. There are pelvic phleboliths. Postsurgical change in the lumbar spine. IMPRESSION: Air-filled nondilated small bowel in the central left abdomen with amorphous features, can be seen with small bowel inflammation. No evidence of obstruction. Electronically Signed   By: Jeb Levering M.D.   On: 07/21/2016 00:06    Anti-infectives: Anti-infectives    Start     Dose/Rate Route Frequency Ordered Stop   07/21/16 1400  piperacillin-tazobactam (ZOSYN) IVPB 3.375 g     3.375 g 12.5 mL/hr over 240 Minutes Intravenous Every 8 hours 07/21/16 1305     07/21/16 0900  Ampicillin-Sulbactam (UNASYN) 3 g in sodium chloride 0.9 % 100 mL IVPB     3 g 200 mL/hr over 30 Minutes Intravenous  Once 07/21/16 0748 07/21/16 0902   07/21/16 0300  Ampicillin-Sulbactam (UNASYN) 3 g in sodium chloride 0.9 % 100 mL IVPB     3 g 200 mL/hr over 30 Minutes Intravenous  Once 07/21/16 0245 07/21/16 0401      Assessment/Plan: s/p Procedure(s): APPENDECTOMY LAPAROSCOPIC Impression: Patient appears short of breath, status post laparoscopic appendectomy. Blood sugars still elevated but not critical. Will start nebulizer treatments. We will continue to monitor over next 24 hours.  LOS: 0 days     Dejha King A 07/22/2016

## 2016-07-22 NOTE — Anesthesia Postprocedure Evaluation (Signed)
Anesthesia Post Note  Patient: Felicia Pugh  Procedure(s) Performed: Procedure(s) (LRB): APPENDECTOMY LAPAROSCOPIC (N/A)  Patient location during evaluation: Nursing Unit Anesthesia Type: General Level of consciousness: awake and alert, oriented and patient cooperative Pain management: pain level controlled Vital Signs Assessment: post-procedure vital signs reviewed and stable Respiratory status: spontaneous breathing, nonlabored ventilation and respiratory function stable Cardiovascular status: blood pressure returned to baseline Postop Assessment: no signs of nausea or vomiting Anesthetic complications: no    Last Vitals:  Vitals:   07/22/16 0819 07/22/16 1447  BP: (!) 109/51 (!) 119/53  Pulse: 88 87  Resp: 16 20  Temp: 37.3 C 37.1 C    Last Pain:  Vitals:   07/22/16 1447  TempSrc: Oral  PainSc:                  Gurshaan Matsuoka J

## 2016-07-22 NOTE — Care Management Note (Signed)
Case Management Note  Patient Details  Name: Felicia Pugh MRN: [id number] Date of Birth: [birth year]  Subjective/Objective:                  Pt admitted with appendicitis. Chart reviewed for CM needs. Pt is from home, lives with her husband and is ind with ADL's. She has PCP. Transportation, and insurance with drug coverage. She has no HH or DME needs.   Action/Plan: Plan for DC home with self care. No CM needs anticipated.   Expected Discharge Date:    07/23/2016              Expected Discharge Plan:  Home/Self Care  In-House Referral:  NA  Discharge planning Services  CM Consult  Post Acute Care Choice:  NA Choice offered to:  NA  Status of Service:  Completed, signed off  Sherald Barge, RN 07/22/2016, 4:14 PM

## 2016-07-22 NOTE — Progress Notes (Signed)
Pt ambulated in hallway about 80 ft. O2 saturation dropped down to mid 80 without oxygen, with wheezed heard. Saturation returned back to 100 when placed on 3L. Pt returned back to room and is sitting on the side of the side of the bed, family members are present.

## 2016-07-22 NOTE — Addendum Note (Signed)
Addendum  created 07/22/16 1847 by Charmaine Downs, CRNA   Sign clinical note

## 2016-07-23 ENCOUNTER — Inpatient Hospital Stay (HOSPITAL_COMMUNITY): Payer: Medicare HMO

## 2016-07-23 DIAGNOSIS — R0600 Dyspnea, unspecified: Secondary | ICD-10-CM

## 2016-07-23 LAB — BASIC METABOLIC PANEL
Anion gap: 9 (ref 5–15)
BUN: 14 mg/dL (ref 6–20)
CALCIUM: 8.4 mg/dL — AB (ref 8.9–10.3)
CHLORIDE: 100 mmol/L — AB (ref 101–111)
CO2: 27 mmol/L (ref 22–32)
CREATININE: 0.72 mg/dL (ref 0.44–1.00)
GFR calc Af Amer: >60 mL/min (ref >60)
Glucose, Bld: 257 mg/dL — ABNORMAL HIGH (ref 65–99)
Potassium: 2.8 mmol/L — ABNORMAL LOW (ref 3.5–5.1)
SODIUM: 136 mmol/L (ref 135–145)

## 2016-07-23 LAB — CBC
HCT: 31.4 % — ABNORMAL LOW (ref 36.0–46.0)
Hemoglobin: 10 g/dL — ABNORMAL LOW (ref 12.0–15.0)
MCH: 28.6 pg (ref 26.0–34.0)
MCHC: 31.8 g/dL (ref 30.0–36.0)
MCV: 89.7 fL (ref 78.0–100.0)
PLATELETS: 294 10*3/uL (ref 150–400)
RBC: 3.5 MIL/uL — AB (ref 3.87–5.11)
RDW: 14 % (ref 11.5–15.5)
WBC: 12.5 10*3/uL — AB (ref 4.0–10.5)

## 2016-07-23 LAB — GLUCOSE, CAPILLARY
GLUCOSE-CAPILLARY: 227 mg/dL — AB (ref 65–99)
Glucose-Capillary: 176 mg/dL — ABNORMAL HIGH (ref 65–99)
Glucose-Capillary: 83 mg/dL (ref 65–99)
Glucose-Capillary: 126 mg/dL — ABNORMAL HIGH (ref 65–99)

## 2016-07-23 LAB — ECHOCARDIOGRAM COMPLETE
Height: 62 in
WEIGHTICAEL: 3015.89 [oz_av]

## 2016-07-23 MED ORDER — INSULIN NPH (HUMAN) (ISOPHANE) 100 UNIT/ML ~~LOC~~ SUSP
13.0000 [IU] | Freq: Every day | SUBCUTANEOUS | Status: DC
  Administered 2016-07-24 – 2016-07-25 (×2): 13 [IU] via SUBCUTANEOUS

## 2016-07-23 MED ORDER — INSULIN NPH (HUMAN) (ISOPHANE) 100 UNIT/ML ~~LOC~~ SUSP
8.0000 [IU] | Freq: Every day | SUBCUTANEOUS | Status: DC
  Administered 2016-07-23 – 2016-07-24 (×2): 8 [IU] via SUBCUTANEOUS
  Filled 2016-07-23: qty 10

## 2016-07-23 MED ORDER — IOPAMIDOL (ISOVUE-370) INJECTION 76%
100.0000 mL | Freq: Once | INTRAVENOUS | Status: AC | PRN
  Administered 2016-07-23: 100 mL via INTRAVENOUS

## 2016-07-23 NOTE — Progress Notes (Addendum)
Inpatient Diabetes Program Recommendations  AACE/ADA: New Consensus Statement on Inpatient Glycemic Control (2015)  Target Ranges:  Prepandial:   less than 140 mg/dL      Peak postprandial:   less than 180 mg/dL (1-2 hours)      Critically ill patients:  140 - 180 mg/dL   Results for Felicia Pugh, Felicia Pugh (MRN [id number]) as of 07/23/2016 09:29  Ref. Range 07/22/2016 07:42 07/22/2016 11:12 07/22/2016 16:41 07/22/2016 21:21  Glucose-Capillary Latest Ref Range: 65 - 99 mg/dL 257 (H) 217 (H) 153 (H) 229 (H)   Results for [name], [name] (MRN [id number]) as of 07/23/2016 09:29  Ref. Range 07/23/2016 07:18  Glucose-Capillary Latest Ref Range: 65 - 99 mg/dL 227 (H)    Admit with: Acute Appendicitis  History: DM, CVA  Home DM Meds: Glipizide 10 mg daily                             NPH Insulin- 26 units AM/ 15 units PM  Current Insulin Orders: Glipizide 10 mg daily                                       Novolog Moderate Correction Scale/ SSI (0-15 units) TID AC        MD- Please consider restarting 50% of pt's home dose of NPH Insulin:  NPH 13 units AM/ NPH 8 units QHS   Addendum 1013am- Called RN caring for pt today and asked her to please contact the Attending physician to ask if they would be willing to restart a portion of pt's home dose of NPH insulin.      --Will follow patient during hospitalization--  Wyn Quaker RN, MSN, CDE Diabetes Coordinator Inpatient Glycemic Control Team Team Pager: [phone] (8a-5p)

## 2016-07-23 NOTE — Progress Notes (Signed)
2 Days Post-Op  Subjective: Patient still gets short of breath on ambulation. She denies any chest pain, or leg pain. She reports that she was getting short of breath while shopping last week, prior to her surgery. She also reports she had some breathing issues after back surgery in the past, although she was in the hospital for almost 20 days after that surgery. She states she was having some intermittent wheezing prior to admission.  Objective: Vital signs in last 24 hours: Temp:  [98.5 F (36.9 C)-98.7 F (37.1 C)] 98.5 F (36.9 C) (12/12 0500) Pulse Rate:  [87-93] 93 (12/12 0500) Resp:  [18-20] 18 (12/12 0500) BP: (119-123)/(53-69) 123/69 (12/12 0500) SpO2:  [99 %-100 %] 99 % (12/12 0500) FiO2 (%):  [100 %] 100 % (12/11 1511) Last BM Date: 07/20/16  Intake/Output from previous day: 12/11 0701 - 12/12 0700 In: 720 [P.O.:720] Out: 600 [Urine:600] Intake/Output this shift: No intake/output data recorded.  General appearance: alert, cooperative and no distress Resp: clear to auscultation bilaterally Cardio: regular rate and rhythm, S1, S2 normal, no murmur, click, rub or gallop GI: Soft. Bowel sounds active. Nondistended. Incisions healing well. Extremity: No pretibial edema, negative Homan's sign Lab Results:   Recent Labs  07/22/16 0531 07/23/16 0523  WBC 16.1* 12.5*  HGB 9.1* 10.0*  HCT 30.3* 31.4*  PLT 290 294   BMET  Recent Labs  07/22/16 0531 07/23/16 0523  NA 136 136  K 3.3* 2.8*  CL 102 100*  CO2 27 27  GLUCOSE 270* 257*  BUN 19 14  CREATININE 1.16* 0.72  CALCIUM 8.1* 8.4*   PT/INR No results for input(s): LABPROT, INR in the last 72 hours.  Studies/Results: Dg Chest 2 View  Result Date: 07/22/2016 CLINICAL DATA:  Shortness of breath, appendectomy yesterday EXAM: CHEST  2 VIEW COMPARISON:  07/21/2016 FINDINGS: There is elevation of the left diaphragm. There is no focal parenchymal opacity. There is no pleural effusion or pneumothorax. The heart  and mediastinal contours are unremarkable. The osseous structures are unremarkable. IMPRESSION: No active cardiopulmonary disease. Electronically Signed   By: Kathreen Devoid   On: 07/22/2016 13:00    Anti-infectives: Anti-infectives    Start     Dose/Rate Route Frequency Ordered Stop   07/21/16 1400  piperacillin-tazobactam (ZOSYN) IVPB 3.375 g     3.375 g 12.5 mL/hr over 240 Minutes Intravenous Every 8 hours 07/21/16 1305     07/21/16 0900  Ampicillin-Sulbactam (UNASYN) 3 g in sodium chloride 0.9 % 100 mL IVPB     3 g 200 mL/hr over 30 Minutes Intravenous  Once 07/21/16 0748 07/21/16 0902   07/21/16 0300  Ampicillin-Sulbactam (UNASYN) 3 g in sodium chloride 0.9 % 100 mL IVPB     3 g 200 mL/hr over 30 Minutes Intravenous  Once 07/21/16 0245 07/21/16 0401      Assessment/Plan: s/p Procedure(s): APPENDECTOMY LAPAROSCOPIC Impression: Shortness of breath on ambulation. Postoperative EKG was not significantly changed from preoperatively. Her BNP and troponin are all within normal limits. She denies any calf pain. She may have had a pulmonary process that was developing prior to surgery. Will get Dr. Luan Pulling of pulmonology on consultation. He has been notified. pulmonary embolism is low on the differential given her physical exam findings. She does saturate at 97-100% while lying in the bed off oxygen.  LOS: 1 day    Ronica Vivian A 07/23/2016

## 2016-07-23 NOTE — Consult Note (Signed)
Consult requested by: Dr. Arnoldo Morale Consult requested for hypoxia:  HPI: This is a 70 year old who underwent laparoscopic appendectomy on the 10th. She has been hypoxic when she ambulates since. She says that she has been having trouble with feeling tired after she exerts herself. She doesn't have any known lung disease. She doesn't have any known heart disease. She does have diabetes. She has been coughing. Her cough has been nonproductive. No chest pain. No hemoptysis no swelling of her legs. She doesn't have PND or orthopnea  Past Medical History:  Diagnosis Date  . 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 Cataract   . Complication of anesthesia    difficulty breathing after waking up from anesthesia  . Crohn's disease ([place])   . Diabetes mellitus without complication ([place])   . Diabetic neuropathy ([place])   . Hyperlipidemia   . Hypertension   . IBS (irritable bowel syndrome)   . Stroke [facility]) 2011  . Syncope and collapse   . Thyroid nodule   . Ulcer ([place])      Family History  Problem Relation Age of Onset  . Hypertension Mother   . Stroke Mother   . Diabetes Father     w/ BKA  . Hypertension Father   . Cancer Sister   . Diabetes Sister     stomach  . Diabetes Brother   . Diabetes Sister   . Cancer Sister     breast  . Hypertension Sister   . Colon cancer Neg Hx   . Inflammatory bowel disease Neg Hx      Social History   Social History  . Marital status: Married    Spouse name: N/A  . Number of children: 5  . Years of education: N/A   Social History Main Topics  . Smoking status: Never Smoker  . Smokeless tobacco: Never Used  . Alcohol use No  . Drug use: No  . Sexual activity: No   Other Topics Concern  . None   Social History Narrative  . None     ROS: Except as mentioned above 10 point review of systems is negative    Objective: Vital signs in last 24 hours: Temp:  [98.5 F (36.9 C)-98.7 F  (37.1 C)] 98.5 F (36.9 C) (12/12 0500) Pulse Rate:  [87-93] 93 (12/12 0500) Resp:  [18-20] 18 (12/12 0500) BP: (119-123)/(53-69) 123/69 (12/12 0500) SpO2:  [99 %-100 %] 99 % (12/12 0500) FiO2 (%):  [100 %] 100 % (12/11 1511) Weight change:  Last BM Date: 07/20/16  Intake/Output from previous day: 12/11 0701 - 12/12 0700 In: 720 [P.O.:720] Out: 600 [Urine:600]  PHYSICAL EXAM Constitutional: She is awake and alert and in no distress wearing nasal oxygen. Eyes: Pupils react. EOMI. Ears nose mouth and throat: Mucous membranes are moist. Hearing is normal. Cardiovascular: Her heart is regular with normal heart sounds. No edema. Respiratory: She has some rales in the bases. Respiratory effort is slightly increased. Gastrointestinal: Her abdomen is fairly soft. Neurological: No focal abnormalities skin: Warm and dry  Lab Results: Basic Metabolic Panel:  Recent Labs  07/22/16 0531 07/23/16 0523  NA 136 136  K 3.3* 2.8*  CL 102 100*  CO2 27 27  GLUCOSE 270* 257*  BUN 19 14  CREATININE 1.16* 0.72  CALCIUM 8.1* 8.4*   Liver Function Tests:  Recent Labs  07/20/16 2310  AST 21  ALT 18  ALKPHOS 124  BILITOT  0.2*  PROT 7.7  ALBUMIN 4.2    Recent Labs  07/20/16 2310  LIPASE 49   No results for input(s): AMMONIA in the last 72 hours. CBC:  Recent Labs  07/20/16 2310 07/22/16 0531 07/23/16 0523  WBC 16.8* 16.1* 12.5*  NEUTROABS 14.5*  --   --   HGB 11.5* 9.1* 10.0*  HCT 36.6 30.3* 31.4*  MCV 89.9 90.4 89.7  PLT 346 290 294   Cardiac Enzymes:  Recent Labs  07/22/16 2058  TROPONINI <0.03   BNP: No results for input(s): PROBNP in the last 72 hours. D-Dimer: No results for input(s): DDIMER in the last 72 hours. CBG:  Recent Labs  07/21/16 2104 07/22/16 0742 07/22/16 1112 07/22/16 1641 07/22/16 2121 07/23/16 0718  GLUCAP 211* 257* 217* 153* 229* 227*   Hemoglobin A1C: No results for input(s): HGBA1C in the last 72 hours. Fasting Lipid Panel: No  results for input(s): CHOL, HDL, LDLCALC, TRIG, CHOLHDL, LDLDIRECT in the last 72 hours. Thyroid Function Tests: No results for input(s): TSH, T4TOTAL, FREET4, T3FREE, THYROIDAB in the last 72 hours. Anemia Panel: No results for input(s): VITAMINB12, FOLATE, FERRITIN, TIBC, IRON, RETICCTPCT in the last 72 hours. Coagulation: No results for input(s): LABPROT, INR in the last 72 hours. Urine Drug Screen: Drugs of Abuse  No results found for: LABOPIA, COCAINSCRNUR, LABBENZ, AMPHETMU, THCU, LABBARB  Alcohol Level: No results for input(s): ETH in the last 72 hours. Urinalysis:  Recent Labs  07/21/16 0017  COLORURINE YELLOW  LABSPEC 1.020  PHURINE 6.0  GLUCOSEU 50*  HGBUR NEGATIVE  BILIRUBINUR NEGATIVE  KETONESUR NEGATIVE  PROTEINUR 100*  NITRITE NEGATIVE  LEUKOCYTESUR SMALL*   Misc. Labs:   ABGS: No results for input(s): PHART, PO2ART, TCO2, HCO3 in the last 72 hours.  Invalid input(s): PCO2   MICROBIOLOGY: Recent Results (from the past 240 hour(s))  Surgical PCR screen     Status: None   Collection Time: 07/21/16  7:59 AM  Result Value Ref Range Status   MRSA, PCR NEGATIVE NEGATIVE Final   Staphylococcus aureus NEGATIVE NEGATIVE Final    Comment:        The Xpert SA Assay (FDA approved for NASAL specimens in patients over 44 years of age), is one component of a comprehensive surveillance program.  Test performance has been validated by [facility] for patients greater than or equal to 59 year old. It is not intended to diagnose infection nor to guide or monitor treatment.     Studies/Results: Dg Chest 2 View  Result Date: 07/22/2016 CLINICAL DATA:  Shortness of breath, appendectomy yesterday EXAM: CHEST  2 VIEW COMPARISON:  07/21/2016 FINDINGS: There is elevation of the left diaphragm. There is no focal parenchymal opacity. There is no pleural effusion or pneumothorax. The heart and mediastinal contours are unremarkable. The osseous structures are  unremarkable. IMPRESSION: No active cardiopulmonary disease. Electronically Signed   By: Kathreen Devoid   On: 07/22/2016 13:00    Medications:  Prior to Admission:  Prescriptions Prior to Admission  Medication Sig Dispense Refill Last Dose  . atorvastatin (LIPITOR) 40 MG tablet Take 1 tablet (40 mg total) by mouth daily. 90 tablet 1 07/20/2016 at Unknown time  . balsalazide (COLAZAL) 750 MG capsule TAKE THREE CAPSULES BY MOUTH THREE TIMES DAILY 270 capsule 3 07/20/2016 at Unknown time  . dicyclomine (BENTYL) 10 MG capsule TAKE ONE CAPSULE BY MOUTH 4 TIMES DAILY WITH MEALS AND AT BEDTIME AS NEEDED FOR  CRAMPS  AND  DIARRHEA 120 capsule 3  07/20/2016 at Unknown time  . glipiZIDE (GLUCOTROL) 10 MG tablet Take 1 tablet (10 mg total) by mouth daily. 90 tablet 1 07/20/2016 at Unknown time  . glucose blood (ONETOUCH VERIO) test strip Test bid. DX E11.9 100 each 5 07/20/2016 at Unknown time  . insulin NPH Human (NOVOLIN N RELION) 100 UNIT/ML injection Inject 26 units in the morning and then 15 units at night 10 mL 6 07/20/2016 at Unknown time  . Insulin Syringes, Disposable, U-100 0.5 ML MISC Give insulin twice daily as directed 100 each 1 07/20/2016 at Unknown time  . lisinopril-hydrochlorothiazide (PRINZIDE,ZESTORETIC) 20-12.5 MG tablet Take 1 tablet by mouth daily. 90 tablet 1 07/20/2016 at Unknown time  . Multiple Vitamin (MULTIVITAMIN) tablet Take 1 tablet by mouth daily.   07/20/2016 at Unknown time   Scheduled: . balsalazide  2,250 mg Oral TID  . dicyclomine  10 mg Oral TID AC & HS  . enoxaparin (LOVENOX) injection  40 mg Subcutaneous Q24H  . glipiZIDE  10 mg Oral QAC breakfast  . lisinopril  20 mg Oral Daily   And  . hydrochlorothiazide  12.5 mg Oral Daily  . insulin aspart  0-15 Units Subcutaneous TID WC  . piperacillin-tazobactam (ZOSYN)  IV  3.375 g Intravenous Q8H   Continuous:  QNV:[id number] **OR** acetaminophen, albuterol, diphenhydrAMINE **OR** diphenhydrAMINE, HYDROcodone-acetaminophen,  HYDROmorphone (DILAUDID) injection, LORazepam, ondansetron **OR** ondansetron (ZOFRAN) IV, simethicone  Assesment: She was admitted with appendicitis. She has had surgery for that. At baseline she has diabetes and hypertension. She does not have any history of cardiac or lung disease. However she is significantly hypoxic and is coughing. Active Problems:   Appendicitis    Plan: Echocardiogram. CT chest. No other treatment until we see the results    LOS: 1 day   Valree Feild L 07/23/2016, 9:05 AM

## 2016-07-23 NOTE — Progress Notes (Signed)
*  PRELIMINARY RESULTS* Echocardiogram 2D Echocardiogram has been performed.  Leavy Cella 07/23/2016, 11:26 AM

## 2016-07-24 LAB — GLUCOSE, CAPILLARY
GLUCOSE-CAPILLARY: 118 mg/dL — AB (ref 65–99)
GLUCOSE-CAPILLARY: 142 mg/dL — AB (ref 65–99)
GLUCOSE-CAPILLARY: 113 mg/dL — AB (ref 65–99)
Glucose-Capillary: 200 mg/dL — ABNORMAL HIGH (ref 65–99)

## 2016-07-24 MED ORDER — ALBUTEROL SULFATE (2.5 MG/3ML) 0.083% IN NEBU
2.5000 mg | INHALATION_SOLUTION | Freq: Four times a day (QID) | RESPIRATORY_TRACT | Status: DC
Start: 2016-07-24 — End: 2016-07-25
  Administered 2016-07-24 – 2016-07-25 (×5): 2.5 mg via RESPIRATORY_TRACT
  Filled 2016-07-24 (×4): qty 3

## 2016-07-24 MED ORDER — FUROSEMIDE 10 MG/ML IJ SOLN
40.0000 mg | Freq: Once | INTRAMUSCULAR | Status: AC
  Administered 2016-07-24: 40 mg via INTRAVENOUS
  Filled 2016-07-24: qty 4

## 2016-07-24 MED ORDER — SODIUM CHLORIDE 0.9% FLUSH: 3.0000 mL | INTRAVENOUS | Status: DC | PRN

## 2016-07-24 MED ORDER — SODIUM CHLORIDE 0.9% FLUSH
3.0000 mL | Freq: Two times a day (BID) | INTRAVENOUS | Status: DC
  Administered 2016-07-24 (×2): 3 mL via INTRAVENOUS

## 2016-07-24 MED ORDER — POTASSIUM CHLORIDE CRYS ER 20 MEQ PO TBCR
20.0000 meq | EXTENDED_RELEASE_TABLET | Freq: Three times a day (TID) | ORAL | Status: DC
  Administered 2016-07-24 – 2016-07-25 (×4): 20 meq via ORAL
  Filled 2016-07-24 (×4): qty 1

## 2016-07-24 MED ORDER — SODIUM CHLORIDE 0.9 % IV SOLN: 250.0000 mL | INTRAVENOUS | Status: DC | PRN

## 2016-07-24 NOTE — Progress Notes (Signed)
Patient turned down to one liter of oxygen.  Patient oxygen saturation is currently 98% on one liter of oxygen while patient is sleeping.

## 2016-07-24 NOTE — Progress Notes (Signed)
Subjective: She says she is still somewhat short of breath. Her chest CT and echocardiogram did not show a cause for her to be short of breath. She says she has been coughing some and it was productive of a yellowish sputum. That should be covered by her current Zosyn. She did not take any med treatments yesterday. She is still wearing oxygen. She denies any chest pain. She has some pain in her abdomen where she had surgery.  Objective: Vital signs in last 24 hours: Temp:  [98.2 F (36.8 C)-98.7 F (37.1 C)] 98.7 F (37.1 C) (12/13 0439) Pulse Rate:  [76-89] 77 (12/13 0439) Resp:  [18-20] 18 (12/13 0439) BP: (97-106)/(44-60) 98/44 (12/13 0439) SpO2:  [96 %-98 %] 98 % (12/13 0439) Weight change:  Last BM Date: 07/20/16  Intake/Output from previous day: 12/12 0701 - 12/13 0700 In: 720 [P.O.:720] Out: 400 [Urine:400]  PHYSICAL EXAM General appearance: alert, cooperative and mild distress Resp: clear to auscultation bilaterally Cardio: regular rate and rhythm, S1, S2 normal, no murmur, click, rub or gallop GI: soft, non-tender; bowel sounds normal; no masses,  no organomegaly Extremities: extremities normal, atraumatic, no cyanosis or edema Mucous membranes are moist. Pupils react. Skin warm and dry.  Lab Results:  Results for orders placed or performed during the hospital encounter of 07/20/16 (from the past 48 hour(s))  Glucose, capillary     Status: Abnormal   Collection Time: 07/22/16 11:12 AM  Result Value Ref Range   Glucose-Capillary 217 (H) 65 - 99 mg/dL  Glucose, capillary     Status: Abnormal   Collection Time: 07/22/16  4:41 PM  Result Value Ref Range   Glucose-Capillary 153 (H) 65 - 99 mg/dL   Comment 1 Notify RN   Brain natriuretic peptide     Status: None   Collection Time: 07/22/16  8:55 PM  Result Value Ref Range   B Natriuretic Peptide 84.0 0.0 - 100.0 pg/mL  Troponin I     Status: None   Collection Time: 07/22/16  8:58 PM  Result Value Ref Range    Troponin I <0.03 <0.03 ng/mL  Glucose, capillary     Status: Abnormal   Collection Time: 07/22/16  9:21 PM  Result Value Ref Range   Glucose-Capillary 229 (H) 65 - 99 mg/dL  Basic metabolic panel     Status: Abnormal   Collection Time: 07/23/16  5:23 AM  Result Value Ref Range   Sodium 136 135 - 145 mmol/L   Potassium 2.8 (L) 3.5 - 5.1 mmol/L   Chloride 100 (L) 101 - 111 mmol/L   CO2 27 22 - 32 mmol/L   Glucose, Bld 257 (H) 65 - 99 mg/dL   BUN 14 6 - 20 mg/dL   Creatinine, Ser 0.72 0.44 - 1.00 mg/dL   Calcium 8.4 (L) 8.9 - 10.3 mg/dL   GFR calc non Af Amer >60 >60 mL/min   GFR calc Af Amer >60 >60 mL/min    Comment: (NOTE) The eGFR has been calculated using the CKD EPI equation. This calculation has not been validated in all clinical situations. eGFR's persistently <60 mL/min signify possible Chronic Kidney Disease.    Anion gap 9 5 - 15  CBC     Status: Abnormal   Collection Time: 07/23/16  5:23 AM  Result Value Ref Range   WBC 12.5 (H) 4.0 - 10.5 K/uL   RBC 3.50 (L) 3.87 - 5.11 MIL/uL   Hemoglobin 10.0 (L) 12.0 - 15.0 g/dL   HCT 31.4 (  L) 36.0 - 46.0 %   MCV 89.7 78.0 - 100.0 fL   MCH 28.6 26.0 - 34.0 pg   MCHC 31.8 30.0 - 36.0 g/dL   RDW 14.0 11.5 - 15.5 %   Platelets 294 150 - 400 K/uL  Glucose, capillary     Status: Abnormal   Collection Time: 07/23/16  7:18 AM  Result Value Ref Range   Glucose-Capillary 227 (H) 65 - 99 mg/dL   Comment 1 Notify RN    Comment 2 Document in Chart   Glucose, capillary     Status: Abnormal   Collection Time: 07/23/16 11:37 AM  Result Value Ref Range   Glucose-Capillary 176 (H) 65 - 99 mg/dL   Comment 1 Notify RN    Comment 2 Document in Chart   Glucose, capillary     Status: None   Collection Time: 07/23/16  3:59 PM  Result Value Ref Range   Glucose-Capillary 83 65 - 99 mg/dL   Comment 1 Notify RN    Comment 2 Document in Chart   Glucose, capillary     Status: Abnormal   Collection Time: 07/23/16  9:07 PM  Result Value Ref  Range   Glucose-Capillary 126 (H) 65 - 99 mg/dL  Glucose, capillary     Status: Abnormal   Collection Time: 07/24/16  7:39 AM  Result Value Ref Range   Glucose-Capillary 118 (H) 65 - 99 mg/dL   Comment 1 Notify RN    Comment 2 Document in Chart     ABGS No results for input(s): PHART, PO2ART, TCO2, HCO3 in the last 72 hours.  Invalid input(s): PCO2 CULTURES Recent Results (from the past 240 hour(s))  Surgical PCR screen     Status: None   Collection Time: 07/21/16  7:59 AM  Result Value Ref Range Status   MRSA, PCR NEGATIVE NEGATIVE Final   Staphylococcus aureus NEGATIVE NEGATIVE Final    Comment:        The Xpert SA Assay (FDA approved for NASAL specimens in patients over 71 years of age), is one component of a comprehensive surveillance program.  Test performance has been validated by [facility] for patients greater than or equal to 70 year old. It is not intended to diagnose infection nor to guide or monitor treatment.    Studies/Results: Dg Chest 2 View  Result Date: 07/22/2016 CLINICAL DATA:  Shortness of breath, appendectomy yesterday EXAM: CHEST  2 VIEW COMPARISON:  07/21/2016 FINDINGS: There is elevation of the left diaphragm. There is no focal parenchymal opacity. There is no pleural effusion or pneumothorax. The heart and mediastinal contours are unremarkable. The osseous structures are unremarkable. IMPRESSION: No active cardiopulmonary disease. Electronically Signed   By: Kathreen Devoid   On: 07/22/2016 13:00   Ct Angio Chest Pe W Or Wo Contrast  Result Date: 07/23/2016 CLINICAL DATA:  Shortness of breath and weakness. Status post appendectomy on 07/20/2016. EXAM: CT ANGIOGRAPHY CHEST WITH CONTRAST TECHNIQUE: Multidetector CT imaging of the chest was performed using the standard protocol during bolus administration of intravenous contrast. Multiplanar CT image reconstructions and MIPs were obtained to evaluate the vascular anatomy. CONTRAST:  100 mL Isovue 370  IV COMPARISON:  Chest x-ray earlier today. FINDINGS: Cardiovascular: Satisfactory opacification of the pulmonary arteries to the segmental level. No evidence of pulmonary embolism. Normal heart size. No pericardial effusion. Mediastinum/Nodes: No enlarged mediastinal, hilar, or axillary lymph nodes. Incidental midline thyroid nodule appears to emanate from the inferior isthmus and measures just over 2 cm  in width. Lungs/Pleura: There is no evidence of pulmonary edema, consolidation, pneumothorax or nodule. There is atelectasis at both lung bases. Trace amount of left pleural fluid. Upper Abdomen: Small hiatal hernia. The liver shows evidence of mild steatosis. Musculoskeletal: No chest wall abnormality. No acute or significant osseous findings. Review of the MIP images confirms the above findings. IMPRESSION: 1. No evidence of pulmonary embolism. 2. Bibasilar atelectasis.  Trace left pleural effusion. 3. Midline thyroid nodule emanating from the inferior isthmus and measuring just over 2 cm. Recommend elective correlation with thyroid ultrasound and thyroid function tests. Electronically Signed   By: Aletta Edouard M.D.   On: 07/23/2016 10:34    Medications:  Prior to Admission:  Prescriptions Prior to Admission  Medication Sig Dispense Refill Last Dose  . atorvastatin (LIPITOR) 40 MG tablet Take 1 tablet (40 mg total) by mouth daily. 90 tablet 1 07/20/2016 at Unknown time  . balsalazide (COLAZAL) 750 MG capsule TAKE THREE CAPSULES BY MOUTH THREE TIMES DAILY 270 capsule 3 07/20/2016 at Unknown time  . dicyclomine (BENTYL) 10 MG capsule TAKE ONE CAPSULE BY MOUTH 4 TIMES DAILY WITH MEALS AND AT BEDTIME AS NEEDED FOR  CRAMPS  AND  DIARRHEA 120 capsule 3 07/20/2016 at Unknown time  . glipiZIDE (GLUCOTROL) 10 MG tablet Take 1 tablet (10 mg total) by mouth daily. 90 tablet 1 07/20/2016 at Unknown time  . glucose blood (ONETOUCH VERIO) test strip Test bid. DX E11.9 100 each 5 07/20/2016 at Unknown time  . insulin  NPH Human (NOVOLIN N RELION) 100 UNIT/ML injection Inject 26 units in the morning and then 15 units at night 10 mL 6 07/20/2016 at Unknown time  . Insulin Syringes, Disposable, U-100 0.5 ML MISC Give insulin twice daily as directed 100 each 1 07/20/2016 at Unknown time  . lisinopril-hydrochlorothiazide (PRINZIDE,ZESTORETIC) 20-12.5 MG tablet Take 1 tablet by mouth daily. 90 tablet 1 07/20/2016 at Unknown time  . Multiple Vitamin (MULTIVITAMIN) tablet Take 1 tablet by mouth daily.   07/20/2016 at Unknown time   Scheduled: . balsalazide  2,250 mg Oral TID  . dicyclomine  10 mg Oral TID AC & HS  . enoxaparin (LOVENOX) injection  40 mg Subcutaneous Q24H  . glipiZIDE  10 mg Oral QAC breakfast  . lisinopril  20 mg Oral Daily   And  . hydrochlorothiazide  12.5 mg Oral Daily  . insulin aspart  0-15 Units Subcutaneous TID WC  . insulin NPH Human  13 Units Subcutaneous QAC breakfast  . insulin NPH Human  8 Units Subcutaneous QHS  . piperacillin-tazobactam (ZOSYN)  IV  3.375 g Intravenous Q8H   Continuous:  EXH:[id number] **OR** acetaminophen, albuterol, diphenhydrAMINE **OR** diphenhydrAMINE, HYDROcodone-acetaminophen, HYDROmorphone (DILAUDID) injection, LORazepam, ondansetron **OR** ondansetron (ZOFRAN) IV, simethicone  Assesment: She was admitted with appendicitis and has had laparoscopic appendectomy. She has been short of breath and hypoxic postop without any obvious reason. CT of the chest did not show pulmonary emboli pneumonia or volume overload. I personally reviewed this. Echocardiogram does not show significant abnormality. She may have something like bronchitis but that should be treated with Zosyn. Intake and output incomplete but it might be worth giving her a dose of Lasix for potential volume overload. Active Problems:   Appendicitis    Plan: Continue Zosyn. Restart neb treatments scheduled for now. One dose of Lasix and see how that works.    LOS: 2 days   Myrtha Tonkovich  L 07/24/2016, 8:42 AM

## 2016-07-24 NOTE — Addendum Note (Signed)
Addendum  created 07/24/16 0911 by Vista Deck, CRNA   Charge Capture section accepted

## 2016-07-25 LAB — BASIC METABOLIC PANEL
Anion gap: 7 (ref 5–15)
BUN: 11 mg/dL (ref 6–20)
CO2: 35 mmol/L — ABNORMAL HIGH (ref 22–32)
CREATININE: 0.75 mg/dL (ref 0.44–1.00)
Calcium: 8.8 mg/dL — ABNORMAL LOW (ref 8.9–10.3)
Chloride: 97 mmol/L — ABNORMAL LOW (ref 101–111)
GFR calc Af Amer: >60 mL/min (ref >60)
GLUCOSE: 167 mg/dL — AB (ref 65–99)
POTASSIUM: 3.1 mmol/L — AB (ref 3.5–5.1)
Sodium: 139 mmol/L (ref 135–145)

## 2016-07-25 LAB — CBC
HEMATOCRIT: 30.9 % — AB (ref 36.0–46.0)
Hemoglobin: 9.7 g/dL — ABNORMAL LOW (ref 12.0–15.0)
MCH: 28.3 pg (ref 26.0–34.0)
MCHC: 31.4 g/dL (ref 30.0–36.0)
MCV: 90.1 fL (ref 78.0–100.0)
PLATELETS: 314 10*3/uL (ref 150–400)
RBC: 3.43 MIL/uL — ABNORMAL LOW (ref 3.87–5.11)
RDW: 13.4 % (ref 11.5–15.5)
WBC: 7.9 10*3/uL (ref 4.0–10.5)

## 2016-07-25 LAB — GLUCOSE, CAPILLARY: Glucose-Capillary: 162 mg/dL — ABNORMAL HIGH (ref 65–99)

## 2016-07-25 MED ORDER — HYDROCODONE-ACETAMINOPHEN 5-325 MG PO TABS: 1.0000 | ORAL_TABLET | ORAL | 0 refills | Status: DC | PRN

## 2016-07-25 MED ORDER — POTASSIUM CHLORIDE ER 20 MEQ PO TBCR: 20.0000 meq | EXTENDED_RELEASE_TABLET | Freq: Two times a day (BID) | ORAL | 0 refills | Status: DC

## 2016-07-25 NOTE — Progress Notes (Signed)
Patient O2 sats 96% room air. Patient's O2 sats 90% room air while ambulating.

## 2016-07-25 NOTE — Progress Notes (Signed)
Subjective: She says she feels a little better and less short of breath. She thinks taking the Lasix helped. She did have good urine output. No other complaints. She's off oxygen now.  Objective: Vital signs in last 24 hours: Temp:  [98.5 F (36.9 C)-98.7 F (37.1 C)] 98.5 F (36.9 C) (12/14 0555) Pulse Rate:  [72-88] 72 (12/14 0555) Resp:  [16] 16 (12/14 0555) BP: (109-140)/(47-65) 109/49 (12/14 0555) SpO2:  [94 %-99 %] 99 % (12/14 0758) Weight change:  Last BM Date: 07/24/16  Intake/Output from previous day: 12/13 0701 - 12/14 0700 In: 773 [P.O.:720; I.V.:3; IV Piggyback:50] Out: 2504 [Urine:2501; Stool:3]  PHYSICAL EXAM General appearance: alert, cooperative and no distress Resp: clear to auscultation bilaterally Cardio: regular rate and rhythm, S1, S2 normal, no murmur, click, rub or gallop GI: soft, non-tender; bowel sounds normal; no masses,  no organomegaly Extremities: extremities normal, atraumatic, no cyanosis or edema Skin warm and dry. Mucous membranes are moist  Lab Results:  Results for orders placed or performed during the hospital encounter of 07/20/16 (from the past 48 hour(s))  Glucose, capillary     Status: Abnormal   Collection Time: 07/23/16 11:37 AM  Result Value Ref Range   Glucose-Capillary 176 (H) 65 - 99 mg/dL   Comment 1 Notify RN    Comment 2 Document in Chart   Glucose, capillary     Status: None   Collection Time: 07/23/16  3:59 PM  Result Value Ref Range   Glucose-Capillary 83 65 - 99 mg/dL   Comment 1 Notify RN    Comment 2 Document in Chart   Glucose, capillary     Status: Abnormal   Collection Time: 07/23/16  9:07 PM  Result Value Ref Range   Glucose-Capillary 126 (H) 65 - 99 mg/dL  Glucose, capillary     Status: Abnormal   Collection Time: 07/24/16  7:39 AM  Result Value Ref Range   Glucose-Capillary 118 (H) 65 - 99 mg/dL   Comment 1 Notify RN    Comment 2 Document in Chart   Glucose, capillary     Status: Abnormal   Collection  Time: 07/24/16 11:19 AM  Result Value Ref Range   Glucose-Capillary 142 (H) 65 - 99 mg/dL  Glucose, capillary     Status: Abnormal   Collection Time: 07/24/16  4:00 PM  Result Value Ref Range   Glucose-Capillary 113 (H) 65 - 99 mg/dL   Comment 1 Notify RN    Comment 2 Document in Chart   Glucose, capillary     Status: Abnormal   Collection Time: 07/24/16  8:59 PM  Result Value Ref Range   Glucose-Capillary 200 (H) 65 - 99 mg/dL   Comment 1 Notify RN    Comment 2 Document in Chart   Basic metabolic panel     Status: Abnormal   Collection Time: 07/25/16  7:11 AM  Result Value Ref Range   Sodium 139 135 - 145 mmol/L   Potassium 3.1 (L) 3.5 - 5.1 mmol/L   Chloride 97 (L) 101 - 111 mmol/L   CO2 35 (H) 22 - 32 mmol/L   Glucose, Bld 167 (H) 65 - 99 mg/dL   BUN 11 6 - 20 mg/dL   Creatinine, Ser 0.75 0.44 - 1.00 mg/dL   Calcium 8.8 (L) 8.9 - 10.3 mg/dL   GFR calc non Af Amer >60 >60 mL/min   GFR calc Af Amer >60 >60 mL/min    Comment: (NOTE) The eGFR has been calculated using  the CKD EPI equation. This calculation has not been validated in all clinical situations. eGFR's persistently <60 mL/min signify possible Chronic Kidney Disease.    Anion gap 7 5 - 15  CBC     Status: Abnormal   Collection Time: 07/25/16  7:11 AM  Result Value Ref Range   WBC 7.9 4.0 - 10.5 K/uL   RBC 3.43 (L) 3.87 - 5.11 MIL/uL   Hemoglobin 9.7 (L) 12.0 - 15.0 g/dL   HCT 30.9 (L) 36.0 - 46.0 %   MCV 90.1 78.0 - 100.0 fL   MCH 28.3 26.0 - 34.0 pg   MCHC 31.4 30.0 - 36.0 g/dL   RDW 13.4 11.5 - 15.5 %   Platelets 314 150 - 400 K/uL  Glucose, capillary     Status: Abnormal   Collection Time: 07/25/16  7:48 AM  Result Value Ref Range   Glucose-Capillary 162 (H) 65 - 99 mg/dL    ABGS No results for input(s): PHART, PO2ART, TCO2, HCO3 in the last 72 hours.  Invalid input(s): PCO2 CULTURES Recent Results (from the past 240 hour(s))  Surgical PCR screen     Status: None   Collection Time: 07/21/16   7:59 AM  Result Value Ref Range Status   MRSA, PCR NEGATIVE NEGATIVE Final   Staphylococcus aureus NEGATIVE NEGATIVE Final    Comment:        The Xpert SA Assay (FDA approved for NASAL specimens in patients over 32 years of age), is one component of a comprehensive surveillance program.  Test performance has been validated by [facility] for patients greater than or equal to 18 year old. It is not intended to diagnose infection nor to guide or monitor treatment.    Studies/Results: Ct Angio Chest Pe W Or Wo Contrast  Result Date: 07/23/2016 CLINICAL DATA:  Shortness of breath and weakness. Status post appendectomy on 07/20/2016. EXAM: CT ANGIOGRAPHY CHEST WITH CONTRAST TECHNIQUE: Multidetector CT imaging of the chest was performed using the standard protocol during bolus administration of intravenous contrast. Multiplanar CT image reconstructions and MIPs were obtained to evaluate the vascular anatomy. CONTRAST:  100 mL Isovue 370 IV COMPARISON:  Chest x-ray earlier today. FINDINGS: Cardiovascular: Satisfactory opacification of the pulmonary arteries to the segmental level. No evidence of pulmonary embolism. Normal heart size. No pericardial effusion. Mediastinum/Nodes: No enlarged mediastinal, hilar, or axillary lymph nodes. Incidental midline thyroid nodule appears to emanate from the inferior isthmus and measures just over 2 cm in width. Lungs/Pleura: There is no evidence of pulmonary edema, consolidation, pneumothorax or nodule. There is atelectasis at both lung bases. Trace amount of left pleural fluid. Upper Abdomen: Small hiatal hernia. The liver shows evidence of mild steatosis. Musculoskeletal: No chest wall abnormality. No acute or significant osseous findings. Review of the MIP images confirms the above findings. IMPRESSION: 1. No evidence of pulmonary embolism. 2. Bibasilar atelectasis.  Trace left pleural effusion. 3. Midline thyroid nodule emanating from the inferior isthmus and  measuring just over 2 cm. Recommend elective correlation with thyroid ultrasound and thyroid function tests. Electronically Signed   By: Aletta Edouard M.D.   On: 07/23/2016 10:34    Medications:  Prior to Admission:  No prescriptions prior to admission.   Scheduled: Continuous: PRN:  Assesment:She was admitted with appendicitis and had laparoscopic appendectomy. She had shortness of breath and hypoxia postop and is not really clear what this is from. She had echocardiogram that was essentially normal CT of the chest that showed no evidence of pulmonary  embolism bibasilar atelectasis with a trace left pleural effusion and a thyroid nodule. She was given a single dose of Lasix yesterday and it seemed to have helped considerably so I wonder if she got volume overloaded perioperatively although that was not able to be demonstrated on imaging. At any rate she is much better today Active Problems:   Appendicitis    Plan: Continue current treatments    LOS: 3 days   Shaneen Reeser L 07/25/2016, 8:50 AM

## 2016-07-25 NOTE — Discharge Summary (Signed)
Physician Discharge Summary  Patient ID: Felicia Pugh MRN: [id number] DOB/AGE: 1946-06-20 70 y.o.  Admit date: 07/20/2016 Discharge date: 07/25/2016  Admission Diagnoses:Acute appendicitis   Discharge Diagnoses: Same, shortness of breath, hypokalemia Active Problems:   Appendicitis Insulin-dependent diabetes mellitus  Discharged Condition: fair  Hospital Course: Patient is a 70 year old black female who presented emergency room with worsening lower abdominal pain. CT scan of the abdomen revealed acute appendicitis. She was taken to the operating room on 07/21/2016 and underwent laparoscopic appendectomy. She tolerated the procedure well. Her postoperative course was remarkable for shortness of breath on exertion. She states that she was having some dyspnea prior to coming to the hospital. EKG, BNP, and troponin were all within normal limits. CT angiogram of the chest was negative for pulmonary embolus. Dr. Luan Pulling of pulmonology was consulted. 2-D echo showed a normal ejection fraction. She was diuresed. She is seemingly better today. She would like to go home. Patient will be discharged on 07/25/2016 in fair and improving condition. She was given strict instructions that if her shortness of breath worsened, she should either contact her primary care physician or return to the emergency room.  Consults: pulmonary/intensive care   Treatments: surgery: Laparoscopic appendectomy on 07/21/2016  Discharge Exam: Blood pressure (!) 109/49, pulse 72, temperature 98.5 F (36.9 C), temperature source Oral, resp. rate 16, height 5' 2"  (1.575 m), weight 85.5 kg (188 lb 7.9 oz), SpO2 99 %. General appearance: alert, cooperative and no distress Resp: clear to auscultation bilaterally Cardio: regular rate and rhythm, S1, S2 normal, no murmur, click, rub or gallop GI: Soft, incisions healing well.  Disposition: 01-Home or Self Care     Medication List    TAKE these medications   atorvastatin  40 MG tablet Commonly known as:  LIPITOR Take 1 tablet (40 mg total) by mouth daily.   balsalazide 750 MG capsule Commonly known as:  COLAZAL TAKE THREE CAPSULES BY MOUTH THREE TIMES DAILY   dicyclomine 10 MG capsule Commonly known as:  BENTYL TAKE ONE CAPSULE BY MOUTH 4 TIMES DAILY WITH MEALS AND AT BEDTIME AS NEEDED FOR  CRAMPS  AND  DIARRHEA   glipiZIDE 10 MG tablet Commonly known as:  GLUCOTROL Take 1 tablet (10 mg total) by mouth daily.   glucose blood test strip Commonly known as:  ONETOUCH VERIO Test bid. DX E11.9   HYDROcodone-acetaminophen 5-325 MG tablet Commonly known as:  NORCO Take 1 tablet by mouth every 4 (four) hours as needed for moderate pain.   insulin NPH Human 100 UNIT/ML injection Commonly known as:  NOVOLIN N RELION Inject 26 units in the morning and then 15 units at night   Insulin Syringes (Disposable) U-100 0.5 ML Misc Give insulin twice daily as directed   lisinopril-hydrochlorothiazide 20-12.5 MG tablet Commonly known as:  PRINZIDE,ZESTORETIC Take 1 tablet by mouth daily.   multivitamin tablet Take 1 tablet by mouth daily.   Potassium Chloride ER 20 MEQ Tbcr Take 20 mEq by mouth 2 (two) times daily.      Follow-up Information    Jamesetta So, MD. Schedule an appointment as soon as possible for a visit on 07/30/2016.   Specialty:  General Surgery Contact information: [id number] [place] [address] [phone]           Signed: Aviva Signs A 07/25/2016, 8:46 AM

## 2016-07-25 NOTE — Discharge Instructions (Signed)
Shortness of Breath, Adult Shortness of breath is when a person has trouble breathing enough air, or when a person feels like she or he is having trouble breathing in enough air. Shortness of breath could be a sign of medical problem. Follow these instructions at home: Pay attention to any changes in your symptoms. Take these actions to help with your condition:  Do not smoke. Smoking is a common cause of shortness of breath. If you smoke and you need help quitting, ask your health care provider.  Avoid things that can irritate your airways, such as:  Mold.  Dust.  Air pollution.  Chemical fumes.  Things that can cause allergy symptoms (allergens), if you have allergies.  Keep your living space clean and free of mold and dust.  Rest as needed. Slowly return to your usual activities.  Take over-the-counter and prescription medicines, including oxygen and inhaled medicines, only as told by your health care provider.  Keep all follow-up visits as told by your health care provider. This is important. Contact a health care provider if:  Your condition does not improve as soon as expected.  You have a hard time doing your normal activities, even after you rest.  You have new symptoms. Get help right away if:  Your shortness of breath gets worse.  You have shortness of breath when you are resting.  You feel light-headed or you faint.  You have a cough that is not controlled with medicines.  You cough up blood.  You have pain with breathing.  You have pain in your chest, arms, shoulders, or abdomen.  You have a fever.  You cannot walk up stairs or exercise the way that you normally do. This information is not intended to replace advice given to you by your health care provider. Make sure you discuss any questions you have with your health care provider. Document Released: 04/23/2001 Document Revised: 02/17/2016 Document Reviewed: 01/04/2016 Elsevier Interactive  Patient Education  2017 [employer] Laparoscopic Appendectomy, Adult, Care After Refer to this sheet in the next few weeks. These instructions provide you with information about caring for yourself after your procedure. Your health care provider may also give you more specific instructions. Your treatment has been planned according to current medical practices, but problems sometimes occur. Call your health care provider if you have any problems or questions after your procedure. What can I expect after the procedure? After the procedure, it is common to have:  A decrease in your energy level.  Mild pain in the area where the surgical cuts (incisions) were made.  Constipation. This can be caused by pain medicine and a decrease in your activity. Follow these instructions at home: Medicines  Take over-the-counter and prescription medicines only as told by your health care provider.  Do not drive for 24 hours if you received a sedative.  Do not drive or operate heavy machinery while taking prescription pain medicine.  If you were prescribed an antibiotic medicine, take it as told by your health care provider. Do not stop taking the antibiotic even if you start to feel better. Activity  For 3 weeks or as long as told by your health care provider:  Do not lift anything that is heavier than 10 pounds (4.5 kg).  Do not play contact sports.  Gradually return to your normal activities. Ask your health care provider what activities are safe for you. Bathing  Keep your incisions clean and dry. Clean them as often as told by  your health care provider:  Gently wash the incisions with soap and water.  Rinse the incisions with water to remove all soap.  Pat the incisions dry with a clean towel. Do not rub the incisions.  You may take showers after 48 hours.  Do not take baths, swim, or use hot tubs for 2 weeks or as told by your health care provider. Incision care  Follow  instructions from your healthcare provider about how to take care of your incisions. Make sure you:  Wash your hands with soap and water before you change your bandage (dressing). If soap and water are not available, use hand sanitizer.  Change your dressing as told by your health care provider.  Leave stitches (sutures), skin glue, or adhesive strips in place. These skin closures may need to stay in place for 2 weeks or longer. If adhesive strip edges start to loosen and curl up, you may trim the loose edges. Do not remove adhesive strips completely unless your health care provider tells you to do that.  Check your incision areas every day for signs of infection. Check for:  More redness, swelling, or pain.  More fluid or blood.  Warmth.  Pus or a bad smell. Other Instructions  If you were sent home with a drain, follow instructions from your health care provider about how to care for the drain and how to empty it.  Take deep breaths. This helps to prevent your lungs from becoming inflamed.  To relieve and prevent constipation:  Drink plenty of fluids.  Eat plenty of fruits and vegetables.  Keep all follow-up visits as told by your health care provider. This is important. Contact a health care provider if:  You have more redness, swelling, or pain around an incision.  You have more fluid or blood coming from an incision.  Your incision feels warm to the touch.  You have pus or a bad smell coming from an incision or dressing.  Your incision edges break open after your sutures have been removed.  You have increasing pain in your shoulders.  You feel dizzy or you faint.  You develop shortness of breath.  You keep feeling nauseous or vomiting.  You have diarrhea or you cannot control your bowel functions.  You lose your appetite.  You develop swelling or pain in your legs. Get help right away if:  You have a fever.  You develop a rash.  You have difficulty  breathing.  You have sharp pains in your chest. This information is not intended to replace advice given to you by your health care provider. Make sure you discuss any questions you have with your health care provider. Document Released: 07/29/2005 Document Revised: 12/29/2015 Document Reviewed: 01/16/2015 Elsevier Interactive Patient Education  2017 [employer].

## 2016-07-25 NOTE — Progress Notes (Signed)
Patient's oxygen dropped down in to the 70s, increased oxygen back to 2l. Oxygen saturation increased to the 90s.

## 2016-07-25 NOTE — Progress Notes (Signed)
Patient with orders to be discharge home. Discharge instructions given, patient verbalized understanding. Prescriptions given. Patient stable. Patient left in private vehicle with family.

## 2016-09-03 ENCOUNTER — Telehealth: Payer: Self-pay

## 2016-09-03 NOTE — Telephone Encounter (Signed)
That's awful. Can we check on what's happening? Maybe a PA?

## 2016-09-03 NOTE — Telephone Encounter (Signed)
Pt called office and wanted AB to know that she went to pharmacy to pick-up Balsalazide rx and the cost was going to be $149 for 1 month, she usually pays $6-7. Lady at the pharmacy didn't know why the cost was so much more than usual. Advised pt I would send message to AB.

## 2016-09-09 NOTE — Telephone Encounter (Signed)
I have left a message at the pharmacy to let me know what is going on.

## 2016-09-12 NOTE — Telephone Encounter (Signed)
Called pharmacy again, had to leave a message.

## 2016-09-17 ENCOUNTER — Ambulatory Visit (INDEPENDENT_AMBULATORY_CARE_PROVIDER_SITE_OTHER): Payer: Medicare HMO | Admitting: Family Medicine

## 2016-09-17 ENCOUNTER — Encounter: Payer: Self-pay | Admitting: Family Medicine

## 2016-09-17 VITALS — BP 123/72 | HR 81 | Temp 97.1°F | Ht 62.0 in | Wt 184.0 lb

## 2016-09-17 DIAGNOSIS — E785 Hyperlipidemia, unspecified: Secondary | ICD-10-CM | POA: Diagnosis not present

## 2016-09-17 DIAGNOSIS — I10 Essential (primary) hypertension: Secondary | ICD-10-CM | POA: Diagnosis not present

## 2016-09-17 DIAGNOSIS — E119 Type 2 diabetes mellitus without complications: Secondary | ICD-10-CM

## 2016-09-17 DIAGNOSIS — M779 Enthesopathy, unspecified: Secondary | ICD-10-CM

## 2016-09-17 DIAGNOSIS — M25531 Pain in right wrist: Secondary | ICD-10-CM | POA: Diagnosis not present

## 2016-09-17 LAB — BAYER DCA HB A1C WAIVED: HB A1C (BAYER DCA - WAIVED): 7.6 % — ABNORMAL HIGH (ref <7.0)

## 2016-09-17 MED ORDER — BALSALAZIDE DISODIUM 750 MG PO CAPS: 2250.0000 mg | ORAL_CAPSULE | Freq: Three times a day (TID) | ORAL | 3 refills | Status: DC

## 2016-09-17 MED ORDER — GLIPIZIDE 10 MG PO TABS: 10.0000 mg | ORAL_TABLET | Freq: Every day | ORAL | 3 refills | Status: DC

## 2016-09-17 MED ORDER — LISINOPRIL-HYDROCHLOROTHIAZIDE 20-12.5 MG PO TABS: 1.0000 | ORAL_TABLET | Freq: Every day | ORAL | 3 refills | Status: DC

## 2016-09-17 MED ORDER — INSULIN NPH (HUMAN) (ISOPHANE) 100 UNIT/ML ~~LOC~~ SUSP: SUBCUTANEOUS | 11 refills | Status: DC

## 2016-09-17 MED ORDER — DICYCLOMINE HCL 10 MG PO CAPS: ORAL_CAPSULE | ORAL | 3 refills | Status: DC

## 2016-09-17 MED ORDER — ATORVASTATIN CALCIUM 40 MG PO TABS: 40.0000 mg | ORAL_TABLET | Freq: Every day | ORAL | 3 refills | Status: DC

## 2016-09-17 MED ORDER — NYSTATIN 100000 UNIT/GM EX POWD: Freq: Four times a day (QID) | CUTANEOUS | 1 refills | Status: DC

## 2016-09-17 NOTE — Patient Instructions (Signed)
Medicare Annual Wellness Visit  [place] and the medical providers at [facility] strive to bring you the best medical care.  In doing so we not only want to address your current medical conditions and concerns but also to detect new conditions early and prevent illness, disease and health-related problems.    Medicare offers a yearly Wellness Visit which allows our clinical staff to assess your need for preventative services including immunizations, lifestyle education, counseling to decrease risk of preventable diseases and screening for fall risk and other medical concerns.    This visit is provided free of charge (no copay) for all Medicare recipients. The clinical pharmacists at [facility] have begun to conduct these Wellness Visits which will also include a thorough review of all your medications.    As you primary medical provider recommend that you make an appointment for your Annual Wellness Visit if you have not done so already this year.  You may set up this appointment before you leave today or you may call back ([phone]) and schedule an appointment.  Please make sure when you call that you mention that you are scheduling your Annual Wellness Visit with the clinical pharmacist so that the appointment may be made for the proper length of time.     Continue current medications. Continue good therapeutic lifestyle changes which include good diet and exercise. Fall precautions discussed with patient. If an FOBT was given today- please return it to our front desk. If you are over 50 years old - you may need Prevnar 13 or the adult Pneumonia vaccine.  **Flu shots are available--- please call and schedule a FLU-CLINIC appointment**  After your visit with us today you will receive a survey in the mail or online from [employer] regarding your care with us. Please take a moment to fill this out. Your feedback is very  important to us as you can help us better understand your patient needs as well as improve your experience and satisfaction. WE CARE ABOUT YOU!!!    

## 2016-09-17 NOTE — Progress Notes (Addendum)
Subjective:    Patient ID: Felicia Pugh, female    DOB: [birth year], 71 y.o.   MRN: [id number]  HPI Pt here for follow up and management of chronic medical problems which includes diabetes, hyperlipidemia and hypertension. She is taking medication regularly. She had an appendectomy December and had a complication of dyspnea. She was seen in consultation by pulmonary who did an echo within diuresed her. She's had no issues with her breathing since discharge from the hospital about 2 months ago. Sugars continue to be high. She still eats bread is at his best tolerated with her Crohn's disease. Health insurance company will no longer cover Novolin N but will cover Humulin and so we will switch her. Also complains of some pain in her right wrist thumb area as well as a rash under her breasts.      Patient Active Problem List   Diagnosis Date Noted  . Appendicitis 07/21/2016  . Loss of weight 06/21/2015  . Vertigo 05/16/2015  . HLD (hyperlipidemia) 05/16/2015  . Type 2 diabetes mellitus without complication, without long-term current use of insulin ([place]) 05/16/2015  . Crohn's disease with complication ([place]) 42/68/3419  . Crohn's disease ([place]) 04/12/2015  . Mucosal abnormality of colon   . Diverticulosis of colon without hemorrhage   . Anemia 02/28/2015  . History of stroke 07/08/2014  . Osteopenia 05/11/2014  . Diabetes mellitus without complication ([place]) 62/22/9798  . Hyperlipidemia with target LDL less than 100 06/01/2008  . Anemia, chronic disease 06/01/2008  . Essential hypertension 06/01/2008  . HEMORRHOIDS, INTERNAL 06/01/2008  . IRRITABLE BOWEL SYNDROME 06/01/2008  . HEMOCCULT POSITIVE STOOL 06/01/2008  . 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 WEIGHT GAIN 06/01/2008  . FECAL INCONTINENCE 06/01/2008  . DIARRHEA 06/01/2008   Outpatient Encounter Prescriptions as of 09/17/2016  Medication Sig  . atorvastatin (LIPITOR) 40 MG tablet Take 1 tablet (40 mg total) by mouth daily.  .  balsalazide (COLAZAL) 750 MG capsule TAKE THREE CAPSULES BY MOUTH THREE TIMES DAILY  . dicyclomine (BENTYL) 10 MG capsule TAKE ONE CAPSULE BY MOUTH 4 TIMES DAILY WITH MEALS AND AT BEDTIME AS NEEDED FOR  CRAMPS  AND  DIARRHEA  . glipiZIDE (GLUCOTROL) 10 MG tablet Take 1 tablet (10 mg total) by mouth daily.  [name] glucose blood (ONETOUCH VERIO) test strip Test bid. DX E11.9  . insulin NPH Human (NOVOLIN N RELION) 100 UNIT/ML injection Inject 26 units in the morning and then 15 units at night  . Insulin Syringes, Disposable, U-100 0.5 ML MISC Give insulin twice daily as directed  . lisinopril-hydrochlorothiazide (PRINZIDE,ZESTORETIC) 20-12.5 MG tablet Take 1 tablet by mouth daily.  . Multiple Vitamin (MULTIVITAMIN) tablet Take 1 tablet by mouth daily.  . [DISCONTINUED] HYDROcodone-acetaminophen (NORCO) 5-325 MG tablet Take 1 tablet by mouth every 4 (four) hours as needed for moderate pain.  . [DISCONTINUED] potassium chloride 20 MEQ TBCR Take 20 mEq by mouth 2 (two) times daily.   No facility-administered encounter medications on file as of 09/17/2016.      Review of Systems  Constitutional: Negative.   HENT: Negative.   Eyes: Negative.   Respiratory: Negative.   Cardiovascular: Negative.   Gastrointestinal: Negative.   Endocrine: Negative.   Genitourinary: Negative.   Musculoskeletal: Positive for arthralgias (right thumb pain).  Skin: Positive for rash (under breast).  Allergic/Immunologic: Negative.   Neurological: Negative.   Hematological: Negative.   Psychiatric/Behavioral: Negative.        Objective:   Physical Exam  Constitutional: She is oriented to  person, place, and time. She appears well-developed and well-nourished.  Cardiovascular: Normal rate, regular rhythm and normal heart sounds.   Pulmonary/Chest: Effort normal and breath sounds normal.  Musculoskeletal: She exhibits no edema.  There is tenderness right radial wrist. This likely represents de Quervain's or  tenosynovitis. Wrist was injected with Depo-Medrol and Marcaine between the tendons at the level of the wrist. She was also given a thumb spica splint to wear.  Neurological: She is alert and oriented to person, place, and time.  Skin: There is erythema (under breasts bilaterally).  Psychiatric: She has a normal mood and affect. Her behavior is normal.   BP 123/72 (BP Location: Left Arm)   Pulse 81   Temp 97.1 F (36.2 C) (Oral)   Ht 5' 2"  (1.575 m)   Wt 184 lb (83.5 kg)   BMI 33.65 kg/m        Assessment & Plan:  1. Diabetes mellitus without complication ([place]) We will switch to Humulin in at same doses. If A1c is still elevated may need to adjust - Bayer DCA Hb A1c Waived  2. Essential hypertension Blood pressure is okay at 123/72  3. Hyperlipidemia with target LDL less than 100 With diabetes need to control LDL as best we can  Wardell Honour MD

## 2016-09-18 NOTE — Telephone Encounter (Signed)
I also spoke with Franciso Bend to see if the patient could receive a tier approval as last year 2017.  I went through the approval process over the phone and it was approved for the entire year of 2018.  Tier exception was approved from a Tier 4 to a Tier 1 and she should pay what she paid last year.  I made patient aware of this.

## 2016-09-18 NOTE — Telephone Encounter (Signed)
I spoke with Felicia Pugh at [facility] and she said that [place] has moved to a Tier 4 medication as of 08/12/2016.

## 2016-09-18 NOTE — Telephone Encounter (Signed)
Patient uses [facility] in [place].  She wanted Almyra Free to know she did pay $149 on 09/02/16, however she can't continue to pay that.  I spoke with Tiffany, pharmacy tech at [facility] in [place] and she stated it may be due to the fact she has not met her deductible.

## 2016-09-18 NOTE — Telephone Encounter (Signed)
I also spoke with Franciso Bend to see if the patient could receive a tier approval as last year 2017.  I went through the approval process over the phone and it was approved for the entire year of 2018.  I made patient aware of this.

## 2016-09-19 DIAGNOSIS — H2513 Age-related nuclear cataract, bilateral: Secondary | ICD-10-CM | POA: Diagnosis not present

## 2016-09-19 DIAGNOSIS — Z0100 Encounter for examination of eyes and vision without abnormal findings: Secondary | ICD-10-CM | POA: Diagnosis not present

## 2016-09-19 DIAGNOSIS — E119 Type 2 diabetes mellitus without complications: Secondary | ICD-10-CM | POA: Diagnosis not present

## 2016-09-19 DIAGNOSIS — Z7984 Long term (current) use of oral hypoglycemic drugs: Secondary | ICD-10-CM | POA: Diagnosis not present

## 2016-09-19 DIAGNOSIS — Z794 Long term (current) use of insulin: Secondary | ICD-10-CM | POA: Diagnosis not present

## 2016-10-07 DIAGNOSIS — K5090 Crohn's disease, unspecified, without complications: Secondary | ICD-10-CM | POA: Diagnosis not present

## 2016-10-07 DIAGNOSIS — E663 Overweight: Secondary | ICD-10-CM | POA: Diagnosis not present

## 2016-10-07 DIAGNOSIS — H8149 Vertigo of central origin, unspecified ear: Secondary | ICD-10-CM | POA: Diagnosis not present

## 2016-10-07 DIAGNOSIS — I69915 Cognitive social or emotional deficit following unspecified cerebrovascular disease: Secondary | ICD-10-CM | POA: Diagnosis not present

## 2016-10-07 DIAGNOSIS — I10 Essential (primary) hypertension: Secondary | ICD-10-CM | POA: Diagnosis not present

## 2016-10-07 DIAGNOSIS — Z0000 Encounter for general adult medical examination without abnormal findings: Secondary | ICD-10-CM | POA: Diagnosis not present

## 2016-10-07 DIAGNOSIS — E7800 Pure hypercholesterolemia, unspecified: Secondary | ICD-10-CM | POA: Diagnosis not present

## 2016-10-07 DIAGNOSIS — Z6833 Body mass index (BMI) 33.0-33.9, adult: Secondary | ICD-10-CM | POA: Diagnosis not present

## 2016-10-07 DIAGNOSIS — E119 Type 2 diabetes mellitus without complications: Secondary | ICD-10-CM | POA: Diagnosis not present

## 2016-10-07 DIAGNOSIS — D509 Iron deficiency anemia, unspecified: Secondary | ICD-10-CM | POA: Diagnosis not present

## 2016-10-21 ENCOUNTER — Telehealth: Payer: Self-pay | Admitting: Internal Medicine

## 2016-10-21 ENCOUNTER — Ambulatory Visit: Payer: Medicare HMO | Admitting: Gastroenterology

## 2016-10-21 NOTE — Telephone Encounter (Signed)
Routing to the refill box. 

## 2016-10-21 NOTE — Telephone Encounter (Signed)
Pt rescheduled her OV from today to Wednesday due to weather. She is out of her medicine and asked if we could call in a refill to [facility] in [place]. Balsalazide.

## 2016-10-22 DIAGNOSIS — R69 Illness, unspecified: Secondary | ICD-10-CM | POA: Diagnosis not present

## 2016-10-22 MED ORDER — BALSALAZIDE DISODIUM 750 MG PO CAPS: 2250.0000 mg | ORAL_CAPSULE | Freq: Three times a day (TID) | ORAL | 3 refills | Status: DC

## 2016-10-22 NOTE — Addendum Note (Signed)
Addended by: Annitta Needs on: 10/22/2016 02:23 PM   Modules accepted: Orders

## 2016-10-22 NOTE — Telephone Encounter (Signed)
Completed.

## 2016-10-23 ENCOUNTER — Ambulatory Visit (INDEPENDENT_AMBULATORY_CARE_PROVIDER_SITE_OTHER): Payer: Medicare HMO | Admitting: Gastroenterology

## 2016-10-23 ENCOUNTER — Encounter: Payer: Self-pay | Admitting: Gastroenterology

## 2016-10-23 VITALS — BP 134/80 | HR 87 | Temp 97.8°F | Ht 62.5 in | Wt 183.6 lb

## 2016-10-23 DIAGNOSIS — K5010 Crohn's disease of large intestine without complications: Secondary | ICD-10-CM | POA: Diagnosis not present

## 2016-10-23 LAB — IRON AND TIBC
%SAT: 38 % (ref 11–50)
Iron: 103 ug/dL (ref 45–160)
TIBC: 272 ug/dL (ref 250–450)
UIBC: 169 ug/dL (ref 125–400)

## 2016-10-23 LAB — CBC WITH DIFFERENTIAL/PLATELET
Basophils Absolute: 76 cells/uL (ref 0–200)
Basophils Relative: 1 %
EOS PCT: 1 %
Eosinophils Absolute: 76 cells/uL (ref 15–500)
HCT: 34.8 % — ABNORMAL LOW (ref 35.0–45.0)
HEMOGLOBIN: 11.1 g/dL — AB (ref 11.7–15.5)
LYMPHS ABS: 1824 {cells}/uL (ref 850–3900)
LYMPHS PCT: 24 %
MCH: 28.2 pg (ref 27.0–33.0)
MCHC: 31.9 g/dL — AB (ref 32.0–36.0)
MCV: 88.3 fL (ref 80.0–100.0)
MPV: 8.7 fL (ref 7.5–12.5)
Monocytes Absolute: 532 cells/uL (ref 200–950)
Monocytes Relative: 7 %
NEUTROS PCT: 67 %
Neutro Abs: 5092 cells/uL (ref 1500–7800)
PLATELETS: 308 10*3/uL (ref 140–400)
RBC: 3.94 MIL/uL (ref 3.80–5.10)
RDW: 14.6 % (ref 11.0–15.0)
WBC: 7.6 10*3/uL (ref 3.8–10.8)

## 2016-10-23 LAB — FERRITIN: FERRITIN: 261 ng/mL (ref 20–288)

## 2016-10-23 NOTE — Assessment & Plan Note (Signed)
71 year old female maintained on balsalazide, doing well until she ran out of her medication recently. I would like to recheck CBC now to assess for evolving IDA. History of chronic anemia, with last iron studies normal. If worsening, needs colonoscopy to re-evaluate disease process. Consider hematology referral as well, as she mentions seeing Dr. Sonny Dandy in the remote past for "injections" but does not remember what it was. Need DEXA scan now as well. Return in 6 months but may need colonoscopy in near future. Will review labs first.

## 2016-10-23 NOTE — Progress Notes (Signed)
cc'ed to pcp °

## 2016-10-23 NOTE — Patient Instructions (Signed)
Please have blood work done today. I or someone else from this office will call with an update of whether we need a colonoscopy or further evaluation.   I have ordered a bone scan for routine purposes.  We will definitely see you in 6 months if not sooner.   Let me know if you have any issues in the meantime!

## 2016-10-23 NOTE — Progress Notes (Signed)
Referring Provider: Wardell Honour, MD Primary Care Physician:  Wardell Honour, MD Primary GI: Dr. Gala Romney   Chief Complaint  Patient presents with  . Diarrhea    ran out of Balsalazide-had no refill  . Abdominal Pain    relieved after bm    HPI:   Felicia Pugh is a 71 y.o. female presenting today with a history of Crohn's colitis, insurance not improving Lialda. She is on Colazal. Chronic anemia, normal iron/ferritin. Has taken Bentyl as needed for possible IBS overlay. Last colonoscopy in 2016 with abnormal cecum and IC valve most consistent with IBD. TI intubated and appeared normal. CBC 3 months ago with Hgb 9.7 (during appendectomy). Baseline Hgb is 10/11. Needs DEXA.   Started to run out of Colazal last week and started having more frequent stool. No rectal bleeding. On iron pills. Dr. Sabra Heck placed on iron pills. Had 3 loose stools today but not watery, no rectal bleeding. Will have vague upper abdominal discomfort prior to BM. Feels like it started after cutting back on pills. Taking Bentyl QID. Bowel habits had been really good for awhile.   Past Medical History:  Diagnosis Date  . 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 Cataract   . Complication of anesthesia    difficulty breathing after waking up from anesthesia  . Crohn's disease ([place])   . Diabetes mellitus without complication ([place])   . Diabetic neuropathy ([place])   . Hyperlipidemia   . Hypertension   . IBS (irritable bowel syndrome)   . Stroke [facility]) 2011  . Syncope and collapse   . Thyroid nodule   . Ulcer [facility])     Past Surgical History:  Procedure Laterality Date  . ABDOMINAL HYSTERECTOMY     partial  . BACK SURGERY     x3 (Dr Loyola Mast, Dr Patrice Paradise)  . BREAST SURGERY Right 2002   cyst removed  . COLONOSCOPY  10/102006   RMR: normal  . COLONOSCOPY  09/06/2009   RMR: Suboptimal prep on the right side. Ulcers noted at cecum and in the descending  colon. Terminal ileum could not be intubated. Ounces with chronic active colitis, question Crohn's  . COLONOSCOPY N/A 03/27/2015   Dr. Gala Romney: abnormal cecum and IC valve most consistent with IBD. TI intubated and appeared normal. Likely Crohn's disease. Patient denies NSAID use. No improvement with Lialda samples at time of initial diagnosis  . ESOPHAGOGASTRODUODENOSCOPY    . LAPAROSCOPIC APPENDECTOMY N/A 07/21/2016   Procedure: APPENDECTOMY LAPAROSCOPIC;  Surgeon: Aviva Signs, MD;  Location: [facility];  Service: General;  Laterality: N/A;  . Small bowel capsule endoscopy  06/10/2005   RMR: single erosion versus AVM, distal ileum  . Small bowel capsule endoscopy  prior to 05/2005   [place]: reported erosions/ulcerations per medical record. actual report unavailable.    Current Outpatient Prescriptions  Medication Sig Dispense Refill  . atorvastatin (LIPITOR) 40 MG tablet Take 1 tablet (40 mg total) by mouth daily. 90 tablet 3  . balsalazide (COLAZAL) 750 MG capsule Take 3 capsules (2,250 mg total) by mouth 3 (three) times daily. 270 capsule 3  . dicyclomine (BENTYL) 10 MG capsule TAKE ONE CAPSULE BY MOUTH 4 TIMES DAILY WITH MEALS AND AT BEDTIME AS NEEDED FOR  CRAMPS  AND  DIARRHEA 120 capsule 3  . Ferrous Sulfate (IRON) 325 (65 Fe) MG TABS Take 1 tablet by mouth daily.    [name] glipiZIDE (GLUCOTROL)  10 MG tablet Take 1 tablet (10 mg total) by mouth daily. 90 tablet 3  . glucose blood (ONETOUCH VERIO) test strip Test bid. DX E11.9 100 each 5  . insulin NPH Human (HUMULIN N) 100 UNIT/ML injection Inject 26 u in the morning and 15 u at night 15 mL 11  . Insulin Syringes, Disposable, U-100 0.5 ML MISC Give insulin twice daily as directed 100 each 1  . lisinopril-hydrochlorothiazide (PRINZIDE,ZESTORETIC) 20-12.5 MG tablet Take 1 tablet by mouth daily. 90 tablet 3  . Multiple Vitamin (MULTIVITAMIN) tablet Take 1 tablet by mouth daily.    [name] nystatin (MYCOSTATIN/NYSTOP) powder Apply topically 4 (four) times  daily. (Patient not taking: Reported on 10/23/2016) 15 g 1   No current facility-administered medications for this visit.     Allergies as of 10/23/2016 - Review Complete 10/23/2016  Allergen Reaction Noted  . Metformin and related  05/11/2014  . Asa [aspirin]  11/04/2012  . Nsaids  11/04/2012  . Other Diarrhea 12/25/2014    Family History  Problem Relation Age of Onset  . Hypertension Mother   . Stroke Mother   . Diabetes Father     w/ BKA  . Hypertension Father   . Cancer Sister   . Diabetes Sister     stomach  . Diabetes Brother   . Diabetes Sister   . Cancer Sister     breast  . Hypertension Sister   . Colon cancer Neg Hx   . Inflammatory bowel disease Neg Hx     Social History   Social History  . Marital status: Married    Spouse name: N/A  . Number of children: 5  . Years of education: N/A   Social History Main Topics  . Smoking status: Never Smoker  . Smokeless tobacco: Never Used  . Alcohol use No  . Drug use: No  . Sexual activity: No   Other Topics Concern  . None   Social History Narrative  . None    Review of Systems: As mentioned in HPI   Physical Exam: BP 134/80   Pulse 87   Temp 97.8 F (36.6 C) (Oral)   Ht 5' 2.5" (1.588 m)   Wt 183 lb 9.6 oz (83.3 kg)   BMI 33.05 kg/m  General:   Alert and oriented. No distress noted. Pleasant and cooperative.  Head:  Normocephalic and atraumatic. Eyes:  Conjuctiva clear without scleral icterus. Mouth:  Oral mucosa pink and moist. Good dentition. No lesions. Neck:  Supple, without mass or thyromegaly. Heart:  S1, S2 present without murmurs Abdomen:  +BS, soft, non-tender and non-distended. No rebound or guarding. No HSM or masses noted. Msk:  Symmetrical without gross deformities. Normal posture. Extremities:  Without edema. Neurologic:  Alert and  oriented x4 Psych:  Alert and cooperative. Normal mood and affect.

## 2016-10-29 ENCOUNTER — Ambulatory Visit (HOSPITAL_COMMUNITY)
Admission: RE | Admit: 2016-10-29 | Discharge: 2016-10-29 | Disposition: A | Discharge status: Home / Self Care | Payer: Medicare HMO | Source: Ambulatory Visit | Attending: Gastroenterology | Admitting: Gastroenterology

## 2016-10-29 DIAGNOSIS — M8588 Other specified disorders of bone density and structure, other site: Secondary | ICD-10-CM | POA: Insufficient documentation

## 2016-10-29 DIAGNOSIS — M85852 Other specified disorders of bone density and structure, left thigh: Secondary | ICD-10-CM | POA: Diagnosis not present

## 2016-10-29 DIAGNOSIS — K5010 Crohn's disease of large intestine without complications: Secondary | ICD-10-CM | POA: Diagnosis present

## 2016-11-01 NOTE — Progress Notes (Signed)
Hgb improved to baseline, ferritin and iron great. I hope she is doing better since restarting her meds (she had been out a few days). I sent a note in my chart.

## 2016-11-04 NOTE — Progress Notes (Signed)
She has osteopenia. Needs to take Calcium 1200 mg/day and Vit D 800 units per day (this can be found over the counter in something like Caltrate, Oscal, etc). Repeat DEXA in 2 years.

## 2016-11-05 NOTE — Progress Notes (Signed)
ON RECALL  °

## 2016-11-07 ENCOUNTER — Encounter: Payer: Self-pay | Admitting: Gastroenterology

## 2016-12-03 ENCOUNTER — Telehealth: Payer: Self-pay | Admitting: Family Medicine

## 2016-12-03 NOTE — Telephone Encounter (Signed)
Scheduled on mobile unit

## 2016-12-16 ENCOUNTER — Ambulatory Visit (INDEPENDENT_AMBULATORY_CARE_PROVIDER_SITE_OTHER): Payer: Medicare HMO | Admitting: Family Medicine

## 2016-12-16 ENCOUNTER — Telehealth: Payer: Self-pay | Admitting: Family Medicine

## 2016-12-16 ENCOUNTER — Encounter: Payer: Self-pay | Admitting: Family Medicine

## 2016-12-16 VITALS — BP 127/72 | HR 73 | Temp 97.0°F | Ht 62.5 in | Wt 186.0 lb

## 2016-12-16 DIAGNOSIS — M654 Radial styloid tenosynovitis [de Quervain]: Secondary | ICD-10-CM

## 2016-12-16 DIAGNOSIS — E785 Hyperlipidemia, unspecified: Secondary | ICD-10-CM | POA: Diagnosis not present

## 2016-12-16 DIAGNOSIS — D638 Anemia in other chronic diseases classified elsewhere: Secondary | ICD-10-CM | POA: Diagnosis not present

## 2016-12-16 DIAGNOSIS — I10 Essential (primary) hypertension: Secondary | ICD-10-CM

## 2016-12-16 DIAGNOSIS — Z1159 Encounter for screening for other viral diseases: Secondary | ICD-10-CM | POA: Diagnosis not present

## 2016-12-16 DIAGNOSIS — Z23 Encounter for immunization: Secondary | ICD-10-CM | POA: Diagnosis not present

## 2016-12-16 DIAGNOSIS — E119 Type 2 diabetes mellitus without complications: Secondary | ICD-10-CM

## 2016-12-16 LAB — BAYER DCA HB A1C WAIVED: HB A1C (BAYER DCA - WAIVED): 8.3 % — ABNORMAL HIGH (ref <7.0)

## 2016-12-16 MED ORDER — INSULIN GLARGINE 100 UNIT/ML ~~LOC~~ SOLN: 16.0000 [IU] | Freq: Every day | SUBCUTANEOUS | 11 refills | Status: DC

## 2016-12-16 MED ORDER — SITAGLIPTIN PHOSPHATE 100 MG PO TABS: 100.0000 mg | ORAL_TABLET | Freq: Every day | ORAL | 3 refills | Status: DC

## 2016-12-16 MED ORDER — INSULIN DETEMIR 100 UNIT/ML ~~LOC~~ SOLN: 16.0000 [IU] | Freq: Every day | SUBCUTANEOUS | 5 refills | Status: DC

## 2016-12-16 NOTE — Progress Notes (Signed)
BP 127/72   Pulse 73   Temp 97 F (36.1 C) (Oral)   Ht 5' 2.5" (1.588 m)   Wt 186 lb (84.4 kg)   BMI 33.48 kg/m    Subjective:    Patient ID: Felicia Pugh, female    DOB: 1945/11/19, 71 y.o.   MRN: [id number]  HPI: Felicia Pugh is a 71 y.o. female presenting on 12/16/2016 for Diabetes (followup); Hyperlipidemia; Hypertension; and Pain in right wrist area (Dr. Sabra Heck did cortisone injection at last OV, did not help)   HPI Type 2 diabetes mellitus Patient comes in today for recheck of his diabetes. Patient has been currently taking Glipizide and Levemir and Januvia. Patient is currently on an ACE inhibitor. Patient has not seen an ophthalmologist this year. Patient denies any issues with his feet.   Hyperlipidemia Patient is coming in for recheck of his hyperlipidemia. He is currently taking Lipitor. He denies any issues with myalgias or history of liver damage from it. He denies any focal numbness or weakness or chest pain.  Hypertension Patient is coming in for blood pressure recheck and her blood pressure today is 127/72. She is currently on lisinopril-hydrochlorothiazide. Patient denies headaches, blurred vision, chest pains, shortness of breath, or weakness. Denies any side effects from medication and is content with current medication.   Anemia/chronic Patient is coming in for recheck of her chronic anemia for which she had been taking iron for but has not been recently and wants to see where she is at. She's had a lot of issues with blood loss in her stool that they have not figured out why it has been happening.  Recurrent wrist pain Patient has been having issues with recurrent wrist pain on the lateral aspect of her wrist near the base of her thumb that she had an injection for last month but did not seem to help. She has been using Tylenol and heating pads which helped some. She denies any fevers or chills or redness or warmth or numbness or weakness. She does not say that  it is worsening but it is just not improving.  Relevant past medical, surgical, family and social history reviewed and updated as indicated. Interim medical history since our last visit reviewed. Allergies and medications reviewed and updated.  Review of Systems  Constitutional: Positive for fatigue. Negative for chills and fever.  HENT: Negative for congestion, ear discharge and ear pain.   Eyes: Negative for redness and visual disturbance.  Respiratory: Negative for chest tightness and shortness of breath.   Cardiovascular: Negative for chest pain and leg swelling.  Genitourinary: Negative for difficulty urinating and dysuria.  Musculoskeletal: Positive for arthralgias. Negative for back pain and gait problem.  Skin: Negative for rash.  Neurological: Negative for dizziness, light-headedness and headaches.  Psychiatric/Behavioral: Negative for agitation and behavioral problems.  All other systems reviewed and are negative.   Per HPI unless specifically indicated above        Objective:    BP 127/72   Pulse 73   Temp 97 F (36.1 C) (Oral)   Ht 5' 2.5" (1.588 m)   Wt 186 lb (84.4 kg)   BMI 33.48 kg/m   Wt Readings from Last 3 Encounters:  12/16/16 186 lb (84.4 kg)  10/23/16 183 lb 9.6 oz (83.3 kg)  09/17/16 184 lb (83.5 kg)    Physical Exam  Constitutional: She is oriented to person, place, and time. She appears well-developed and well-nourished. No distress.  Eyes: Conjunctivae are  normal.  Neck: Neck supple. No thyromegaly present.  Cardiovascular: Normal rate, regular rhythm, normal heart sounds and intact distal pulses.   No murmur heard. Pulmonary/Chest: Effort normal and breath sounds normal. No respiratory distress. She has no wheezes. She has no rales.  Musculoskeletal: Normal range of motion. She exhibits no edema.       Right wrist: She exhibits tenderness (Tenderness overlying the lateral aspect just proximal to the thumb, consistent with de Quervain's  tenosynovitis). She exhibits normal range of motion, no swelling and no effusion.  Lymphadenopathy:    She has no cervical adenopathy.  Neurological: She is alert and oriented to person, place, and time. Coordination normal.  Skin: Skin is warm and dry. No rash noted. She is not diaphoretic.  Psychiatric: She has a normal mood and affect. Her behavior is normal.  Nursing note and vitals reviewed.     Assessment & Plan:   Problem List Items Addressed This Visit      Cardiovascular and Mediastinum   Essential hypertension - Primary   Relevant Orders   CMP14+EGFR (Completed)     Endocrine   Type 2 diabetes mellitus without complication, without long-term current use of insulin (HCC)   Relevant Medications   sitaGLIPtin (JANUVIA) 100 MG tablet   Other Relevant Orders   Bayer DCA Hb A1c Waived (Completed)   CMP14+EGFR (Completed)     Other   Hyperlipidemia with target LDL less than 100   Relevant Orders   Lipid panel (Completed)   Anemia, chronic disease   Relevant Orders   CBC with Differential/Platelet (Completed)    Other Visit Diagnoses    Need for hepatitis C screening test       Relevant Orders   Hepatitis C antibody (Completed)   De Quervain's disease (tenosynovitis)       Just had an injection last month, recommended change of motions and Tylenol and heating pads       Follow up plan: Return in about 3 months (around 03/18/2017), or if symptoms worsen or fail to improve, for Recheck diabetes, also do a referral to [facility].  Counseling provided for all of the vaccine components Orders Placed This Encounter  Procedures  . Pneumococcal polysaccharide vaccine 23-valent greater than or equal to 2yo subcutaneous/IM  . Bayer DCA Hb A1c Waived  . CMP14+EGFR  . Lipid panel  . CBC with Differential/Platelet  . Hepatitis C antibody    Felicia Pina, MD [facility] 12/16/2016, 9:42 AM

## 2016-12-16 NOTE — Telephone Encounter (Signed)
Pt aware of the change

## 2016-12-17 ENCOUNTER — Telehealth: Payer: Self-pay | Admitting: Family Medicine

## 2016-12-17 LAB — CBC WITH DIFFERENTIAL/PLATELET
BASOS: 1 %
Basophils Absolute: 0 10*3/uL (ref 0.0–0.2)
EOS (ABSOLUTE): 0.1 10*3/uL (ref 0.0–0.4)
Eos: 1 %
Hematocrit: 35.4 % (ref 34.0–46.6)
Hemoglobin: 11.5 g/dL (ref 11.1–15.9)
Immature Grans (Abs): 0 10*3/uL (ref 0.0–0.1)
Immature Granulocytes: 0 %
LYMPHS ABS: 2 10*3/uL (ref 0.7–3.1)
Lymphs: 25 %
MCH: 28.2 pg (ref 26.6–33.0)
MCHC: 32.5 g/dL (ref 31.5–35.7)
MCV: 87 fL (ref 79–97)
MONOS ABS: 0.6 10*3/uL (ref 0.1–0.9)
Monocytes: 8 %
NEUTROS ABS: 5.1 10*3/uL (ref 1.4–7.0)
Neutrophils: 65 %
PLATELETS: 332 10*3/uL (ref 150–379)
RBC: 4.08 x10E6/uL (ref 3.77–5.28)
RDW: 14.3 % (ref 12.3–15.4)
WBC: 7.8 10*3/uL (ref 3.4–10.8)

## 2016-12-17 LAB — CMP14+EGFR
ALT: 10 IU/L (ref 0–32)
AST: 19 IU/L (ref 0–40)
Albumin/Globulin Ratio: 1.4 (ref 1.2–2.2)
Albumin: 4.2 g/dL (ref 3.5–4.8)
Alkaline Phosphatase: 139 IU/L — ABNORMAL HIGH (ref 39–117)
BUN/Creatinine Ratio: 20 (ref 12–28)
BUN: 15 mg/dL (ref 8–27)
Bilirubin Total: 0.3 mg/dL (ref 0.0–1.2)
CO2: 27 mmol/L (ref 18–29)
CREATININE: 0.76 mg/dL (ref 0.57–1.00)
Calcium: 9.6 mg/dL (ref 8.7–10.3)
Chloride: 101 mmol/L (ref 96–106)
GFR calc Af Amer: 92 mL/min/{1.73_m2} (ref >59)
GFR calc non Af Amer: 80 mL/min/{1.73_m2} (ref >59)
GLUCOSE: 70 mg/dL (ref 65–99)
Globulin, Total: 3 g/dL (ref 1.5–4.5)
Potassium: 4 mmol/L (ref 3.5–5.2)
Sodium: 144 mmol/L (ref 134–144)
Total Protein: 7.2 g/dL (ref 6.0–8.5)

## 2016-12-17 LAB — LIPID PANEL
Chol/HDL Ratio: 2.5 ratio (ref 0.0–4.4)
Cholesterol, Total: 184 mg/dL (ref 100–199)
HDL: 74 mg/dL (ref >39)
LDL Calculated: 97 mg/dL (ref 0–99)
Triglycerides: 65 mg/dL (ref 0–149)
VLDL Cholesterol Cal: 13 mg/dL (ref 5–40)

## 2016-12-17 LAB — HEPATITIS C ANTIBODY: Hep C Virus Ab: <0.1 {s_co_ratio} (ref 0.0–0.9)

## 2016-12-17 NOTE — Telephone Encounter (Signed)
Patient aware of lab results.

## 2016-12-18 ENCOUNTER — Telehealth: Payer: Self-pay | Admitting: Family Medicine

## 2016-12-18 NOTE — Telephone Encounter (Signed)
I believe we went ahead and sent in a refill for her insulin NPH that she was on before. If we did not go ahead and send a refill for that.

## 2016-12-18 NOTE — Telephone Encounter (Signed)
Unfortunately there are none of them are new medications that are cheaper than Januvia. She will just have to keep her old medications that she was on for now until things change.

## 2016-12-18 NOTE — Telephone Encounter (Signed)
Levemir is too expensive. Please advise.

## 2016-12-18 NOTE — Telephone Encounter (Signed)
Patient states she has plenty of refills on her NPH insulin and will use that. States her Celesta Gentile is $40 and would like to know if something else can be called in. Please advise

## 2016-12-18 NOTE — Telephone Encounter (Signed)
lmtcb

## 2016-12-19 NOTE — Telephone Encounter (Signed)
Patient aware and verbalizes understanding. 

## 2016-12-23 ENCOUNTER — Telehealth: Payer: Self-pay

## 2016-12-23 NOTE — Telephone Encounter (Signed)
Pt called office and said her diarrhea is getting worse. It has been going on for about 2 months but worsening. Her stool is watery. She had diarrhea 7 times today. Sometimes has cramps at times at upper abdomen when she needs to have bm. No fever. No antibiotics. She has been taking Imodium and it helps but then she has diarrhea the next morning. Stool is dark and she takes Iron, but hasn't seen any blood.

## 2016-12-24 NOTE — Telephone Encounter (Signed)
We need to check stool studies to include Cdiff, giardia, culture. If these are negative, will prescribe a round of entocort. Let's also get her back in the office in about 4 weeks.

## 2016-12-25 ENCOUNTER — Other Ambulatory Visit: Payer: Self-pay

## 2016-12-25 DIAGNOSIS — R197 Diarrhea, unspecified: Secondary | ICD-10-CM

## 2016-12-25 NOTE — Telephone Encounter (Signed)
Tried to call pt, no answer, LMOAM and informed her to come by our office to pick up stool kits/lab orders. Asked her to call our office to schedule OV.

## 2016-12-30 ENCOUNTER — Ambulatory Visit (INDEPENDENT_AMBULATORY_CARE_PROVIDER_SITE_OTHER): Payer: Medicare HMO | Admitting: Pharmacist

## 2016-12-30 ENCOUNTER — Encounter: Payer: Self-pay | Admitting: Pharmacist

## 2016-12-30 VITALS — BP 122/68 | HR 70 | Ht 63.0 in | Wt 186.0 lb

## 2016-12-30 DIAGNOSIS — E1140 Type 2 diabetes mellitus with diabetic neuropathy, unspecified: Secondary | ICD-10-CM

## 2016-12-30 DIAGNOSIS — Z794 Long term (current) use of insulin: Secondary | ICD-10-CM

## 2016-12-30 DIAGNOSIS — R197 Diarrhea, unspecified: Secondary | ICD-10-CM | POA: Diagnosis not present

## 2016-12-30 DIAGNOSIS — Z79899 Other long term (current) drug therapy: Secondary | ICD-10-CM

## 2016-12-30 NOTE — Patient Instructions (Addendum)
Continuous Glucose monitor was placed today to try to get better understanding of blood glucose trends.  CGM was placed on the back of upper left arm.  You will wear at least 5 days and up to 14 days.  Keep BG, diet and insulin administration records. You can engage in regular activities with special care when showering, changing clothes and swimming (only up to 3 feet and for 30 minutes at a time)  Start Januvia 141m - take 1 tablet daily on Monday, May 28th.   Diabetes and Standards of Medical Care   Diabetes is complicated. You may find that your diabetes team includes a dietitian, nurse, diabetes educator, eye doctor, and more. To help everyone know what is going on and to help you get the care you deserve, the following schedule of care was developed to help keep you on track. Below are the tests, exams, vaccines, medicines, education, and plans you will need.  Blood Glucose Goals Prior to meals = 80 - 130 Within 2 hours of the start of a meal = less than 180  HbA1c test (goal is less than 7.0% - your last value was 8.3%) This test shows how well you have controlled your glucose over the past 2 to 3 months. It is used to see if your diabetes management plan needs to be adjusted.   It is performed at least 2 times a year if you are meeting treatment goals.  It is performed 4 times a year if therapy has changed or if you are not meeting treatment goals.  Blood pressure test  This test is performed at every routine medical visit. The goal is less than 140/90 mmHg for most people, but 130/80 mmHg in some cases. Ask your health care provider about your goal.  Dental exam  Follow up with the dentist regularly.  Eye exam  If you are diagnosed with type 1 diabetes as a child, get an exam upon reaching the age of 172years or older and have had diabetes for 3 to 5 years. Yearly eye exams are recommended after that initial eye exam.  If you are diagnosed with type 1 diabetes as an adult,  get an exam within 5 years of diagnosis and then yearly.  If you are diagnosed with type 2 diabetes, get an exam as soon as possible after the diagnosis and then yearly.  Foot care exam  Visual foot exams are performed at every routine medical visit. The exams check for cuts, injuries, or other problems with the feet.  A comprehensive foot exam should be done yearly. This includes visual inspection as well as assessing foot pulses and testing for loss of sensation.  Check your feet nightly for cuts, injuries, or other problems with your feet. Tell your health care provider if anything is not healing.  Kidney function test (urine microalbumin)  This test is performed once a year.  Type 1 diabetes: The first test is performed 5 years after diagnosis.  Type 2 diabetes: The first test is performed at the time of diagnosis.  A serum creatinine and estimated glomerular filtration rate (eGFR) test is done once a year to assess the level of chronic kidney disease (CKD), if present.  Lipid profile (cholesterol, HDL, LDL, triglycerides)  Performed every 5 years for most people.  The goal for LDL is less than 100 mg/dL. If you are at high risk, the goal is less than 70 mg/dL.  The goal for HDL is 40 mg/dL to 50 mg/dL  for men and 50 mg/dL to 60 mg/dL for women. An HDL cholesterol of 60 mg/dL or higher gives some protection against heart disease.  The goal for triglycerides is less than 150 mg/dL.  Influenza vaccine, pneumococcal vaccine, and hepatitis B vaccine  The influenza vaccine is recommended yearly.  The pneumococcal vaccine is generally given once in a lifetime. However, there are some instances when another vaccination is recommended. Check with your health care provider.  The hepatitis B vaccine is also recommended for adults with diabetes.  Diabetes self-management education  Education is recommended at diagnosis and ongoing as needed.  Treatment plan  Your treatment plan  is reviewed at every medical visit.  Document Released: 05/26/2009 Document Revised: 03/31/2013 Document Reviewed: 12/29/2012 [facility] Patient Information 2014 [place].

## 2016-12-30 NOTE — Progress Notes (Signed)
Patient ID: Felicia Pugh, female   DOB: September 12, 1945, 71 y.o.   MRN: [id number]   Subjective:    Felicia Pugh is a 71 y.o. female who presents for an inital evaluation of Type 2 diabetes mellitus. She has seen Memory Argue, clinical pharmacist in the past but last visit was 2015.   Current symptoms/problems include hyperglycemia and have been unchanged. Symptoms have been present for 3 months.  Current diabetic medications include:  Novolin N insulin - injection 26 units qam and 15 units qpm and glipizide 17m qd Felicia Pugh Crohn's disease and has not been able to tolerate metformin in past.   At her last visit with Dr Dettinger he switched Novolin N to [employer] Levemir but the copay at the pharmacy was over $100 so she restarted Novolon NPH.  He also prescribed Januvia 1050mqd but copay was $45 and patient could not afford so she did not start.  Current monitoring regimen: home blood tests - 1 times daily Home blood sugar records: am BG = 150, 76, 111, 93, 194.  pm BG = 70's to 90's, once was 201 Any episodes of hypoglycemia? no  Known diabetic complications: peripheral neuropathy and cerebrovascular disease Cardiovascular risk factors: advanced age (older than 5574or men, 6578or women), diabetes mellitus, dyslipidemia, hypertension and sedentary lifestyle Eye exam current (within one year): yes Weight trend: stable Prior visit with CDE: she has seen MiRose Fillersn past but last visit was in 2015 Current diet: patient tries to limit high CHO foods but when she has a flare of Crohn's only high CHO foods seem to help - crackers, potatoes, etc Current exercise: none but she is active with her grandchildren and church Medication Compliance? No - not taking 2 meds recommended due to cost   Is She on ACE inhibitor or angiotensin II receptor blocker?  Yes  lisinopril (Prinivil)     Objective:    BP 122/68   Pulse 70   Ht 5' 3"  (1.6 m)   Wt 186 lb (84.4 kg)   BMI  32.95 kg/m   Lab Review Glucose (mg/dL)  Date Value  12/16/2016 70   A1c = 8.3% (12/16/2016)  BUN (mg/dL)  Date Value  12/16/2016 15  07/25/2016 11  07/23/2016 14  07/22/2016 19  03/06/2016 21  11/29/2015 17    Creatinine, Ser (mg/dL)  Date Value  12/16/2016 0.76  07/25/2016 0.75  07/23/2016 0.72      Assessment:    Diabetes Mellitus type 2 requiring insulin therapy  under inadequate control.    Plan:    1.  Rx changes: patient was given #21 samples of Januvia.     Continue current Novolin NPH - 26u qam and 15u qpm 2.  Determined that it is highly likely that patient and her husband would qualify for LIS / extra assistance from Medicaid.  Paperwork and phone number to apply are given to patient. 3. Continuous Glucose monitor was placed today to try to get better understanding of BG trends.  Patient educated about proper care of CGM and site.  CGM was placed on back of upper left arm.  Patient will wear at least 5 days and up to 14 days.  He is to keep BG, diet and insulin administration records. He can engage in regular activities with special care when showering, changing clothes and swimming (only up to 3 feet and for 30 minutes at a time) 4. CHO counting diet discussed.  Reviewed CHO amount  in various foods and how to read nutrition labels.  Discussed recommended serving sizes.  5.  Recommend check BG 2  times a day 6.  Recommended increase physical activity / walking as able - goal is 150  Minutes per week 7. Follow up: 2 weeks to remove CGM and review report

## 2017-01-02 LAB — GIARDIA ANTIGEN

## 2017-01-03 LAB — STOOL CULTURE

## 2017-01-04 LAB — CLOSTRIDIUM DIFFICILE CULTURE-FECAL

## 2017-01-07 ENCOUNTER — Other Ambulatory Visit: Payer: Self-pay | Admitting: Gastroenterology

## 2017-01-07 MED ORDER — BUDESONIDE 3 MG PO CPEP: 9.0000 mg | ORAL_CAPSULE | Freq: Every day | ORAL | 1 refills | Status: DC

## 2017-01-07 NOTE — Progress Notes (Signed)
Stool studies are negative. I am sending in entocort for 4 weeks with 1 refill; we will need to taper her off of this. She needs an office visit in 2 weeks at the most to see how she is doing.

## 2017-01-09 NOTE — Progress Notes (Signed)
Patient went to pick up the Entocort and it's $260 and she can't afford.  Routing to [facility] for alternate Rx

## 2017-01-09 NOTE — Progress Notes (Signed)
Routing to [place]

## 2017-01-09 NOTE — Progress Notes (Signed)
Entocort sent in, routing to [place] to schedule appt in 2 weeks.

## 2017-01-09 NOTE — Progress Notes (Signed)
MADE APPOINTMENT AND NOTIFIED PATIENT

## 2017-01-13 ENCOUNTER — Encounter: Payer: Self-pay | Admitting: Pharmacist

## 2017-01-13 ENCOUNTER — Ambulatory Visit (INDEPENDENT_AMBULATORY_CARE_PROVIDER_SITE_OTHER): Payer: Medicare HMO | Admitting: Pharmacist

## 2017-01-13 VITALS — BP 138/70 | HR 76 | Ht 63.0 in | Wt 187.0 lb

## 2017-01-13 DIAGNOSIS — E1140 Type 2 diabetes mellitus with diabetic neuropathy, unspecified: Secondary | ICD-10-CM | POA: Diagnosis not present

## 2017-01-13 DIAGNOSIS — Z794 Long term (current) use of insulin: Secondary | ICD-10-CM | POA: Diagnosis not present

## 2017-01-13 NOTE — Progress Notes (Signed)
Patient ID: Felicia Pugh, female   DOB: 05/27/1946, 71 y.o.   MRN: [id number]   Subjective:    Felicia Pugh is a 71 y.o. female who presents for an follow up evaluation of Type 2 diabetes mellitus requiring insulin therapy.  Two weeks ago we placed a the professional Libre CGM. Mrs. Dejarnette is here today to have CGM removed and to discussed results.   Current diabetic medications include insulin NPH 26 units qam and 15 units qpm.  started Januvia 150m 1 week ago (1 week into starting CGM).   CMG Report (total of 15 days of data): Average daily BG = 108 45% of time BG was in target  30% of times BG was below target 25% of times BG was above target  Glucose Trends:  During the first week (prior to starting Januvia) elevated post prandial BG about 13 times.  ALso have 6 episodes of hypoglycemia During the second week (after starting Januvia) elevated post prandial BG 3 times.  Had 11 episodes of hypoglycemia  Current monitoring regimen: home blood tests - 1-2 times daily Home blood sugar records: fasting range: 42-181  Known diabetic complications: peripheral neuropathy and cerebrovascular disease Cardiovascular risk factors: advanced age (older than 530for men, 629for women), diabetes mellitus, dyslipidemia, hypertension and obesity (BMI >= 30 kg/m2) Current diet: in general, a "healthy" diet  ; over last 2 weeks she has been limiting CHO intake Current exercise: walking - 20 minutes 3 days last week Medication Compliance?  Yes   Is She on ACE inhibitor or angiotensin II receptor blocker?  Yes  lisinopril (Zestril)    Objective:    BP 138/70   Pulse 76   Ht 5' 3"  (1.6 m)   Wt 187 lb (84.8 kg)   BMI 33.13 kg/m    Last A1c = 8.3% (12/16/2016)   Lab Review Glucose (mg/dL)  Date Value  12/16/2016 70    BUN (mg/dL)  Date Value  12/16/2016 15  07/25/2016 11  07/23/2016 14  07/22/2016 19  03/06/2016 21  11/29/2015 17   Creatinine, Ser (mg/dL)  Date Value   12/16/2016 0.76  07/25/2016 0.75  07/23/2016 0.72      Assessment:    Diabetes Mellitus type II, under inadequate control post prandial BG is improving but having lots of hypoglycemia.    Plan:    1.  Rx changes: decrease insulin NPH to 20 units qam and 10 units qpm   Continue Januvia 1063mqd 2.  Education: Reviewed s/s of hypoglycemia and how to treat 3. CHO counting diet discussed.  Reviewed CHO amount in various foods and how to read nutrition labels.  Discussed recommended serving sizes.  4.  Recommend check BG at least 2  times a day 5.  Recommended continue to walk daily 6. CGM report reviewed with patient and PCP and above changes made. 7.  Follow up: 2 months  PCP

## 2017-01-13 NOTE — Patient Instructions (Signed)
Call me to adjust Insulin if you have more than 2 blood glucose / sugar readings less than 70 in 1 week.  Decrease NPH insulin to 20 units each morning and 10 units each evening Continue Januvia 166m take 1 tablet daily  Hypoglycemia Hypoglycemia occurs when the level of sugar (glucose) in the blood is too low. Glucose is a type of sugar that provides the body's main source of energy. Certain hormones (insulin and glucagon) control the level of glucose in the blood. Insulin lowers blood glucose, and glucagon increases blood glucose. Hypoglycemia can result from having too much insulin in the bloodstream, or from not eating enough food that contains glucose. Hypoglycemia can happen in people who do or do not have diabetes. It can develop quickly, and it can be a medical emergency. What are the causes? Hypoglycemia occurs most often in people who have diabetes. If you have diabetes, hypoglycemia may be caused by:  Diabetes medicine. More likely to occur with insulin.  Not eating enough, or not eating often enough.  Increased physical activity.  Drinking alcohol, especially when you have not eaten recently. What increases the risk? Hypoglycemia is more likely to develop in:  People who have diabetes and take medicines to lower blood glucose.  People who abuse alcohol.  People who have a severe illness. What are the signs or symptoms? Hypoglycemia may not cause any symptoms. If you have symptoms, they may include:  Hunger.  Anxiety.  Sweating and feeling clammy.  Confusion.  Dizziness or feeling light-headed.  Sleepiness.  Nausea.  Increased heart rate.  Headache.  Blurry vision.  Seizure.  Nightmares.  Tingling or numbness around the mouth, lips, or tongue.  A change in speech.  Decreased ability to concentrate.  A change in coordination.  Restless sleep.  Tremors or shakes.  Fainting.  Irritability. How is this treated Low Blood glucose (Blood  glucose less than 70)? This condition can often be treated by immediately eating or drinking something that contains glucose / sugar, such as:  3-4 sugar tablets (glucose pills).  Glucose gel, 15-gram tube.  Fruit juice, 4 oz (120 mL).  Regular soda (not diet soda), 4 oz (120 mL).  Low-fat milk, 4 oz (120 mL).  Several pieces of hard candy.  Sugar or honey, 1 Tbsp. Treating Hypoglycemia If You Have Diabetes  If you are alert and able to swallow safely, follow the 15:15 rule:  Take 15 grams of a rapid-acting carbohydrate.  Rapid-acting options include:  1 tube of glucose gel.  3 glucose pills.  6-8 pieces of hard candy.  4 oz (120 mL) of fruit juice.  4 oz (120 ml) of regular (not diet) soda.  Check your blood glucose 15 to 20 minutes after you take the carbohydrate.  If the repeat blood glucose level is still at or below 70 mg/dL (3.9 mmol/L), take 15 grams of a carbohydrate again.  If your blood glucose level does not increase above 70 mg/dL (3.9 mmol/L) after 3 tries, seek emergency medical care.  After your blood glucose level returns to normal, eat a meal or a snack within 1 hour. Treating Severe Hypoglycemia  Severe hypoglycemia is when your blood glucose level is at or below 54 mg/dL (3 mmol/L). Severe hypoglycemia is an emergency. Do not wait to see if the symptoms will go away. Get medical help right away. Call your local emergency services (911 in the [place] Do not drive yourself to the hospital. If you have severe hypoglycemia and you  cannot eat or drink, you may need an injection of glucagon. A family member or close friend should learn how to check your blood glucose and how to give you a glucagon injection. Ask your health care provider if you need to have an emergency glucagon injection kit available. Severe hypoglycemia may need to be treated in a hospital. The treatment may include getting glucose through an IV tube. You may also need treatment for the cause  of your hypoglycemia. Follow these instructions at home: General instructions  Avoid any diets that cause you to not eat enough food. Talk with your health care provider before you start any new diet.  Take over-the-counter and prescription medicines only as told by your health care provider.  Limit alcohol intake to no more than 1 drink per day for nonpregnant women and 2 drinks per day for men. One drink equals 12 oz of beer, 5 oz of wine, or 1 oz of hard liquor.  Keep all follow-up visits as told by your health care provider. This is important. If You Have Diabetes:   Make sure you know the symptoms of hypoglycemia.  Always have a rapid-acting carbohydrate snack with you to treat low blood sugar.  Follow your diabetes management plan, as told by your health care provider. Make sure you:  Take your medicines as directed.  Follow your exercise plan.  Follow your meal plan. Eat on time, and do not skip meals.  Check your blood glucose as often as directed. Make sure to check your blood glucose before and after exercise. If you exercise longer or in a different way than usual, check your blood glucose more often.  Follow your sick day plan whenever you cannot eat or drink normally. Make this plan in advance with your health care provider.  Share your diabetes management plan with people in your workplace, school, and household.  Check your urine for ketones when you are ill and as told by your health care provider.  Carry a medical alert card or wear medical alert jewelry. Contact a health care provider if:  You have problems keeping your blood glucose in your target range.  You have frequent episodes of hypoglycemia. (more than 1 episode per week) Get help right away if:  You continue to have hypoglycemia symptoms after eating or drinking something containing glucose.  Your blood glucose is at or below 54 mg/dL (3 mmol/L).  You have a seizure.  You faint. These  symptoms may represent a serious problem that is an emergency. Do not wait to see if the symptoms will go away. Get medical help right away. Call your local emergency services (911 in the [place] Do not drive yourself to the hospital.  This information is not intended to replace advice given to you by your health care provider. Make sure you discuss any questions you have with your health care provider.

## 2017-01-21 ENCOUNTER — Ambulatory Visit: Payer: Medicare HMO | Admitting: Gastroenterology

## 2017-01-21 ENCOUNTER — Other Ambulatory Visit: Payer: Self-pay | Admitting: Family Medicine

## 2017-01-22 ENCOUNTER — Ambulatory Visit (INDEPENDENT_AMBULATORY_CARE_PROVIDER_SITE_OTHER): Payer: Medicare HMO | Admitting: Gastroenterology

## 2017-01-22 ENCOUNTER — Encounter: Payer: Self-pay | Admitting: Gastroenterology

## 2017-01-22 VITALS — BP 123/79 | HR 82 | Temp 97.1°F | Ht 62.0 in | Wt 184.0 lb

## 2017-01-22 DIAGNOSIS — K50918 Crohn's disease, unspecified, with other complication: Secondary | ICD-10-CM

## 2017-01-22 DIAGNOSIS — R69 Illness, unspecified: Secondary | ICD-10-CM | POA: Diagnosis not present

## 2017-01-22 NOTE — Assessment & Plan Note (Signed)
71 year old female maintained on balsalazide historically but seems to be having a flare. Unable to afford commercially prepared entocort; however, [facility] can compound this to be more cost-effective. I discussed with her we need to consider pursuing updated colonoscopy to assess disease progression, as she may need escalation of therapy. Check CRP and sed rate today.    Entocort 9 mg (3 mg daily for 4 weeks), then 2 daily for 2 weeks, then 1 daily for 1 week, then 1 every other day called into [place]. One month supply should be 65$, which is much improved from the 300$ prescription price. I discussed wanting to avoid prednisone due to her history of diabetes and other troublesome side effects with prednisone. She is agreeable to this as well. Return in 4 weeks.

## 2017-01-22 NOTE — Patient Instructions (Signed)
Please have labs done today.  I am calling [facility] to see if they could compound the entocort, which may be less expensive. I would like to do this instead of prednisone if at all possible. They are not open now, but I will call you before the day is over with a plan!  If entocort is not cost-effective, we will have to do a very short course of prednisone.   I will see you in 4 weeks!

## 2017-01-22 NOTE — Progress Notes (Signed)
CC'ED TO PCP 

## 2017-01-22 NOTE — Progress Notes (Signed)
Referring Provider: Wardell Honour, MD Primary Care Physician:  Dettinger, Fransisca Kaufmann, MD Primary GI: Dr. Gala Romney   Chief Complaint  Patient presents with  . Diarrhea    HPI:   Felicia Pugh is a 71 y.o. female presenting today with a history of Crohn's, with last colonoscopy in 2016 with abnormal cecum and IC valve most consistent with IBD. TI intubated and appeared normal. Insurance did not approve Lialda and covered Colazal. She has chronic anemia with normal iron/ferritin. 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 Last seen in March 2018. DEXA on file this year. Called in to the office May 2018 with acute on chronic diarrhea, with negative stool studies. Prescribed round of entocort for flare for 4 weeks and here to discuss taper over next 2-4 weeks.   However, she wasn't able to pick this up as it was 300$. Has multiple loose stools a day. Drove to [facility] and had to stop 4 times on the way. On iron pills so stool is dark but no bright red blood. Has a little queasy feel with loose stools but not "all the time". A few days of being seen in March, the loose stools started again. Fruit and veggies cause more diarrhea. Sticks with more carbs, as this seems to settle better.   Past Medical History:  Diagnosis Date  . 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 Cataract   . Complication of anesthesia    difficulty breathing after waking up from anesthesia  . Crohn's disease ([place])   . Diabetes mellitus without complication ([place])   . Diabetic neuropathy ([place])   . Hyperlipidemia   . Hypertension   . IBS (irritable bowel syndrome)   . Stroke [facility] 2011  . Syncope and collapse   . Thyroid nodule   . Ulcer     Past Surgical History:  Procedure Laterality Date  . ABDOMINAL HYSTERECTOMY     partial  . BACK SURGERY     x3 (Dr Loyola Mast, Dr Patrice Paradise)  . BREAST SURGERY Right 2002   cyst removed  . COLONOSCOPY  10/102006   RMR: normal  . COLONOSCOPY  09/06/2009   RMR: Suboptimal prep on the right side. Ulcers noted at cecum and in the descending colon. Terminal ileum could not be intubated. Ounces with chronic active colitis, question Crohn's  . COLONOSCOPY N/A 03/27/2015   Dr. Gala Romney: abnormal cecum and IC valve most consistent with IBD. TI intubated and appeared normal. Likely Crohn's disease. Patient denies NSAID use. No improvement with Lialda samples at time of initial diagnosis  . ESOPHAGOGASTRODUODENOSCOPY    . LAPAROSCOPIC APPENDECTOMY N/A 07/21/2016   Procedure: APPENDECTOMY LAPAROSCOPIC;  Surgeon: Aviva Signs, MD;  Location: [facility];  Service: General;  Laterality: N/A;  . Small bowel capsule endoscopy  06/10/2005   RMR: single erosion versus AVM, distal ileum  . Small bowel capsule endoscopy  prior to 05/2005   [place]: reported erosions/ulcerations per medical record. actual report unavailable.    Current Outpatient Prescriptions  Medication Sig Dispense Refill  . atorvastatin (LIPITOR) 40 MG tablet Take 1 tablet (40 mg total) by mouth daily. 90 tablet 3  . balsalazide (COLAZAL) 750 MG capsule Take 3 capsules (2,250 mg total) by mouth 3 (three) times daily. 270 capsule 3  . calcium-vitamin D (OSCAL WITH D) 500-200 MG-UNIT tablet Take 1 tablet by mouth daily.     [name] dicyclomine (BENTYL) 10 MG  capsule TAKE ONE CAPSULE BY MOUTH 4 TIMES DAILY WITH MEALS AND AT BEDTIME AS NEEDED FOR  CRAMPS  AND  DIARRHEA 120 capsule 3  . Ferrous Sulfate (IRON) 325 (65 Fe) MG TABS Take 1 tablet by mouth daily.    . Insulin NPH Human, Isophane, (NOVOLIN N [place]) Inject 10-20 Units into the skin 2 (two) times daily.    . Insulin Syringes, Disposable, U-100 0.5 ML MISC Give insulin twice daily as directed 100 each 1  . lisinopril-hydrochlorothiazide (PRINZIDE,ZESTORETIC) 20-12.5 MG tablet Take 1 tablet by mouth daily. 90 tablet 3  . Multiple Vitamin (MULTIVITAMIN) tablet Take 1 tablet by mouth daily.    . sitaGLIPtin (JANUVIA) 100 MG  tablet Take 100 mg by mouth daily.    Glory Rosebush VERIO test strip USE 1 STRIP TO CHECK GLUCOSE TWICE DAILY 100 each 2   No current facility-administered medications for this visit.     Allergies as of 01/22/2017 - Review Complete 01/22/2017  Allergen Reaction Noted  . Metformin and related  05/11/2014  . Asa [aspirin]  11/04/2012  . Nsaids  11/04/2012  . Other Diarrhea 12/25/2014    Family History  Problem Relation Age of Onset  . Hypertension Mother   . Stroke Mother   . Diabetes Father        w/ BKA  . Hypertension Father   . Cancer Sister   . Diabetes Sister        stomach  . Diabetes Brother   . Diabetes Sister   . Cancer Sister        breast  . Hypertension Sister   . Colon cancer Neg Hx   . Inflammatory bowel disease Neg Hx     Social History   Social History  . Marital status: Married    Spouse name: N/A  . Number of children: 5  . Years of education: N/A   Social History Main Topics  . Smoking status: Never Smoker  . Smokeless tobacco: Never Used  . Alcohol use No  . Drug use: No  . Sexual activity: No   Other Topics Concern  . None   Social History Narrative  . None    Review of Systems: As mentioned in HPI   Physical Exam: BP 123/79   Pulse 82   Temp 97.1 F (36.2 C) (Oral)   Ht 5' 2"  (1.575 m)   Wt 184 lb (83.5 kg)   BMI 33.65 kg/m  General:   Alert and oriented. No distress noted. Pleasant and cooperative.  Head:  Normocephalic and atraumatic. Eyes:  Conjuctiva clear without scleral icterus. Abdomen:  +BS, soft, non-tender and non-distended. No rebound or guarding. No HSM or masses noted. Msk:  Symmetrical without gross deformities. Normal posture. Extremities:  Without edema. Neurologic:  Alert and  oriented x4;  grossly normal neurologically. Psych:  Alert and cooperative. Normal mood and affect.

## 2017-01-23 LAB — SEDIMENTATION RATE: SED RATE: 7 mm/h (ref 0–30)

## 2017-01-23 LAB — C-REACTIVE PROTEIN: CRP: 1.7 mg/L (ref <8.0)

## 2017-01-27 NOTE — Progress Notes (Signed)
I sent this to patient:  "Felicia Pugh: your inflammatory markers are normal. I hope you are doing better with the entocort!"

## 2017-02-03 ENCOUNTER — Telehealth: Payer: Self-pay | Admitting: Family Medicine

## 2017-02-03 NOTE — Telephone Encounter (Signed)
Patient states that her BG has improved - readings 150's to 170's.   She has restarted prednisone and that has lead to more 170's.   Instructed patient to continue current meds but if BG is over 200 then to call and we can adjust medicaiton as needed.

## 2017-02-05 ENCOUNTER — Encounter: Payer: Medicare HMO | Admitting: *Deleted

## 2017-02-05 DIAGNOSIS — Z1231 Encounter for screening mammogram for malignant neoplasm of breast: Secondary | ICD-10-CM | POA: Diagnosis not present

## 2017-02-11 ENCOUNTER — Telehealth: Payer: Self-pay | Admitting: Family Medicine

## 2017-02-11 MED ORDER — SITAGLIPTIN PHOSPHATE 100 MG PO TABS: 100.0000 mg | ORAL_TABLET | Freq: Every day | ORAL | 0 refills | Status: DC

## 2017-02-11 NOTE — Telephone Encounter (Signed)
#  21 samples left at front desk for patient

## 2017-02-13 ENCOUNTER — Ambulatory Visit (INDEPENDENT_AMBULATORY_CARE_PROVIDER_SITE_OTHER): Payer: Medicare HMO | Admitting: Physician Assistant

## 2017-02-13 VITALS — BP 155/83 | HR 79 | Temp 98.2°F | Ht 63.0 in | Wt 183.0 lb

## 2017-02-13 DIAGNOSIS — J069 Acute upper respiratory infection, unspecified: Secondary | ICD-10-CM | POA: Diagnosis not present

## 2017-02-13 MED ORDER — AMOXICILLIN-POT CLAVULANATE 875-125 MG PO TABS: 1.0000 | ORAL_TABLET | Freq: Two times a day (BID) | ORAL | 0 refills | Status: DC

## 2017-02-13 NOTE — Progress Notes (Signed)
Subjective:     Patient ID: Felicia Pugh, female   DOB: November 10, 1945, 71 y.o.   MRN: [id number]  HPI Pt with cough, congestion, and S/T for several days She has noted some recent elevation of her BS as well Currently of Prednisone through GI for her Chron's dz  Review of Systems  Constitutional: Positive for activity change, appetite change and fatigue.  HENT: Positive for congestion, postnasal drip, sore throat and voice change. Negative for rhinorrhea and sinus pressure.   Respiratory: Positive for cough. Negative for chest tightness and shortness of breath.   Cardiovascular: Negative.        Objective:   Physical Exam  Constitutional: She appears well-developed and well-nourished.  HENT:  Right Ear: External ear normal.  Left Ear: External ear normal.  Mouth/Throat: Oropharynx is clear and moist. No oropharyngeal exudate.  Neck: Neck supple.  Cardiovascular: Normal rate, regular rhythm and normal heart sounds.   No murmur heard. Pulmonary/Chest: Effort normal and breath sounds normal. No respiratory distress. She has no wheezes.  Lymphadenopathy:    She has no cervical adenopathy.  Nursing note and vitals reviewed. Prev BP readings have been nl     Assessment:     1. Upper respiratory infection, acute        Plan:     Fluids Rest Augmentin 876m bid with food Watch BP Keep f/u appt regarding diabetes

## 2017-02-13 NOTE — Patient Instructions (Signed)
Upper Respiratory Infection, Adult Most upper respiratory infections (URIs) are caused by a virus. A URI affects the nose, throat, and upper air passages. The most common type of URI is often called "the common cold." Follow these instructions at home:  Take medicines only as told by your doctor.  Gargle warm saltwater or take cough drops to comfort your throat as told by your doctor.  Use a warm mist humidifier or inhale steam from a shower to increase air moisture. This may make it easier to breathe.  Drink enough fluid to keep your pee (urine) clear or pale yellow.  Eat soups and other clear broths.  Have a healthy diet.  Rest as needed.  Go back to work when your fever is gone or your doctor says it is okay. ? You may need to stay home longer to avoid giving your URI to others. ? You can also wear a face mask and wash your hands often to prevent spread of the virus.  Use your inhaler more if you have asthma.  Do not use any tobacco products, including cigarettes, chewing tobacco, or electronic cigarettes. If you need help quitting, ask your doctor. Contact a doctor if:  You are getting worse, not better.  Your symptoms are not helped by medicine.  You have chills.  You are getting more short of breath.  You have brown or red mucus.  You have yellow or brown discharge from your nose.  You have pain in your face, especially when you bend forward.  You have a fever.  You have puffy (swollen) neck glands.  You have pain while swallowing.  You have white areas in the back of your throat. Get help right away if:  You have very bad or constant: ? Headache. ? Ear pain. ? Pain in your forehead, behind your eyes, and over your cheekbones (sinus pain). ? Chest pain.  You have long-lasting (chronic) lung disease and any of the following: ? Wheezing. ? Long-lasting cough. ? Coughing up blood. ? A change in your usual mucus.  You have a stiff neck.  You have  changes in your: ? Vision. ? Hearing. ? Thinking. ? Mood. This information is not intended to replace advice given to you by your health care provider. Make sure you discuss any questions you have with your health care provider. Document Released: 01/15/2008 Document Revised: 03/31/2016 Document Reviewed: 11/03/2013 Elsevier Interactive Patient Education  2018 Else[employer] Inc.  

## 2017-02-19 ENCOUNTER — Other Ambulatory Visit: Payer: Self-pay

## 2017-02-19 ENCOUNTER — Encounter: Payer: Self-pay | Admitting: Gastroenterology

## 2017-02-19 ENCOUNTER — Ambulatory Visit (INDEPENDENT_AMBULATORY_CARE_PROVIDER_SITE_OTHER): Payer: Medicare HMO | Admitting: Gastroenterology

## 2017-02-19 VITALS — BP 130/77 | HR 84 | Temp 97.1°F | Ht 63.0 in | Wt 181.8 lb

## 2017-02-19 DIAGNOSIS — K5090 Crohn's disease, unspecified, without complications: Secondary | ICD-10-CM | POA: Diagnosis not present

## 2017-02-19 DIAGNOSIS — K50919 Crohn's disease, unspecified, with unspecified complications: Secondary | ICD-10-CM

## 2017-02-19 MED ORDER — NA SULFATE-K SULFATE-MG SULF 17.5-3.13-1.6 GM/177ML PO SOLN: 1.0000 | ORAL | 0 refills | Status: DC

## 2017-02-19 NOTE — Assessment & Plan Note (Signed)
71 year old female with history of Crohn's disease and last colonoscopy in 2016 noting abnormal cecum and IC valve consistent with IBD. TI intubated and appeared normal. Insurance did not approve Lialda, which was prescribed. Difficulty with insurance coverage and only covered Colazal, which she has been taking. She is completing entocort taper currently and has not noticed much improvement with what was presumably a flare a month ago. Continues with persistent loose stool in the morning and has really not had the best management of chronic diarrhea for quite some time. Negative stool studies, TSH normal, negative celiac. Weight tends to fluctuate but I note that it is slowly creeping down recently. Imodium has offered some improvement historically. On Bentyl for supportive measures. I wonder if she has had progression of her disease and although CRP and sed rate are normal recently, she needs endoscopic evaluation for histological purposes.   Proceed with TCS with Dr. Gala Romney in near future: the risks, benefits, and alternatives have been discussed with the patient in detail. The patient states understanding and desires to proceed. Will follow-up with her in the office after colonoscopy Complete entocort taper as already outlined in previous note

## 2017-02-19 NOTE — Progress Notes (Signed)
Referring Provider: Dettinger, Fransisca Kaufmann, MD Primary Care Physician:  Dettinger, Fransisca Kaufmann, MD Primary GI: Dr. Gala Romney   Chief Complaint  Patient presents with  . Crohn's Disease    HPI:   Felicia Pugh is a 71 y.o. female presenting today with a history of Crohn's, with last colonoscopy in 2016 with abnormal cecum and IC valve most consistent with IBD. TI intubated and appeared normal. Insurance did not approve Lialda and covered Colazal. She has chronic anemia with normal iron/ferritin. 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 DEXA on file this year. At visit in June was given Entocort 9 mg for 4 weeks with taper thereafter, compounded by [employer]. May need to consider updated colonoscopy. CRP and sed rate normal recently.   Going multiple times to the bathroom in the morning. Went to the bathroom about 7 times on Sunday. No rectal bleeding. Dark on iron. Given Augmentin BID on 7/5 due to URI. Unable to take but for 2 days due to diarrhea. Mushy stool but not watery. No improvement with taking entocort. Is starting on the taper.   Used to have loose stool all day long, now just in the morning but still frequently. Urgency with bowel movements. Taking Bentyl as well. Imodium has helped historically. CT Dec 2017 prior to appendectomy with unremarkable small bowel. Previously her TSH has been normal. Negative celiac serologies.   Past Medical History:  Diagnosis Date  . 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 Cataract   . Complication of anesthesia    difficulty breathing after waking up from anesthesia  . Crohn's disease ([place])   . Diabetes mellitus without complication ([place])   . Diabetic neuropathy ([place])   . Hyperlipidemia   . Hypertension   . IBS (irritable bowel syndrome)   . Stroke [facility]) 2011  . Syncope and collapse   . Thyroid nodule   . Ulcer     Past Surgical History:  Procedure Laterality Date    . ABDOMINAL HYSTERECTOMY     partial  . BACK SURGERY     x3 (Dr Loyola Mast, Dr Patrice Paradise)  . BREAST SURGERY Right 2002   cyst removed  . COLONOSCOPY  10/102006   RMR: normal  . COLONOSCOPY  09/06/2009   RMR: Suboptimal prep on the right side. Ulcers noted at cecum and in the descending colon. Terminal ileum could not be intubated. Ounces with chronic active colitis, question Crohn's  . COLONOSCOPY N/A 03/27/2015   Dr. Gala Romney: abnormal cecum and IC valve most consistent with IBD. TI intubated and appeared normal. Likely Crohn's disease. Patient denies NSAID use. No improvement with Lialda samples at time of initial diagnosis  . ESOPHAGOGASTRODUODENOSCOPY    . LAPAROSCOPIC APPENDECTOMY N/A 07/21/2016   Procedure: APPENDECTOMY LAPAROSCOPIC;  Surgeon: Aviva Signs, MD;  Location: [facility];  Service: General;  Laterality: N/A;  . Small bowel capsule endoscopy  06/10/2005   RMR: single erosion versus AVM, distal ileum  . Small bowel capsule endoscopy  prior to 05/2005   [place]: reported erosions/ulcerations per medical record. actual report unavailable.    Current Outpatient Prescriptions  Medication Sig Dispense Refill  . atorvastatin (LIPITOR) 40 MG tablet Take 1 tablet (40 mg total) by mouth daily. 90 tablet 3  . balsalazide (COLAZAL) 750 MG capsule Take 3 capsules (2,250 mg total) by mouth 3 (three) times daily. 270 capsule 3  . budesonide (ENTOCORT EC) 3 MG 24  hr capsule     . calcium-vitamin D (OSCAL WITH D) 500-200 MG-UNIT tablet Take 1 tablet by mouth daily.     [name] dicyclomine (BENTYL) 10 MG capsule TAKE ONE CAPSULE BY MOUTH 4 TIMES DAILY WITH MEALS AND AT BEDTIME AS NEEDED FOR  CRAMPS  AND  DIARRHEA 120 capsule 3  . Ferrous Sulfate (IRON) 325 (65 Fe) MG TABS Take 1 tablet by mouth daily.    . Insulin NPH Human, Isophane, (NOVOLIN N [place]) Inject 10-20 Units into the skin 2 (two) times daily.    [name] lisinopril-hydrochlorothiazide (PRINZIDE,ZESTORETIC) 20-12.5 MG tablet Take 1 tablet by mouth daily. 90  tablet 3  . Multiple Vitamin (MULTIVITAMIN) tablet Take 1 tablet by mouth daily.    Glory Rosebush VERIO test strip USE 1 STRIP TO CHECK GLUCOSE TWICE DAILY 100 each 2  . sitaGLIPtin (JANUVIA) 100 MG tablet Take 1 tablet (100 mg total) by mouth daily. 21 tablet 0  . amoxicillin-clavulanate (AUGMENTIN) 875-125 MG tablet Take 1 tablet by mouth 2 (two) times daily. (Patient not taking: Reported on 02/19/2017) 20 tablet 0  . Insulin Syringes, Disposable, U-100 0.5 ML MISC Give insulin twice daily as directed 100 each 1   No current facility-administered medications for this visit.     Allergies as of 02/19/2017 - Review Complete 02/19/2017  Allergen Reaction Noted  . Metformin and related  05/11/2014  . Asa [aspirin]  11/04/2012  . Nsaids  11/04/2012  . Other Diarrhea 12/25/2014    Family History  Problem Relation Age of Onset  . Hypertension Mother   . Stroke Mother   . Diabetes Father        w/ BKA  . Hypertension Father   . Cancer Sister   . Diabetes Sister        stomach  . Diabetes Brother   . Diabetes Sister   . Cancer Sister        breast  . Hypertension Sister   . Colon cancer Neg Hx   . Inflammatory bowel disease Neg Hx     Social History   Social History  . Marital status: Married    Spouse name: N/A  . Number of children: 5  . Years of education: N/A   Social History Main Topics  . Smoking status: Never Smoker  . Smokeless tobacco: Never Used  . Alcohol use No  . Drug use: No  . Sexual activity: No   Other Topics Concern  . None   Social History Narrative  . None    Review of Systems: As mentioned in HPI   Physical Exam: BP 130/77   Pulse 84   Temp (!) 97.1 F (36.2 C) (Oral)   Ht 5' 3"  (1.6 m)   Wt 181 lb 12.8 oz (82.5 kg)   BMI 32.20 kg/m  General:   Alert and oriented. No distress noted. Pleasant and cooperative.  Head:  Normocephalic and atraumatic. Eyes:  Conjuctiva clear without scleral icterus. Mouth:  Oral mucosa pink and moist. Good  dentition. No lesions. Heart:  S1, S2 present without murmurs, rubs, or gallops. Regular rate and rhythm. Abdomen:  +BS, soft, non-tender and non-distended. No rebound or guarding. No HSM or masses noted. Msk:  Symmetrical without gross deformities. Normal posture. Extremities:  Without edema. Neurologic:  Alert and  oriented x4;  grossly normal neurologically. Psych:  Alert and cooperative. Normal mood and affect.  Lab Results  Component Value Date   WBC 7.8 12/16/2016   HGB 11.5 12/16/2016  HCT 35.4 12/16/2016   MCV 87 12/16/2016   PLT 332 12/16/2016   Lab Results  Component Value Date   IRON 103 10/23/2016   TIBC 272 10/23/2016   FERRITIN 261 10/23/2016   Lab Results  Component Value Date   CREATININE 0.76 12/16/2016   BUN 15 12/16/2016   NA 144 12/16/2016   K 4.0 12/16/2016   CL 101 12/16/2016   CO2 27 12/16/2016

## 2017-02-19 NOTE — Patient Instructions (Signed)
You may take Imodium as needed. However, do not take Imodium for several days leading up to the colonoscopy.  We have scheduled you for a colonoscopy to further evaluate your colon again and see if a different medication may be appropriate.   I will see you in follow-up afterwards.  Have a good summer!

## 2017-02-24 NOTE — Progress Notes (Signed)
CC'D TO PCP °

## 2017-03-12 DIAGNOSIS — Z0100 Encounter for examination of eyes and vision without abnormal findings: Secondary | ICD-10-CM | POA: Diagnosis not present

## 2017-03-17 ENCOUNTER — Emergency Department (HOSPITAL_COMMUNITY)
Admission: EM | Admit: 2017-03-17 | Discharge: 2017-03-17 | Disposition: A | Discharge status: Home / Self Care | Payer: Medicare HMO | Attending: Emergency Medicine | Admitting: Emergency Medicine

## 2017-03-17 ENCOUNTER — Telehealth: Payer: Self-pay

## 2017-03-17 ENCOUNTER — Emergency Department (HOSPITAL_COMMUNITY): Payer: Medicare HMO

## 2017-03-17 ENCOUNTER — Encounter (HOSPITAL_COMMUNITY): Payer: Self-pay | Admitting: Emergency Medicine

## 2017-03-17 ENCOUNTER — Other Ambulatory Visit: Payer: Self-pay

## 2017-03-17 DIAGNOSIS — I10 Essential (primary) hypertension: Secondary | ICD-10-CM | POA: Insufficient documentation

## 2017-03-17 DIAGNOSIS — Z8673 Personal history of transient ischemic attack (TIA), and cerebral infarction without residual deficits: Secondary | ICD-10-CM | POA: Diagnosis not present

## 2017-03-17 DIAGNOSIS — Z79899 Other long term (current) drug therapy: Secondary | ICD-10-CM | POA: Diagnosis not present

## 2017-03-17 DIAGNOSIS — Z794 Long term (current) use of insulin: Secondary | ICD-10-CM | POA: Insufficient documentation

## 2017-03-17 DIAGNOSIS — R0602 Shortness of breath: Secondary | ICD-10-CM | POA: Insufficient documentation

## 2017-03-17 DIAGNOSIS — E119 Type 2 diabetes mellitus without complications: Secondary | ICD-10-CM | POA: Diagnosis not present

## 2017-03-17 LAB — BASIC METABOLIC PANEL
Anion gap: 8 (ref 5–15)
BUN: 17 mg/dL (ref 6–20)
CALCIUM: 9.1 mg/dL (ref 8.9–10.3)
CO2: 30 mmol/L (ref 22–32)
CREATININE: 0.82 mg/dL (ref 0.44–1.00)
Chloride: 102 mmol/L (ref 101–111)
Glucose, Bld: 185 mg/dL — ABNORMAL HIGH (ref 65–99)
Potassium: 2.9 mmol/L — ABNORMAL LOW (ref 3.5–5.1)
SODIUM: 140 mmol/L (ref 135–145)

## 2017-03-17 LAB — CBC WITH DIFFERENTIAL/PLATELET
BASOS ABS: 0 10*3/uL (ref 0.0–0.1)
BASOS PCT: 0 %
Eosinophils Absolute: 0.1 10*3/uL (ref 0.0–0.7)
Eosinophils Relative: 1 %
HEMATOCRIT: 35.9 % — AB (ref 36.0–46.0)
Hemoglobin: 11.4 g/dL — ABNORMAL LOW (ref 12.0–15.0)
Lymphocytes Relative: 23 %
Lymphs Abs: 2.4 10*3/uL (ref 0.7–4.0)
MCH: 28.6 pg (ref 26.0–34.0)
MCHC: 31.8 g/dL (ref 30.0–36.0)
MCV: 90 fL (ref 78.0–100.0)
MONO ABS: 0.5 10*3/uL (ref 0.1–1.0)
MONOS PCT: 5 %
NEUTROS ABS: 7.4 10*3/uL (ref 1.7–7.7)
Neutrophils Relative %: 71 %
PLATELETS: 253 10*3/uL (ref 150–400)
RBC: 3.99 MIL/uL (ref 3.87–5.11)
RDW: 13.4 % (ref 11.5–15.5)
WBC: 10.5 10*3/uL (ref 4.0–10.5)

## 2017-03-17 LAB — TROPONIN I

## 2017-03-17 LAB — BRAIN NATRIURETIC PEPTIDE: B NATRIURETIC PEPTIDE 5: 65 pg/mL (ref 0.0–100.0)

## 2017-03-17 LAB — D-DIMER, QUANTITATIVE: D-Dimer, Quant: 0.69 ug/mL-FEU — ABNORMAL HIGH (ref 0.00–0.50)

## 2017-03-17 MED ORDER — IOPAMIDOL (ISOVUE-370) INJECTION 76%
100.0000 mL | Freq: Once | INTRAVENOUS | Status: AC | PRN
  Administered 2017-03-17: 100 mL via INTRAVENOUS

## 2017-03-17 MED ORDER — POTASSIUM CHLORIDE CRYS ER 20 MEQ PO TBCR
40.0000 meq | EXTENDED_RELEASE_TABLET | Freq: Once | ORAL | Status: AC
  Administered 2017-03-17: 40 meq via ORAL
  Filled 2017-03-17: qty 2

## 2017-03-17 MED ORDER — ALBUTEROL SULFATE (2.5 MG/3ML) 0.083% IN NEBU
5.0000 mg | INHALATION_SOLUTION | Freq: Once | RESPIRATORY_TRACT | Status: AC
  Administered 2017-03-17: 5 mg via RESPIRATORY_TRACT
  Filled 2017-03-17: qty 6

## 2017-03-17 NOTE — Telephone Encounter (Signed)
She is on the entocort taper. It shouldn't be causing this. I would recommend ED evaluation. Need to rule out cardiac issue. I am sorry she is having this!

## 2017-03-17 NOTE — ED Triage Notes (Signed)
Patient complains of shortness of breath that started 1 week ago. Felicia Pugh states it has been more intense today. She states shortness of breath is worse when walking.

## 2017-03-17 NOTE — Telephone Encounter (Signed)
Pt is calling because she is having some shortness of breath and a quiver in her chest.  She is tapering her  Prednisone and she is taking one a day. She did not know if that could be the reason. She called her PCP and they could not see her before her TCS. Please advise

## 2017-03-17 NOTE — ED Notes (Signed)
EKG given to Dr. Thurnell Garbe.

## 2017-03-17 NOTE — Discharge Instructions (Signed)
Your CT scan showed an incidental finding(s): "1) Stable 2 cm thyroid nodule is noted. Thyroid ultrasound is recommended for further evaluation., 2) Stable 5 mm nodule noted in right middle lobe. No follow-up needed if patient is low-risk. Non-contrast chest CT can be considered in 12 months if patient is high-risk. This recommendation follows the consensus statement: guidelines for Management of Incidental Pulmonary Nodules Detected on CT Images: From the Fleischner Society 2017; Radiology 2017; 284:228-243." Take your usual prescriptions as previously directed.  Call your regular medical doctor tomorrow to schedule a follow up appointment within the next 2 days.  Return to the Emergency Department immediately sooner if worsening.

## 2017-03-17 NOTE — ED Notes (Signed)
Pt O2 did not fall below 98% while ambulating

## 2017-03-17 NOTE — ED Provider Notes (Signed)
[facility] DEPT Provider Note   CSN: [id number] Arrival date & time: 03/17/17  1628     History   Chief Complaint Chief Complaint  Patient presents with  . Shortness of Breath    HPI Felicia Pugh is a 71 y.o. female.  HPI  Pt was seen at 1745. Per pt, c/o gradual onset and worsening of constant SOB for the past 2 weeks, worse over the past 1 week. Has been associated with a "quivering" or "shakey" feeling in her lower chest (pt cannot describe this further), but denies CP, fast HR, irregular HR and palpitations. Pt occasionally will hear herself "wheeze." Unclear what makes her SOB worsen or improve, or for how long it lasts for when it comes on. Pt states the SOB may worsen when she "talks too much when I walk." Denies CP/palpitations, no cough, no back pain, no abd pain, no N/V/D, no fevers.    Past Medical History:  Diagnosis Date  . 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 Cataract   . Complication of anesthesia    difficulty breathing after waking up from anesthesia  . Crohn's disease ([place])   . Diabetes mellitus without complication ([place])   . Diabetic neuropathy ([place])   . Hyperlipidemia   . Hypertension   . IBS (irritable bowel syndrome)   . Stroke [facility]) 2011  . Syncope and collapse   . Thyroid nodule   . Ulcer     Patient Active Problem List   Diagnosis Date Noted  . Vertigo 05/16/2015  . Type 2 diabetes mellitus without complication, without long-term current use of insulin ([place]) 05/16/2015  . Crohn's disease with complication ([place]) 74/03/1447  . Crohn's disease ([place]) 04/12/2015  . Diverticulosis of colon without hemorrhage   . History of stroke 07/08/2014  . Osteopenia 05/11/2014  . Hyperlipidemia with target LDL less than 100 06/01/2008  . Anemia, chronic disease 06/01/2008  . Essential hypertension 06/01/2008  . HEMORRHOIDS, INTERNAL 06/01/2008  . 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 WEIGHT GAIN  06/01/2008    Past Surgical History:  Procedure Laterality Date  . ABDOMINAL HYSTERECTOMY     partial  . BACK SURGERY     x3 (Dr Loyola Mast, Dr Patrice Paradise)  . BREAST SURGERY Right 2002   cyst removed  . COLONOSCOPY  10/102006   RMR: normal  . COLONOSCOPY  09/06/2009   RMR: Suboptimal prep on the right side. Ulcers noted at cecum and in the descending colon. Terminal ileum could not be intubated. Ounces with chronic active colitis, question Crohn's  . COLONOSCOPY N/A 03/27/2015   Dr. Gala Romney: abnormal cecum and IC valve most consistent with IBD. TI intubated and appeared normal. Likely Crohn's disease. Patient denies NSAID use. No improvement with Lialda samples at time of initial diagnosis  . ESOPHAGOGASTRODUODENOSCOPY    . LAPAROSCOPIC APPENDECTOMY N/A 07/21/2016   Procedure: APPENDECTOMY LAPAROSCOPIC;  Surgeon: Aviva Signs, MD;  Location: [facility];  Service: General;  Laterality: N/A;  . Small bowel capsule endoscopy  06/10/2005   RMR: single erosion versus AVM, distal ileum  . Small bowel capsule endoscopy  prior to 05/2005   [place]: reported erosions/ulcerations per medical record. actual report unavailable.    OB History    No data available       Home Medications    Prior to Admission medications   Medication Sig Start Date End Date Taking? Authorizing Provider  atorvastatin (LIPITOR) 40 MG tablet Take 1 tablet (  40 mg total) by mouth daily. Patient taking differently: Take 40 mg by mouth every evening.  09/17/16   Wardell Honour, MD  balsalazide (COLAZAL) 750 MG capsule Take 3 capsules (2,250 mg total) by mouth 3 (three) times daily. 10/22/16   Annitta Needs, NP  budesonide (ENTOCORT EC) 3 MG 24 hr capsule Take 3 mg by mouth daily.  01/07/17   [provider]  calcium-vitamin D (OSCAL WITH D) 500-200 MG-UNIT tablet Take 1 tablet by mouth daily.     [provider]  dicyclomine (BENTYL) 10 MG capsule TAKE ONE CAPSULE BY MOUTH 4 TIMES DAILY WITH MEALS AND AT BEDTIME AS  NEEDED FOR  CRAMPS  AND  DIARRHEA Patient taking differently: Take 10 mg by mouth 4 (four) times daily.  09/17/16   Wardell Honour, MD  Ferrous Sulfate (IRON) 325 (65 Fe) MG TABS Take 1 tablet by mouth daily.    [provider]  Insulin NPH Human, Isophane, (NOVOLIN N [place]) Inject 10-20 Units into the skin 2 (two) times daily. 20 UNITS IN THE MORNING & 10 UNITS AT NIGHT    [provider]  Insulin Syringes, Disposable, U-100 0.5 ML MISC Give insulin twice daily as directed 09/19/14   Hassell Done, Mary-Margaret, FNP  lisinopril-hydrochlorothiazide (PRINZIDE,ZESTORETIC) 20-12.5 MG tablet Take 1 tablet by mouth daily. 09/17/16   Wardell Honour, MD  Multiple Vitamin (MULTIVITAMIN) tablet Take 1 tablet by mouth daily.    [provider]  Na Sulfate-K Sulfate-Mg Sulf (SUPREP BOWEL PREP KIT) 17.5-3.13-1.6 GM/180ML SOLN Take 1 kit by mouth as directed. 02/19/17   Rourk, Cristopher Estimable, MD  [facility] VERIO test strip USE 1 STRIP TO CHECK GLUCOSE TWICE DAILY 01/22/17   Dettinger, Fransisca Kaufmann, MD  sitaGLIPtin (JANUVIA) 100 MG tablet Take 1 tablet (100 mg total) by mouth daily. 02/11/17   Cherre Robins, PharmD    Family History Family History  Problem Relation Age of Onset  . Hypertension Mother   . Stroke Mother   . Diabetes Father        w/ BKA  . Hypertension Father   . Cancer Sister   . Diabetes Sister        stomach  . Diabetes Brother   . Diabetes Sister   . Cancer Sister        breast  . Hypertension Sister   . Colon cancer Neg Hx   . Inflammatory bowel disease Neg Hx     Social History Social History  Substance Use Topics  . Smoking status: Never Smoker  . Smokeless tobacco: Never Used  . Alcohol use No     Allergies   Metformin and related; Asa [aspirin]; Nsaids; and Other   Review of Systems Review of Systems ROS: Statement: All systems negative except as marked or noted in the HPI; Constitutional: Negative for fever and chills. ; ; Eyes: Negative for eye pain,  redness and discharge. ; ; ENMT: Negative for ear pain, hoarseness, nasal congestion, sinus pressure and sore throat. ; ; Cardiovascular: +SOB. Negative for chest pain, palpitations, diaphoresis, and peripheral edema. ; ; Respiratory: Negative for cough, wheezing and stridor. ; ; Gastrointestinal: Negative for nausea, vomiting, diarrhea, abdominal pain, blood in stool, hematemesis, jaundice and rectal bleeding. . ; ; Genitourinary: Negative for dysuria, flank pain and hematuria. ; ; Musculoskeletal: Negative for back pain and neck pain. Negative for swelling and trauma.; ; Skin: Negative for pruritus, rash, abrasions, blisters, bruising and skin lesion.; ; Neuro: Negative for headache, lightheadedness  and neck stiffness. Negative for weakness, altered level of consciousness, altered mental status, extremity weakness, paresthesias, involuntary movement, seizure and syncope.       Physical Exam Updated Vital Signs BP (!) 152/85 (BP Location: Right Arm)   Pulse 91   Temp 98.1 F (36.7 C) (Oral)   Resp 18   Ht 5' 2"  (1.575 m)   Wt 84.4 kg (186 lb)   SpO2 100%   BMI 34.02 kg/m    Patient Vitals for the past 24 hrs:  BP Temp Temp src Pulse Resp SpO2 Height Weight  03/17/17 2125 120/67 - - 85 16 94 % - -  03/17/17 2037 135/76 - - 87 17 99 % - -  03/17/17 2030 135/76 - - 88 - 96 % - -  03/17/17 2000 (!) 143/69 - - 91 20 98 % - -  03/17/17 1930 (!) 150/78 - - - 16 - - -  03/17/17 1800 136/73 - - - 15 - - -  03/17/17 1753 - - - - - 100 % - -  03/17/17 1745 132/70 - - 85 11 100 % - -  03/17/17 1635 - - - - - - 5' 2"  (1.575 m) 84.4 kg (186 lb)  03/17/17 1634 (!) 152/85 98.1 F (36.7 C) Oral 91 18 100 % - -     Physical Exam 1750: Physical examination:  Nursing notes reviewed; Vital signs and O2 SAT reviewed;  Constitutional: Well developed, Well nourished, Well hydrated, In no acute distress; Head:  Normocephalic, atraumatic; Eyes: EOMI, PERRL, No scleral icterus; ENMT: Mouth and pharynx  normal, Mucous membranes moist; Neck: Supple, Full range of motion, No lymphadenopathy; Cardiovascular: Regular rate and rhythm, No gallop; Respiratory: Breath sounds coarse & equal bilaterally, No wheezes.  Speaking full sentences with ease, Normal respiratory effort/excursion; Chest: Nontender, Movement normal; Abdomen: Soft, Nontender, Nondistended, Normal bowel sounds; Genitourinary: No CVA tenderness; Extremities: Pulses normal, No tenderness, No edema, No calf edema or asymmetry.; Neuro: AA&Ox3, vague historian. Major CN grossly intact.  Speech clear. No gross focal motor or sensory deficits in extremities.; Skin: Color normal, Warm, Dry.   ED Treatments / Results  Labs (all labs ordered are listed, but only abnormal results are displayed)   EKG  EKG Interpretation  Date/Time:  Monday March 17 2017 16:40:18 EDT Ventricular Rate:  86 PR Interval:  150 QRS Duration: 92 QT Interval:  400 QTC Calculation: 478 R Axis:   33 Text Interpretation:  Normal sinus rhythm Nonspecific ST and T wave abnormality Prolonged QT When compared with ECG of 07/22/2016 QT has lengthened Otherwise no significant change Confirmed by Francine Graven [phone]) on 03/17/2017 5:59:06 PM       Radiology   Procedures Procedures (including critical care time)  Medications Ordered in ED Medications  albuterol (PROVENTIL) (2.5 MG/3ML) 0.083% nebulizer solution 5 mg (5 mg Nebulization Given 03/17/17 1752)     Initial Impression / Assessment and Plan / ED Course  I have reviewed the triage vital signs and the nursing notes.  Pertinent labs & imaging results that were available during my care of the patient were reviewed by me and considered in my medical decision making (see chart for details).  MDM Reviewed: previous chart, nursing note and vitals Reviewed previous: labs and ECG Interpretation: labs, ECG, x-ray and CT scan   Results for orders placed or performed during the hospital encounter of  19/14/78  Basic metabolic panel  Result Value Ref Range   Sodium 140 135 - 145 mmol/L  Potassium 2.9 (L) 3.5 - 5.1 mmol/L   Chloride 102 101 - 111 mmol/L   CO2 30 22 - 32 mmol/L   Glucose, Bld 185 (H) 65 - 99 mg/dL   BUN 17 6 - 20 mg/dL   Creatinine, Ser 0.82 0.44 - 1.00 mg/dL   Calcium 9.1 8.9 - 10.3 mg/dL   GFR calc non Af Amer >60 >60 mL/min   GFR calc Af Amer >60 >60 mL/min   Anion gap 8 5 - 15  Brain natriuretic peptide  Result Value Ref Range   B Natriuretic Peptide 65.0 0.0 - 100.0 pg/mL  Troponin I  Result Value Ref Range   Troponin I <0.03 <0.03 ng/mL  CBC with Differential  Result Value Ref Range   WBC 10.5 4.0 - 10.5 K/uL   RBC 3.99 3.87 - 5.11 MIL/uL   Hemoglobin 11.4 (L) 12.0 - 15.0 g/dL   HCT 35.9 (L) 36.0 - 46.0 %   MCV 90.0 78.0 - 100.0 fL   MCH 28.6 26.0 - 34.0 pg   MCHC 31.8 30.0 - 36.0 g/dL   RDW 13.4 11.5 - 15.5 %   Platelets 253 150 - 400 K/uL   Neutrophils Relative % 71 %   Neutro Abs 7.4 1.7 - 7.7 K/uL   Lymphocytes Relative 23 %   Lymphs Abs 2.4 0.7 - 4.0 K/uL   Monocytes Relative 5 %   Monocytes Absolute 0.5 0.1 - 1.0 K/uL   Eosinophils Relative 1 %   Eosinophils Absolute 0.1 0.0 - 0.7 K/uL   Basophils Relative 0 %   Basophils Absolute 0.0 0.0 - 0.1 K/uL  D-dimer, quantitative  Result Value Ref Range   D-Dimer, Quant 0.69 (H) 0.00 - 0.50 ug/mL-FEU  Troponin I  Result Value Ref Range   Troponin I <0.03 <0.03 ng/mL   Dg Chest 2 View Result Date: 03/17/2017 CLINICAL DATA:  Shortness of breath for 1 week more intense today, worse when walking, history diabetes mellitus, hypertension, Crohn's disease EXAM: CHEST  2 VIEW COMPARISON:  07/22/2016 FINDINGS: Upper normal heart size. Mediastinal contours and pulmonary vascularity normal. Chronic bronchitic changes. Chronic elevation of LEFT diaphragm with mild LEFT basilar atelectasis. No acute infiltrate, pleural effusion or pneumothorax. Gaseous distention of stomach. Bones demineralized. IMPRESSION:  Bronchitic changes with mild chronic LEFT basilar atelectasis. No acute abnormalities. Electronically Signed   By: Lavonia Dana M.D.   On: 03/17/2017 17:07   Ct Angio Chest Pe W/cm &/or Wo Cm Result Date: 03/17/2017 CLINICAL DATA:  Shortness of breath. EXAM: CT ANGIOGRAPHY CHEST WITH CONTRAST TECHNIQUE: Multidetector CT imaging of the chest was performed using the standard protocol during bolus administration of intravenous contrast. Multiplanar CT image reconstructions and MIPs were obtained to evaluate the vascular anatomy. CONTRAST:  100 mL of Isovue 370 intravenously. COMPARISON:  CT scan of July 23, 2016. FINDINGS: Cardiovascular: Satisfactory opacification of the pulmonary arteries to the segmental level. No evidence of pulmonary embolism. Normal heart size. No pericardial effusion. Aortic atherosclerosis is noted. Mediastinum/Nodes: Stable 2 cm thyroid nodule is noted arising inferiorly from the midline. No mediastinal mass or adenopathy is noted peer the esophagus is unremarkable. Lungs/Pleura: No pneumothorax or pleural effusion is noted. Stable 5 mm nodule is noted in right middle lobe compared to prior exam, best seen on image number 48 of series 6. Left lung is clear. Upper Abdomen: No acute abnormality. Musculoskeletal: No chest wall abnormality. No acute or significant osseous findings. Review of the MIP images confirms the above findings. IMPRESSION: No  definite evidence of pulmonary embolus. Stable 2 cm thyroid nodule is noted. Thyroid ultrasound is recommended for further evaluation. Stable 5 mm nodule noted in right middle lobe. No follow-up needed if patient is low-risk. Non-contrast chest CT can be considered in 12 months if patient is high-risk. This recommendation follows the consensus statement: Guidelines for Management of Incidental Pulmonary Nodules Detected on CT Images: From the Fleischner Society 2017; Radiology 2017; 284:228-243. Aortic Atherosclerosis (ICD10-I70.0). Electronically  Signed   By: Marijo Conception, M.D.   On: 03/17/2017 20:18    2130:  Pt ambulated with steady gait, easy resps, NAD. Denied CP/SOB and Sats remained 98% R/A while ambulating. Potassium repleted PO. Pt has tol PO well without N/V. Pt received neb treatment and stated she "felt better." Pt states she wants to go home now. Dx and testing d/w pt and family.  Questions answered.  Verb understanding, agreeable to d/c home with outpt f/u.    Final Clinical Impressions(s) / ED Diagnoses   Final diagnoses:  None    New Prescriptions New Prescriptions   No medications on file     Francine Graven, DO 03/21/17 4034

## 2017-03-17 NOTE — Telephone Encounter (Signed)
Pt is aware to go to ED for cardiac evaluation.

## 2017-03-18 ENCOUNTER — Telehealth: Payer: Self-pay | Admitting: Internal Medicine

## 2017-03-18 NOTE — Telephone Encounter (Signed)
Stable 2 cm thyroid nodule noted. Review of ED evaluation with bronchitic changes on xray. CTA chest without PE. Received neb treatment and felt improved.   May be best to see PCP first and postpone colonoscopy to ensure back to respiratory baseline. When is next available opening for colonoscopy?

## 2017-03-18 NOTE — Telephone Encounter (Signed)
Pt is aware and she has been moved to 04/10/17 @ 9:30 am. She going to see her PCP Thursday

## 2017-03-18 NOTE — Telephone Encounter (Signed)
Please advise if she can still have TCS tomorrow

## 2017-03-18 NOTE — Telephone Encounter (Signed)
Pt is scheduled for colonoscopy tomorrow with RMR. She has questions if she should still have procedure or not. She said she went to the ER for shortness of breath and said something was found on her thyroid. She asked for the nurse to call her back at [phone]

## 2017-03-20 ENCOUNTER — Encounter: Payer: Self-pay | Admitting: Family Medicine

## 2017-03-20 ENCOUNTER — Ambulatory Visit (INDEPENDENT_AMBULATORY_CARE_PROVIDER_SITE_OTHER): Payer: Medicare HMO | Admitting: Family Medicine

## 2017-03-20 VITALS — BP 125/63 | HR 85 | Temp 97.1°F | Ht 62.0 in | Wt 179.0 lb

## 2017-03-20 DIAGNOSIS — E785 Hyperlipidemia, unspecified: Secondary | ICD-10-CM

## 2017-03-20 DIAGNOSIS — E041 Nontoxic single thyroid nodule: Secondary | ICD-10-CM

## 2017-03-20 DIAGNOSIS — E119 Type 2 diabetes mellitus without complications: Secondary | ICD-10-CM | POA: Diagnosis not present

## 2017-03-20 DIAGNOSIS — R0602 Shortness of breath: Secondary | ICD-10-CM

## 2017-03-20 LAB — BAYER DCA HB A1C WAIVED: HB A1C: 8 % — AB (ref <7.0)

## 2017-03-20 NOTE — Progress Notes (Signed)
BP 125/63   Pulse 85   Temp (!) 97.1 F (36.2 C) (Oral)   Ht 5' 2"  (1.575 m)   Wt 179 lb (81.2 kg)   SpO2 98%   BMI 32.74 kg/m    Subjective:    Patient ID: Felicia Pugh, female    DOB: 1946/02/13, 71 y.o.   MRN: [id number]  HPI: Felicia Pugh is a 71 y.o. female presenting on 03/20/2017 for Diabetes (pt here today for routine f/u of her chronic medical conditions and also was seen at ED for SOB and she had chest x-ray and also CT which showed a thyroid nodule that she needs f/u on.)   HPI Type 2 diabetes mellitus Patient comes in today for recheck of his diabetes. Patient has been currently taking Januvia and Novolin NPH. Patient is currently on an ACE inhibitor/ARB. Patient has not seen an ophthalmologist this year. Patient denies any issues with their feet.   Hyperlipidemia Patient is coming in for recheck of his hyperlipidemia. The patient is currently taking atorvastatin. They deny any issues with myalgias or history of liver damage from it. They deny any focal numbness or weakness or chest pain.   Shortness of breath Patient comes in today complaining of shortness of breath that has gradually been worsening on her. She she feels like she is having wheezing spells and recently had imaging from the emergency department and they did not do so fine anything. She denies any history of smoking or history of asthma. She does not really know why she feels more short of breath. She feels more short of breath while laying down and sleeping but feels better when she is up and around until she overexerts herself. She denies any coughing or wheezing spells. She denies any fevers or chills.  Thyroid nodule Patient was recently told on a scan that she had a thyroid nodule that she needed to follow-up with this. This was a CT chest.  They deny any issues with hair changes or heat or cold problems or diarrhea or constipation. They deny any chest pain or palpitations.   Relevant past medical,  surgical, family and social history reviewed and updated as indicated. Interim medical history since our last visit reviewed. Allergies and medications reviewed and updated.  Review of Systems  Constitutional: Negative for chills and fever.  HENT: Negative for congestion, ear discharge and ear pain.   Eyes: Negative for redness and visual disturbance.  Respiratory: Positive for shortness of breath. Negative for cough, chest tightness and wheezing.   Cardiovascular: Negative for chest pain and leg swelling.  Endocrine: Negative for cold intolerance and heat intolerance.  Genitourinary: Negative for difficulty urinating and dysuria.  Musculoskeletal: Negative for back pain and gait problem.  Skin: Negative for rash.  Neurological: Negative for light-headedness and headaches.  Psychiatric/Behavioral: Negative for agitation and behavioral problems.  All other systems reviewed and are negative.   Per HPI unless specifically indicated above     Objective:    BP 125/63   Pulse 85   Temp (!) 97.1 F (36.2 C) (Oral)   Ht 5' 2"  (1.575 m)   Wt 179 lb (81.2 kg)   SpO2 98%   BMI 32.74 kg/m   Wt Readings from Last 3 Encounters:  03/20/17 179 lb (81.2 kg)  03/17/17 186 lb (84.4 kg)  02/19/17 181 lb 12.8 oz (82.5 kg)    Physical Exam  Constitutional: She is oriented to person, place, and time. She appears well-developed and well-nourished.  No distress.  Eyes: Conjunctivae are normal.  Neck: Neck supple. No thyromegaly present.  Cardiovascular: Normal rate, regular rhythm, normal heart sounds and intact distal pulses.   No murmur heard. Pulmonary/Chest: Effort normal and breath sounds normal. No respiratory distress. She has no wheezes. She has no rales.  Musculoskeletal: Normal range of motion. She exhibits no edema.  Lymphadenopathy:    She has no cervical adenopathy.  Neurological: She is alert and oriented to person, place, and time. Coordination normal.  Skin: Skin is warm and  dry. No rash noted. She is not diaphoretic.  Psychiatric: She has a normal mood and affect. Her behavior is normal.  Nursing note and vitals reviewed.     Assessment & Plan:   Problem List Items Addressed This Visit      Endocrine   Type 2 diabetes mellitus without complication, without long-term current use of insulin ([place]) - Primary   Relevant Orders   Bayer DCA Hb A1c Waived (Completed)     Other   Hyperlipidemia with target LDL less than 100    Other Visit Diagnoses    Thyroid nodule       Relevant Orders   US THYROID   Thyroid Panel With TSH (Completed)   Shortness of breath       Relevant Orders   Ambulatory referral to Pulmonology       Follow up plan: Return in about 3 months (around 06/20/2017), or if symptoms worsen or fail to improve, for Follow-up diabetes and cholesterol.  Counseling provided for all of the vaccine components Orders Placed This Encounter  Procedures  . US THYROID  . TSH  . Bayer [facility] Hb A1c [place], MD [facility] 03/20/2017, 9:17 AM

## 2017-03-21 LAB — THYROID PANEL WITH TSH
Free Thyroxine Index: 2.4 (ref 1.2–4.9)
T3 UPTAKE RATIO: 29 % (ref 24–39)
T4 TOTAL: 8.2 ug/dL (ref 4.5–12.0)
TSH: 0.902 u[IU]/mL (ref 0.450–4.500)

## 2017-03-24 ENCOUNTER — Telehealth: Payer: Self-pay | Admitting: Family Medicine

## 2017-03-24 NOTE — Telephone Encounter (Signed)
Pt aware.

## 2017-03-24 NOTE — Telephone Encounter (Signed)
Januvia is not one that usually causes diarrhea, taking it with breakfast usually works fine with it. Have her keep taking it but if she keeps having this issue over the next 3 or 4 days that we may have to switch her to something else. Have her call back around Thursday or Friday if she is still having this issue

## 2017-03-24 NOTE — Telephone Encounter (Signed)
Patient reports since beginning Januvia she is having diarrhea several times per day.  She takes medication in the mornings with breakfast.  Do you recommend anything else she should do or can she change medication?

## 2017-03-25 ENCOUNTER — Ambulatory Visit (HOSPITAL_COMMUNITY)
Admission: RE | Admit: 2017-03-25 | Discharge: 2017-03-25 | Disposition: A | Discharge status: Home / Self Care | Payer: Medicare HMO | Source: Ambulatory Visit | Attending: Family Medicine | Admitting: Family Medicine

## 2017-03-25 DIAGNOSIS — E042 Nontoxic multinodular goiter: Secondary | ICD-10-CM | POA: Insufficient documentation

## 2017-03-25 DIAGNOSIS — E041 Nontoxic single thyroid nodule: Secondary | ICD-10-CM | POA: Insufficient documentation

## 2017-03-27 ENCOUNTER — Other Ambulatory Visit: Payer: Self-pay | Admitting: *Deleted

## 2017-03-27 DIAGNOSIS — E010 Iodine-deficiency related diffuse (endemic) goiter: Secondary | ICD-10-CM

## 2017-03-27 NOTE — Progress Notes (Signed)
ambulat

## 2017-03-31 DIAGNOSIS — R490 Dysphonia: Secondary | ICD-10-CM | POA: Diagnosis not present

## 2017-03-31 DIAGNOSIS — E042 Nontoxic multinodular goiter: Secondary | ICD-10-CM | POA: Diagnosis not present

## 2017-03-31 DIAGNOSIS — E041 Nontoxic single thyroid nodule: Secondary | ICD-10-CM | POA: Diagnosis not present

## 2017-04-03 ENCOUNTER — Other Ambulatory Visit: Payer: Self-pay | Admitting: Surgery

## 2017-04-03 ENCOUNTER — Encounter: Payer: Self-pay | Admitting: *Deleted

## 2017-04-03 DIAGNOSIS — E041 Nontoxic single thyroid nodule: Secondary | ICD-10-CM

## 2017-04-04 ENCOUNTER — Telehealth: Payer: Self-pay

## 2017-04-04 NOTE — Telephone Encounter (Signed)
Pt is calling to see if she should put her TCS on hold until after she finishes her other test. She saw her thyroid doctor Monday and they are going to do some bx of the places on her thyroid. Then she is going to she her doctor about her lungs in Sept. She is just wanting to know what you think she should do. Her TCS is set up for 04/10/17. Please advise

## 2017-04-04 NOTE — Telephone Encounter (Signed)
How is she doing? Hope diarrhea is better. She can postpone this for a bit, but I would really like her to have it done when things settled down for her. Could she tentatively do a date in later September, and I see her before then to update things?

## 2017-04-04 NOTE — Telephone Encounter (Signed)
She is doing some better. She is having some cramping and the diarrhea is hit and miss on days. She is going to the lung doctor on 05/09/17. I have moved her TCS to 05/15/17. We will bring her back into the office before that date for you to update her information.

## 2017-04-08 ENCOUNTER — Other Ambulatory Visit: Payer: Self-pay | Admitting: Surgery

## 2017-04-08 DIAGNOSIS — E041 Nontoxic single thyroid nodule: Secondary | ICD-10-CM

## 2017-04-10 ENCOUNTER — Ambulatory Visit
Admission: RE | Admit: 2017-04-10 | Discharge: 2017-04-10 | Disposition: A | Discharge status: Home / Self Care | Payer: Medicare HMO | Source: Ambulatory Visit | Attending: Surgery | Admitting: Surgery

## 2017-04-10 ENCOUNTER — Other Ambulatory Visit (HOSPITAL_COMMUNITY)
Admission: RE | Admit: 2017-04-10 | Discharge: 2017-04-10 | Disposition: A | Discharge status: Home / Self Care | Payer: Medicare HMO | Source: Ambulatory Visit | Attending: Radiology | Admitting: Radiology

## 2017-04-10 DIAGNOSIS — E041 Nontoxic single thyroid nodule: Secondary | ICD-10-CM | POA: Insufficient documentation

## 2017-04-25 ENCOUNTER — Ambulatory Visit: Payer: Medicare HMO | Admitting: Gastroenterology

## 2017-05-01 ENCOUNTER — Encounter: Payer: Self-pay | Admitting: *Deleted

## 2017-05-01 ENCOUNTER — Ambulatory Visit (INDEPENDENT_AMBULATORY_CARE_PROVIDER_SITE_OTHER): Payer: Medicare HMO | Admitting: *Deleted

## 2017-05-01 VITALS — BP 128/76 | HR 87 | Ht 61.75 in | Wt 187.0 lb

## 2017-05-01 DIAGNOSIS — Z0000 Encounter for general adult medical examination without abnormal findings: Secondary | ICD-10-CM | POA: Diagnosis not present

## 2017-05-01 NOTE — Patient Instructions (Addendum)
  Felicia Pugh ,  Thank you for taking time to come for your Medicare Wellness Visit. I appreciate your ongoing commitment to your health goals. Please review the following plan we discussed and let me know if I can assist you in the future.   Goals    . Exercise 3x per week (30 min per time)       This is a list of the screening recommended for you and due dates:  Health Maintenance  Topic Date Due  . Eye exam for diabetics  06/18/2016  . Flu Shot  03/12/2017  . Tetanus Vaccine  06/18/2017*  . Hemoglobin A1C  09/20/2017  . Complete foot exam   12/16/2017  . Mammogram  02/06/2019  . Colon Cancer Screening  03/26/2025  . DEXA scan (bone density measurement)  Completed  .  Hepatitis C: One time screening is recommended by Center for Disease Control  (CDC) for  adults born from [birth year] through 1965.   Completed  . Pneumonia vaccines  Completed  *Topic was postponed. The date shown is not the original due date.

## 2017-05-01 NOTE — Progress Notes (Signed)
Subjective:   Felicia Pugh is a 71 y.o. female who presents for an Subsequent Medicare Annual Wellness Visit. Ms Parco lives at home with her husband and has 5 children, 9 grandchildren, and 7 great grandchildren.She is active throughout the day. She has been referred to Dr Melvyn Novas for an abnormal chest Ct and to an ENT for hoarseness. She has not seen either of them yet.   Review of Systems    Reports that her health is about the same as last year.   Cardiac Risk Factors include: advanced age (>16mn, >>66women);hypertension;diabetes mellitus;dyslipidemia;obesity (BMI >30kg/m2)  HEENT: Hoarseness-referred to ENT  Other systems negative today.     Objective:    Today's Vitals   05/01/17 1249  BP: 128/76  Pulse: 87  Weight: 187 lb (84.8 kg)  Height: 5' 1.75" (1.568 m)   Body mass index is 34.48 kg/m.   Current Medications (verified) Outpatient Encounter Prescriptions as of 05/01/2017  Medication Sig  . atorvastatin (LIPITOR) 40 MG tablet Take 1 tablet (40 mg total) by mouth daily. (Patient taking differently: Take 40 mg by mouth every evening. )  . balsalazide (COLAZAL) 750 MG capsule Take 3 capsules (2,250 mg total) by mouth 3 (three) times daily.  .Marland Kitchendicyclomine (BENTYL) 10 MG capsule TAKE ONE CAPSULE BY MOUTH 4 TIMES DAILY WITH MEALS AND AT BEDTIME AS NEEDED FOR  CRAMPS  AND  DIARRHEA (Patient taking differently: Take 10 mg by mouth 4 (four) times daily. )  . Ferrous Sulfate (IRON) 325 (65 Fe) MG TABS Take 1 tablet by mouth daily.  . Insulin NPH Human, Isophane, (NOVOLIN N [place]) Inject 10-20 Units into the skin 2 (two) times daily. 20 UNITS IN THE MORNING & 10 UNITS AT NIGHT  . Insulin Syringes, Disposable, U-100 0.5 ML MISC Give insulin twice daily as directed  . lisinopril-hydrochlorothiazide (PRINZIDE,ZESTORETIC) 20-12.5 MG tablet Take 1 tablet by mouth daily.  . Multiple Vitamin (MULTIVITAMIN) tablet Take 1 tablet by mouth daily.  . Na Sulfate-K Sulfate-Mg Sulf (SUPREP BOWEL  PREP KIT) 17.5-3.13-1.6 GM/180ML SOLN Take 1 kit by mouth as directed. (Patient not taking: Reported on 03/20/2017)  . ONETOUCH VERIO test strip USE 1 STRIP TO CHECK GLUCOSE TWICE DAILY  . sitaGLIPtin (JANUVIA) 100 MG tablet Take 1 tablet (100 mg total) by mouth daily.  . [DISCONTINUED] budesonide (ENTOCORT EC) 3 MG 24 hr capsule Take 3 mg by mouth daily.    No facility-administered encounter medications on file as of 05/01/2017.     Allergies (verified) Metformin and related; Asa [aspirin]; Nsaids; and Other   History: Past Medical History:  Diagnosis Date  . 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 KSonny Dandy no longer seeing anyone (02/2015)  . Cataract   . Complication of anesthesia    difficulty breathing after waking up from anesthesia  . Crohn's disease ([place]   . Diabetes mellitus without complication ([place]   . Diabetic neuropathy ([place]   . Hyperlipidemia   . Hypertension   . IBS (irritable bowel syndrome)   . Stroke ([facility] 2011  . Syncope and collapse   . Thyroid nodule   . Ulcer    Past Surgical History:  Procedure Laterality Date  . ABDOMINAL HYSTERECTOMY     partial  . BACK SURGERY     x3 (Dr KLoyola Mast Dr CPatrice Paradise  . BREAST SURGERY Right 2002   cyst removed  . COLONOSCOPY  10/102006   RMR: normal  . COLONOSCOPY  09/06/2009   RMR:  Suboptimal prep on the right side. Ulcers noted at cecum and in the descending colon. Terminal ileum could not be intubated. Ounces with chronic active colitis, question Crohn's  . COLONOSCOPY N/A 03/27/2015   Dr. Gala Romney: abnormal cecum and IC valve most consistent with IBD. TI intubated and appeared normal. Likely Crohn's disease. Patient denies NSAID use. No improvement with Lialda samples at time of initial diagnosis  . ESOPHAGOGASTRODUODENOSCOPY    . LAPAROSCOPIC APPENDECTOMY N/A 07/21/2016   Procedure: APPENDECTOMY LAPAROSCOPIC;  Surgeon: Aviva Signs, MD;  Location: [facility];  Service: General;  Laterality: N/A;  . Small bowel  capsule endoscopy  06/10/2005   RMR: single erosion versus AVM, distal ileum  . Small bowel capsule endoscopy  prior to 05/2005   [place]: reported erosions/ulcerations per medical record. actual report unavailable.   Family History  Problem Relation Age of Onset  . Hypertension Mother   . Stroke Mother   . Diabetes Father        w/ BKA  . Hypertension Father   . Cancer Sister   . Diabetes Sister        stomach  . Diabetes Brother   . Diabetes Sister   . Cancer Sister        breast  . Hypertension Sister   . Hypertension Daughter   . Hypertension Son   . Multiple sclerosis Son   . Colon cancer Neg Hx   . Inflammatory bowel disease Neg Hx    Social History   Occupational History  . Not on file.   Social History Main Topics  . Smoking status: Never Smoker  . Smokeless tobacco: Never Used  . Alcohol use No  . Drug use: No  . Sexual activity: No    Tobacco Counseling No tobacco use  Activities of Daily Living In your present state of health, do you have any difficulty performing the following activities: 05/01/2017 07/21/2016  Hearing? 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 N -  Difficulty concentrating or making decisions? Y -  Comment Has noticed some short term memory loss -  Walking or climbing stairs? N -  Dressing or bathing? N -  Doing errands, shopping? N N  Preparing Food and eating ? N -  Using the Toilet? N -  In the past six months, have you accidently leaked urine? N -  Do you have problems with loss of bowel control? N -  Managing your Medications? N -  Comment Keeps them in regular bottles -  Managing your Finances? N -  Housekeeping or managing your Housekeeping? N -  Some recent data might be hidden    Immunizations and Health Maintenance Immunization History  Administered Date(s) Administered  . Influenza,inj,Quad PF,6+ Mos 05/13/2013, 05/27/2014, 06/22/2015, 06/11/2016  . Pneumococcal Conjugate-13 07/08/2014  .  Pneumococcal Polysaccharide-23 12/16/2016   Health Maintenance Due  Topic Date Due  . OPHTHALMOLOGY EXAM  06/18/2016  . INFLUENZA VACCINE  03/12/2017    Patient Care Team: Dettinger, Fransisca Kaufmann, MD as PCP - General (Family Medicine) Gala Romney Cristopher Estimable, MD as Consulting Physician (Gastroenterology) Starling Manns, MD as Consulting Physician (Orthopedic Surgery) Annitta Needs, NP (Gastroenterology)   Seen in the ED for SOB in Aug 2018. No hospital admissions this past year.     Assessment:   This is a routine wellness examination for Makhia.   Hearing/Vision screen No hearing or vision deficits noted. Last eye exam 09/2016.  Dietary issues and exercise activities discussed:  Current Exercise Habits: The patient does not participate in regular exercise at present;Home exercise routine (Stays active and walks during regular activity), Exercise limited by: None identified  Goals    . Exercise 3x per week (30 min per time)      Depression Screen PHQ 2/9 Scores 05/01/2017 03/20/2017 02/13/2017 12/16/2016 09/17/2016 06/11/2016 03/06/2016  PHQ - 2 Score 0 0 0 0 0 0 0    Fall Risk Fall Risk  05/01/2017 03/20/2017 02/13/2017 12/16/2016 09/17/2016  Falls in the past year? No No No No No  Number falls in past yr: - - - - -  Injury with Fall? - - - - -    Cognitive Function: MMSE - Mini Mental State Exam 05/01/2017  Orientation to time 5  Orientation to Place 5  Registration 3  Attention/ Calculation 4  Recall 2  Language- name 2 objects 2  Language- repeat 1  Language- follow 3 step command 3  Language- read & follow direction 1  Write a sentence 1  Copy design 1  Total score 28  normal exam      Screening Tests Health Maintenance  Topic Date Due  . OPHTHALMOLOGY EXAM  06/18/2016  . INFLUENZA VACCINE  03/12/2017  . TETANUS/TDAP  06/18/2017 (Originally 01/22/1965)  . HEMOGLOBIN A1C  09/20/2017  . FOOT EXAM  12/16/2017  . MAMMOGRAM  02/06/2019  . COLONOSCOPY  03/26/2025  . DEXA SCAN  Completed   . Hepatitis C Screening  Completed  . PNA vac Low Risk Adult  Completed      Plan:  Keep f/u appointments with pulmonary and ENT.  May consider nutrition referal since she is balancing Crohn's and Diabetes. Will wait until after consult for abn CT scan.  Weight watchers Exercise/walk daily Advanced directives given and discussed. Bring a signed/notarized copy to our office.  F/u with Dr Warrick Parisian 06/26/17 Flu shot at next appointment  I have personally reviewed and noted the following in the patient's chart:   . Medical and social history . Use of alcohol, tobacco or illicit drugs  . Current medications and supplements . Functional ability and status . Nutritional status . Physical activity . Advanced directives . List of other physicians . Hospitalizations, surgeries, and ER visits in previous 12 months . Vitals . Screenings to include cognitive, depression, and falls . Referrals and appointments  In addition, I have reviewed and discussed with patient certain preventive protocols, quality metrics, and best practice recommendations. A written personalized care plan for preventive services as well as general preventive health recommendations were provided to patient.    Chong Sicilian, RN  05/01/2017

## 2017-05-02 ENCOUNTER — Encounter: Payer: Self-pay | Admitting: Gastroenterology

## 2017-05-02 ENCOUNTER — Ambulatory Visit (INDEPENDENT_AMBULATORY_CARE_PROVIDER_SITE_OTHER): Payer: Medicare HMO | Admitting: Gastroenterology

## 2017-05-02 VITALS — BP 131/80 | HR 95 | Temp 97.6°F | Ht 62.5 in | Wt 179.6 lb

## 2017-05-02 DIAGNOSIS — K219 Gastro-esophageal reflux disease without esophagitis: Secondary | ICD-10-CM | POA: Diagnosis not present

## 2017-05-02 DIAGNOSIS — K5090 Crohn's disease, unspecified, without complications: Secondary | ICD-10-CM | POA: Diagnosis not present

## 2017-05-02 MED ORDER — PANTOPRAZOLE SODIUM 40 MG PO TBEC: 40.0000 mg | DELAYED_RELEASE_TABLET | Freq: Every day | ORAL | 3 refills | Status: DC

## 2017-05-02 NOTE — Patient Instructions (Signed)
We will continue with the colonoscopy. Take just 1/2 insulin dose the evening before and no insulin the day of the procedure. No Januvia the day of the procedure.  I have sent in Protonix to take once each morning, 30 minutes before breakfast. This is for the burping and likely reflux you are having.  You may wean off/down from the Bentyl.   I will see you after the procedure!

## 2017-05-02 NOTE — Assessment & Plan Note (Signed)
Belching and sore throat noted. She reports an ENT visit upcoming. No PPI currently. No dysphagia. Start Protonix once each morning.

## 2017-05-02 NOTE — Assessment & Plan Note (Signed)
71 year old female with history of Crohn's disease (question of possible Crohn's vs NSAID effect and lack of follow-up in 2011), with official diagnosis in 2016 noting abnormal cecum and IC valve consistent with IBD. TI intubated and normal. Insurance has not covered Lialda and only covered Colazal. She was prescribed entocort for a presumed flare in June with taper thereafter. Felt to have an IBS overlay as well, with improvement taking Bentyl historically. However, her symptoms have not been ideal, food choices are limited due to diarrhea, and colonoscopy has been recommended for diagnostic purposes and evaluation histologically. CRP, sed rate have been normal recently. She has also had negative stool studies, normal TSH, negative celiac serologies. Some improvement with Entocort, but her symptoms continue to wax and wane.   Proceed with TCS with Dr. Gala Romney in near future: the risks, benefits, and alternatives have been discussed with the patient in detail. The patient states understanding and desires to proceed. Follow-up in office thereafter

## 2017-05-02 NOTE — Progress Notes (Signed)
Referring Provider: Dettinger, Fransisca Kaufmann, MD Primary Care Physician:  Dettinger, Fransisca Kaufmann, MD Primary GI: Dr. Gala Romney   Chief Complaint  Patient presents with  . Crohn's Disease    HPI:   Felicia Pugh is a 71 y.o. female presenting today with a history of Crohn's, with last colonoscopy in 2016 with abnormal cecum and IC valve most consistent with IBD. TI intubated and appeared normal. Insurance did not approve Lialda and covered Colazal. She has chronic anemia with normal iron/ferritin. 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 DEXA on file this year. At visit in June was given Entocort 9 mg for 4 weeks with taper thereafter, compounded by [employer]. Stool studies negative. Celiac serologies negative, TSH on file normal.  CT Dec 2017 with unremarkable small bowel. She is here to update H&P for colonoscopy, as she had to cancel previously due to other health issues (thyroid biopsy).   States today "the diarrhea is checking up". States it started "checking up" after she finished the entocort taper. Now only going 2-3 times a day. Improved consistency. More firm this week. Bentyl QID. Doesn't eat many starches due to diabetes. Vegetables tend to worsen her diarrhea. Diabetes is not as well controlled as she has difficulty finding what works well for her.    Has a lot of burping. No dysphagia. Stays hoarse off and on, coughs a lot.   Will be seeing pulmonology end of this month due to pulmonary nodule. Thyroid nodule is benign. Stopped Vit D, Multivitamin, and iron in preparation for colonoscopy.   Past Medical History:  Diagnosis Date  . 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 Cataract   . Complication of anesthesia    difficulty breathing after waking up from anesthesia  . Crohn's disease ([place])   . Diabetes mellitus without complication ([place])   . Diabetic neuropathy ([place])   . Hyperlipidemia   . Hypertension     . IBS (irritable bowel syndrome)   . Stroke [facility]) 2011  . Syncope and collapse   . Thyroid nodule   . Ulcer     Past Surgical History:  Procedure Laterality Date  . ABDOMINAL HYSTERECTOMY     partial  . BACK SURGERY     x3 (Dr Loyola Mast, Dr Patrice Paradise)  . BREAST SURGERY Right 2002   cyst removed  . COLONOSCOPY  10/102006   RMR: normal  . COLONOSCOPY  09/06/2009   RMR: Suboptimal prep on the right side. Ulcers noted at cecum and in the descending colon. Terminal ileum could not be intubated. Ounces with chronic active colitis, question Crohn's  . COLONOSCOPY N/A 03/27/2015   Dr. Gala Romney: abnormal cecum and IC valve most consistent with IBD. TI intubated and appeared normal. Likely Crohn's disease. Patient denies NSAID use. No improvement with Lialda samples at time of initial diagnosis  . ESOPHAGOGASTRODUODENOSCOPY    . LAPAROSCOPIC APPENDECTOMY N/A 07/21/2016   Procedure: APPENDECTOMY LAPAROSCOPIC;  Surgeon: Aviva Signs, MD;  Location: [facility];  Service: General;  Laterality: N/A;  . Small bowel capsule endoscopy  06/10/2005   RMR: single erosion versus AVM, distal ileum  . Small bowel capsule endoscopy  prior to 05/2005   [place]: reported erosions/ulcerations per medical record. actual report unavailable.    Current Outpatient Prescriptions  Medication Sig Dispense Refill  . atorvastatin (LIPITOR) 40 MG tablet Take 1 tablet (40 mg total) by mouth daily. (Patient taking differently: Take  40 mg by mouth every evening. ) 90 tablet 3  . balsalazide (COLAZAL) 750 MG capsule Take 3 capsules (2,250 mg total) by mouth 3 (three) times daily. 270 capsule 3  . dicyclomine (BENTYL) 10 MG capsule TAKE ONE CAPSULE BY MOUTH 4 TIMES DAILY WITH MEALS AND AT BEDTIME AS NEEDED FOR  CRAMPS  AND  DIARRHEA (Patient taking differently: Take 10 mg by mouth 4 (four) times daily. ) 120 capsule 3  . Ferrous Sulfate (IRON) 325 (65 Fe) MG TABS Take 1 tablet by mouth daily.    . Insulin NPH Human, Isophane, (NOVOLIN N  [place]) Inject 10-20 Units into the skin 2 (two) times daily. 20 UNITS IN THE MORNING & 10 UNITS AT NIGHT    . Insulin Syringes, Disposable, U-100 0.5 ML MISC Give insulin twice daily as directed 100 each 1  . lisinopril-hydrochlorothiazide (PRINZIDE,ZESTORETIC) 20-12.5 MG tablet Take 1 tablet by mouth daily. 90 tablet 3  . Multiple Vitamin (MULTIVITAMIN) tablet Take 1 tablet by mouth daily.    Glory Rosebush VERIO test strip USE 1 STRIP TO CHECK GLUCOSE TWICE DAILY 100 each 2  . sitaGLIPtin (JANUVIA) 100 MG tablet Take 1 tablet (100 mg total) by mouth daily. 21 tablet 0  . Na Sulfate-K Sulfate-Mg Sulf (SUPREP BOWEL PREP KIT) 17.5-3.13-1.6 GM/180ML SOLN Take 1 kit by mouth as directed. (Patient not taking: Reported on 03/20/2017) 1 Bottle 0   No current facility-administered medications for this visit.     Allergies as of 05/02/2017 - Review Complete 05/02/2017  Allergen Reaction Noted  . Metformin and related  05/11/2014  . Asa [aspirin]  11/04/2012  . Nsaids  11/04/2012  . Other Diarrhea 12/25/2014    Family History  Problem Relation Age of Onset  . Hypertension Mother   . Stroke Mother   . Diabetes Father        w/ BKA  . Hypertension Father   . Cancer Sister   . Diabetes Sister        stomach  . Diabetes Brother   . Diabetes Sister   . Cancer Sister        breast  . Hypertension Sister   . Hypertension Daughter   . Hypertension Son   . Multiple sclerosis Son   . Colon cancer Neg Hx   . Inflammatory bowel disease Neg Hx     Social History   Social History  . Marital status: Married    Spouse name: N/A  . Number of children: 5  . Years of education: N/A   Social History Main Topics  . Smoking status: Never Smoker  . Smokeless tobacco: Never Used  . Alcohol use No  . Drug use: No  . Sexual activity: No   Other Topics Concern  . None   Social History Narrative  . None    Review of Systems: Gen: Denies fever, chills, anorexia. Denies fatigue, weakness, weight  loss.  CV: Denies chest pain, palpitations, syncope, peripheral edema, and claudication. Resp: +chronic cough  GI: see HPI  Derm: Denies rash, itching, dry skin Psych: Denies depression, anxiety, memory loss, confusion. No homicidal or suicidal ideation.  Heme: Denies bruising, bleeding, and enlarged lymph nodes.  Physical Exam: BP 131/80   Pulse 95   Temp 97.6 F (36.4 C) (Oral)   Ht 5' 2.5" (1.588 m)   Wt 179 lb 9.6 oz (81.5 kg)   BMI 32.33 kg/m  General:   Alert and oriented. No distress noted. Pleasant and cooperative.  Head:  Normocephalic and atraumatic. Eyes:  Conjuctiva clear without scleral icterus. Mouth:  Oral mucosa pink and moist. Good dentition. No lesions. Abdomen:  +BS, soft, non-tender and non-distended. No rebound or guarding. No HSM or masses noted. Msk:  Symmetrical without gross deformities. Normal posture. Extremities:  Without edema. Neurologic:  Alert and  oriented x4 Psych:  Alert and cooperative. Normal mood and affect.  Lab Results  Component Value Date   WBC 10.5 03/17/2017   HGB 11.4 (L) 03/17/2017   HCT 35.9 (L) 03/17/2017   MCV 90.0 03/17/2017   PLT 253 03/17/2017   Lab Results  Component Value Date   ALT 10 12/16/2016   AST 19 12/16/2016   ALKPHOS 139 (H) 12/16/2016   BILITOT 0.3 12/16/2016    Lab Results  Component Value Date   IRON 103 10/23/2016   TIBC 272 10/23/2016   FERRITIN 261 10/23/2016

## 2017-05-03 DIAGNOSIS — R69 Illness, unspecified: Secondary | ICD-10-CM | POA: Diagnosis not present

## 2017-05-05 NOTE — Progress Notes (Signed)
CC'D TO PCP °

## 2017-05-07 ENCOUNTER — Encounter: Payer: Self-pay | Admitting: Internal Medicine

## 2017-05-07 ENCOUNTER — Ambulatory Visit (INDEPENDENT_AMBULATORY_CARE_PROVIDER_SITE_OTHER): Payer: Medicare HMO | Admitting: Internal Medicine

## 2017-05-07 DIAGNOSIS — K219 Gastro-esophageal reflux disease without esophagitis: Secondary | ICD-10-CM | POA: Diagnosis not present

## 2017-05-07 DIAGNOSIS — E669 Obesity, unspecified: Secondary | ICD-10-CM | POA: Diagnosis not present

## 2017-05-07 DIAGNOSIS — I10 Essential (primary) hypertension: Secondary | ICD-10-CM

## 2017-05-07 DIAGNOSIS — R911 Solitary pulmonary nodule: Secondary | ICD-10-CM

## 2017-05-07 DIAGNOSIS — R058 Other specified cough: Secondary | ICD-10-CM

## 2017-05-07 DIAGNOSIS — R490 Dysphonia: Secondary | ICD-10-CM | POA: Insufficient documentation

## 2017-05-07 DIAGNOSIS — R05 Cough: Secondary | ICD-10-CM

## 2017-05-07 MED ORDER — LOSARTAN POTASSIUM-HCTZ 100-25 MG PO TABS: 1.0000 | ORAL_TABLET | Freq: Every day | ORAL | 11 refills | Status: DC

## 2017-05-07 NOTE — Patient Instructions (Addendum)
Protononix (pantoprazole) 40  Mg Take 30- 60 min before your first and last meals of the day   Stop lisinopril and start losartan 100-25 one daily in its place  GERD (REFLUX)  is an extremely common cause of respiratory symptoms just like yours , many times with no obvious heartburn at all.    It can be treated with medication, but also with lifestyle changes including elevation of the head of your bed (ideally with 6 inch  bed blocks),  Smoking cessation, avoidance of late meals, excessive alcohol, and avoid fatty foods, chocolate, peppermint, colas, red wine, and acidic juices such as orange juice.  NO MINT OR MENTHOL PRODUCTS SO NO COUGH DROPS   USE SUGARLESS CANDY INSTEAD (Jolley ranchers or Stover's or Life Savers) or even ice chips will also do - the key is to swallow to prevent all throat clearing. NO OIL BASED VITAMINS - use powdered substitutes.     If you are satisfied with your treatment plan,  let your doctor know and he/she can either refill your medications or you can return here when your prescription runs out.     If in any way you are not 100% satisfied,  please tell us.  If 100% better, tell your friends!  Pulmonary follow up is as needed

## 2017-05-07 NOTE — Progress Notes (Signed)
Subjective:     Patient ID: Felicia Pugh, female   DOB: [birth year],   MRN: [id number]  HPI  71 yobf  Never smoker with new onset sob/hoarseness > ER eval > 88m nodule  RML on CT 03/17/17 and ent/thyroid eval / GI Eval > started PPI x one week prior to OV   but no better so referred to pulmonary clinic 05/07/2017 by Dr   Felicia Pugh    [place] [facility] [facility] [place]   Chief Complaint  Patient presents with  . Pulmonary Consult    Referred by Dr. DWarrick Pugh  Pt c/o SOB x 2 months- occurs with exertion such as walking to her mailbox and back. She also get SOB when she lies down.  She also c/o cough that comes and goes-  mostly non prod   variable mostly dry cough / sob, globus sensation x 2 months, indolent onset daily >> noct symptoms with neg w/u to date while on ACEi / feels choked when lies down but fine once asleep no change on gerd rx to date but only been a week  No obvious day to day or daytime variability or assoc excess/ purulent sputum or mucus plugs or hemoptysis or cp or chest tightness, subjective wheeze or overt sinus or hb symptoms. No unusual exp hx or h/o childhood pna/ asthma or knowledge of premature birth.  Sleeping ok one pillow without nocturnal  or early am exacerbation  of respiratory  c/o's or need for noct saba. Also denies any obvious fluctuation of symptoms with weather or environmental changes or other aggravating or alleviating factors except as outlined above   Current Allergies, Complete Past Medical History, Past Surgical History, Family History, and Social History were reviewed in [facility].  ROS  The following are not active complaints unless bolded sore throat, dysphagia (globus) , dental problems, itching, sneezing,  nasal congestion or disharge of excess mucus or purulent secretions, ear ache,   fever, chills, sweats, unintended wt loss or wt gain, classically pleuritic or exertional cp,  orthopnea pnd or leg  swelling, presyncope, palpitations, abdominal pain, anorexia, nausea, vomiting, diarrhea  or change in bowel habits or bladder habits, change in stools or change in urine, dysuria, hematuria,  rash, arthralgias, visual complaints, headache, numbness, weakness or ataxia or problems with walking or coordination,  change in mood/affect or memory.        Current Meds  Medication Sig  . atorvastatin (LIPITOR) 40 MG tablet Take 1 tablet (40 mg total) by mouth daily. (Patient taking differently: Take 40 mg by mouth every evening. )  . balsalazide (COLAZAL) 750 MG capsule Take 3 capsules (2,250 mg total) by mouth 3 (three) times daily.  . Calcium Carbonate-Vitamin D (CALCIUM 500 + D) 500-125 MG-UNIT TABS Take 1 tablet by mouth daily.  .Marland Kitchendicyclomine (BENTYL) 10 MG capsule TAKE ONE CAPSULE BY MOUTH 4 TIMES DAILY WITH MEALS AND AT BEDTIME AS NEEDED FOR  CRAMPS  AND  DIARRHEA (Patient taking differently: Take 10 mg by mouth 4 (four) times daily. )  . Insulin NPH Human, Isophane, (NOVOLIN N Logan) Inject 10-20 Units into the skin 2 (two) times daily. 20 UNITS IN THE MORNING & 10 UNITS AT NIGHT  . Insulin Syringes, Disposable, U-100 0.5 ML MISC Give insulin twice daily as directed  . ONETOUCH VERIO test strip USE 1 STRIP TO CHECK GLUCOSE TWICE DAILY  . pantoprazole (PROTONIX) 40 MG tablet Take 1 tablet (40 mg total) by mouth daily.  Take 30 minutes before breakfast daily  . sitaGLIPtin (JANUVIA) 100 MG tablet Take 1 tablet (100 mg total) by mouth daily.  [name] therapeutic multivitamin-minerals (THERAGRAN-M) tablet Take 1 tablet by mouth daily.  . [  lisinopril-hydrochlorothiazide (PRINZIDE,ZESTORETIC) 20-12.5 MG tablet Take 1 tablet by mouth daily.        Review of Systems     Objective:   Physical Exam amb obese bf nad with classic voice fatigue   Wt Readings from Last 3 Encounters:  05/07/17 180 lb (81.6 kg)  05/02/17 179 lb 9.6 oz (81.5 kg)  05/01/17 187 lb (84.8 kg)    Vital signs reviewed  - Note on  arrival 02 sats  98% on RA      HEENT: nl dentition, turbinates bilaterally, and oropharynx. Nl external ear canals without cough reflex   NECK :  without JVD/Nodes/TM/ nl carotid upstrokes bilaterally   LUNGS: no acc muscle use,  Nl contour chest which is clear to A and P bilaterally without cough on insp or exp maneuvers   CV:  RRR  no s3 or murmur or increase in P2, and no edema   ABD:  soft and nontender with nl inspiratory excursion in the supine position. No bruits or organomegaly appreciated, bowel sounds nl  MS:  Nl gait/ ext warm without deformities, calf tenderness, cyanosis or clubbing No obvious joint restrictions   SKIN: warm and dry without lesions    NEURO:  alert, approp, nl sensorium with  no motor or cerebellar deficits apparent.      I personally reviewed images and agree with radiology impression as follows:   Chest CT a  03/17/17  Neg pe/ Stable 5 mm nodule noted in right middle lobe. No follow-up needed if patient is low-risk     Assessment:

## 2017-05-08 DIAGNOSIS — R058 Other specified cough: Secondary | ICD-10-CM | POA: Insufficient documentation

## 2017-05-08 DIAGNOSIS — R911 Solitary pulmonary nodule: Secondary | ICD-10-CM | POA: Insufficient documentation

## 2017-05-08 DIAGNOSIS — E669 Obesity, unspecified: Secondary | ICD-10-CM | POA: Insufficient documentation

## 2017-05-08 DIAGNOSIS — R05 Cough: Secondary | ICD-10-CM | POA: Insufficient documentation

## 2017-05-08 NOTE — Assessment & Plan Note (Signed)
Body mass index is 32.4 kg/m.  -    Lab Results  Component Value Date   TSH 0.902 03/20/2017     Contributing to gerd risk/ doe/reviewed the need and the process to achieve and maintain neg calorie balance > defer f/u primary care including intermittently monitoring thyroid status

## 2017-05-08 NOTE — Assessment & Plan Note (Signed)
Upper airway cough syndrome (previously labeled PNDS),  is so named because it's frequently impossible to sort out how much is  CR/sinusitis with freq throat clearing (which can be related to primary GERD)   vs  causing  secondary (" extra esophageal")  GERD from wide swings in gastric pressure that occur with throat clearing, often  promoting self use of mint and menthol lozenges that reduce the lower esophageal sphincter tone and exacerbate the problem further in a cyclical fashion.   These are the same pts (now being labeled as having "irritable larynx syndrome" by some cough centers) who not infrequently have a history of having failed to tolerate ace inhibitors,  dry powder inhalers or biphosphonates or report having atypical/extraesophageal reflux symptoms that don't respond to standard doses of PPI  and are easily confused as having aecopd or asthma flares by even experienced allergists/ pulmonologists (myself included).   Of the three most common causes of  Sub-acute or recurrent or chronic cough, only one (GERD)  can actually contribute to/ trigger  the other two (asthma and post nasal drip syndrome)  and perpetuate the cylce of cough.  While not intuitively obvious, many patients with chronic low grade reflux do not cough until there is a primary insult that disturbs the protective epithelial barrier and exposes sensitive nerve endings.   This is typically viral but can be direct physical injury such as with an endotracheal tube and certainly aggravated immensely in setting of elevated bradykinins on ACEi .   The point is that once this occurs, it is difficult to eliminate the cycle  using anything but a maximally effective acid suppression regimen at least in the short run, accompanied by an appropriate diet to address non acid GERD and eliminate ACEi > see hbp   Total time devoted to counseling  > 50 % of initial 60 min office visit:  review case with pt/ discussion of options/alternatives/  personally creating written customized instructions  in presence of pt  then going over those specific  Instructions directly with the pt including how to use all of the meds but in particular covering each new medication in detail and the difference between the maintenance= "automatic" meds and the prns using an action plan format for the latter (If this problem/symptom => do that organization reading Left to right).  Please see AVS from this visit for a full list of these instructions which I personally wrote for this pt and  are unique to this visit.

## 2017-05-08 NOTE — Assessment & Plan Note (Signed)
In the best review of chronic cough to date ( NEJM 2016 375 [phone]) ,  ACEi are now felt to cause cough in up to  20% of pts which is a 4 fold increase from previous reports and does not include the variety of non-specific complaints we see in pulmonary clinic in pts on ACEi but previously attributed to another dx like  Copd/asthma and  include PNDS, throat congestion, "bronchitis", unexplained dyspnea and noct "strangling" sensations, and hoarseness, but also  atypical /refractory GERD symptoms like severe dysphagia and "bad heartburn"   The only way I know  to prove this is not an "ACEi Case" is a trial off ACEi x a minimum of 6 weeks then regroup.   Try losartan 100-25 x 6 weeks and return here if not better to resume the w/u , back to pcp if better

## 2017-05-08 NOTE — Assessment & Plan Note (Signed)
CT 03/17/17 x 5 mm nodule > never smoker so low risk > no directed f/u indicated by Fleischner society criteria  CT results reviewed with pt >>> Too small for PET or bx, not suspicious enough for excisional bx > really only option for now is follow the Fleischner society guidelines as rec by radiology.   Discussed in detail all the  indications, usual  risks and alternatives  relative to the benefits with patient who agrees to proceed with conservative f/u as outlined  > no directed f/u

## 2017-05-09 ENCOUNTER — Institutional Professional Consult (permissible substitution): Payer: Medicare HMO | Admitting: Internal Medicine

## 2017-05-15 ENCOUNTER — Ambulatory Visit (HOSPITAL_COMMUNITY)
Admission: RE | Admit: 2017-05-15 | Discharge: 2017-05-15 | Disposition: A | Discharge status: Home / Self Care | Payer: Medicare HMO | Source: Ambulatory Visit | Attending: Internal Medicine | Admitting: Internal Medicine

## 2017-05-15 ENCOUNTER — Encounter (HOSPITAL_COMMUNITY)
Admission: RE | Disposition: A | Discharge status: Home / Self Care | Payer: Self-pay | Source: Ambulatory Visit | Attending: Internal Medicine

## 2017-05-15 ENCOUNTER — Encounter (HOSPITAL_COMMUNITY): Payer: Self-pay

## 2017-05-15 DIAGNOSIS — K641 Second degree hemorrhoids: Secondary | ICD-10-CM

## 2017-05-15 DIAGNOSIS — D649 Anemia, unspecified: Secondary | ICD-10-CM | POA: Diagnosis not present

## 2017-05-15 DIAGNOSIS — I10 Essential (primary) hypertension: Secondary | ICD-10-CM | POA: Diagnosis not present

## 2017-05-15 DIAGNOSIS — R05 Cough: Secondary | ICD-10-CM | POA: Insufficient documentation

## 2017-05-15 DIAGNOSIS — K529 Noninfective gastroenteritis and colitis, unspecified: Secondary | ICD-10-CM | POA: Diagnosis not present

## 2017-05-15 DIAGNOSIS — R490 Dysphonia: Secondary | ICD-10-CM | POA: Insufficient documentation

## 2017-05-15 DIAGNOSIS — Z888 Allergy status to other drugs, medicaments and biological substances status: Secondary | ICD-10-CM | POA: Insufficient documentation

## 2017-05-15 DIAGNOSIS — Z8673 Personal history of transient ischemic attack (TIA), and cerebral infarction without residual deficits: Secondary | ICD-10-CM | POA: Diagnosis not present

## 2017-05-15 DIAGNOSIS — E785 Hyperlipidemia, unspecified: Secondary | ICD-10-CM | POA: Insufficient documentation

## 2017-05-15 DIAGNOSIS — E041 Nontoxic single thyroid nodule: Secondary | ICD-10-CM | POA: Insufficient documentation

## 2017-05-15 DIAGNOSIS — Z1211 Encounter for screening for malignant neoplasm of colon: Secondary | ICD-10-CM | POA: Diagnosis not present

## 2017-05-15 DIAGNOSIS — Z886 Allergy status to analgesic agent status: Secondary | ICD-10-CM | POA: Diagnosis not present

## 2017-05-15 DIAGNOSIS — K50919 Crohn's disease, unspecified, with unspecified complications: Secondary | ICD-10-CM

## 2017-05-15 DIAGNOSIS — Z794 Long term (current) use of insulin: Secondary | ICD-10-CM | POA: Diagnosis not present

## 2017-05-15 DIAGNOSIS — R911 Solitary pulmonary nodule: Secondary | ICD-10-CM | POA: Insufficient documentation

## 2017-05-15 DIAGNOSIS — Z79899 Other long term (current) drug therapy: Secondary | ICD-10-CM | POA: Insufficient documentation

## 2017-05-15 DIAGNOSIS — K5080 Crohn's disease of both small and large intestine without complications: Secondary | ICD-10-CM | POA: Diagnosis not present

## 2017-05-15 DIAGNOSIS — K5730 Diverticulosis of large intestine without perforation or abscess without bleeding: Secondary | ICD-10-CM | POA: Diagnosis not present

## 2017-05-15 DIAGNOSIS — E1140 Type 2 diabetes mellitus with diabetic neuropathy, unspecified: Secondary | ICD-10-CM | POA: Diagnosis not present

## 2017-05-15 HISTORY — PX: COLONOSCOPY: SHX5424

## 2017-05-15 LAB — GLUCOSE, CAPILLARY: Glucose-Capillary: 254 mg/dL — ABNORMAL HIGH (ref 65–99)

## 2017-05-15 SURGERY — COLONOSCOPY
Anesthesia: Moderate Sedation

## 2017-05-15 MED ORDER — MEPERIDINE HCL 100 MG/ML IJ SOLN
INTRAMUSCULAR | Status: DC | PRN
  Administered 2017-05-15 (×2): 25 mg via INTRAVENOUS

## 2017-05-15 MED ORDER — MIDAZOLAM HCL 5 MG/5ML IJ SOLN
INTRAMUSCULAR | Status: DC | PRN
  Administered 2017-05-15: 1 mg via INTRAVENOUS
  Administered 2017-05-15: 2 mg via INTRAVENOUS

## 2017-05-15 MED ORDER — ONDANSETRON HCL 4 MG/2ML IJ SOLN
INTRAMUSCULAR | Status: AC
  Filled 2017-05-15: qty 2

## 2017-05-15 MED ORDER — SODIUM CHLORIDE 0.9 % IV SOLN
INTRAVENOUS | Status: DC
  Administered 2017-05-15: 08:00:00 via INTRAVENOUS

## 2017-05-15 MED ORDER — MEPERIDINE HCL 100 MG/ML IJ SOLN
INTRAMUSCULAR | Status: AC
  Filled 2017-05-15: qty 2

## 2017-05-15 MED ORDER — ONDANSETRON HCL 4 MG/2ML IJ SOLN
INTRAMUSCULAR | Status: DC | PRN
  Administered 2017-05-15: 4 mg via INTRAVENOUS

## 2017-05-15 MED ORDER — MIDAZOLAM HCL 5 MG/5ML IJ SOLN
INTRAMUSCULAR | Status: AC
  Filled 2017-05-15: qty 10

## 2017-05-15 NOTE — Op Note (Signed)
[facility] Patient Name: Felicia Pugh Procedure Date: 05/15/2017 8:00 AM MRN: [id number] Date of Birth: [birth year] Attending MD: Norvel Richards , MD CSN: [id number] Age: 71 Admit Type: Outpatient Procedure:                Colonoscopy Indications:              High risk colon cancer surveillance: Crohn's small                            and large intestine Providers:                Norvel Richards, MD, Lurline Del, RN, Selena Lesser, Aram Candela Referring MD:              Medicines:                Midazolam 3 mg IV, Meperidine 50 mg IV Complications:            No immediate complications. Estimated Blood Loss:     Estimated blood loss was minimal. Procedure:                Pre-Anesthesia Assessment:                           - Prior to the procedure, a History and Physical                            was performed, and patient medications and                            allergies were reviewed. The patient's tolerance of                            previous anesthesia was also reviewed. The risks                            and benefits of the procedure and the sedation                            options and risks were discussed with the patient.                            All questions were answered, and informed consent                            was obtained. Prior Anticoagulants: The patient has                            taken no previous anticoagulant or antiplatelet                            agents. ASA Grade Assessment: II - A patient with  mild systemic disease. After reviewing the risks                            and benefits, the patient was deemed in                            satisfactory condition to undergo the procedure.                           After obtaining informed consent, the colonoscope                            was passed under direct vision. Throughout the   procedure, the patient's blood pressure, pulse, and                            oxygen saturations were monitored continuously. The                            EC-[id number] ([id number]) scope was introduced through                            the anus and advanced to the the cecum, identified                            by the ileocecal valve. The colonoscopy was                            performed without difficulty. The patient tolerated                            the procedure well. The quality of the bowel                            preparation was adequate. The terminal ileum,                            ileocecal valve, appendiceal orifice, and rectum                            were photographed. Scope In: 8:39:14 AM Scope Out: 8:55:42 AM Scope Withdrawal Time: 0 hours 9 minutes 15 seconds  Total Procedure Duration: 0 hours 16 minutes 28 seconds  Findings:      The perianal and digital rectal examinations were normal.      Scattered small and large-mouthed diverticula were found in the sigmoid       colon and descending colon. Ileocecal valve opening appeared granular       and inflamed with loss of the normal vascular pattern and this was quite       localized. Distal 5 cm of terminal ileum appeared normal, however. This       was biopsied with a cold forceps for histology. Estimated blood loss was       minimal.      The exam was otherwise without abnormality on direct and retroflexion  views.      Non-bleeding internal hemorrhoids were found during retroflexion. The       hemorrhoids were Grade II (internal hemorrhoids that prolapse but reduce       spontaneously). Impression:               abnormal ileocecal valve?"biopsied                           - Diverticulosis in the sigmoid colon and in the                            descending colon. The examination was otherwise                            normal on direct and retroflexion views.                           - Non-bleeding  internal hemorrhoids. Moderate Sedation:      Moderate (conscious) sedation was administered by the endoscopy nurse       and supervised by the endoscopist. The following parameters were       monitored: oxygen saturation, heart rate, blood pressure, respiratory       rate, EKG, adequacy of pulmonary ventilation, and response to care.       Total physician intraservice time was 24 minutes. Recommendation:           - Patient has a contact number available for                            emergencies. The signs and symptoms of potential                            delayed complications were discussed with the                            patient. Return to normal activities tomorrow.                            Written discharge instructions were provided to the                            patient.                           - Resume previous diet.                           - Continue present medications.                           - Repeat colonoscopy for surveillance based on                            pathology results.                           - Return to GI office (date not yet determined). Procedure  Code(s):        --- Professional ---                           [phone], Colonoscopy, flexible; with biopsy, single                            or multiple                           99152, Moderate sedation services provided by the                            same physician or other qualified health care                            professional performing the diagnostic or                            therapeutic service that the sedation supports,                            requiring the presence of an independent trained                            observer to assist in the monitoring of the                            patient's level of consciousness and physiological                            status; initial 15 minutes of intraservice time,                            patient age 22 years or older                            [phone], Moderate sedation services; each additional                            15 minutes intraservice time Diagnosis Code(s):        --- Professional ---                           K50.80, Crohn's disease of both small and large                            intestine without complications                           K64.1, Second degree hemorrhoids                           K57.30, Diverticulosis of large intestine without  perforation or abscess without bleeding CPT copyright 2016 American Medical Association. All rights reserved. The codes documented in this report are preliminary and upon coder review may  be revised to meet current compliance requirements. Cristopher Estimable. Jazz Biddy, MD Norvel Richards, MD 05/15/2017 9:15:57 AM This report has been signed electronically. Number of Addenda: 0

## 2017-05-15 NOTE — Discharge Instructions (Addendum)
Colonoscopy Discharge Instructions  Read the instructions outlined below and refer to this sheet in the next few weeks. These discharge instructions provide you with general information on caring for yourself after you leave the hospital. Your doctor may also give you specific instructions. While your treatment has been planned according to the most current medical practices available, unavoidable complications occasionally occur. If you have any problems or questions after discharge, call Dr. Gala Romney at [phone]. ACTIVITY  You may resume your regular activity, but move at a slower pace for the next 24 hours.   Take frequent rest periods for the next 24 hours.   Walking will help get rid of the air and reduce the bloated feeling in your belly (abdomen).   No driving for 24 hours (because of the medicine (anesthesia) used during the test).    Do not sign any important legal documents or operate any machinery for 24 hours (because of the anesthesia used during the test).  NUTRITION  Drink plenty of fluids.   You may resume your normal diet as instructed by your doctor.   Begin with a light meal and progress to your normal diet. Heavy or fried foods are harder to digest and may make you feel sick to your stomach (nauseated).   Avoid alcoholic beverages for 24 hours or as instructed.  MEDICATIONS  You may resume your normal medications unless your doctor tells you otherwise.  WHAT YOU CAN EXPECT TODAY  Some feelings of bloating in the abdomen.   Passage of more gas than usual.   Spotting of blood in your stool or on the toilet paper.  IF YOU HAD POLYPS REMOVED DURING THE COLONOSCOPY:  No aspirin products for 7 days or as instructed.   No alcohol for 7 days or as instructed.   Eat a soft diet for the next 24 hours.  FINDING OUT THE RESULTS OF YOUR TEST Not all test results are available during your visit. If your test results are not back during the visit, make an appointment  with your caregiver to find out the results. Do not assume everything is normal if you have not heard from your caregiver or the medical facility. It is important for you to follow up on all of your test results.  SEEK IMMEDIATE MEDICAL ATTENTION IF:  You have more than a spotting of blood in your stool.   Your belly is swollen (abdominal distention).   You are nauseated or vomiting.   You have a temperature over 101.   You have abdominal pain or discomfort that is severe or gets worse throughout the day.    Diverticulosis information provided  Further recommendations to follow pending review of pathology report     Diverticulosis Diverticulosis is a condition that develops when small pouches (diverticula) form in the wall of the large intestine (colon). The colon is where water is absorbed and stool is formed. The pouches form when the inside layer of the colon pushes through weak spots in the outer layers of the colon. You may have a few pouches or many of them. What are the causes? The cause of this condition is not known. What increases the risk? The following factors may make you more likely to develop this condition:  Being older than age 29. Your risk for this condition increases with age. Diverticulosis is rare among people younger than age 56. By age 52, many people have it.  Eating a low-fiber diet.  Having frequent constipation.  Being overweight.  Not getting enough exercise.  Smoking.  Taking over-the-counter pain medicines, like aspirin and ibuprofen.  Having a family history of diverticulosis.  What are the signs or symptoms? In most people, there are no symptoms of this condition. If you do have symptoms, they may include:  Bloating.  Cramps in the abdomen.  Constipation or diarrhea.  Pain in the lower left side of the abdomen.  How is this diagnosed? This condition is most often diagnosed during an exam for other colon problems. Because  diverticulosis usually has no symptoms, it often cannot be diagnosed independently. This condition may be diagnosed by:  Using a flexible scope to examine the colon (colonoscopy).  Taking an X-ray of the colon after dye has been put into the colon (barium enema).  Doing a CT scan.  How is this treated? You may not need treatment for this condition if you have never developed an infection related to diverticulosis. If you have had an infection before, treatment may include:  Eating a high-fiber diet. This may include eating more fruits, vegetables, and grains.  Taking a fiber supplement.  Taking a live bacteria supplement (probiotic).  Taking medicine to relax your colon.  Taking antibiotic medicines.  Follow these instructions at home:  Drink 6-8 glasses of water or more each day to prevent constipation.  Try not to strain when you have a bowel movement.  If you have had an infection before: ? Eat more fiber as directed by your health care provider or your diet and nutrition specialist (dietitian). ? Take a fiber supplement or probiotic, if your health care provider approves.  Take over-the-counter and prescription medicines only as told by your health care provider.  If you were prescribed an antibiotic, take it as told by your health care provider. Do not stop taking the antibiotic even if you start to feel better.  Keep all follow-up visits as told by your health care provider. This is important. Contact a health care provider if:  You have pain in your abdomen.  You have bloating.  You have cramps.  You have not had a bowel movement in 3 days. Get help right away if:  Your pain gets worse.  Your bloating becomes very bad.  You have a fever or chills, and your symptoms suddenly get worse.  You vomit.  You have bowel movements that are bloody or black.  You have bleeding from your rectum. Summary  Diverticulosis is a condition that develops when small  pouches (diverticula) form in the wall of the large intestine (colon).  You may have a few pouches or many of them.  This condition is most often diagnosed during an exam for other colon problems.  If you have had an infection related to diverticulosis, treatment may include increasing the fiber in your diet, taking supplements, or taking medicines. This information is not intended to replace advice given to you by your health care provider. Make sure you discuss any questions you have with your health care provider. Document Released: 04/25/2004 Document Revised: 06/17/2016 Document Reviewed: 06/17/2016 Elsevier Interactive Patient Education  2017 [employer].

## 2017-05-15 NOTE — H&P (View-Only) (Signed)
Referring Provider: Dettinger, Fransisca Kaufmann, MD Primary Care Physician:  Dettinger, Fransisca Kaufmann, MD Primary GI: Dr. Gala Romney   Chief Complaint  Patient presents with  . Crohn's Disease    HPI:   Felicia Pugh is a 71 y.o. female presenting today with a history of Crohn's, with last colonoscopy in 2016 with abnormal cecum and IC valve most consistent with IBD. TI intubated and appeared normal. Insurance did not approve Lialda and covered Colazal. She has chronic anemia with normal iron/ferritin. 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 DEXA on file this year. At visit in June was given Entocort 9 mg for 4 weeks with taper thereafter, compounded by [employer]. Stool studies negative. Celiac serologies negative, TSH on file normal.  CT Dec 2017 with unremarkable small bowel. She is here to update H&P for colonoscopy, as she had to cancel previously due to other health issues (thyroid biopsy).   States today "the diarrhea is checking up". States it started "checking up" after she finished the entocort taper. Now only going 2-3 times a day. Improved consistency. More firm this week. Bentyl QID. Doesn't eat many starches due to diabetes. Vegetables tend to worsen her diarrhea. Diabetes is not as well controlled as she has difficulty finding what works well for her.    Has a lot of burping. No dysphagia. Stays hoarse off and on, coughs a lot.   Will be seeing pulmonology end of this month due to pulmonary nodule. Thyroid nodule is benign. Stopped Vit D, Multivitamin, and iron in preparation for colonoscopy.   Past Medical History:  Diagnosis Date  . 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 Cataract   . Complication of anesthesia    difficulty breathing after waking up from anesthesia  . Crohn's disease ([place])   . Diabetes mellitus without complication ([place])   . Diabetic neuropathy ([place])   . Hyperlipidemia   . Hypertension     . IBS (irritable bowel syndrome)   . Stroke [facility]) 2011  . Syncope and collapse   . Thyroid nodule   . Ulcer     Past Surgical History:  Procedure Laterality Date  . ABDOMINAL HYSTERECTOMY     partial  . BACK SURGERY     x3 (Dr Loyola Mast, Dr Patrice Paradise)  . BREAST SURGERY Right 2002   cyst removed  . COLONOSCOPY  10/102006   RMR: normal  . COLONOSCOPY  09/06/2009   RMR: Suboptimal prep on the right side. Ulcers noted at cecum and in the descending colon. Terminal ileum could not be intubated. Ounces with chronic active colitis, question Crohn's  . COLONOSCOPY N/A 03/27/2015   Dr. Gala Romney: abnormal cecum and IC valve most consistent with IBD. TI intubated and appeared normal. Likely Crohn's disease. Patient denies NSAID use. No improvement with Lialda samples at time of initial diagnosis  . ESOPHAGOGASTRODUODENOSCOPY    . LAPAROSCOPIC APPENDECTOMY N/A 07/21/2016   Procedure: APPENDECTOMY LAPAROSCOPIC;  Surgeon: Aviva Signs, MD;  Location: [facility];  Service: General;  Laterality: N/A;  . Small bowel capsule endoscopy  06/10/2005   RMR: single erosion versus AVM, distal ileum  . Small bowel capsule endoscopy  prior to 05/2005   [place]: reported erosions/ulcerations per medical record. actual report unavailable.    Current Outpatient Prescriptions  Medication Sig Dispense Refill  . atorvastatin (LIPITOR) 40 MG tablet Take 1 tablet (40 mg total) by mouth daily. (Patient taking differently: Take  40 mg by mouth every evening. ) 90 tablet 3  . balsalazide (COLAZAL) 750 MG capsule Take 3 capsules (2,250 mg total) by mouth 3 (three) times daily. 270 capsule 3  . dicyclomine (BENTYL) 10 MG capsule TAKE ONE CAPSULE BY MOUTH 4 TIMES DAILY WITH MEALS AND AT BEDTIME AS NEEDED FOR  CRAMPS  AND  DIARRHEA (Patient taking differently: Take 10 mg by mouth 4 (four) times daily. ) 120 capsule 3  . Ferrous Sulfate (IRON) 325 (65 Fe) MG TABS Take 1 tablet by mouth daily.    . Insulin NPH Human, Isophane, (NOVOLIN N  [place]) Inject 10-20 Units into the skin 2 (two) times daily. 20 UNITS IN THE MORNING & 10 UNITS AT NIGHT    . Insulin Syringes, Disposable, U-100 0.5 ML MISC Give insulin twice daily as directed 100 each 1  . lisinopril-hydrochlorothiazide (PRINZIDE,ZESTORETIC) 20-12.5 MG tablet Take 1 tablet by mouth daily. 90 tablet 3  . Multiple Vitamin (MULTIVITAMIN) tablet Take 1 tablet by mouth daily.    Glory Rosebush VERIO test strip USE 1 STRIP TO CHECK GLUCOSE TWICE DAILY 100 each 2  . sitaGLIPtin (JANUVIA) 100 MG tablet Take 1 tablet (100 mg total) by mouth daily. 21 tablet 0  . Na Sulfate-K Sulfate-Mg Sulf (SUPREP BOWEL PREP KIT) 17.5-3.13-1.6 GM/180ML SOLN Take 1 kit by mouth as directed. (Patient not taking: Reported on 03/20/2017) 1 Bottle 0   No current facility-administered medications for this visit.     Allergies as of 05/02/2017 - Review Complete 05/02/2017  Allergen Reaction Noted  . Metformin and related  05/11/2014  . Asa [aspirin]  11/04/2012  . Nsaids  11/04/2012  . Other Diarrhea 12/25/2014    Family History  Problem Relation Age of Onset  . Hypertension Mother   . Stroke Mother   . Diabetes Father        w/ BKA  . Hypertension Father   . Cancer Sister   . Diabetes Sister        stomach  . Diabetes Brother   . Diabetes Sister   . Cancer Sister        breast  . Hypertension Sister   . Hypertension Daughter   . Hypertension Son   . Multiple sclerosis Son   . Colon cancer Neg Hx   . Inflammatory bowel disease Neg Hx     Social History   Social History  . Marital status: Married    Spouse name: N/A  . Number of children: 5  . Years of education: N/A   Social History Main Topics  . Smoking status: Never Smoker  . Smokeless tobacco: Never Used  . Alcohol use No  . Drug use: No  . Sexual activity: No   Other Topics Concern  . None   Social History Narrative  . None    Review of Systems: Gen: Denies fever, chills, anorexia. Denies fatigue, weakness, weight  loss.  CV: Denies chest pain, palpitations, syncope, peripheral edema, and claudication. Resp: +chronic cough  GI: see HPI  Derm: Denies rash, itching, dry skin Psych: Denies depression, anxiety, memory loss, confusion. No homicidal or suicidal ideation.  Heme: Denies bruising, bleeding, and enlarged lymph nodes.  Physical Exam: BP 131/80   Pulse 95   Temp 97.6 F (36.4 C) (Oral)   Ht 5' 2.5" (1.588 m)   Wt 179 lb 9.6 oz (81.5 kg)   BMI 32.33 kg/m  General:   Alert and oriented. No distress noted. Pleasant and cooperative.  Head:  Normocephalic and atraumatic. Eyes:  Conjuctiva clear without scleral icterus. Mouth:  Oral mucosa pink and moist. Good dentition. No lesions. Abdomen:  +BS, soft, non-tender and non-distended. No rebound or guarding. No HSM or masses noted. Msk:  Symmetrical without gross deformities. Normal posture. Extremities:  Without edema. Neurologic:  Alert and  oriented x4 Psych:  Alert and cooperative. Normal mood and affect.  Lab Results  Component Value Date   WBC 10.5 03/17/2017   HGB 11.4 (L) 03/17/2017   HCT 35.9 (L) 03/17/2017   MCV 90.0 03/17/2017   PLT 253 03/17/2017   Lab Results  Component Value Date   ALT 10 12/16/2016   AST 19 12/16/2016   ALKPHOS 139 (H) 12/16/2016   BILITOT 0.3 12/16/2016    Lab Results  Component Value Date   IRON 103 10/23/2016   TIBC 272 10/23/2016   FERRITIN 261 10/23/2016

## 2017-05-15 NOTE — Interval H&P Note (Signed)
History and Physical Interval Note:  05/15/2017 8:28 AM  Felicia Pugh  has presented today for surgery, with the diagnosis of Crohn's disease  The various methods of treatment have been discussed with the patient and family. After consideration of risks, benefits and other options for treatment, the patient has consented to  Procedure(s) with comments: COLONOSCOPY (N/A) - 9:30am as a surgical intervention .  The patient's history has been reviewed, patient examined, no change in status, stable for surgery.  I have reviewed the patient's chart and labs.  Questions were answered to the patient's satisfaction.     No change for diagnostic colonoscopy per plan.  The risks, benefits, limitations, alternatives and imponderables have been reviewed with the patient. Questions have been answered. All parties are agreeable.  Manus Rudd

## 2017-05-19 ENCOUNTER — Encounter (HOSPITAL_COMMUNITY): Payer: Self-pay | Admitting: Internal Medicine

## 2017-05-23 ENCOUNTER — Encounter: Payer: Self-pay | Admitting: Internal Medicine

## 2017-06-05 DIAGNOSIS — K219 Gastro-esophageal reflux disease without esophagitis: Secondary | ICD-10-CM | POA: Diagnosis not present

## 2017-06-20 ENCOUNTER — Ambulatory Visit (INDEPENDENT_AMBULATORY_CARE_PROVIDER_SITE_OTHER): Payer: Medicare HMO | Admitting: Gastroenterology

## 2017-06-20 ENCOUNTER — Encounter: Payer: Self-pay | Admitting: Gastroenterology

## 2017-06-20 ENCOUNTER — Telehealth: Payer: Self-pay | Admitting: Family Medicine

## 2017-06-20 ENCOUNTER — Other Ambulatory Visit: Payer: Self-pay | Admitting: Family Medicine

## 2017-06-20 VITALS — BP 142/80 | HR 88 | Temp 96.9°F | Ht 62.5 in | Wt 179.0 lb

## 2017-06-20 DIAGNOSIS — E119 Type 2 diabetes mellitus without complications: Secondary | ICD-10-CM

## 2017-06-20 DIAGNOSIS — K5090 Crohn's disease, unspecified, without complications: Secondary | ICD-10-CM

## 2017-06-20 NOTE — Assessment & Plan Note (Signed)
71 year old with recent colonoscopy on file, doing well at this point on Colazal (insurance covered this but not other agents). Bowel function improved. Bentyl QID. No alarm symptoms. Weight stable. Return in 6 months.

## 2017-06-20 NOTE — Patient Instructions (Signed)
I am glad you are doing well!  I will see you in 6 months!  Have a wonderful Thanksgiving and Christmas!

## 2017-06-20 NOTE — Telephone Encounter (Signed)
Pt aware - we have samples - given

## 2017-06-20 NOTE — Progress Notes (Signed)
Referring Provider: Dettinger, Fransisca Kaufmann, MD Primary Care Physician:  Dettinger, Fransisca Kaufmann, MD  Primary GI: Dr. Gala Romney    Chief Complaint  Patient presents with  . post procedure    tcs f/u-doing ok    HPI:   Felicia Pugh is a 71 y.o. female presenting today with a history of Crohn's. Recent colonoscopy updated with focal active ileitis. Otherwise normal colon. Insurance previously did not approve Lialda but covered Colazal. Bentyl for IBS overlay. DEXA on file this year.   Saw Dr. Janace Hoard and had PPI increased to BID due to hoarseness. She was also taken off of a BP medication that was felt to be causing this. GERD improved.   Diarrhea much improved. Some days might just go two times. More consistency. Appetite is good. Weight is stable. Bentyl QID.   Past Medical History:  Diagnosis Date  . 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 Cataract   . Complication of anesthesia    difficulty breathing after waking up from anesthesia  . Crohn's disease ([place])   . Diabetes mellitus without complication ([place])   . Diabetic neuropathy ([place])   . Hyperlipidemia   . Hypertension   . IBS (irritable bowel syndrome)   . Stroke [facility]) 2011  . Syncope and collapse   . Thyroid nodule   . Ulcer     Past Surgical History:  Procedure Laterality Date  . ABDOMINAL HYSTERECTOMY     partial  . BACK SURGERY     x3 (Dr Loyola Mast, Dr Patrice Paradise)  . BREAST SURGERY Right 2002   cyst removed  . COLONOSCOPY  10/102006   RMR: normal  . COLONOSCOPY  09/06/2009   RMR: Suboptimal prep on the right side. Ulcers noted at cecum and in the descending colon. Terminal ileum could not be intubated. Ounces with chronic active colitis, question Crohn's  . ESOPHAGOGASTRODUODENOSCOPY    . Small bowel capsule endoscopy  06/10/2005   RMR: single erosion versus AVM, distal ileum  . Small bowel capsule endoscopy  prior to 05/2005   [place]: reported  erosions/ulcerations per medical record. actual report unavailable.    Current Outpatient Medications  Medication Sig Dispense Refill  . atorvastatin (LIPITOR) 40 MG tablet Take 1 tablet (40 mg total) by mouth daily. (Patient taking differently: Take 40 mg by mouth every evening. ) 90 tablet 3  . balsalazide (COLAZAL) 750 MG capsule Take 3 capsules (2,250 mg total) by mouth 3 (three) times daily. 270 capsule 3  . Calcium Carbonate-Vitamin D (CALCIUM 500 + D) 500-125 MG-UNIT TABS Take 1 tablet by mouth daily.    [name] dicyclomine (BENTYL) 10 MG capsule TAKE ONE CAPSULE BY MOUTH 4 TIMES DAILY WITH MEALS AND AT BEDTIME AS NEEDED FOR  CRAMPS  AND  DIARRHEA (Patient taking differently: Take 10 mg by mouth 4 (four) times daily. ) 120 capsule 3  . Ferrous Sulfate (IRON) 325 (65 Fe) MG TABS Take daily by mouth.    . Insulin NPH Human, Isophane, (NOVOLIN N [place]) Inject 10-20 Units into the skin 2 (two) times daily. 20 UNITS IN THE MORNING & 10 UNITS AT NIGHT    . Insulin Syringes, Disposable, U-100 0.5 ML MISC Give insulin twice daily as directed 100 each 1  . losartan-hydrochlorothiazide (HYZAAR) 100-25 MG tablet Take 1 tablet by mouth daily. 30 tablet 11  . ONETOUCH VERIO test strip USE 1 STRIP TO CHECK GLUCOSE TWICE DAILY  100 each 2  . pantoprazole (PROTONIX) 40 MG tablet Take 1 tablet (40 mg total) by mouth daily. Take 30 minutes before breakfast daily (Patient taking differently: Take 40 mg 2 (two) times daily by mouth. Take 30 minutes before breakfast daily) 90 tablet 3  . sitaGLIPtin (JANUVIA) 100 MG tablet Take 1 tablet (100 mg total) by mouth daily. 21 tablet 0  . therapeutic multivitamin-minerals (THERAGRAN-M) tablet Take 1 tablet by mouth daily.     No current facility-administered medications for this visit.     Allergies as of 06/20/2017 - Review Complete 06/20/2017  Allergen Reaction Noted  . Metformin and related  05/11/2014  . Asa [aspirin]  11/04/2012  . Nsaids  11/04/2012  . Other  Diarrhea 12/25/2014    Family History  Problem Relation Age of Onset  . Hypertension Mother   . Stroke Mother   . Diabetes Father        w/ BKA  . Hypertension Father   . Asthma Father   . Cancer Sister   . Diabetes Sister        stomach  . Diabetes Brother   . Diabetes Sister   . Cancer Sister        breast  . Hypertension Sister   . Hypertension Daughter   . Hypertension Son   . Multiple sclerosis Son   . Colon cancer Neg Hx   . Inflammatory bowel disease Neg Hx     Social History   Socioeconomic History  . Marital status: Married    Spouse name: None  . Number of children: 5  . Years of education: None  . Highest education level: None  Social Needs  . Financial resource strain: None  . Food insecurity - worry: None  . Food insecurity - inability: None  . Transportation needs - medical: None  . Transportation needs - non-medical: None  Occupational History  . None  Tobacco Use  . Smoking status: Never Smoker  . Smokeless tobacco: Never Used  Substance and Sexual Activity  . Alcohol use: No  . Drug use: No  . Sexual activity: No  Other Topics Concern  . None  Social History Narrative  . None    Review of Systems: Gen: Denies fever, chills, anorexia. Denies fatigue, weakness, weight loss.  CV: Denies chest pain, palpitations, syncope, peripheral edema, and claudication. Resp: Denies dyspnea at rest, cough, wheezing, coughing up blood, and pleurisy. GI: see HPI  Derm: Denies rash, itching, dry skin Psych: Denies depression, anxiety, memory loss, confusion. No homicidal or suicidal ideation.  Heme: Denies bruising, bleeding, and enlarged lymph nodes.  Physical Exam: BP (!) 142/80   Pulse 88   Temp (!) 96.9 F (36.1 C) (Oral)   Ht 5' 2.5" (1.588 m)   Wt 179 lb (81.2 kg)   BMI 32.22 kg/m  General:   Alert and oriented. No distress noted. Pleasant and cooperative.  Head:  Normocephalic and atraumatic. Eyes:  Conjuctiva clear without scleral  icterus. Mouth:  Oral mucosa pink and moist.  Abdomen:  +BS, soft, non-tender and non-distended. No rebound or guarding. No HSM or masses noted. Msk:  Symmetrical without gross deformities. Normal posture. Extremities:  Without edema. Neurologic:  Alert and  oriented x4 Psych:  Alert and cooperative. Normal mood and affect.

## 2017-06-25 DIAGNOSIS — K219 Gastro-esophageal reflux disease without esophagitis: Secondary | ICD-10-CM | POA: Diagnosis not present

## 2017-06-26 ENCOUNTER — Ambulatory Visit: Payer: Medicare HMO | Admitting: Family Medicine

## 2017-06-26 ENCOUNTER — Encounter: Payer: Self-pay | Admitting: Family Medicine

## 2017-06-26 VITALS — BP 132/81 | HR 80 | Temp 97.2°F | Ht 62.5 in | Wt 180.0 lb

## 2017-06-26 DIAGNOSIS — Z23 Encounter for immunization: Secondary | ICD-10-CM | POA: Diagnosis not present

## 2017-06-26 DIAGNOSIS — E669 Obesity, unspecified: Secondary | ICD-10-CM | POA: Diagnosis not present

## 2017-06-26 DIAGNOSIS — K219 Gastro-esophageal reflux disease without esophagitis: Secondary | ICD-10-CM | POA: Diagnosis not present

## 2017-06-26 DIAGNOSIS — E1159 Type 2 diabetes mellitus with other circulatory complications: Secondary | ICD-10-CM | POA: Diagnosis not present

## 2017-06-26 DIAGNOSIS — E785 Hyperlipidemia, unspecified: Secondary | ICD-10-CM

## 2017-06-26 DIAGNOSIS — I152 Hypertension secondary to endocrine disorders: Secondary | ICD-10-CM

## 2017-06-26 DIAGNOSIS — I10 Essential (primary) hypertension: Secondary | ICD-10-CM | POA: Diagnosis not present

## 2017-06-26 DIAGNOSIS — E1169 Type 2 diabetes mellitus with other specified complication: Secondary | ICD-10-CM | POA: Diagnosis not present

## 2017-06-26 DIAGNOSIS — E119 Type 2 diabetes mellitus without complications: Secondary | ICD-10-CM

## 2017-06-26 LAB — BAYER DCA HB A1C WAIVED: HB A1C: 9.4 % — AB (ref <7.0)

## 2017-06-26 MED ORDER — EMPAGLIFLOZIN 10 MG PO TABS: 10.0000 mg | ORAL_TABLET | Freq: Every day | ORAL | 3 refills | Status: DC

## 2017-06-26 NOTE — Progress Notes (Signed)
BP 132/81   Pulse 80   Temp (!) 97.2 F (36.2 C) (Oral)   Ht 5' 2.5" (1.588 m)   Wt 180 lb (81.6 kg)   BMI 32.40 kg/m    Subjective:    Patient ID: Felicia Pugh, female    DOB: 31-Jan-1946, 71 y.o.   MRN: [id number]  HPI: Felicia Pugh is a 71 y.o. female presenting on 06/26/2017 for Diabetes (3 mo follow up; patient's blood sugar has been in the 200's when she gets up in the mornings); Hyperlipidemia; and Hypertension   HPI Type 2 diabetes mellitus Patient comes in today for recheck of his diabetes. Patient has been currently taking Januvia and Novolin NPH 20 a.m. and 10 PM.  Her blood sugars have been running over 200 consistently over the past 3 months in the a.m. and about 180 or 190 in the p.m. patient is currently on an ACE inhibitor/ARB. Patient has seen an ophthalmologist this year. Patient denies any issues with their feet.   Hyperlipidemia Patient is coming in for recheck of his hyperlipidemia. The patient is currently taking Lipitor. They deny any issues with myalgias or history of liver damage from it. They deny any focal numbness or weakness or chest pain.   GERD Patient is currently on pantoprazole.  She denies any major symptoms or abdominal pain or belching or burping. She denies any blood in her stool or lightheadedness or dizziness.  Hypertension Patient is currently on losartan-hydrochlorothiazide, and their blood pressure today is 132/81. Patient denies any lightheadedness or dizziness. Patient denies headaches, blurred vision, chest pains, shortness of breath, or weakness. Denies any side effects from medication and is content with current medication.   Relevant past medical, surgical, family and social history reviewed and updated as indicated. Interim medical history since our last visit reviewed. Allergies and medications reviewed and updated.  Review of Systems  Constitutional: Negative for chills and fever.  HENT: Negative for congestion, ear  discharge and ear pain.   Eyes: Negative for redness and visual disturbance.  Respiratory: Negative for chest tightness and shortness of breath.   Cardiovascular: Negative for chest pain and leg swelling.  Gastrointestinal: Negative for abdominal pain.  Genitourinary: Negative for difficulty urinating and dysuria.  Musculoskeletal: Negative for back pain and gait problem.  Skin: Negative for rash.  Neurological: Negative for dizziness, light-headedness and headaches.  Psychiatric/Behavioral: Negative for agitation, behavioral problems, decreased concentration and sleep disturbance.  All other systems reviewed and are negative.   Per HPI unless specifically indicated above        Objective:    BP 132/81   Pulse 80   Temp (!) 97.2 F (36.2 C) (Oral)   Ht 5' 2.5" (1.588 m)   Wt 180 lb (81.6 kg)   BMI 32.40 kg/m   Wt Readings from Last 3 Encounters:  06/26/17 180 lb (81.6 kg)  06/20/17 179 lb (81.2 kg)  05/07/17 180 lb (81.6 kg)    Physical Exam  Constitutional: She is oriented to person, place, and time. She appears well-developed and well-nourished. No distress.  Eyes: Conjunctivae are normal.  Neck: Neck supple. No thyromegaly present.  Cardiovascular: Normal rate, regular rhythm, normal heart sounds and intact distal pulses.  No murmur heard. Pulmonary/Chest: Effort normal and breath sounds normal. No respiratory distress. She has no wheezes.  Musculoskeletal: Normal range of motion. She exhibits no edema.  Lymphadenopathy:    She has no cervical adenopathy.  Neurological: She is alert and oriented to person, place,  and time. Coordination normal.  Skin: Skin is warm and dry. No rash noted. She is not diaphoretic.  Psychiatric: She has a normal mood and affect. Her behavior is normal.  Nursing note and vitals reviewed.       Assessment & Plan:   Problem List Items Addressed This Visit      Cardiovascular and Mediastinum   Hypertension associated with diabetes  ([place])   Relevant Medications   empagliflozin (JARDIANCE) 10 MG TABS tablet     Digestive   GERD (gastroesophageal reflux disease)     Endocrine   Hyperlipidemia associated with type 2 diabetes mellitus (H[facility])   Relevant Medications   empagliflozin (JARDIANCE) 10 MG TABS tablet   Other Relevant Orders   Lipid panel   Type 2 diabetes mellitus without complication, without long-term current use of insulin (H[facility]) - Primary   Relevant Medications   empagliflozin (JARDIANCE) 10 MG TABS tablet   Other Relevant Orders   Bayer DCA Hb A1c Waived   CMP14+EGFR     Other   Obesity (BMI 30-39.9)   Relevant Medications   empagliflozin (JARDIANCE) 10 MG TABS tablet    Other Visit Diagnoses    Need for immunization against influenza       Relevant Orders   Flu Vaccine QUAD 36+ mos IM (Completed)      Increase NPH to 25 units a.m. and 20 units p.m. and will try to get her Jardiance if possible.  Follow up plan: Return in about 3 months (around 09/26/2017), or if symptoms worsen or fail to improve, for Recheck diabetes and hypertension and cholesterol.  Counseling provided for all of the vaccine components Orders Placed This Encounter  Procedures  . Flu Vaccine QUAD 36+ mos IM    Caryl Pina, MD [facility] 06/26/2017, 9:05 AM

## 2017-06-27 LAB — LIPID PANEL
CHOL/HDL RATIO: 2.3 ratio (ref 0.0–4.4)
Cholesterol, Total: 196 mg/dL (ref 100–199)
HDL: 84 mg/dL (ref >39)
LDL CALC: 100 mg/dL — AB (ref 0–99)
TRIGLYCERIDES: 59 mg/dL (ref 0–149)
VLDL Cholesterol Cal: 12 mg/dL (ref 5–40)

## 2017-06-27 LAB — CMP14+EGFR
ALBUMIN: 4.4 g/dL (ref 3.5–4.8)
ALT: 8 IU/L (ref 0–32)
AST: 25 IU/L (ref 0–40)
Albumin/Globulin Ratio: 1.6 (ref 1.2–2.2)
Alkaline Phosphatase: 146 IU/L — ABNORMAL HIGH (ref 39–117)
BUN / CREAT RATIO: 18 (ref 12–28)
BUN: 14 mg/dL (ref 8–27)
Bilirubin Total: 0.3 mg/dL (ref 0.0–1.2)
CALCIUM: 9.6 mg/dL (ref 8.7–10.3)
CO2: 27 mmol/L (ref 20–29)
CREATININE: 0.8 mg/dL (ref 0.57–1.00)
Chloride: 102 mmol/L (ref 96–106)
GFR calc Af Amer: 86 mL/min/{1.73_m2} (ref >59)
GFR, EST NON AFRICAN AMERICAN: 74 mL/min/{1.73_m2} (ref >59)
GLOBULIN, TOTAL: 2.7 g/dL (ref 1.5–4.5)
Glucose: 98 mg/dL (ref 65–99)
Potassium: 4.2 mmol/L (ref 3.5–5.2)
SODIUM: 143 mmol/L (ref 134–144)
TOTAL PROTEIN: 7.1 g/dL (ref 6.0–8.5)

## 2017-07-07 ENCOUNTER — Telehealth: Payer: Self-pay

## 2017-07-07 NOTE — Telephone Encounter (Signed)
Request from [facility] for refill on Pantoprazole 40 mg. It was prescribed once a day and pt taking bid. Please send in appropriate refill.

## 2017-07-09 MED ORDER — PANTOPRAZOLE SODIUM 40 MG PO TBEC: 40.0000 mg | DELAYED_RELEASE_TABLET | Freq: Every day | ORAL | 3 refills | Status: DC

## 2017-07-09 NOTE — Telephone Encounter (Signed)
Patient written for once daily. I recommend she try taking once daily. If break through reflux, we could increase to bid for a period of time.

## 2017-07-09 NOTE — Telephone Encounter (Signed)
LMOM to call.

## 2017-07-09 NOTE — Addendum Note (Signed)
Addended by: Mahala Menghini on: 07/09/2017 12:19 PM   Modules accepted: Orders

## 2017-07-09 NOTE — Telephone Encounter (Signed)
PT is aware.

## 2017-07-14 ENCOUNTER — Other Ambulatory Visit: Payer: Self-pay | Admitting: Gastroenterology

## 2017-07-17 ENCOUNTER — Telehealth: Payer: Self-pay | Admitting: Internal Medicine

## 2017-07-17 DIAGNOSIS — K50919 Crohn's disease, unspecified, with unspecified complications: Secondary | ICD-10-CM

## 2017-07-17 NOTE — Telephone Encounter (Signed)
Pt is requesting a refill of her Dicyclomine 74m, last ov was 06/20/17, pharmacy- [facility] [place]

## 2017-07-17 NOTE — Telephone Encounter (Signed)
Pt called to say that she needs a refill on her dicyclomine called into [facility]

## 2017-07-21 MED ORDER — DICYCLOMINE HCL 10 MG PO CAPS: 10.0000 mg | ORAL_CAPSULE | Freq: Four times a day (QID) | ORAL | 3 refills | Status: DC

## 2017-07-21 NOTE — Addendum Note (Signed)
Addended by: Gordy Levan, ERIC A on: 07/21/2017 10:46 AM   Modules accepted: Orders

## 2017-07-21 NOTE — Telephone Encounter (Signed)
Rx sent to pharmacy per patient request

## 2017-08-07 DIAGNOSIS — R69 Illness, unspecified: Secondary | ICD-10-CM | POA: Diagnosis not present

## 2017-08-15 ENCOUNTER — Telehealth: Payer: Self-pay | Admitting: Family Medicine

## 2017-08-15 ENCOUNTER — Other Ambulatory Visit: Payer: Self-pay

## 2017-08-15 DIAGNOSIS — E119 Type 2 diabetes mellitus without complications: Secondary | ICD-10-CM

## 2017-08-15 MED ORDER — EMPAGLIFLOZIN 10 MG PO TABS: 10.0000 mg | ORAL_TABLET | Freq: Every day | ORAL | 4 refills | Status: DC

## 2017-08-15 NOTE — Telephone Encounter (Signed)
Aware,  No samples.  Patient wants provider to consider a cheaper medicine,  She can not afford Januvia and can not get samples each time she runs out. Please advise.

## 2017-08-15 NOTE — Telephone Encounter (Signed)
We have samples of Januvia tell her to come pick these up, they are in Dr. Merita Norton office

## 2017-08-15 NOTE — Telephone Encounter (Signed)
Dr dettinger had samples which we placed up front, patient aware

## 2017-09-01 ENCOUNTER — Telehealth: Payer: Self-pay | Admitting: Internal Medicine

## 2017-09-01 NOTE — Telephone Encounter (Signed)
Spoke with pt. Pt hasn't contacted the pharmacy as of yet to see why her medication Balsalazide 716m is expensive. Pt was asked to call the pharmacy to see why there was an increase in her medication and then give me a call back so I can help her.  Waiting on a response from the pt.

## 2017-09-01 NOTE — Telephone Encounter (Signed)
Her insurance plan may have changed (tier, coverage, etc). See if she can get a list of what is covered under her plan. Pharmacy may know as well.

## 2017-09-01 NOTE — Telephone Encounter (Signed)
PATIENT CALLED BACK AND STATED THE PRESCRIPTION WAS HIGH DUE TO  HER DEDUCTIBLE.

## 2017-09-01 NOTE — Telephone Encounter (Signed)
Tried calling the pharmacy, no answer the first time, left a voicemail the second time. Waiting on pharmacy to contact us back through fax. Will f/u if fax isn't sent.

## 2017-09-01 NOTE — Telephone Encounter (Signed)
Pts medication is $135.00 and pt is concerned with the cost. Is there another medication similar to Colazal? I can inquire from the pharmacy if there is another medication close to it.

## 2017-09-01 NOTE — Telephone Encounter (Signed)
[place] WENT UP IN COST   PLEASE CALL HER ASAP

## 2017-09-04 NOTE — Telephone Encounter (Signed)
Spoke with pt. Pt picked medication up and charged it on her charge card. Will call pts pharmacy back if list hasn't come in.

## 2017-09-04 NOTE — Telephone Encounter (Signed)
Lmom, waiting on a return call.  

## 2017-09-23 ENCOUNTER — Telehealth: Payer: Self-pay | Admitting: Internal Medicine

## 2017-09-23 NOTE — Telephone Encounter (Signed)
Medication has been brought down to a tier 1 after speaking with pts insurance company. See other phone note.

## 2017-09-23 NOTE — Telephone Encounter (Signed)
Spoke with pts insurance company and pts medication was brought down from a tier 4 to a tier 1. Pts medication will be a tier 08-11-18. Pt notified.

## 2017-09-23 NOTE — Telephone Encounter (Signed)
[place] PATIENT ABOUT HER PRESCRIPTION PRICE  AFTER DEDUCTIBLE IT WILL STILL BE 100$

## 2017-09-23 NOTE — Telephone Encounter (Signed)
Spoke with [employer]. They do not have a reason for why pts medication is so high. Medication was $150.49, which is too high for pt to pay monthly. Called pt and asked her to call the Pharmacy Benefit number on the back of her insurance card and ask them id she is going to have to pay $150.49 for each rx, is medication hight due to a deductible. PT will call me back when she finds out some info.

## 2017-09-23 NOTE — Telephone Encounter (Signed)
Spoke with pt contact number for pts insurance is [phone]. Pt was told that the medication would drop only $50. PT doesn't have any other info about over medications that would be covered. I will contact pts insurance.

## 2017-09-29 ENCOUNTER — Ambulatory Visit (INDEPENDENT_AMBULATORY_CARE_PROVIDER_SITE_OTHER): Payer: Medicare HMO | Admitting: Family Medicine

## 2017-09-29 ENCOUNTER — Encounter: Payer: Self-pay | Admitting: Family Medicine

## 2017-09-29 VITALS — BP 133/78 | HR 76 | Temp 97.0°F | Ht 62.5 in | Wt 179.0 lb

## 2017-09-29 DIAGNOSIS — E1159 Type 2 diabetes mellitus with other circulatory complications: Secondary | ICD-10-CM

## 2017-09-29 DIAGNOSIS — I10 Essential (primary) hypertension: Secondary | ICD-10-CM | POA: Diagnosis not present

## 2017-09-29 DIAGNOSIS — E785 Hyperlipidemia, unspecified: Secondary | ICD-10-CM

## 2017-09-29 DIAGNOSIS — E119 Type 2 diabetes mellitus without complications: Secondary | ICD-10-CM | POA: Diagnosis not present

## 2017-09-29 DIAGNOSIS — I152 Hypertension secondary to endocrine disorders: Secondary | ICD-10-CM

## 2017-09-29 DIAGNOSIS — E1169 Type 2 diabetes mellitus with other specified complication: Secondary | ICD-10-CM

## 2017-09-29 DIAGNOSIS — K219 Gastro-esophageal reflux disease without esophagitis: Secondary | ICD-10-CM

## 2017-09-29 LAB — BAYER DCA HB A1C WAIVED: HB A1C (BAYER DCA - WAIVED): 7.6 % — ABNORMAL HIGH (ref <7.0)

## 2017-09-29 MED ORDER — LINAGLIPTIN 5 MG PO TABS: 5.0000 mg | ORAL_TABLET | Freq: Every day | ORAL | 3 refills | Status: DC

## 2017-09-29 NOTE — Progress Notes (Signed)
BP 133/78   Pulse 76   Temp (!) 97 F (36.1 C) (Oral)   Ht 5' 2.5" (1.588 m)   Wt 179 lb (81.2 kg)   BMI 32.22 kg/m    Subjective:    Patient ID: KARLIN BINION, female    DOB: 02-05-1946, 72 y.o.   MRN: [id number]  HPI: Felicia Pugh is a 72 y.o. female presenting on 09/29/2017 for Diabetes (3 mo); Hyperlipidemia; and Hypertension   HPI Type 2 diabetes mellitus Patient comes in today for recheck of his diabetes. Patient has been currently taking insulin NPH 20 in the a.m. and 12-18 in the p.m.  And Januvia. Patient is currently on an ACE inhibitor/ARB. Patient has not seen an ophthalmologist this year. Patient denies any issues with their feet.  Patient said that Januvia was going to cost her a significant amount of money and is wondering if there is any other one that would be cheaper.  Hyperlipidemia Patient is coming in for recheck of his hyperlipidemia. The patient is currently taking Lipitor. They deny any issues with myalgias or history of liver damage from it. They deny any focal numbness or weakness or chest pain.   Hypertension Patient is currently on losartan-hydrochlorothiazide, and their blood pressure today is 133/78. Patient denies any lightheadedness or dizziness. Patient denies headaches, blurred vision, chest pains, shortness of breath, or weakness. Denies any side effects from medication and is content with current medication.   GERD Patient is currently on protonix, patient sees GI.  She denies any major symptoms or abdominal pain or belching or burping. She denies any blood in her stool or lightheadedness or dizziness.   Relevant past medical, surgical, family and social history reviewed and updated as indicated. Interim medical history since our last visit reviewed. Allergies and medications reviewed and updated.  Review of Systems  Constitutional: Negative for chills and fever.  Eyes: Negative for visual disturbance.  Respiratory: Negative for chest  tightness and shortness of breath.   Cardiovascular: Negative for chest pain and leg swelling.  Gastrointestinal: Negative for abdominal pain.  Musculoskeletal: Negative for back pain and gait problem.  Skin: Negative for rash.  Neurological: Negative for dizziness, light-headedness and headaches.  Psychiatric/Behavioral: Negative for agitation and behavioral problems.  All other systems reviewed and are negative.   Per HPI unless specifically indicated above   Allergies as of 09/29/2017      Reactions   Metformin And Related    Severe diarrhea   Asa [aspirin]    GI Dr does not want her to take due to hx ulcers   Nsaids    GI Dr does not want her to take due to hx of ulcers   Other Diarrhea   Steroids      Medication List        Accurate as of 09/29/17  9:56 AM. Always use your most recent med list.          atorvastatin 40 MG tablet Commonly known as:  LIPITOR Take 1 tablet (40 mg total) by mouth daily.   balsalazide 750 MG capsule Commonly known as:  COLAZAL Take 3 capsules (2,250 mg total) by mouth 3 (three) times daily.   CALCIUM 500 + D 500-125 MG-UNIT Tabs Generic drug:  Calcium Carbonate-Vitamin D Take 1 tablet by mouth daily.   dicyclomine 10 MG capsule Commonly known as:  BENTYL Take 1 capsule (10 mg total) by mouth 4 (four) times daily.   empagliflozin 10 MG Tabs tablet Commonly  known as:  JARDIANCE Take 10 mg by mouth daily.   Insulin Syringes (Disposable) U-100 0.5 ML Misc Give insulin twice daily as directed   Iron 325 (65 Fe) MG Tabs Take daily by mouth.   linagliptin 5 MG Tabs tablet Commonly known as:  TRADJENTA Take 1 tablet (5 mg total) by mouth daily.   losartan-hydrochlorothiazide 100-25 MG tablet Commonly known as:  HYZAAR Take 1 tablet by mouth daily.   NOVOLIN N [place] Inject 10-20 Units into the skin 2 (two) times daily. 20 UNITS IN THE MORNING & 10 UNITS AT NIGHT   ONETOUCH VERIO test strip Generic drug:  glucose blood USE 1  STRIP TO CHECK GLUCOSE TWICE DAILY   pantoprazole 40 MG tablet Commonly known as:  PROTONIX Take 1 tablet (40 mg total) by mouth daily before breakfast.   sitaGLIPtin 100 MG tablet Commonly known as:  JANUVIA Take 1 tablet (100 mg total) by mouth daily.   therapeutic multivitamin-minerals tablet Take 1 tablet by mouth daily.          Objective:    BP 133/78   Pulse 76   Temp (!) 97 F (36.1 C) (Oral)   Ht 5' 2.5" (1.588 m)   Wt 179 lb (81.2 kg)   BMI 32.22 kg/m   Wt Readings from Last 3 Encounters:  09/29/17 179 lb (81.2 kg)  06/26/17 180 lb (81.6 kg)  06/20/17 179 lb (81.2 kg)    Physical Exam  Constitutional: She is oriented to person, place, and time. She appears well-developed and well-nourished. No distress.  Eyes: Conjunctivae are normal.  Neck: Neck supple. No thyromegaly present.  Cardiovascular: Normal rate, regular rhythm, normal heart sounds and intact distal pulses.  No murmur heard. Pulmonary/Chest: Effort normal and breath sounds normal. No respiratory distress. She has no wheezes. She has no rales.  Abdominal: Soft. Bowel sounds are normal. She exhibits no distension. There is no tenderness. There is no rebound.  Musculoskeletal: Normal range of motion. She exhibits no edema or tenderness.  Lymphadenopathy:    She has no cervical adenopathy.  Neurological: She is alert and oriented to person, place, and time. Coordination normal.  Skin: Skin is warm and dry. No rash noted. She is not diaphoretic.  Psychiatric: She has a normal mood and affect. Her behavior is normal.  Nursing note and vitals reviewed.       Assessment & Plan:   Problem List Items Addressed This Visit      Cardiovascular and Mediastinum   Hypertension associated with diabetes ([place])   Relevant Medications   linagliptin (TRADJENTA) 5 MG TABS tablet     Digestive   GERD (gastroesophageal reflux disease)     Endocrine   Hyperlipidemia associated with type 2 diabetes mellitus  (HCC)   Relevant Medications   linagliptin (TRADJENTA) 5 MG TABS tablet   Type 2 diabetes mellitus without complication, without long-term current use of insulin (HCC) - Primary   Relevant Medications   linagliptin (TRADJENTA) 5 MG TABS tablet   Other Relevant Orders   Bayer DCA Hb A1c Waived (Completed)       Follow up plan: Return in about 3 months (around 12/27/2017), or if symptoms worsen or fail to improve, for Diabetes and hypertension.  Counseling provided for all of the vaccine components Orders Placed This Encounter  Procedures  . [employer] Hb A1c [place], MD [facility] 09/29/2017, 9:56 AM

## 2017-10-08 DIAGNOSIS — H40013 Open angle with borderline findings, low risk, bilateral: Secondary | ICD-10-CM | POA: Diagnosis not present

## 2017-10-08 DIAGNOSIS — E109 Type 1 diabetes mellitus without complications: Secondary | ICD-10-CM | POA: Diagnosis not present

## 2017-10-08 DIAGNOSIS — I10 Essential (primary) hypertension: Secondary | ICD-10-CM | POA: Diagnosis not present

## 2017-10-08 DIAGNOSIS — Z0100 Encounter for examination of eyes and vision without abnormal findings: Secondary | ICD-10-CM | POA: Diagnosis not present

## 2017-10-08 LAB — HM DIABETES EYE EXAM

## 2017-10-09 ENCOUNTER — Telehealth: Payer: Self-pay | Admitting: Family Medicine

## 2017-10-09 NOTE — Telephone Encounter (Signed)
Questions answered.

## 2017-10-10 ENCOUNTER — Other Ambulatory Visit: Payer: Self-pay | Admitting: Family Medicine

## 2017-10-13 ENCOUNTER — Other Ambulatory Visit: Payer: Self-pay | Admitting: Family Medicine

## 2017-10-28 ENCOUNTER — Other Ambulatory Visit: Payer: Self-pay | Admitting: Family Medicine

## 2017-10-31 ENCOUNTER — Other Ambulatory Visit: Payer: Self-pay | Admitting: Family Medicine

## 2017-11-04 ENCOUNTER — Telehealth: Payer: Self-pay | Admitting: Family Medicine

## 2017-11-04 MED ORDER — LISINOPRIL-HYDROCHLOROTHIAZIDE 20-12.5 MG PO TABS
2.0000 | ORAL_TABLET | Freq: Every day | ORAL | 1 refills | Status: DC
Start: 2017-11-04 — End: 2017-11-27

## 2017-11-04 NOTE — Telephone Encounter (Signed)
Losartan-HCTZ 100-54m was changed to Lisinorpil-HCTZ 20-12.5 mg two tablets daily. Prescription sent to pharmacy. Will need follow up in 2-4 weeks to recheck BP

## 2017-11-04 NOTE — Telephone Encounter (Signed)
Patient's blood pressure medication, Losartan Hct 100/25, has been recalled and she is requesting something else be prescribed.  Please advise.

## 2017-11-04 NOTE — Telephone Encounter (Signed)
Patient aware, appointment made for follow up on 11/26/17 at 10:25 am

## 2017-11-06 ENCOUNTER — Other Ambulatory Visit: Payer: Self-pay | Admitting: Family Medicine

## 2017-11-06 DIAGNOSIS — R69 Illness, unspecified: Secondary | ICD-10-CM | POA: Diagnosis not present

## 2017-11-14 ENCOUNTER — Telehealth: Payer: Self-pay | Admitting: Family Medicine

## 2017-11-14 NOTE — Telephone Encounter (Signed)
Patient aware that she only needs to take the lisinopril 20/12.5 that was prescribed on 11/04/2017.

## 2017-11-24 DIAGNOSIS — K5090 Crohn's disease, unspecified, without complications: Secondary | ICD-10-CM | POA: Diagnosis not present

## 2017-11-24 DIAGNOSIS — E1140 Type 2 diabetes mellitus with diabetic neuropathy, unspecified: Secondary | ICD-10-CM | POA: Diagnosis not present

## 2017-11-24 DIAGNOSIS — E669 Obesity, unspecified: Secondary | ICD-10-CM | POA: Diagnosis not present

## 2017-11-24 DIAGNOSIS — Z794 Long term (current) use of insulin: Secondary | ICD-10-CM | POA: Diagnosis not present

## 2017-11-24 DIAGNOSIS — G8929 Other chronic pain: Secondary | ICD-10-CM | POA: Diagnosis not present

## 2017-11-24 DIAGNOSIS — E1165 Type 2 diabetes mellitus with hyperglycemia: Secondary | ICD-10-CM | POA: Diagnosis not present

## 2017-11-24 DIAGNOSIS — E1136 Type 2 diabetes mellitus with diabetic cataract: Secondary | ICD-10-CM | POA: Diagnosis not present

## 2017-11-24 DIAGNOSIS — E1159 Type 2 diabetes mellitus with other circulatory complications: Secondary | ICD-10-CM | POA: Diagnosis not present

## 2017-11-24 DIAGNOSIS — H9191 Unspecified hearing loss, right ear: Secondary | ICD-10-CM | POA: Diagnosis not present

## 2017-11-24 DIAGNOSIS — E785 Hyperlipidemia, unspecified: Secondary | ICD-10-CM | POA: Diagnosis not present

## 2017-11-27 ENCOUNTER — Encounter: Payer: Self-pay | Admitting: Family Medicine

## 2017-11-27 ENCOUNTER — Ambulatory Visit (INDEPENDENT_AMBULATORY_CARE_PROVIDER_SITE_OTHER): Payer: Medicare HMO | Admitting: Family Medicine

## 2017-11-27 VITALS — BP 138/84 | HR 77 | Temp 98.4°F | Ht 62.5 in | Wt 180.0 lb

## 2017-11-27 DIAGNOSIS — E1159 Type 2 diabetes mellitus with other circulatory complications: Secondary | ICD-10-CM | POA: Diagnosis not present

## 2017-11-27 DIAGNOSIS — I10 Essential (primary) hypertension: Secondary | ICD-10-CM

## 2017-11-27 DIAGNOSIS — I152 Hypertension secondary to endocrine disorders: Secondary | ICD-10-CM

## 2017-11-27 NOTE — Progress Notes (Signed)
BP 138/84   Pulse 77   Temp 98.4 F (36.9 C) (Oral)   Ht 5' 2.5" (1.588 m)   Wt 180 lb (81.6 kg)   BMI 32.40 kg/m    Subjective:    Patient ID: Felicia Pugh, female    DOB: 12-29-1945, 72 y.o.   MRN: [id number]  HPI: Felicia Pugh is a 72 y.o. female presenting on 11/27/2017 for Hypertension (1 mo; patient not taking Lisinopril Hct, taking Losartan 100 mg qd only; has Hctz 25 mg but is not taking; seems very confused about medicine)   HPI Hypertension Patient is currently on losartan 100, and their blood pressure today is 138/84. Patient denies any lightheadedness or dizziness. Patient denies headaches, blurred vision, chest pains, shortness of breath, or weakness. Denies any side effects from medication and is content with current medication.  She is not having cough and hoarseness that she was having on the lisinopril, she is also not taking hydrochlorothiazide currently  Relevant past medical, surgical, family and social history reviewed and updated as indicated. Interim medical history since our last visit reviewed. Allergies and medications reviewed and updated.  Review of Systems  Constitutional: Negative for chills and fever.  HENT: Negative for congestion, ear discharge and ear pain.   Eyes: Negative for visual disturbance.  Respiratory: Negative for chest tightness and shortness of breath.   Cardiovascular: Negative for chest pain and leg swelling.  Musculoskeletal: Negative for back pain and gait problem.  Skin: Negative for rash.  Neurological: Negative for dizziness, light-headedness and headaches.  Psychiatric/Behavioral: Negative for agitation and behavioral problems.  All other systems reviewed and are negative.   Per HPI unless specifically indicated above   Allergies as of 11/27/2017      Reactions   Metformin And Related    Severe diarrhea   Asa [aspirin]    GI Dr does not want her to take due to hx ulcers   Nsaids    GI Dr does not want her to take  due to hx of ulcers   Other Diarrhea   Steroids      Medication List        Accurate as of 11/27/17 11:00 AM. Always use your most recent med list.          atorvastatin 40 MG tablet Commonly known as:  LIPITOR TAKE ONE TABLET BY MOUTH ONCE DAILY   balsalazide 750 MG capsule Commonly known as:  COLAZAL TAKE THREE CAPSULES BY MOUTH THREE TIMES DAILY   dicyclomine 10 MG capsule Commonly known as:  BENTYL Take 1 capsule (10 mg total) by mouth 4 (four) times daily.   Insulin Syringes (Disposable) U-100 0.5 ML Misc Give insulin twice daily as directed   linagliptin 5 MG Tabs tablet Commonly known as:  TRADJENTA Take 1 tablet (5 mg total) by mouth daily.   NOVOLIN N [place] Inject 10-20 Units into the skin 2 (two) times daily. 20 UNITS IN THE MORNING & 15 UNITS AT NIGHT   insulin NPH Human 100 UNIT/ML injection Commonly known as:  NOVOLIN N RELION INJECT 26 UNITS IN THE MORNING AND 15 UNITS AT NIGHT   ONETOUCH VERIO test strip Generic drug:  glucose blood USE TO CHECK GLUCOSE TWICE DAILY   pantoprazole 40 MG tablet Commonly known as:  PROTONIX Take 1 tablet (40 mg total) by mouth daily before breakfast.   sitaGLIPtin 100 MG tablet Commonly known as:  JANUVIA Take 1 tablet (100 mg total) by mouth daily.  therapeutic multivitamin-minerals tablet Take 1 tablet by mouth daily.          Objective:    BP 138/84   Pulse 77   Temp 98.4 F (36.9 C) (Oral)   Ht 5' 2.5" (1.588 m)   Wt 180 lb (81.6 kg)   BMI 32.40 kg/m   Wt Readings from Last 3 Encounters:  11/27/17 180 lb (81.6 kg)  09/29/17 179 lb (81.2 kg)  06/26/17 180 lb (81.6 kg)    Physical Exam  Constitutional: She is oriented to person, place, and time. She appears well-developed and well-nourished. No distress.  Eyes: Conjunctivae are normal.  Cardiovascular: Normal rate, regular rhythm, normal heart sounds and intact distal pulses.  No murmur heard. Pulmonary/Chest: Effort normal and breath sounds  normal. No respiratory distress. She has no wheezes.  Musculoskeletal: Normal range of motion. She exhibits no edema.  Neurological: She is alert and oriented to person, place, and time. Coordination normal.  Skin: Skin is warm and dry. No rash noted. She is not diaphoretic.  Psychiatric: She has a normal mood and affect. Her behavior is normal.  Nursing note and vitals reviewed.       Assessment & Plan:   Problem List Items Addressed This Visit      Cardiovascular and Mediastinum   Hypertension associated with diabetes ([place]) - Primary   Relevant Orders   Microalbumin / creatinine urine ratio       Follow up plan: Return in about 6 weeks (around 01/08/2018), or if symptoms worsen or fail to improve, for Diabetes recheck.  Counseling provided for all of the vaccine components Orders Placed This Encounter  Procedures  . Microalbumin / creatinine urine ratio    Caryl Pina, MD [facility] 11/27/2017, 11:00 AM

## 2017-11-28 LAB — MICROALBUMIN / CREATININE URINE RATIO
Creatinine, Urine: 132.6 mg/dL
MICROALB/CREAT RATIO: 7.1 mg/g{creat} (ref 0.0–30.0)
MICROALBUM., U, RANDOM: 9.4 ug/mL

## 2017-12-04 ENCOUNTER — Other Ambulatory Visit: Payer: Self-pay | Admitting: Family Medicine

## 2017-12-22 ENCOUNTER — Ambulatory Visit (INDEPENDENT_AMBULATORY_CARE_PROVIDER_SITE_OTHER): Payer: Medicare HMO | Admitting: Gastroenterology

## 2017-12-22 ENCOUNTER — Encounter: Payer: Self-pay | Admitting: Gastroenterology

## 2017-12-22 VITALS — BP 150/85 | HR 77 | Temp 97.3°F | Ht 62.5 in | Wt 178.4 lb

## 2017-12-22 DIAGNOSIS — R748 Abnormal levels of other serum enzymes: Secondary | ICD-10-CM

## 2017-12-22 DIAGNOSIS — K5000 Crohn's disease of small intestine without complications: Secondary | ICD-10-CM

## 2017-12-22 NOTE — Progress Notes (Signed)
Primary Care Physician:  Dettinger, Fransisca Kaufmann, MD  Primary GI: Dr. Gala Romney   Chief Complaint  Patient presents with  . Crohn's Disease    f/u.   [name] Diarrhea    HPI:   Felicia Pugh is a 72 y.o. female presenting today with a history of Crohn's. Recent colonoscopy updated with focal active ileitis. Otherwise normal colon. Insurance previously did not approve Lialda but covered Colazal. Bentyl for IBS overlay.  Taking 1/2 dose of Imodium daily. No cramping. Yesterday had 4 BMs. Soft. No watery diarrhea. Doesn't feel like Bentyl makes much of a difference. Imodium half dose daily has helped with intermittent diarrhea. No rectal bleeding. Good appetite. No abdominal pain.   Isolated mildly elevated alk phos noted in Nov and May 2018. Mild fatty liver on prior imaging.    Past Medical History:  Diagnosis Date  . 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 Cataract   . Complication of anesthesia    difficulty breathing after waking up from anesthesia  . Crohn's disease ([place])   . Diabetes mellitus without complication ([place])   . Diabetic neuropathy ([place])   . Hyperlipidemia   . Hypertension   . IBS (irritable bowel syndrome)   . Stroke [facility]) 2011  . Syncope and collapse   . Thyroid nodule   . Ulcer     Past Surgical History:  Procedure Laterality Date  . ABDOMINAL HYSTERECTOMY     partial  . BACK SURGERY     x3 (Dr Loyola Mast, Dr Patrice Paradise)  . BREAST SURGERY Right 2002   cyst removed  . COLONOSCOPY  10/102006   RMR: normal  . COLONOSCOPY  09/06/2009   RMR: Suboptimal prep on the right side. Ulcers noted at cecum and in the descending colon. Terminal ileum could not be intubated. Ounces with chronic active colitis, question Crohn's  . COLONOSCOPY N/A 03/27/2015   Dr. Gala Romney: abnormal cecum and IC valve most consistent with IBD. TI intubated and appeared normal. Likely Crohn's disease. Patient denies NSAID use. No improvement with  Lialda samples at time of initial diagnosis  . COLONOSCOPY N/A 05/15/2017   Dr. Gala Romney: ab normal IC valve, s/p biopsy with active ileitis, diverticulosis in sigmoid and descending colon. Non-bleeding internal hemorrhoids  . ESOPHAGOGASTRODUODENOSCOPY    . LAPAROSCOPIC APPENDECTOMY N/A 07/21/2016   Procedure: APPENDECTOMY LAPAROSCOPIC;  Surgeon: Aviva Signs, MD;  Location: [facility];  Service: General;  Laterality: N/A;  . Small bowel capsule endoscopy  06/10/2005   RMR: single erosion versus AVM, distal ileum  . Small bowel capsule endoscopy  prior to 05/2005   [place]: reported erosions/ulcerations per medical record. actual report unavailable.    Current Outpatient Medications  Medication Sig Dispense Refill  . atorvastatin (LIPITOR) 40 MG tablet TAKE ONE TABLET BY MOUTH ONCE DAILY 90 tablet 0  . balsalazide (COLAZAL) 750 MG capsule TAKE 3 CAPSULES BY MOUTH THREE TIMES DAILY 270 capsule 0  . dicyclomine (BENTYL) 10 MG capsule Take 1 capsule (10 mg total) by mouth 4 (four) times daily. 120 capsule 3  . insulin NPH Human (NOVOLIN N RELION) 100 UNIT/ML injection INJECT 26 UNITS IN THE MORNING AND 15 UNITS AT NIGHT (Patient taking differently: INJECT 20 UNITS IN THE MORNING AND 10 UNITS AT NIGHT) 30 mL 3  . losartan-hydrochlorothiazide (HYZAAR) 100-25 MG tablet Take 1 tablet by mouth daily.    . pantoprazole (PROTONIX) 40 MG tablet Take 1 tablet (40  mg total) by mouth daily before breakfast. (Patient taking differently: Take 40 mg by mouth 2 (two) times daily. ) 90 tablet 3  . sitaGLIPtin (JANUVIA) 100 MG tablet Take 1 tablet (100 mg total) by mouth daily. 21 tablet 0  . therapeutic multivitamin-minerals (THERAGRAN-M) tablet Take 1 tablet by mouth daily.    . Insulin NPH Human, [place], (NOVOLIN N [place]) Inject 10-20 Units into the skin 2 (two) times daily. 20 UNITS IN THE MORNING & 15 UNITS AT NIGHT    . Insulin Syringes, Disposable, U-100 0.5 ML MISC Give insulin twice daily as directed (Patient not  taking: Reported on 12/22/2017) 100 each 1  . linagliptin (TRADJENTA) 5 MG TABS tablet Take 1 tablet (5 mg total) by mouth daily. (Patient not taking: Reported on 11/27/2017) 30 tablet 3  . ONETOUCH VERIO test strip USE TO CHECK GLUCOSE TWICE DAILY (Patient not taking: Reported on 12/22/2017) 200 each 3   No current facility-administered medications for this visit.     Allergies as of 12/22/2017 - Review Complete 12/22/2017  Allergen Reaction Noted  . Metformin and related  05/11/2014  . Asa [aspirin]  11/04/2012  . Nsaids  11/04/2012  . Other Diarrhea 12/25/2014    Family History  Problem Relation Age of Onset  . Hypertension Mother   . Stroke Mother   . Diabetes Father        w/ BKA  . Hypertension Father   . Asthma Father   . Cancer Sister   . Diabetes Sister        stomach  . Diabetes Brother   . Diabetes Sister   . Cancer Sister        breast  . Hypertension Sister   . Hypertension Daughter   . Hypertension Son   . Multiple sclerosis Son   . Colon cancer Neg Hx   . Inflammatory bowel disease Neg Hx     Social History   Socioeconomic History  . Marital status: Married    Spouse name: Not on file  . Number of children: 5  . Years of education: Not on file  . Highest education level: Not on file  Occupational History  . Not on file  Social Needs  . Financial resource strain: Not on file  . Food insecurity:    Worry: Not on file    Inability: Not on file  . Transportation needs:    Medical: Not on file    Non-medical: Not on file  Tobacco Use  . Smoking status: Never Smoker  . Smokeless tobacco: Never Used  Substance and Sexual Activity  . Alcohol use: No  . Drug use: No  . Sexual activity: Never  Lifestyle  . Physical activity:    Days per week: Not on file    Minutes per session: Not on file  . Stress: Not on file  Relationships  . Social connections:    Talks on phone: Not on file    Gets together: Not on file    Attends religious service: Not  on file    Active member of club or organization: Not on file    Attends meetings of clubs or organizations: Not on file    Relationship status: Not on file  Other Topics Concern  . Not on file  Social History Narrative  . Not on file    Review of Systems: Gen: Denies fever, chills, anorexia. Denies fatigue, weakness, weight loss.  CV: Denies chest pain, palpitations, syncope, peripheral edema, and claudication.  Resp: Denies dyspnea at rest, cough, wheezing, coughing up blood, and pleurisy. GI: see HPI  Derm: Denies rash, itching, dry skin Psych: Denies depression, anxiety, memory loss, confusion. No homicidal or suicidal ideation.  Heme: Denies bruising, bleeding, and enlarged lymph nodes.  Physical Exam: BP (!) 150/85   Pulse 77   Temp (!) 97.3 F (36.3 C) (Oral)   Ht 5' 2.5" (1.588 m)   Wt 178 lb 6.4 oz (80.9 kg)   BMI 32.11 kg/m  General:   Alert and oriented. No distress noted. Pleasant and cooperative.  Head:  Normocephalic and atraumatic. Eyes:  Conjuctiva clear without scleral icterus. Mouth:  Oral mucosa pink and moist. Good dentition. No lesions. Abdomen:  +BS, soft, non-tender and non-distended. No rebound or guarding. No HSM or masses noted. Msk:  Symmetrical without gross deformities. Normal posture. Extremities:  Without edema. Neurologic:  Alert and  oriented x4 Psych:  Alert and cooperative. Normal mood and affect.

## 2017-12-22 NOTE — Patient Instructions (Signed)
I would stop the dicyclomine (Bentyl) for now. Let me know if you need this again.  We will see what your labs show when you get it checked by your PCP. If you don't have it soon, we will need to order additional labs.   I will see you in 6 months!  It was a pleasure to see you today. I strive to create trusting relationships with patients to provide genuine, compassionate, and quality care. I value your feedback. If you receive a survey regarding your visit,  I greatly appreciate you taking time to fill this out.   Annitta Needs, PhD, ANP-BC [facility]

## 2017-12-28 NOTE — Assessment & Plan Note (Signed)
72 year old female with history of Crohn's disease, recent colonoscopy with focal active ileitis. Doing well with Colazal (insurance did not cover Lialda). Need updated BUN/Cr. Taking imodium 1/2 dose daily and doesn't feel like Bentyl is helpful. Will stop Bentyl, take imodium prn. Return in 6 months. Will follow-up on labs that PCP will be ordering in new future. If no CBC, CMP completed, we will need to update this.

## 2017-12-28 NOTE — Assessment & Plan Note (Signed)
Mild isolated elevation in May and November 2018. Will follow-up on upcoming labs. If remains elevated, will order additional serologies and ultrasound. Most likely benign but needs to be followed.

## 2017-12-29 ENCOUNTER — Encounter: Payer: Self-pay | Admitting: Family Medicine

## 2017-12-29 ENCOUNTER — Ambulatory Visit (INDEPENDENT_AMBULATORY_CARE_PROVIDER_SITE_OTHER): Payer: Medicare HMO | Admitting: Family Medicine

## 2017-12-29 ENCOUNTER — Ambulatory Visit: Payer: Medicare HMO | Admitting: Family Medicine

## 2017-12-29 VITALS — BP 134/84 | HR 75 | Temp 96.5°F | Ht 62.5 in | Wt 177.0 lb

## 2017-12-29 DIAGNOSIS — E785 Hyperlipidemia, unspecified: Secondary | ICD-10-CM | POA: Diagnosis not present

## 2017-12-29 DIAGNOSIS — E1159 Type 2 diabetes mellitus with other circulatory complications: Secondary | ICD-10-CM

## 2017-12-29 DIAGNOSIS — K219 Gastro-esophageal reflux disease without esophagitis: Secondary | ICD-10-CM

## 2017-12-29 DIAGNOSIS — E119 Type 2 diabetes mellitus without complications: Secondary | ICD-10-CM | POA: Diagnosis not present

## 2017-12-29 DIAGNOSIS — E1169 Type 2 diabetes mellitus with other specified complication: Secondary | ICD-10-CM | POA: Diagnosis not present

## 2017-12-29 DIAGNOSIS — I10 Essential (primary) hypertension: Secondary | ICD-10-CM

## 2017-12-29 DIAGNOSIS — I152 Hypertension secondary to endocrine disorders: Secondary | ICD-10-CM

## 2017-12-29 LAB — BAYER DCA HB A1C WAIVED: HB A1C (BAYER DCA - WAIVED): 7 % — ABNORMAL HIGH (ref <7.0)

## 2017-12-29 NOTE — Progress Notes (Signed)
CC'ED TO PCP 

## 2017-12-29 NOTE — Progress Notes (Signed)
BP 134/84   Pulse 75   Temp (!) 96.5 F (35.8 C) (Oral)   Ht 5' 2.5" (1.588 m)   Wt 177 lb (80.3 kg)   BMI 31.86 kg/m    Subjective:    Patient ID: Felicia Pugh, female    DOB: 1946/04/24, 72 y.o.   MRN: [id number]  HPI: MISTEE SOLIMAN is a 72 y.o. female presenting on 12/29/2017 for Diabetes (has not started Tradjenta yet, waiting to finish up her supply of Januvia in 3 more days)   HPI Type 2 diabetes mellitus Patient comes in today for recheck of his diabetes. Patient has been currently taking Januvia and NPH. Patient is currently on an ACE inhibitor/ARB. Patient has not seen an ophthalmologist this year. Patient denies any issues with their feet.   Hypertension Patient is currently on losartan-hydrochlorthiazide, and their blood pressure today is 134/84. Patient denies any lightheadedness or dizziness. Patient denies headaches, blurred vision, chest pains, shortness of breath, or weakness. Denies any side effects from medication and is content with current medication.   Hyperlipidemia Patient is coming in for recheck of his hyperlipidemia. The patient is currently taking atorvastatin. They deny any issues with myalgias or history of liver damage from it. They deny any focal numbness or weakness or chest pain.   GERD Patient is currently on Protonix.  She denies any major symptoms or abdominal pain or belching or burping. She denies any blood in her stool or lightheadedness or dizziness.   Relevant past medical, surgical, family and social history reviewed and updated as indicated. Interim medical history since our last visit reviewed. Allergies and medications reviewed and updated.  Review of Systems  Constitutional: Negative for chills and fever.  HENT: Negative for congestion, ear discharge and ear pain.   Eyes: Negative for redness and visual disturbance.  Respiratory: Negative for chest tightness and shortness of breath.   Cardiovascular: Negative for chest pain and  leg swelling.  Musculoskeletal: Negative for back pain and gait problem.  Skin: Negative for rash.  Neurological: Negative for dizziness, light-headedness and headaches.  Psychiatric/Behavioral: Negative for agitation and behavioral problems.  All other systems reviewed and are negative.   Per HPI unless specifically indicated above   Allergies as of 12/29/2017      Reactions   Metformin And Related    Severe diarrhea   Asa [aspirin]    GI Dr does not want her to take due to hx ulcers   Nsaids    GI Dr does not want her to take due to hx of ulcers   Other Diarrhea   Steroids      Medication List        Accurate as of 12/29/17 10:46 AM. Always use your most recent med list.          atorvastatin 40 MG tablet Commonly known as:  LIPITOR TAKE ONE TABLET BY MOUTH ONCE DAILY   balsalazide 750 MG capsule Commonly known as:  COLAZAL TAKE 3 CAPSULES BY MOUTH THREE TIMES DAILY   dicyclomine 10 MG capsule Commonly known as:  BENTYL Take 1 capsule (10 mg total) by mouth 4 (four) times daily.   insulin NPH Human 100 UNIT/ML injection Commonly known as:  NOVOLIN N RELION INJECT 26 UNITS IN THE MORNING AND 15 UNITS AT NIGHT   linagliptin 5 MG Tabs tablet Commonly known as:  TRADJENTA Take 1 tablet (5 mg total) by mouth daily.   losartan-hydrochlorothiazide 100-25 MG tablet Commonly known as:  HYZAAR  Take 1 tablet by mouth daily.   sitaGLIPtin 100 MG tablet Commonly known as:  JANUVIA Take 1 tablet (100 mg total) by mouth daily.   therapeutic multivitamin-minerals tablet Take 1 tablet by mouth daily.          Objective:    BP 134/84   Pulse 75   Temp (!) 96.5 F (35.8 C) (Oral)   Ht 5' 2.5" (1.588 m)   Wt 177 lb (80.3 kg)   BMI 31.86 kg/m   Wt Readings from Last 3 Encounters:  12/29/17 177 lb (80.3 kg)  12/22/17 178 lb 6.4 oz (80.9 kg)  11/27/17 180 lb (81.6 kg)    Physical Exam  Constitutional: She is oriented to person, place, and time. She appears  well-developed and well-nourished. No distress.  Eyes: Conjunctivae are normal.  Cardiovascular: Normal rate, regular rhythm, normal heart sounds and intact distal pulses.  No murmur heard. Pulmonary/Chest: Effort normal and breath sounds normal. No respiratory distress. She has no wheezes.  Musculoskeletal: Normal range of motion. She exhibits no edema or tenderness.  Neurological: She is alert and oriented to person, place, and time. Coordination normal.  Skin: Skin is warm and dry. No rash noted. She is not diaphoretic.  Psychiatric: She has a normal mood and affect. Her behavior is normal.  Nursing note and vitals reviewed.   Diabetic Foot Exam - Simple   Simple Foot Form Diabetic Foot exam was performed with the following findings:  Yes 12/29/2017 11:04 AM  Visual Inspection No deformities, no ulcerations, no other skin breakdown bilaterally:  Yes Sensation Testing Intact to touch and monofilament testing bilaterally:  Yes Pulse Check Posterior Tibialis and Dorsalis pulse intact bilaterally:  Yes Comments         Assessment & Plan:   Problem List Items Addressed This Visit      Cardiovascular and Mediastinum   Hypertension associated with diabetes (HCC)   Relevant Orders   CMP14+EGFR (Completed)     Digestive   GERD (gastroesophageal reflux disease)   Relevant Orders   CBC with Differential/Platelet (Completed)     Endocrine   Hyperlipidemia associated with type 2 diabetes mellitus (HCC)   Relevant Orders   Lipid panel (Completed)   Type 2 diabetes mellitus without complication, without long-term current use of insulin (HCC) - Primary   Relevant Orders   CMP14+EGFR (Completed)   Bayer DCA Hb A1c Waived (Completed)      Patient is on a statin already, blood pressure controlled, will recheck her A1c and diabetes today.  Patient said she had exam in February of this year at my eye doctor in [place] and we will call over to request those records.  Follow up  plan: Return in about 3 months (around 03/31/2018), or if symptoms worsen or fail to improve, for Follow-up diabetes and hypertension and cholesterol.  Counseling provided for all of the vaccine components Orders Placed This Encounter  Procedures  . CBC with Differential/Platelet  . CMP14+EGFR  . Lipid panel  . Bayer DCA Hb A1c Waived     , MD [facility] 12/29/2017, 10:46 AM     

## 2017-12-30 LAB — CMP14+EGFR
ALBUMIN: 4.3 g/dL (ref 3.5–4.8)
ALT: 12 IU/L (ref 0–32)
AST: 18 IU/L (ref 0–40)
Albumin/Globulin Ratio: 1.5 (ref 1.2–2.2)
Alkaline Phosphatase: 129 IU/L — ABNORMAL HIGH (ref 39–117)
BUN / CREAT RATIO: 22 (ref 12–28)
BUN: 15 mg/dL (ref 8–27)
Bilirubin Total: 0.4 mg/dL (ref 0.0–1.2)
CALCIUM: 9.5 mg/dL (ref 8.7–10.3)
CHLORIDE: 105 mmol/L (ref 96–106)
CO2: 24 mmol/L (ref 20–29)
CREATININE: 0.69 mg/dL (ref 0.57–1.00)
GFR, EST AFRICAN AMERICAN: 101 mL/min/{1.73_m2} (ref >59)
GFR, EST NON AFRICAN AMERICAN: 88 mL/min/{1.73_m2} (ref >59)
GLUCOSE: 92 mg/dL (ref 65–99)
Globulin, Total: 2.9 g/dL (ref 1.5–4.5)
Potassium: 4.2 mmol/L (ref 3.5–5.2)
Sodium: 146 mmol/L — ABNORMAL HIGH (ref 134–144)
TOTAL PROTEIN: 7.2 g/dL (ref 6.0–8.5)

## 2017-12-30 LAB — LIPID PANEL
CHOLESTEROL TOTAL: 156 mg/dL (ref 100–199)
Chol/HDL Ratio: 2.1 ratio (ref 0.0–4.4)
HDL: 76 mg/dL (ref >39)
LDL CALC: 69 mg/dL (ref 0–99)
TRIGLYCERIDES: 55 mg/dL (ref 0–149)
VLDL CHOLESTEROL CAL: 11 mg/dL (ref 5–40)

## 2017-12-30 LAB — CBC WITH DIFFERENTIAL/PLATELET
BASOS ABS: 0 10*3/uL (ref 0.0–0.2)
BASOS: 1 %
EOS (ABSOLUTE): 0.1 10*3/uL (ref 0.0–0.4)
Eos: 2 %
HEMOGLOBIN: 11.2 g/dL (ref 11.1–15.9)
Hematocrit: 36 % (ref 34.0–46.6)
IMMATURE GRANS (ABS): 0 10*3/uL (ref 0.0–0.1)
IMMATURE GRANULOCYTES: 0 %
LYMPHS: 24 %
Lymphocytes Absolute: 2 10*3/uL (ref 0.7–3.1)
MCH: 28.1 pg (ref 26.6–33.0)
MCHC: 31.1 g/dL — ABNORMAL LOW (ref 31.5–35.7)
MCV: 90 fL (ref 79–97)
Monocytes Absolute: 0.6 10*3/uL (ref 0.1–0.9)
Monocytes: 8 %
NEUTROS ABS: 5.3 10*3/uL (ref 1.4–7.0)
NEUTROS PCT: 65 %
PLATELETS: 337 10*3/uL (ref 150–450)
RBC: 3.99 x10E6/uL (ref 3.77–5.28)
RDW: 14 % (ref 12.3–15.4)
WBC: 8.1 10*3/uL (ref 3.4–10.8)

## 2018-01-18 ENCOUNTER — Other Ambulatory Visit: Payer: Self-pay | Admitting: Family Medicine

## 2018-03-09 DIAGNOSIS — Z1231 Encounter for screening mammogram for malignant neoplasm of breast: Secondary | ICD-10-CM | POA: Diagnosis not present

## 2018-03-09 LAB — HM MAMMOGRAPHY

## 2018-03-16 NOTE — Progress Notes (Unsigned)
In Care Everywhere Cat 1

## 2018-03-18 ENCOUNTER — Other Ambulatory Visit: Payer: Self-pay | Admitting: *Deleted

## 2018-03-18 DIAGNOSIS — E119 Type 2 diabetes mellitus without complications: Secondary | ICD-10-CM

## 2018-03-23 ENCOUNTER — Other Ambulatory Visit: Payer: Self-pay

## 2018-03-23 DIAGNOSIS — E119 Type 2 diabetes mellitus without complications: Secondary | ICD-10-CM

## 2018-03-23 MED ORDER — LINAGLIPTIN 5 MG PO TABS: 5.0000 mg | ORAL_TABLET | Freq: Every day | ORAL | 0 refills | Status: DC

## 2018-03-26 ENCOUNTER — Telehealth: Payer: Self-pay | Admitting: Gastroenterology

## 2018-03-26 NOTE — Telephone Encounter (Signed)
Please let patient know I had already reviewed CBC, CMP from PCP.  Lab Results  Component Value Date   WBC 8.1 12/29/2017   HGB 11.2 12/29/2017   HCT 36.0 12/29/2017   MCV 90 12/29/2017   PLT 337 12/29/2017   Lab Results  Component Value Date   ALT 12 12/29/2017   AST 18 12/29/2017   ALKPHOS 129 (H) 12/29/2017   BILITOT 0.4 12/29/2017     Alk Phos is still mildly elevated but improved. I suggest we check a repeat HFP now with GGT.

## 2018-03-30 ENCOUNTER — Other Ambulatory Visit: Payer: Self-pay

## 2018-03-30 ENCOUNTER — Telehealth: Payer: Self-pay | Admitting: Internal Medicine

## 2018-03-30 DIAGNOSIS — K50919 Crohn's disease, unspecified, with unspecified complications: Secondary | ICD-10-CM | POA: Diagnosis not present

## 2018-03-30 NOTE — Telephone Encounter (Signed)
PATIENT WENT TO LAB Friday TO HAVE LABS TAKEN FOR [place] AND THE ORDER WAS NOT THERE.  WILL YOU PLEASE CALL HER AND LET HER KNOW WHEN THE ORDER IS SENT TO THE LAB SO SHE WILL KNOW WHEN TO GO

## 2018-03-30 NOTE — Telephone Encounter (Signed)
Orders placed and released. Pt is aware.

## 2018-03-31 ENCOUNTER — Ambulatory Visit: Payer: Medicare HMO | Admitting: Family Medicine

## 2018-03-31 LAB — GAMMA GT: GGT: 16 U/L (ref 3–65)

## 2018-03-31 LAB — HEPATIC FUNCTION PANEL
AG RATIO: 1.6 (calc) (ref 1.0–2.5)
ALKALINE PHOSPHATASE (APISO): 115 U/L (ref 33–130)
ALT: 10 U/L (ref 6–29)
AST: 16 U/L (ref 10–35)
Albumin: 4.1 g/dL (ref 3.6–5.1)
BILIRUBIN INDIRECT: 0.4 mg/dL (ref 0.2–1.2)
Bilirubin, Direct: 0.1 mg/dL (ref 0.0–0.2)
Globulin: 2.6 g/dL (calc) (ref 1.9–3.7)
TOTAL PROTEIN: 6.7 g/dL (ref 6.1–8.1)
Total Bilirubin: 0.5 mg/dL (ref 0.2–1.2)

## 2018-03-31 NOTE — Progress Notes (Signed)
Felicia Pugh: your liver numbers are now completely normal! Likely that mild elevation was not liver related. We can continue to follow this. Sent in [place].

## 2018-04-09 ENCOUNTER — Encounter: Payer: Self-pay | Admitting: Family Medicine

## 2018-04-09 ENCOUNTER — Ambulatory Visit (INDEPENDENT_AMBULATORY_CARE_PROVIDER_SITE_OTHER): Payer: Medicare HMO | Admitting: Family Medicine

## 2018-04-09 VITALS — BP 147/81 | HR 76 | Temp 97.0°F | Ht 62.5 in | Wt 177.6 lb

## 2018-04-09 DIAGNOSIS — I10 Essential (primary) hypertension: Secondary | ICD-10-CM

## 2018-04-09 DIAGNOSIS — E1159 Type 2 diabetes mellitus with other circulatory complications: Secondary | ICD-10-CM

## 2018-04-09 DIAGNOSIS — I152 Hypertension secondary to endocrine disorders: Secondary | ICD-10-CM

## 2018-04-09 DIAGNOSIS — E669 Obesity, unspecified: Secondary | ICD-10-CM

## 2018-04-09 DIAGNOSIS — E1169 Type 2 diabetes mellitus with other specified complication: Secondary | ICD-10-CM | POA: Diagnosis not present

## 2018-04-09 DIAGNOSIS — E785 Hyperlipidemia, unspecified: Secondary | ICD-10-CM | POA: Diagnosis not present

## 2018-04-09 LAB — BAYER DCA HB A1C WAIVED: HB A1C (BAYER DCA - WAIVED): 7.8 % — ABNORMAL HIGH (ref <7.0)

## 2018-04-09 MED ORDER — BALSALAZIDE DISODIUM 750 MG PO CAPS: 2250.0000 mg | ORAL_CAPSULE | Freq: Three times a day (TID) | ORAL | 2 refills | Status: DC

## 2018-04-09 MED ORDER — LOSARTAN POTASSIUM-HCTZ 100-25 MG PO TABS: 1.0000 | ORAL_TABLET | Freq: Every day | ORAL | 3 refills | Status: DC

## 2018-04-09 MED ORDER — ATORVASTATIN CALCIUM 40 MG PO TABS: 40.0000 mg | ORAL_TABLET | Freq: Every day | ORAL | 3 refills | Status: DC

## 2018-04-09 MED ORDER — INSULIN NPH (HUMAN) (ISOPHANE) 100 UNIT/ML ~~LOC~~ SUSP: SUBCUTANEOUS | 4 refills | Status: DC

## 2018-04-09 MED ORDER — LINAGLIPTIN 5 MG PO TABS: 5.0000 mg | ORAL_TABLET | Freq: Every day | ORAL | 3 refills | Status: DC

## 2018-04-09 NOTE — Progress Notes (Signed)
BP (!) 147/81   Pulse 76   Temp (!) 97 F (36.1 C) (Oral)   Ht 5' 2.5" (1.588 m)   Wt 177 lb 9.6 oz (80.6 kg)   BMI 31.97 kg/m    Subjective:    Patient ID: Felicia Pugh, female    DOB: 10-Feb-1946, 72 y.o.   MRN: [id number]  HPI: Felicia Pugh is a 72 y.o. female presenting on 04/09/2018 for Diabetes (3 month follow up. Patient states that he has a knot that has been on her hip for awhile and sore.); Hypertension; and Hyperlipidemia   HPI Type 2 diabetes mellitus Patient comes in today for recheck of his diabetes. Patient has been currently taking Tradjenta and insulin NPH, 20 a.m. and 10 PM. Patient is currently on an ACE inhibitor/ARB. Patient has seen an ophthalmologist this year. Patient denies any issues with their feet.   Hyperlipidemia Patient is coming in for recheck of his hyperlipidemia. The patient is currently taking atorvastatin. They deny any issues with myalgias or history of liver damage from it. They deny any focal numbness or weakness or chest pain.   Hypertension Patient is currently on losartan-hydrochlorothiazide, and their blood pressure today is 147/81, we will have her keep some numbers at home and bring them back next time we may adjust medication next time.. Patient denies any lightheadedness or dizziness. Patient denies headaches, blurred vision, chest pains, shortness of breath, or weakness. Denies any side effects from medication and is content with current medication.   Relevant past medical, surgical, family and social history reviewed and updated as indicated. Interim medical history since our last visit reviewed. Allergies and medications reviewed and updated.  Review of Systems  Constitutional: Negative for chills and fever.  HENT: Negative for congestion, ear discharge and ear pain.   Eyes: Negative for redness and visual disturbance.  Respiratory: Negative for chest tightness and shortness of breath.   Cardiovascular: Negative for chest pain  and leg swelling.  Genitourinary: Negative for difficulty urinating and dysuria.  Musculoskeletal: Negative for back pain and gait problem.  Skin: Negative for rash.  Neurological: Negative for light-headedness and headaches.  Psychiatric/Behavioral: Negative for agitation and behavioral problems.  All other systems reviewed and are negative.   Per HPI unless specifically indicated above   Allergies as of 04/09/2018      Reactions   Metformin And Related    Severe diarrhea   Asa [aspirin]    GI Dr does not want her to take due to hx ulcers   Nsaids    GI Dr does not want her to take due to hx of ulcers   Other Diarrhea   Steroids      Medication List        Accurate as of 04/09/18 11:36 AM. Always use your most recent med list.          atorvastatin 40 MG tablet Commonly known as:  LIPITOR Take 1 tablet (40 mg total) by mouth daily.   balsalazide 750 MG capsule Commonly known as:  COLAZAL Take 3 capsules (2,250 mg total) by mouth 3 (three) times daily.   insulin NPH Human 100 UNIT/ML injection Commonly known as:  HUMULIN N,NOVOLIN N INJECT 26 UNITS IN THE MORNING AND 15 UNITS AT NIGHT   linagliptin 5 MG Tabs tablet Commonly known as:  TRADJENTA Take 1 tablet (5 mg total) by mouth daily.   losartan-hydrochlorothiazide 100-25 MG tablet Commonly known as:  HYZAAR Take 1 tablet by mouth daily.  therapeutic multivitamin-minerals tablet Take 1 tablet by mouth daily.          Objective:    BP (!) 147/81   Pulse 76   Temp (!) 97 F (36.1 C) (Oral)   Ht 5' 2.5" (1.588 m)   Wt 177 lb 9.6 oz (80.6 kg)   BMI 31.97 kg/m   Wt Readings from Last 3 Encounters:  04/09/18 177 lb 9.6 oz (80.6 kg)  12/29/17 177 lb (80.3 kg)  12/22/17 178 lb 6.4 oz (80.9 kg)    Physical Exam  Constitutional: She is oriented to person, place, and time. She appears well-developed and well-nourished. No distress.  Eyes: Conjunctivae are normal.  Neck: Neck supple. No thyromegaly  present.  Cardiovascular: Normal rate, regular rhythm, normal heart sounds and intact distal pulses.  No murmur heard. Pulmonary/Chest: Effort normal and breath sounds normal. No respiratory distress. She has no wheezes.  Musculoskeletal: Normal range of motion. She exhibits edema (trace).  Lymphadenopathy:    She has no cervical adenopathy.  Neurological: She is alert and oriented to person, place, and time. Coordination normal.  Skin: Skin is warm and dry. No rash noted. She is not diaphoretic.  Psychiatric: She has a normal mood and affect. Her behavior is normal.  Nursing note and vitals reviewed.       Assessment & Plan:   Problem List Items Addressed This Visit      Cardiovascular and Mediastinum   Hypertension associated with diabetes ([place])   Relevant Medications   linagliptin (TRADJENTA) 5 MG TABS tablet   atorvastatin (LIPITOR) 40 MG tablet   losartan-hydrochlorothiazide (HYZAAR) 100-25 MG tablet   insulin NPH Human (NOVOLIN N RELION) 100 UNIT/ML injection     Endocrine   Hyperlipidemia associated with type 2 diabetes mellitus (HCC)   Relevant Medications   linagliptin (TRADJENTA) 5 MG TABS tablet   atorvastatin (LIPITOR) 40 MG tablet   losartan-hydrochlorothiazide (HYZAAR) 100-25 MG tablet   insulin NPH Human (NOVOLIN N RELION) 100 UNIT/ML injection   Type 2 diabetes mellitus with other specified complication (HCC) - Primary   Relevant Medications   linagliptin (TRADJENTA) 5 MG TABS tablet   atorvastatin (LIPITOR) 40 MG tablet   losartan-hydrochlorothiazide (HYZAAR) 100-25 MG tablet   insulin NPH Human (NOVOLIN N RELION) 100 UNIT/ML injection   Other Relevant Orders   Bayer DCA Hb A1c Waived (Completed)     Other   Obesity (BMI 30-39.9)   Relevant Medications   linagliptin (TRADJENTA) 5 MG TABS tablet   insulin NPH Human (NOVOLIN N RELION) 100 UNIT/ML injection     Continue current medication, may add or increase if elevated but a1c was good last time.     Follow up plan: Return in about 3 months (around 07/10/2018), or if symptoms worsen or fail to improve, for Diabetes and hypertension and cholesterol recheck.  Counseling provided for all of the vaccine components Orders Placed This Encounter  Procedures  . Bayer [facility] Hb A1c [place], MD [facility] 04/09/2018, 11:36 AM

## 2018-04-24 DIAGNOSIS — R42 Dizziness and giddiness: Secondary | ICD-10-CM | POA: Diagnosis not present

## 2018-04-24 DIAGNOSIS — R110 Nausea: Secondary | ICD-10-CM | POA: Diagnosis not present

## 2018-04-30 ENCOUNTER — Telehealth: Payer: Self-pay | Admitting: Family Medicine

## 2018-05-05 ENCOUNTER — Ambulatory Visit: Payer: Medicare HMO | Admitting: *Deleted

## 2018-05-06 DIAGNOSIS — R69 Illness, unspecified: Secondary | ICD-10-CM | POA: Diagnosis not present

## 2018-05-19 ENCOUNTER — Ambulatory Visit (INDEPENDENT_AMBULATORY_CARE_PROVIDER_SITE_OTHER): Payer: Medicare HMO | Admitting: *Deleted

## 2018-05-19 ENCOUNTER — Encounter: Payer: Self-pay | Admitting: *Deleted

## 2018-05-19 VITALS — BP 108/64 | HR 78 | Ht 62.5 in | Wt 176.0 lb

## 2018-05-19 DIAGNOSIS — Z0000 Encounter for general adult medical examination without abnormal findings: Secondary | ICD-10-CM | POA: Diagnosis not present

## 2018-05-19 DIAGNOSIS — Z23 Encounter for immunization: Secondary | ICD-10-CM | POA: Diagnosis not present

## 2018-05-19 MED ORDER — "INSULIN SYRINGE-NEEDLE U-100 31G X 1/4"" 1 ML MISC": 1.0000 | Freq: Two times a day (BID) | 6 refills | Status: DC

## 2018-05-19 MED ORDER — "INSULIN SYRINGE 29G X 1/2"" 1 ML MISC": 1.0000 | Freq: Two times a day (BID) | 6 refills | Status: DC

## 2018-05-19 NOTE — Progress Notes (Signed)
Subjective:   Felicia Pugh is a 72 y.o. female who presents for Medicare Annual (Subsequent) preventive examination.  Mrs. Wermuth retired from [employer] in 2011 on disability due back problems.  She enjoys being active in her church, spending time with her family, and cooking.  She lives with her husband.  They have 5 children, 7 grandchildren, and 6 great grandchildren.  She feels her health is improved this year from last year because she feels better in general.  She reports no hospitalizations, ER visits, or surgeries in the past year.   Review of Systems:   Musculoskeletal - back pain All other systems negative       Objective:     Vitals: BP 108/64   Pulse 78   Ht 5' 2.5" (1.588 m)   Wt 176 lb (79.8 kg)   BMI 31.68 kg/m   Body mass index is 31.68 kg/m.  Advanced Directives 05/19/2018 05/15/2017 05/01/2017 03/17/2017 07/21/2016 07/20/2016 03/27/2015  Does Patient Have a Medical Advance Directive? Yes No No No No No No  Type of Advance Directive [employer]  Does patient want to make changes to medical advance directive? Yes (MAU/Ambulatory/Procedural Areas - Information given) - - - - - -  Copy of [place] in Chart? No - copy requested - - - - - -  Would patient like information on creating a medical advance directive? - No - Patient declined Yes (MAU/Ambulatory/Procedural Areas - Information given) No - Patient declined No - Patient declined - No - patient declined information    Tobacco Social History   Tobacco Use  Smoking Status Never Smoker  Smokeless Tobacco Never Used     Counseling given: No   Clinical Intake:     Pain Score: 7                  Past Medical History:  Diagnosis Date  . 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 Cataract   . Complication of anesthesia    difficulty breathing after waking up from anesthesia  . Crohn's disease ([place])     . Diabetes mellitus without complication ([place])   . Diabetic neuropathy ([place])   . Hyperlipidemia   . Hypertension   . IBS (irritable bowel syndrome)   . Stroke [facility]) 2011  . Syncope and collapse   . Thyroid nodule   . Ulcer    Past Surgical History:  Procedure Laterality Date  . ABDOMINAL HYSTERECTOMY     partial  . BACK SURGERY     x3 (Dr Loyola Mast, Dr Patrice Paradise)  . BREAST SURGERY Right 2002   cyst removed  . COLONOSCOPY  10/102006   RMR: normal  . COLONOSCOPY  09/06/2009   RMR: Suboptimal prep on the right side. Ulcers noted at cecum and in the descending colon. Terminal ileum could not be intubated. Ounces with chronic active colitis, question Crohn's  . COLONOSCOPY N/A 03/27/2015   Dr. Gala Romney: abnormal cecum and IC valve most consistent with IBD. TI intubated and appeared normal. Likely Crohn's disease. Patient denies NSAID use. No improvement with Lialda samples at time of initial diagnosis  . COLONOSCOPY N/A 05/15/2017   Dr. Gala Romney: ab normal IC valve, s/p biopsy with active ileitis, diverticulosis in sigmoid and descending colon. Non-bleeding internal hemorrhoids  . ESOPHAGOGASTRODUODENOSCOPY    . LAPAROSCOPIC APPENDECTOMY N/A 07/21/2016   Procedure: APPENDECTOMY LAPAROSCOPIC;  Surgeon: Aviva Signs, MD;  Location: [facility];  Service: General;  Laterality: N/A;  . Small bowel capsule endoscopy  06/10/2005   RMR: single erosion versus AVM, distal ileum  . Small bowel capsule endoscopy  prior to 05/2005   [place]: reported erosions/ulcerations per medical record. actual report unavailable.   Family History  Problem Relation Age of Onset  . Hypertension Mother   . Stroke Mother   . Diabetes Father        w/ BKA  . Hypertension Father   . Asthma Father   . Cancer Sister   . Diabetes Sister        stomach  . Diabetes Brother   . Diabetes Sister   . Cancer Sister        breast  . Hypertension Sister   . Hypertension Daughter   . Hypertension Son   . Multiple sclerosis Son   .  Colon cancer Neg Hx   . Inflammatory bowel disease Neg Hx    Social History   Socioeconomic History  . Marital status: Married    Spouse name: Not on file  . Number of children: 5  . Years of education: Not on file  . Highest education level: Not on file  Occupational History  . Not on file  Social Needs  . Financial resource strain: Not on file  . Food insecurity:    Worry: Not on file    Inability: Not on file  . Transportation needs:    Medical: Not on file    Non-medical: Not on file  Tobacco Use  . Smoking status: Never Smoker  . Smokeless tobacco: Never Used  Substance and Sexual Activity  . Alcohol use: No  . Drug use: No  . Sexual activity: Never  Lifestyle  . Physical activity:    Days per week: Not on file    Minutes per session: Not on file  . Stress: Not on file  Relationships  . Social connections:    Talks on phone: Not on file    Gets together: Not on file    Attends religious service: Not on file    Active member of club or organization: Not on file    Attends meetings of clubs or organizations: Not on file    Relationship status: Not on file  Other Topics Concern  . Not on file  Social History Narrative  . Not on file    Outpatient Encounter Medications as of 05/19/2018  Medication Sig  . atorvastatin (LIPITOR) 40 MG tablet Take 1 tablet (40 mg total) by mouth daily.  . balsalazide (COLAZAL) 750 MG capsule Take 3 capsules (2,250 mg total) by mouth 3 (three) times daily.  . ferrous sulfate (IRON SUPPLEMENT) 325 (65 FE) MG tablet Take 325 mg by mouth daily.  . insulin NPH Human (NOVOLIN N RELION) 100 UNIT/ML injection INJECT 26 UNITS IN THE MORNING AND 15 UNITS AT NIGHT  . linagliptin (TRADJENTA) 5 MG TABS tablet Take 1 tablet (5 mg total) by mouth daily.  [name] losartan-hydrochlorothiazide (HYZAAR) 100-25 MG tablet Take 1 tablet by mouth daily.  Glory Rosebush VERIO test strip USE 1 STRIP TO CHECK GLUCOSE TWICE DAILY  . therapeutic multivitamin-minerals  (THERAGRAN-M) tablet Take 1 tablet by mouth daily.  . Insulin Syringe-Needle U-100 31G X 1/4" 1 ML MISC 1 Syringe by Does not apply route 2 (two) times daily.  . [DISCONTINUED] INSULIN SYRINGE 1CC/29G 29G X 1/2" 1 ML MISC 1 Syringe by Does not apply route  2 (two) times daily.   No facility-administered encounter medications on file as of 05/19/2018.     Activities of Daily Living In your present state of health, do you have any difficulty performing the following activities: 05/19/2018  Hearing? Y  Comment Ringing in right ear, has seen audiologist   Vision? N  Difficulty concentrating or making decisions? Y  Comment Trouble remembering names of acquaintances at times  Walking or climbing stairs? N  Dressing or bathing? N  Doing errands, shopping? N  Preparing Food and eating ? N  Using the Toilet? N  In the past six months, have you accidently leaked urine? N  Do you have problems with loss of bowel control? N  Managing your Medications? N  Managing your Finances? N  Housekeeping or managing your Housekeeping? N  Some recent data might be hidden    Patient Care Team: Dettinger, Fransisca Kaufmann, MD as PCP - General (Family Medicine) Gala Romney Cristopher Estimable, MD as Consulting Physician (Gastroenterology) Starling Manns, MD as Consulting Physician (Orthopedic Surgery) Annitta Needs, NP (Gastroenterology)    Assessment:   This is a routine wellness examination for [place].  Exercise Activities and Dietary recommendations  Patient states she eats 2 meals per day and snacks as needed.  Usually has eggs, bacon, and toast for breakfast, a snack for lunch, protein and vegetables for supper.  Recommended a diet of mostly vegetables, lean proteins, and fruits and whole grains in moderation.   Current Exercise Habits: The patient does not participate in regular exercise at present, Exercise limited by: orthopedic condition(s)  Goals    . Exercise 3x per week (30 min per time)    . HEMOGLOBIN A1C < 7.0      Watch carbohydrate intake and reduce sugary snacks.         Fall Risk Fall Risk  05/19/2018 04/09/2018 12/29/2017 11/27/2017 09/29/2017  Falls in the past year? No No No No No  Number falls in past yr: - - - - -  Injury with Fall? - - - - -   Is the patient's home free of loose throw rugs in walkways, pet beds, electrical cords, etc?   yes      Grab bars in the bathroom? no      Handrails on the stairs?   yes      Adequate lighting?   yes    Depression Screen PHQ 2/9 Scores 05/19/2018 04/09/2018 12/29/2017 11/27/2017  PHQ - 2 Score 0 0 0 0     Cognitive Function MMSE - Mini Mental State Exam 05/19/2018 05/01/2017  Orientation to time 5 5  Orientation to Place 5 5  Registration 3 3  Attention/ Calculation 4 4  Recall 2 2  Language- name 2 objects 2 2  Language- repeat 1 1  Language- follow 3 step command 3 3  Language- read & follow direction 1 1  Write a sentence 1 1  Copy design 1 1  Total score 28 28        Immunization History  Administered Date(s) Administered  . Influenza, High Dose Seasonal PF 05/19/2018  . Influenza,inj,Quad PF,6+ Mos 05/13/2013, 05/27/2014, 06/22/2015, 06/11/2016, 06/26/2017  . Pneumococcal Conjugate-13 07/08/2014  . Pneumococcal Polysaccharide-23 12/16/2016  . Tdap 12/30/2017      Screening Tests Health Maintenance  Topic Date Due  . OPHTHALMOLOGY EXAM  06/18/2016  . INFLUENZA VACCINE  03/12/2018  . HEMOGLOBIN A1C  10/10/2018  . FOOT EXAM  12/30/2018  . MAMMOGRAM  03/09/2020  .  COLONOSCOPY  05/16/2027  . TETANUS/TDAP  12/31/2027  . DEXA SCAN  Completed  . Hepatitis C Screening  Completed  . PNA vac Low Risk Adult  Completed   Patient states she had her eye exam earlier this year, called [facility] in [place] to request results.    Cancer Screenings: Lung: Low Dose CT Chest recommended if Age 95-80 years, 30 pack-year currently smoking OR have quit w/in 15years. Patient does not qualify. Breast:  Up to date on Mammogram? Yes   Up  to date of Bone Density/Dexa? Yes Colorectal: up to date  Additional Screenings:  Hepatitis C Screening:  Completed 12/26/16     Plan:     Work on your goal of decreasing your hemoglobin A1c to less than 7% by watching your carbohydrate intake and avoiding sugary snacks. Review the information given on Advance Directives, and if you complete them please bring a copy to our office to be filed in your chart.  Follow up with Dr. Warrick Parisian as directed.   I have personally reviewed and noted the following in the patient's chart:   . Medical and social history . Use of alcohol, tobacco or illicit drugs  . Current medications and supplements . Functional ability and status . Nutritional status . Physical activity . Advanced directives . List of other physicians . Hospitalizations, surgeries, and ER visits in previous 12 months . Vitals . Screenings to include cognitive, depression, and falls . Referrals and appointments  In addition, I have reviewed and discussed with patient certain preventive protocols, quality metrics, and best practice recommendations. A written personalized care plan for preventive services as well as general preventive health recommendations were provided to patient.     Tymier Lindholm M, RN  05/19/2018   I have reviewed and agree with the above AWV documentation.   Evelina Dun, FNP

## 2018-05-19 NOTE — Patient Instructions (Addendum)
Please work on your goal of decreasing your hemoglobin A1c to less than 7% by watching your carbohydrate intake and avoiding sugary snacks.  Please review the information given on Advance Directives, and if you complete them please bring a copy to our office to be filed in your chart.   Please follow up with Dr. Warrick Parisian as directed.  Thank you for coming in for your Annual Wellness Visit today!   Carbohydrate Counting for Diabetes Mellitus, Adult Carbohydrate counting is a method for keeping track of how many carbohydrates you eat. Eating carbohydrates naturally increases the amount of sugar (glucose) in the blood. Counting how many carbohydrates you eat helps keep your blood glucose within normal limits, which helps you manage your diabetes (diabetes mellitus). It is important to know how many carbohydrates you can safely have in each meal. This is different for every person. A diet and nutrition specialist (registered dietitian) can help you make a meal plan and calculate how many carbohydrates you should have at each meal and snack. Carbohydrates are found in the following foods:  Grains, such as breads and cereals.  Dried beans and soy products.  Starchy vegetables, such as potatoes, peas, and corn.  Fruit and fruit juices.  Milk and yogurt.  Sweets and snack foods, such as cake, cookies, candy, chips, and soft drinks.  How do I count carbohydrates? There are two ways to count carbohydrates in food. You can use either of the methods or a combination of both. Reading "Nutrition Facts" on packaged food The "Nutrition Facts" list is included on the labels of almost all packaged foods and beverages in the [place] It includes:  The serving size.  Information about nutrients in each serving, including the grams (g) of carbohydrate per serving.  To use the "Nutrition Facts":  Decide how many servings you will have.  Multiply the number of servings by the number of carbohydrates per  serving.  The resulting number is the total amount of carbohydrates that you will be having.  Learning standard serving sizes of other foods When you eat foods containing carbohydrates that are not packaged or do not include "Nutrition Facts" on the label, you need to measure the servings in order to count the amount of carbohydrates:  Measure the foods that you will eat with a food scale or measuring cup, if needed.  Decide how many standard-size servings you will eat.  Multiply the number of servings by 15. Most carbohydrate-rich foods have about 15 g of carbohydrates per serving. ? For example, if you eat 8 oz (170 g) of strawberries, you will have eaten 2 servings and 30 g of carbohydrates (2 servings x 15 g = 30 g).  For foods that have more than one food mixed, such as soups and casseroles, you must count the carbohydrates in each food that is included.  The following list contains standard serving sizes of common carbohydrate-rich foods. Each of these servings has about 15 g of carbohydrates:   hamburger bun or  English muffin.   oz (15 mL) syrup.   oz (14 g) jelly.  1 slice of bread.  1 six-inch tortilla.  3 oz (85 g) cooked rice or pasta.  4 oz (113 g) cooked dried beans.  4 oz (113 g) starchy vegetable, such as peas, corn, or potatoes.  4 oz (113 g) hot cereal.  4 oz (113 g) mashed potatoes or  of a large baked potato.  4 oz (113 g) canned or frozen fruit.  4  oz (120 mL) fruit juice.  4-6 crackers.  6 chicken nuggets.  6 oz (170 g) unsweetened dry cereal.  6 oz (170 g) plain fat-free yogurt or yogurt sweetened with artificial sweeteners.  8 oz (240 mL) milk.  8 oz (170 g) fresh fruit or one small piece of fruit.  24 oz (680 g) popped popcorn.  Example of carbohydrate counting Sample meal  3 oz (85 g) chicken breast.  6 oz (170 g) brown rice.  4 oz (113 g) corn.  8 oz (240 mL) milk.  8 oz (170 g) strawberries with sugar-free whipped  topping. Carbohydrate calculation 1. Identify the foods that contain carbohydrates: ? Rice. ? Corn. ? Milk. ? Strawberries. 2. Calculate how many servings you have of each food: ? 2 servings rice. ? 1 serving corn. ? 1 serving milk. ? 1 serving strawberries. 3. Multiply each number of servings by 15 g: ? 2 servings rice x 15 g = 30 g. ? 1 serving corn x 15 g = 15 g. ? 1 serving milk x 15 g = 15 g. ? 1 serving strawberries x 15 g = 15 g. 4. Add together all of the amounts to find the total grams of carbohydrates eaten: ? 30 g + 15 g + 15 g + 15 g = 75 g of carbohydrates total. This information is not intended to replace advice given to you by your health care provider. Make sure you discuss any questions you have with your health care provider. Document Released: 07/29/2005 Document Revised: 02/16/2016 Document Reviewed: 01/10/2016 Elsevier Interactive Patient Education  2018 [place] 65 Years and Older, Female Preventive care refers to lifestyle choices and visits with your health care provider that can promote health and wellness. What does preventive care include?  A yearly physical exam. This is also called an annual well check.  Dental exams once or twice a year.  Routine eye exams. Ask your health care provider how often you should have your eyes checked.  Personal lifestyle choices, including: ? Daily care of your teeth and gums. ? Regular physical activity. ? Eating a healthy diet. ? Avoiding tobacco and drug use. ? Limiting alcohol use. ? Practicing safe sex. ? Taking low-dose aspirin every day. ? Taking vitamin and mineral supplements as recommended by your health care provider. What happens during an annual well check? The services and screenings done by your health care provider during your annual well check will depend on your age, overall health, lifestyle risk factors, and family history of disease. Counseling Your health care  provider may ask you questions about your:  Alcohol use.  Tobacco use.  Drug use.  Emotional well-being.  Home and relationship well-being.  Sexual activity.  Eating habits.  History of falls.  Memory and ability to understand (cognition).  Work and work Statistician.  Reproductive health.  Screening You may have the following tests or measurements:  Height, weight, and BMI.  Blood pressure.  Lipid and cholesterol levels. These may be checked every 5 years, or more frequently if you are over 73 years old.  Skin check.  Lung cancer screening. You may have this screening every year starting at age 6 if you have a 30-pack-year history of smoking and currently smoke or have quit within the past 15 years.  Fecal occult blood test (FOBT) of the stool. You may have this test every year starting at age 80.  Flexible sigmoidoscopy or colonoscopy. You may have a sigmoidoscopy  every 5 years or a colonoscopy every 10 years starting at age 37.  Hepatitis C blood test.  Hepatitis B blood test.  Sexually transmitted disease (STD) testing.  Diabetes screening. This is done by checking your blood sugar (glucose) after you have not eaten for a while (fasting). You may have this done every 1-3 years.  Bone density scan. This is done to screen for osteoporosis. You may have this done starting at age 28.  Mammogram. This may be done every 1-2 years. Talk to your health care provider about how often you should have regular mammograms.  Talk with your health care provider about your test results, treatment options, and if necessary, the need for more tests. Vaccines Your health care provider may recommend certain vaccines, such as:  Influenza vaccine. This is recommended every year.  Tetanus, diphtheria, and acellular pertussis (Tdap, Td) vaccine. You may need a Td booster every 10 years.  Varicella vaccine. You may need this if you have not been vaccinated.  Zoster vaccine.  You may need this after age 33.  Measles, mumps, and rubella (MMR) vaccine. You may need at least one dose of MMR if you were born in 1957 or later. You may also need a second dose.  Pneumococcal 13-valent conjugate (PCV13) vaccine. One dose is recommended after age 38.  Pneumococcal polysaccharide (PPSV23) vaccine. One dose is recommended after age 12.  Meningococcal vaccine. You may need this if you have certain conditions.  Hepatitis A vaccine. You may need this if you have certain conditions or if you travel or work in places where you may be exposed to hepatitis A.  Hepatitis B vaccine. You may need this if you have certain conditions or if you travel or work in places where you may be exposed to hepatitis B.  Haemophilus influenzae type b (Hib) vaccine. You may need this if you have certain conditions.  Talk to your health care provider about which screenings and vaccines you need and how often you need them. This information is not intended to replace advice given to you by your health care provider. Make sure you discuss any questions you have with your health care provider. Document Released: 08/25/2015 Document Revised: 04/17/2016 Document Reviewed: 05/30/2015 Elsevier Interactive Patient Education  [employer].

## 2018-05-27 ENCOUNTER — Telehealth: Payer: Self-pay | Admitting: Family Medicine

## 2018-05-27 NOTE — Telephone Encounter (Signed)
FYI for provider

## 2018-05-27 NOTE — Telephone Encounter (Signed)
Can we see if we have samples for her and she can come pick them up.

## 2018-05-28 NOTE — Telephone Encounter (Signed)
Patient aware samples placed up front.

## 2018-06-06 ENCOUNTER — Other Ambulatory Visit: Payer: Self-pay | Admitting: Surgery

## 2018-06-06 DIAGNOSIS — E042 Nontoxic multinodular goiter: Secondary | ICD-10-CM

## 2018-06-10 ENCOUNTER — Ambulatory Visit
Admission: RE | Admit: 2018-06-10 | Discharge: 2018-06-10 | Disposition: A | Discharge status: Home / Self Care | Payer: Medicare HMO | Source: Ambulatory Visit | Attending: Surgery | Admitting: Surgery

## 2018-06-10 DIAGNOSIS — E042 Nontoxic multinodular goiter: Secondary | ICD-10-CM

## 2018-06-10 DIAGNOSIS — E041 Nontoxic single thyroid nodule: Secondary | ICD-10-CM | POA: Diagnosis not present

## 2018-06-15 ENCOUNTER — Telehealth: Payer: Self-pay | Admitting: Family Medicine

## 2018-06-15 NOTE — Telephone Encounter (Signed)
PT is wanting to speak to Janett Billow because she is aware of her being in her gap, and wants to talk to her about the linagliptin (TRADJENTA) 5 MG TABS tablet, states she is out of them.

## 2018-06-15 NOTE — Telephone Encounter (Signed)
We don't have any available. We only have Jardiance and [place]. Will these work?

## 2018-06-15 NOTE — Telephone Encounter (Signed)
We do not have any samples of tradjenta. Is there anything else she can try that we have samples of? Please review and advise.

## 2018-06-15 NOTE — Telephone Encounter (Signed)
Patient aware and verbalizes understanding - states that the cost of the medication is too high. Gave patient medication assistance program number and patient states she will call them.

## 2018-06-15 NOTE — Telephone Encounter (Signed)
Now she has listed as an allergy of metformin which is probably an intolerance so we cannot give Janumet and Vania Rea is a completely different class and works different so it would not be ideal for her at this point.  If she is having trouble affording her medications and that is why she cannot get it and have her call over to the county health department with what worth and see if they can give her some medication assistance or if she can qualify for it.

## 2018-06-15 NOTE — Telephone Encounter (Signed)
If we have Januvia 100 mg can give her that as well, I do not believe that we carry Onglyza in our sample closet

## 2018-06-25 ENCOUNTER — Encounter: Payer: Self-pay | Admitting: Nurse Practitioner

## 2018-06-25 ENCOUNTER — Ambulatory Visit: Payer: Medicare HMO | Admitting: Nurse Practitioner

## 2018-06-25 VITALS — BP 135/72 | HR 95 | Temp 97.1°F | Ht 63.0 in | Wt 175.4 lb

## 2018-06-25 DIAGNOSIS — R748 Abnormal levels of other serum enzymes: Secondary | ICD-10-CM

## 2018-06-25 DIAGNOSIS — K5000 Crohn's disease of small intestine without complications: Secondary | ICD-10-CM | POA: Diagnosis not present

## 2018-06-25 DIAGNOSIS — K529 Noninfective gastroenteritis and colitis, unspecified: Secondary | ICD-10-CM | POA: Diagnosis not present

## 2018-06-25 NOTE — Assessment & Plan Note (Signed)
Intermittent mild elevation of alkaline phosphatase.  Her last labs were normal.  We will recheck a CMP as part of her routine labs with Crohn's disease which will allow [place] to again evaluate the alk phos elevation.  Overall, given the degree of elevation and the intermittent nature it is not overtly concerning.  We will continue to follow.  Follow-up in 6 months.

## 2018-06-25 NOTE — Patient Instructions (Signed)
1. Continue your current medications. 2. Have your labs drawn when you are able to. 3. We will check to see if a prescription discount program can get a better price in your medication for the next 1 to 2 months.  We will call you with the results. 4. Follow-up in 6 months. 5. Call us if you have any questions or concerns.  At [facility] we value your feedback. You may receive a survey about your visit today. Please share your experience as we strive to create trusting relationships with our patients to provide genuine, compassionate, quality care.  We appreciate your understanding and patience as we review any laboratory studies, imaging, and other diagnostic tests that are ordered as we care for you. Our office policy is 5 business days for review of these results, and any emergent or urgent results are addressed in a timely manner for your best interest. If you do not hear from our office in 1 week, please contact us.   We also encourage the use of MyChart, which contains your medical information for your review as well. If you are not enrolled in this feature, an access code is on this after visit summary for your convenience. Thank you for allowing [place] to be involved in your care.  It was great to meet you today!  I hope you have a great Fall!!

## 2018-06-25 NOTE — Assessment & Plan Note (Signed)
The patient has chronic diarrhea.  Overall, it seems her Crohn's is well controlled.  Previous colonoscopy looked pretty good.  She typically takes Imodium which she feels works better than Pepto-Bismol.  However, she has a new bottle of Pepto-Bismol and she will finish that before going back to Imodium.  Overall she feels she is doing well.  We will check labs today, follow-up in 6 months.

## 2018-06-25 NOTE — Progress Notes (Signed)
Referring Provider: Dettinger, Fransisca Kaufmann, MD Primary Care Physician:  Dettinger, Fransisca Kaufmann, MD Primary GI:  Dr. Gala Romney  Chief Complaint  Patient presents with  . Crohn's Disease    f/u.  [name] Diarrhea    about 3 times per day, sometimes up to 5-6 times    HPI:   Felicia Pugh is a 72 y.o. female who presents for follow-up on Crohn's disease.  The patient was last in our office to 12/22/2017 for Crohn's and elevated alkaline phosphatase.  Colonoscopy updated with focal active ileitis otherwise normal colon.  Insurance would not approve Lialda but covered Colazal and Bentyl for IBS overlay.  Taking half dose of Imodium daily, no cramping.  Soft stools but no watery diarrhea.  Does not feel like Bentyl makes much of a difference.  Imodium has helped.  No rectal bleeding.  Isolated mildly elevated alkaline phosphatase in November and May 2018, mild fatty liver on prior imaging.  Recommended stop Bentyl, check labs with PCP, follow-up in 6 months.  Labs drawn 12/29/2017 showed elevated alkaline phosphatase mildly at 129.  On 03/30/2018 HFP was completely normal including alkaline phosphatase, GGT was normal.  Today she states she's doing well overall. Denies abdominal pain. Has persistent, chronic diarrhea. Bentyl did not help. Uses Pepto Bismol helps calm down her diarrhea. If she takes Pepto will have 2-3 loose stools a day, without will have 5-6 stools a day. Imodium works better, but has a full bottle of Pepto and wants to finish that first. Stools are soft but not diarrhea/watery. Stress tends to trigger worsening diarrhea. Certain triggers (salads) can trigger looser stools. Denies N/V, fever, chills, unintentional weight loss. Energy level is ok and can do things she needs to. Appetite is good. Denies chest pain, dyspnea, dizziness, lightheadedness, syncope, near syncope. Denies any other upper or lower GI symptoms.  Past Medical History:  Diagnosis Date  . Anemia    H/O myelodysplastic anemis?  used to see Dr. Sonny Dandy, no longer seeing anyone (02/2015)  . Cataract   . Complication of anesthesia    difficulty breathing after waking up from anesthesia  . Crohn's disease ([place])   . Diabetes mellitus without complication ([place])   . Diabetic neuropathy ([place])   . Hyperlipidemia   . Hypertension   . IBS (irritable bowel syndrome)   . Stroke [facility]) 2011  . Syncope and collapse   . Thyroid nodule   . Ulcer     Past Surgical History:  Procedure Laterality Date  . ABDOMINAL HYSTERECTOMY     partial  . BACK SURGERY     x3 (Dr Loyola Mast, Dr Patrice Paradise)  . BREAST SURGERY Right 2002   cyst removed  . COLONOSCOPY  10/102006   RMR: normal  . COLONOSCOPY  09/06/2009   RMR: Suboptimal prep on the right side. Ulcers noted at cecum and in the descending colon. Terminal ileum could not be intubated. Ounces with chronic active colitis, question Crohn's  . COLONOSCOPY N/A 03/27/2015   Dr. Gala Romney: abnormal cecum and IC valve most consistent with IBD. TI intubated and appeared normal. Likely Crohn's disease. Patient denies NSAID use. No improvement with Lialda samples at time of initial diagnosis  . COLONOSCOPY N/A 05/15/2017   Dr. Gala Romney: ab normal IC valve, s/p biopsy with active ileitis, diverticulosis in sigmoid and descending colon. Non-bleeding internal hemorrhoids  . ESOPHAGOGASTRODUODENOSCOPY    . LAPAROSCOPIC APPENDECTOMY N/A 07/21/2016   Procedure: APPENDECTOMY LAPAROSCOPIC;  Surgeon: Aviva Signs, MD;  Location: [facility];  Service: General;  Laterality: N/A;  . Small bowel capsule endoscopy  06/10/2005   RMR: single erosion versus AVM, distal ileum  . Small bowel capsule endoscopy  prior to 05/2005   [place]: reported erosions/ulcerations per medical record. actual report unavailable.    Current Outpatient Medications  Medication Sig Dispense Refill  . atorvastatin (LIPITOR) 40 MG tablet Take 1 tablet (40 mg total) by mouth daily. 90 tablet 3  . balsalazide (COLAZAL) 750 MG capsule Take 3 capsules  (2,250 mg total) by mouth 3 (three) times daily. 810 capsule 2  . bismuth subsalicylate (PEPTO BISMOL) 262 MG/15ML suspension 1 tsp in the AM as needed    . ferrous sulfate (IRON SUPPLEMENT) 325 (65 FE) MG tablet Take 325 mg by mouth daily.    . insulin NPH Human (NOVOLIN N RELION) 100 UNIT/ML injection INJECT 26 UNITS IN THE MORNING AND 15 UNITS AT NIGHT 30 mL 4  . Insulin Syringe-Needle U-100 31G X 1/4" 1 ML MISC 1 Syringe by Does not apply route 2 (two) times daily. 60 each 6  . linagliptin (TRADJENTA) 5 MG TABS tablet Take 1 tablet (5 mg total) by mouth daily. 90 tablet 3  . losartan-hydrochlorothiazide (HYZAAR) 100-25 MG tablet Take 1 tablet by mouth daily. 90 tablet 3  . ONETOUCH VERIO test strip USE 1 STRIP TO CHECK GLUCOSE TWICE DAILY  3  . therapeutic multivitamin-minerals (THERAGRAN-M) tablet Take 1 tablet by mouth daily.     No current facility-administered medications for this visit.     Allergies as of 06/25/2018 - Review Complete 06/25/2018  Allergen Reaction Noted  . Metformin and related  05/11/2014  . Asa [aspirin]  11/04/2012  . Nsaids  11/04/2012  . Other Diarrhea 12/25/2014  . Tolmetin Other (See Comments) 11/04/2012    Family History  Problem Relation Age of Onset  . Hypertension Mother   . Stroke Mother   . Diabetes Father        w/ BKA  . Hypertension Father   . Asthma Father   . Cancer Sister   . Diabetes Sister        stomach  . Diabetes Brother   . Diabetes Sister   . Cancer Sister        breast  . Hypertension Sister   . Hypertension Daughter   . Hypertension Son   . Multiple sclerosis Son   . Colon cancer Neg Hx   . Inflammatory bowel disease Neg Hx     Social History   Socioeconomic History  . Marital status: Married    Spouse name: Not on file  . Number of children: 5  . Years of education: Not on file  . Highest education level: Not on file  Occupational History  . Not on file  Social Needs  . Financial resource strain: Not on  file  . Food insecurity:    Worry: Not on file    Inability: Not on file  . Transportation needs:    Medical: Not on file    Non-medical: Not on file  Tobacco Use  . Smoking status: Never Smoker  . Smokeless tobacco: Never Used  Substance and Sexual Activity  . Alcohol use: No  . Drug use: No  . Sexual activity: Never  Lifestyle  . Physical activity:    Days per week: Not on file    Minutes per session: Not on file  . Stress: Not on file  Relationships  . Social connections:    Talks on phone:  Not on file    Gets together: Not on file    Attends religious service: Not on file    Active member of club or organization: Not on file    Attends meetings of clubs or organizations: Not on file    Relationship status: Not on file  Other Topics Concern  . Not on file  Social History Narrative  . Not on file    Review of Systems: General: Negative for anorexia, weight loss, fever, chills, fatigue, weakness. ENT: Negative for hoarseness, difficulty swallowing. CV: Negative for chest pain, angina, palpitations, peripheral edema.  Respiratory: Negative for dyspnea at rest, cough, sputum, wheezing.  GI: See history of present illness. MS: Somewhat worse back pain (due to increased activity with new grandson).  Derm: Negative for rash or itching.  Endo: Negative for unusual weight change.  Heme: Negative for bruising or bleeding.   Physical Exam: BP 135/72   Pulse 95   Temp (!) 97.1 F (36.2 C) (Oral)   Ht 5' 3"  (1.6 m)   Wt 175 lb 6.4 oz (79.6 kg)   BMI 31.07 kg/m  General:   Alert and oriented. Pleasant and cooperative. Well-nourished and well-developed.  Eyes:  Without icterus, sclera clear and conjunctiva pink.  Ears:  Normal auditory acuity. Cardiovascular:  S1, S2 present without murmurs appreciated. Extremities without clubbing or edema. Respiratory:  Clear to auscultation bilaterally. No wheezes, rales, or rhonchi. No distress.  Gastrointestinal:  +BS, soft,  non-tender and non-distended. No HSM noted. No guarding or rebound. No masses appreciated.  Rectal:  Deferred  Musculoskalatal:  Symmetrical without gross deformities. Skin:  Intact without significant lesions or rashes. Neurologic:  Alert and oriented x4;  grossly normal neurologically. Psych:  Alert and cooperative. Normal mood and affect. Heme/Lymph/Immune: No excessive bruising noted.    06/25/2018 12:18 PM   Disclaimer: This note was dictated with voice recognition software. Similar sounding words can inadvertently be transcribed and may not be corrected upon review.

## 2018-06-25 NOTE — Assessment & Plan Note (Signed)
Crohn's disease which seems to be pretty well controlled on Colazal.  She does not have enough medication to last at the end of the year and is in the donut hole.  We will examine good Rx and other prescription discount program options to help get her to the beginning of the year when the donut hole resets.  She does have chronic diarrhea, as per below.  Imodium and/or Pepto-Bismol tend to work.  Stress seems to trigger her worsening diarrhea symptoms.  No abdominal pain.  No hematochezia.  At this point I will check CBC, CMP, CRP, ESR for routine labs and Crohn's status serology.  Follow-up in 6 months.

## 2018-06-25 NOTE — Progress Notes (Signed)
CC'ED TO PCP 

## 2018-06-26 LAB — COMPREHENSIVE METABOLIC PANEL
AG RATIO: 1.5 (calc) (ref 1.0–2.5)
ALBUMIN MSPROF: 4.4 g/dL (ref 3.6–5.1)
ALT: 13 U/L (ref 6–29)
AST: 19 U/L (ref 10–35)
Alkaline phosphatase (APISO): 128 U/L (ref 33–130)
BUN: 21 mg/dL (ref 7–25)
CO2: 30 mmol/L (ref 20–32)
Calcium: 10.2 mg/dL (ref 8.6–10.4)
Chloride: 104 mmol/L (ref 98–110)
Creat: 0.75 mg/dL (ref 0.60–0.93)
Globulin: 2.9 g/dL (calc) (ref 1.9–3.7)
Glucose, Bld: 148 mg/dL — ABNORMAL HIGH (ref 65–139)
POTASSIUM: 4 mmol/L (ref 3.5–5.3)
SODIUM: 142 mmol/L (ref 135–146)
TOTAL PROTEIN: 7.3 g/dL (ref 6.1–8.1)
Total Bilirubin: 0.5 mg/dL (ref 0.2–1.2)

## 2018-06-26 LAB — CBC WITH DIFFERENTIAL/PLATELET
BASOS ABS: 50 {cells}/uL (ref 0–200)
Basophils Relative: 0.6 %
EOS PCT: 1.5 %
Eosinophils Absolute: 125 cells/uL (ref 15–500)
HEMATOCRIT: 34.7 % — AB (ref 35.0–45.0)
HEMOGLOBIN: 11.5 g/dL — AB (ref 11.7–15.5)
LYMPHS ABS: 2067 {cells}/uL (ref 850–3900)
MCH: 28.5 pg (ref 27.0–33.0)
MCHC: 33.1 g/dL (ref 32.0–36.0)
MCV: 86.1 fL (ref 80.0–100.0)
MPV: 9.7 fL (ref 7.5–12.5)
Monocytes Relative: 6.5 %
NEUTROS ABS: 5520 {cells}/uL (ref 1500–7800)
Neutrophils Relative %: 66.5 %
Platelets: 339 10*3/uL (ref 140–400)
RBC: 4.03 10*6/uL (ref 3.80–5.10)
RDW: 12.5 % (ref 11.0–15.0)
Total Lymphocyte: 24.9 %
WBC: 8.3 10*3/uL (ref 3.8–10.8)
WBCMIX: 540 {cells}/uL (ref 200–950)

## 2018-06-26 LAB — C-REACTIVE PROTEIN: CRP: 2.1 mg/L (ref <8.0)

## 2018-06-26 LAB — SEDIMENTATION RATE: Sed Rate: 14 mm/h (ref 0–30)

## 2018-07-10 ENCOUNTER — Ambulatory Visit (INDEPENDENT_AMBULATORY_CARE_PROVIDER_SITE_OTHER): Payer: Medicare HMO | Admitting: Family Medicine

## 2018-07-10 ENCOUNTER — Encounter: Payer: Self-pay | Admitting: Family Medicine

## 2018-07-10 VITALS — BP 124/74 | HR 78 | Temp 97.6°F | Ht 63.0 in | Wt 176.4 lb

## 2018-07-10 DIAGNOSIS — E785 Hyperlipidemia, unspecified: Secondary | ICD-10-CM | POA: Diagnosis not present

## 2018-07-10 DIAGNOSIS — E669 Obesity, unspecified: Secondary | ICD-10-CM

## 2018-07-10 DIAGNOSIS — I10 Essential (primary) hypertension: Secondary | ICD-10-CM

## 2018-07-10 DIAGNOSIS — K219 Gastro-esophageal reflux disease without esophagitis: Secondary | ICD-10-CM

## 2018-07-10 DIAGNOSIS — E1169 Type 2 diabetes mellitus with other specified complication: Secondary | ICD-10-CM | POA: Diagnosis not present

## 2018-07-10 DIAGNOSIS — E1159 Type 2 diabetes mellitus with other circulatory complications: Secondary | ICD-10-CM | POA: Diagnosis not present

## 2018-07-10 DIAGNOSIS — I152 Hypertension secondary to endocrine disorders: Secondary | ICD-10-CM

## 2018-07-10 LAB — BAYER DCA HB A1C WAIVED: HB A1C: 8.2 % — AB (ref <7.0)

## 2018-07-10 NOTE — Progress Notes (Signed)
BP 124/74   Pulse 78   Temp 97.6 F (36.4 C)   Ht _0  (1.6 m)   Wt 176 lb 6.4 oz (80 kg)   BMI 31.25 kg/m    Subjective:    Patient ID: Felicia Pugh, female    DOB: 1945/09/01, 72 y.o.   MRN: [id number]  HPI: Felicia Pugh is a 72 y.o. female presenting on 07/10/2018 for Medical Management of Chronic Issues and Back Pain   HPI Type 2 diabetes mellitus Patient comes in today for recheck of his diabetes. Patient has been currently taking Tradjenta and NPH. Patient is currently on an ACE inhibitor/ARB. Patient has not seen an ophthalmologist this year. Patient denies any issues with their feet.   Hypertension Patient is currently on losartan-hydrochlorthiazide, and their blood pressure today is 124/74. Patient denies any lightheadedness or dizziness. Patient denies headaches, blurred vision, chest pains, shortness of breath, or weakness. Denies any side effects from medication and is content with current medication.   Hyperlipidemia Patient is coming in for recheck of his hyperlipidemia. The patient is currently taking Lipitor. They deny any issues with myalgias or history of liver damage from it. They deny any focal numbness or weakness or chest pain.   GERD Patient is currently on no medication and has been doing well.  She denies any major symptoms or abdominal pain or belching or burping. She denies any blood in her stool or lightheadedness or dizziness.   Relevant past medical, surgical, family and social history reviewed and updated as indicated. Interim medical history since our last visit reviewed. Allergies and medications reviewed and updated.  Review of Systems  Constitutional: Negative for chills and fever.  HENT: Negative for congestion, ear discharge and ear pain.   Eyes: Negative for redness and visual disturbance.  Respiratory: Negative for chest tightness and shortness of breath.   Cardiovascular: Negative for chest pain and leg swelling.  Genitourinary:  Negative for difficulty urinating and dysuria.  Musculoskeletal: Negative for back pain and gait problem.  Skin: Negative for rash.  Neurological: Negative for dizziness, light-headedness and headaches.  Psychiatric/Behavioral: Negative for agitation and behavioral problems.  All other systems reviewed and are negative.   Per HPI unless specifically indicated above   Allergies as of 07/10/2018      Reactions   Metformin And Related    Severe diarrhea   Asa [aspirin]    GI Dr does not want her to take due to hx ulcers   Nsaids    GI Dr does not want her to take due to hx of ulcers   Other Diarrhea   Steroids   Tolmetin Other (See Comments)   GI Dr does not want her to take due to hx of ulcers GI Dr does not want her to take due to hx of ulcers      Medication List        Accurate as of 07/10/18 11:59 PM. Always use your most recent med list.          atorvastatin 40 MG tablet Commonly known as:  LIPITOR Take 1 tablet (40 mg total) by mouth daily.   balsalazide 750 MG capsule Commonly known as:  COLAZAL Take 3 capsules (2,250 mg total) by mouth 3 (three) times daily.   bismuth subsalicylate 193 XT/02IO suspension Commonly known as:  PEPTO BISMOL 1 tsp in the AM as needed   insulin NPH Human 100 UNIT/ML injection Commonly known as:  HUMULIN N,NOVOLIN N INJECT 26  UNITS IN THE MORNING AND 15 UNITS AT NIGHT   Insulin Syringe-Needle U-100 31G X 1/4" 1 ML Misc 1 Syringe by Does not apply route 2 (two) times daily.   IRON SUPPLEMENT 325 (65 FE) MG tablet Generic drug:  ferrous sulfate Take 325 mg by mouth daily.   linagliptin 5 MG Tabs tablet Commonly known as:  TRADJENTA Take 1 tablet (5 mg total) by mouth daily.   losartan-hydrochlorothiazide 100-25 MG tablet Commonly known as:  HYZAAR Take 1 tablet by mouth daily.   ONETOUCH VERIO test strip Generic drug:  glucose blood USE 1 STRIP TO CHECK GLUCOSE TWICE DAILY   therapeutic multivitamin-minerals  tablet Take 1 tablet by mouth daily.          Objective:    BP 124/74   Pulse 78   Temp 97.6 F (36.4 C)   Ht _0  (1.6 m)   Wt 176 lb 6.4 oz (80 kg)   BMI 31.25 kg/m   Wt Readings from Last 3 Encounters:  07/10/18 176 lb 6.4 oz (80 kg)  06/25/18 175 lb 6.4 oz (79.6 kg)  05/19/18 176 lb (79.8 kg)    Physical Exam  Constitutional: She is oriented to person, place, and time. She appears well-developed and well-nourished. No distress.  Eyes: Pupils are equal, round, and reactive to light. Conjunctivae and EOM are normal.  Neck: Neck supple. No thyromegaly present.  Cardiovascular: Normal rate, regular rhythm, normal heart sounds and intact distal pulses.  No murmur heard. Pulmonary/Chest: Effort normal and breath sounds normal. No respiratory distress. She has no wheezes.  Musculoskeletal: Normal range of motion. She exhibits no edema or tenderness.  Lymphadenopathy:    She has no cervical adenopathy.  Neurological: She is alert and oriented to person, place, and time. Coordination normal.  Skin: Skin is warm and dry. No rash noted. She is not diaphoretic.  Psychiatric: She has a normal mood and affect. Her behavior is normal.  Nursing note and vitals reviewed.   Results for orders placed or performed in visit on 07/10/18  Bayer DCA Hb A1c Waived  Result Value Ref Range   HB A1C (BAYER DCA - WAIVED) 8.2 (H) <7.0 %  CMP14+EGFR  Result Value Ref Range   Glucose 151 (H) 65 - 99 mg/dL   BUN 16 8 - 27 mg/dL   Creatinine, Ser 0.81 0.57 - 1.00 mg/dL   GFR calc non Af Amer 73 >59 mL/min/1.73   GFR calc Af Amer 84 >59 mL/min/1.73   BUN/Creatinine Ratio 20 12 - 28   Sodium 145 (H) 134 - 144 mmol/L   Potassium 4.1 3.5 - 5.2 mmol/L   Chloride 106 96 - 106 mmol/L   CO2 22 20 - 29 mmol/L   Calcium 9.5 8.7 - 10.3 mg/dL   Total Protein 6.9 6.0 - 8.5 g/dL   Albumin 4.2 3.5 - 4.8 g/dL   Globulin, Total 2.7 1.5 - 4.5 g/dL   Albumin/Globulin Ratio 1.6 1.2 - 2.2   Bilirubin Total  0.3 0.0 - 1.2 mg/dL   Alkaline Phosphatase 118 (H) 39 - 117 IU/L   AST 18 0 - 40 IU/L   ALT 12 0 - 32 IU/L  CBC with Differential/Platelet  Result Value Ref Range   WBC 8.5 3.4 - 10.8 x10E3/uL   RBC 3.56 (L) 3.77 - 5.28 x10E6/uL   Hemoglobin 10.1 (L) 11.1 - 15.9 g/dL   Hematocrit 32.0 (L) 34.0 - 46.6 %   MCV 90 79 - 97 fL  MCH 28.4 26.6 - 33.0 pg   MCHC 31.6 31.5 - 35.7 g/dL   RDW 12.5 12.3 - 15.4 %   Platelets 307 150 - 450 x10E3/uL   Neutrophils 68 Not Estab. %   Lymphs 21 Not Estab. %   Monocytes 8 Not Estab. %   Eos 2 Not Estab. %   Basos 1 Not Estab. %   Neutrophils Absolute 5.8 1.4 - 7.0 x10E3/uL   Lymphocytes Absolute 1.8 0.7 - 3.1 x10E3/uL   Monocytes Absolute 0.7 0.1 - 0.9 x10E3/uL   EOS (ABSOLUTE) 0.2 0.0 - 0.4 x10E3/uL   Basophils Absolute 0.1 0.0 - 0.2 x10E3/uL   Immature Granulocytes 0 Not Estab. %   Immature Grans (Abs) 0.0 0.0 - 0.1 x10E3/uL  Lipid panel  Result Value Ref Range   Cholesterol, Total 173 100 - 199 mg/dL   Triglycerides 64 0 - 149 mg/dL   HDL 76 >39 mg/dL   VLDL Cholesterol Cal 13 5 - 40 mg/dL   LDL Calculated 84 0 - 99 mg/dL   Chol/HDL Ratio 2.3 0.0 - 4.4 ratio      Assessment & Plan:   Problem List Items Addressed This Visit      Cardiovascular and Mediastinum   Hypertension associated with diabetes ([place])     Digestive   GERD (gastroesophageal reflux disease)   Relevant Orders   CBC with Differential/Platelet (Completed)     Endocrine   Hyperlipidemia associated with type 2 diabetes mellitus ([place])   Relevant Orders   Lipid panel (Completed)   Type 2 diabetes mellitus with other specified complication ([place]) - Primary   Relevant Orders   Bayer DCA Hb A1c Waived (Completed)   CMP14+EGFR (Completed)     Other   Obesity (BMI 30-39.9)      No mood changes in medication and continue current medication. Follow up plan: Return in about 3 months (around 10/10/2018), or if symptoms worsen or fail to improve, for Diabetes  recheck.  Counseling provided for all of the vaccine components Orders Placed This Encounter  Procedures  . Bayer DCA Hb A1c Waived  . CMP14+EGFR  . CBC with Differential/Platelet  . Lipid panel    Caryl Pina, MD [facility] 07/15/2018, 10:35 PM

## 2018-07-11 LAB — CBC WITH DIFFERENTIAL/PLATELET
Basophils Absolute: 0.1 10*3/uL (ref 0.0–0.2)
Basos: 1 %
EOS (ABSOLUTE): 0.2 10*3/uL (ref 0.0–0.4)
EOS: 2 %
Hematocrit: 32 % — ABNORMAL LOW (ref 34.0–46.6)
Hemoglobin: 10.1 g/dL — ABNORMAL LOW (ref 11.1–15.9)
Immature Grans (Abs): 0 10*3/uL (ref 0.0–0.1)
Immature Granulocytes: 0 %
Lymphocytes Absolute: 1.8 10*3/uL (ref 0.7–3.1)
Lymphs: 21 %
MCH: 28.4 pg (ref 26.6–33.0)
MCHC: 31.6 g/dL (ref 31.5–35.7)
MCV: 90 fL (ref 79–97)
Monocytes Absolute: 0.7 10*3/uL (ref 0.1–0.9)
Monocytes: 8 %
Neutrophils Absolute: 5.8 10*3/uL (ref 1.4–7.0)
Neutrophils: 68 %
PLATELETS: 307 10*3/uL (ref 150–450)
RBC: 3.56 x10E6/uL — ABNORMAL LOW (ref 3.77–5.28)
RDW: 12.5 % (ref 12.3–15.4)
WBC: 8.5 10*3/uL (ref 3.4–10.8)

## 2018-07-11 LAB — LIPID PANEL
Chol/HDL Ratio: 2.3 ratio (ref 0.0–4.4)
Cholesterol, Total: 173 mg/dL (ref 100–199)
HDL: 76 mg/dL (ref >39)
LDL Calculated: 84 mg/dL (ref 0–99)
TRIGLYCERIDES: 64 mg/dL (ref 0–149)
VLDL Cholesterol Cal: 13 mg/dL (ref 5–40)

## 2018-07-11 LAB — CMP14+EGFR
ALT: 12 IU/L (ref 0–32)
AST: 18 IU/L (ref 0–40)
Albumin/Globulin Ratio: 1.6 (ref 1.2–2.2)
Albumin: 4.2 g/dL (ref 3.5–4.8)
Alkaline Phosphatase: 118 IU/L — ABNORMAL HIGH (ref 39–117)
BUN/Creatinine Ratio: 20 (ref 12–28)
BUN: 16 mg/dL (ref 8–27)
Bilirubin Total: 0.3 mg/dL (ref 0.0–1.2)
CO2: 22 mmol/L (ref 20–29)
Calcium: 9.5 mg/dL (ref 8.7–10.3)
Chloride: 106 mmol/L (ref 96–106)
Creatinine, Ser: 0.81 mg/dL (ref 0.57–1.00)
GFR calc non Af Amer: 73 mL/min/{1.73_m2} (ref >59)
GFR, EST AFRICAN AMERICAN: 84 mL/min/{1.73_m2} (ref >59)
Globulin, Total: 2.7 g/dL (ref 1.5–4.5)
Glucose: 151 mg/dL — ABNORMAL HIGH (ref 65–99)
Potassium: 4.1 mmol/L (ref 3.5–5.2)
Sodium: 145 mmol/L — ABNORMAL HIGH (ref 134–144)
TOTAL PROTEIN: 6.9 g/dL (ref 6.0–8.5)

## 2018-08-26 ENCOUNTER — Telehealth: Payer: Self-pay | Admitting: *Deleted

## 2018-08-26 MED ORDER — HYDROCHLOROTHIAZIDE 25 MG PO TABS: 25.0000 mg | ORAL_TABLET | Freq: Every day | ORAL | 0 refills | Status: DC

## 2018-08-26 MED ORDER — LOSARTAN POTASSIUM 100 MG PO TABS: 100.0000 mg | ORAL_TABLET | Freq: Every day | ORAL | 0 refills | Status: DC

## 2018-08-26 NOTE — Telephone Encounter (Signed)
Yes go ahead and split the med into 2 separate prescriptions, losartan 100 and hydrochlorothiazide 25 and give her a months worth and hopefully will be back for the next month.

## 2018-08-26 NOTE — Telephone Encounter (Signed)
Fax from [address] 100-25 mg on backorder Request to split Rx, if appropriate send in new Rx

## 2018-09-22 ENCOUNTER — Telehealth: Payer: Self-pay | Admitting: Internal Medicine

## 2018-09-22 NOTE — Telephone Encounter (Signed)
Recall mailed 

## 2018-09-22 NOTE — Telephone Encounter (Signed)
Repeat dexa scan

## 2018-09-23 ENCOUNTER — Other Ambulatory Visit: Payer: Self-pay | Admitting: Family Medicine

## 2018-09-28 ENCOUNTER — Other Ambulatory Visit: Payer: Self-pay

## 2018-09-28 ENCOUNTER — Telehealth: Payer: Self-pay

## 2018-09-28 DIAGNOSIS — K50919 Crohn's disease, unspecified, with unspecified complications: Secondary | ICD-10-CM

## 2018-09-28 DIAGNOSIS — M8580 Other specified disorders of bone density and structure, unspecified site: Secondary | ICD-10-CM

## 2018-09-28 NOTE — Telephone Encounter (Signed)
Pt called office and LMOVM to schedule bone density test.  Bone Density scheduled for 11/02/18 at 1:00pm, arrive at 12:45pm. No calcium/calcium supplements 24-48 hours prior to test. Take list of meds to appt. Called and informed pt of appt. Letter mailed.  No PA needed per [facility].

## 2018-09-29 ENCOUNTER — Other Ambulatory Visit: Payer: Self-pay | Admitting: *Deleted

## 2018-09-29 MED ORDER — HYDROCHLOROTHIAZIDE 25 MG PO TABS: 25.0000 mg | ORAL_TABLET | Freq: Every day | ORAL | 0 refills | Status: DC

## 2018-09-29 MED ORDER — LOSARTAN POTASSIUM 100 MG PO TABS: 100.0000 mg | ORAL_TABLET | Freq: Every day | ORAL | 0 refills | Status: DC

## 2018-09-29 NOTE — Addendum Note (Signed)
Addended by: Antonietta Barcelona D on: 09/29/2018 12:08 PM   Modules accepted: Orders

## 2018-10-12 ENCOUNTER — Encounter: Payer: Self-pay | Admitting: Family Medicine

## 2018-10-12 ENCOUNTER — Ambulatory Visit (INDEPENDENT_AMBULATORY_CARE_PROVIDER_SITE_OTHER): Payer: Medicare HMO | Admitting: Family Medicine

## 2018-10-12 VITALS — BP 118/67 | HR 102 | Temp 97.8°F | Ht 63.0 in | Wt 180.6 lb

## 2018-10-12 DIAGNOSIS — Z794 Long term (current) use of insulin: Secondary | ICD-10-CM

## 2018-10-12 DIAGNOSIS — A084 Viral intestinal infection, unspecified: Secondary | ICD-10-CM | POA: Diagnosis not present

## 2018-10-12 DIAGNOSIS — E1169 Type 2 diabetes mellitus with other specified complication: Secondary | ICD-10-CM | POA: Diagnosis not present

## 2018-10-12 DIAGNOSIS — I10 Essential (primary) hypertension: Secondary | ICD-10-CM

## 2018-10-12 DIAGNOSIS — E1159 Type 2 diabetes mellitus with other circulatory complications: Secondary | ICD-10-CM | POA: Diagnosis not present

## 2018-10-12 DIAGNOSIS — I152 Hypertension secondary to endocrine disorders: Secondary | ICD-10-CM

## 2018-10-12 DIAGNOSIS — E785 Hyperlipidemia, unspecified: Secondary | ICD-10-CM | POA: Diagnosis not present

## 2018-10-12 LAB — BAYER DCA HB A1C WAIVED: HB A1C (BAYER DCA - WAIVED): 8.1 % — ABNORMAL HIGH (ref <7.0)

## 2018-10-12 MED ORDER — ONDANSETRON 4 MG PO TBDP: 4.0000 mg | ORAL_TABLET | Freq: Three times a day (TID) | ORAL | 0 refills | Status: DC | PRN

## 2018-10-12 NOTE — Progress Notes (Signed)
BP 118/67   Pulse (!) 102   Temp 97.8 F (36.6 C) (Oral)   Ht 5' 3"  (1.6 m)   Wt 180 lb 9.6 oz (81.9 kg)   BMI 31.99 kg/m    Subjective:    Patient ID: Felicia Pugh, female    DOB: [birth year], 73 y.o.   MRN: [id number]  HPI: KELTY SZAFRAN is a 73 y.o. female presenting on 10/12/2018 for Diabetes (3 month follow up); Hypertension; Diarrhea (x 2 days); and Emesis   HPI Type 2 diabetes mellitus Patient comes in today for recheck of his diabetes. Patient has been currently taking insulin Novolin 20 in the morning and 15 at night. Patient is currently on an ACE inhibitor/ARB. Patient has not seen an ophthalmologist this year. Patient denies any issues with their feet.   Hypertension Patient is currently on losartan-hydrochlorothiazide, and their blood pressure today is 118/67. Patient denies any lightheadedness or dizziness. Patient denies headaches, blurred vision, chest pains, shortness of breath, or weakness. Denies any side effects from medication and is content with current medication.   Hyperlipidemia Patient is coming in for recheck of his hyperlipidemia. The patient is currently taking Lipitor. They deny any issues with myalgias or history of liver damage from it. They deny any focal numbness or weakness or chest pain.   Nausea vomiting diarrhea x2 days Patient comes in complaining of nausea and vomiting that is been going on for the past 2 days and then developing into diarrhea over the past day.  She denies any fevers or chills.  She does have some generalized abdominal discomfort and cramping and bloating associated with it.  She does say there has been some stomach viruses going around.  She denies any blood in her vomit or blood in her stool.  Relevant past medical, surgical, family and social history reviewed and updated as indicated. Interim medical history since our last visit reviewed. Allergies and medications reviewed and updated.  Review of Systems  Constitutional:  Negative for chills and fever.  Eyes: Negative for redness and visual disturbance.  Respiratory: Negative for chest tightness and shortness of breath.   Cardiovascular: Negative for chest pain and leg swelling.  Gastrointestinal: Positive for abdominal pain, diarrhea, nausea and vomiting. Negative for blood in stool and constipation.  Musculoskeletal: Negative for back pain and gait problem.  Skin: Negative for rash.  Neurological: Negative for light-headedness and headaches.  Psychiatric/Behavioral: Negative for agitation and behavioral problems.  All other systems reviewed and are negative.   Per HPI unless specifically indicated above   Allergies as of 10/12/2018      Reactions   Metformin And Related    Severe diarrhea   Asa [aspirin]    GI Dr does not want her to take due to hx ulcers   Nsaids    GI Dr does not want her to take due to hx of ulcers   Other Diarrhea   Steroids   Tolmetin Other (See Comments)   GI Dr does not want her to take due to hx of ulcers GI Dr does not want her to take due to hx of ulcers      Medication List       Accurate as of October 12, 2018  9:36 AM. Always use your most recent med list.        atorvastatin 40 MG tablet Commonly known as:  LIPITOR Take 1 tablet (40 mg total) by mouth daily.   balsalazide 750 MG capsule Commonly known  as:  COLAZAL Take 3 capsules (2,250 mg total) by mouth 3 (three) times daily.   bismuth subsalicylate 263 ZC/58IF suspension Commonly known as:  PEPTO BISMOL 1 tsp in the AM as needed   insulin NPH Human 100 UNIT/ML injection Commonly known as:  NOVOLIN N RELION INJECT 26 UNITS IN THE MORNING AND 15 UNITS AT NIGHT   Insulin Syringe-Needle U-100 31G X 1/4" 1 ML Misc 1 Syringe by Does not apply route 2 (two) times daily.   IRON SUPPLEMENT 325 (65 FE) MG tablet Generic drug:  ferrous sulfate Take 325 mg by mouth daily.   linagliptin 5 MG Tabs tablet Commonly known as:  TRADJENTA Take 1 tablet (5 mg  total) by mouth daily.   losartan-hydrochlorothiazide 100-25 MG tablet Commonly known as:  HYZAAR Take 1 tablet by mouth daily.   ondansetron 4 MG disintegrating tablet Commonly known as:  ZOFRAN ODT Take 1 tablet (4 mg total) by mouth every 8 (eight) hours as needed for nausea or vomiting.   ONETOUCH VERIO test strip Generic drug:  glucose blood USE 1 STRIP TO CHECK GLUCOSE TWICE DAILY   therapeutic multivitamin-minerals tablet Take 1 tablet by mouth daily.          Objective:    BP 118/67   Pulse (!) 102   Temp 97.8 F (36.6 C) (Oral)   Ht 5' 3"  (1.6 m)   Wt 180 lb 9.6 oz (81.9 kg)   BMI 31.99 kg/m   Wt Readings from Last 3 Encounters:  10/12/18 180 lb 9.6 oz (81.9 kg)  07/10/18 176 lb 6.4 oz (80 kg)  06/25/18 175 lb 6.4 oz (79.6 kg)    Physical Exam Vitals signs and nursing note reviewed.  Constitutional:      General: She is not in acute distress.    Appearance: She is well-developed. She is not diaphoretic.  Eyes:     Conjunctiva/sclera: Conjunctivae normal.  Cardiovascular:     Rate and Rhythm: Normal rate and regular rhythm.     Heart sounds: Normal heart sounds. No murmur.  Pulmonary:     Effort: Pulmonary effort is normal. No respiratory distress.     Breath sounds: Normal breath sounds. No wheezing.  Abdominal:     General: Abdomen is flat. Bowel sounds are normal. There is no distension.     Tenderness: There is no abdominal tenderness. There is no right CVA tenderness, left CVA tenderness, guarding or rebound.  Musculoskeletal: Normal range of motion.        General: No tenderness.  Skin:    General: Skin is warm and dry.     Findings: No rash.  Neurological:     Mental Status: She is alert and oriented to person, place, and time.     Coordination: Coordination normal.  Psychiatric:        Behavior: Behavior normal.        Assessment & Plan:   Problem List Items Addressed This Visit      Cardiovascular and Mediastinum   Hypertension  associated with diabetes ([place])     Endocrine   Hyperlipidemia associated with type 2 diabetes mellitus ([place])   Type 2 diabetes mellitus with other specified complication ([place]) - Primary   Relevant Orders   Bayer DCA Hb A1c Waived (Completed)    Other Visit Diagnoses    Viral gastroenteritis       Relevant Medications   ondansetron (ZOFRAN ODT) 4 MG disintegrating tablet      Continue current medication,  will check labs today, will give Zofran for gastroenteritis and if worsens or develops any fevers or blood she will give [place] a call.  Her blood sugars closely especially when you are ill. Follow up plan: Return in about 3 months (around 01/12/2019), or if symptoms worsen or fail to improve, for Diabetes recheck.  Counseling provided for all of the vaccine components Orders Placed This Encounter  Procedures  . Bayer [facility] Hb A1c [place], MD [facility] 10/12/2018, 9:36 AM

## 2018-11-02 ENCOUNTER — Other Ambulatory Visit (HOSPITAL_COMMUNITY): Payer: Medicare HMO

## 2018-11-05 DIAGNOSIS — R69 Illness, unspecified: Secondary | ICD-10-CM | POA: Diagnosis not present

## 2018-12-23 ENCOUNTER — Ambulatory Visit (INDEPENDENT_AMBULATORY_CARE_PROVIDER_SITE_OTHER): Payer: Medicare HMO | Admitting: Nurse Practitioner

## 2018-12-23 ENCOUNTER — Encounter: Payer: Self-pay | Admitting: Internal Medicine

## 2018-12-23 ENCOUNTER — Other Ambulatory Visit: Payer: Self-pay

## 2018-12-23 ENCOUNTER — Encounter: Payer: Self-pay | Admitting: Nurse Practitioner

## 2018-12-23 DIAGNOSIS — K50919 Crohn's disease, unspecified, with unspecified complications: Secondary | ICD-10-CM

## 2018-12-23 DIAGNOSIS — K529 Noninfective gastroenteritis and colitis, unspecified: Secondary | ICD-10-CM

## 2018-12-23 NOTE — Assessment & Plan Note (Signed)
History of Crohn's disease and ileitis on balsalazide which is work well for her historically.  At last office visit we checked inflammatory markers which are normal.  Other than diarrhea, which is chronic for her, she denies any symptoms suggestive of Crohn's flare such as poor energy, weakness, weight loss, hematochezia.  At this point recommend she continue her current medications.  Will check CBC and CMP for routine monitoring while she is on medication.  Follow-up in 6 months and notify us of any problems before then.

## 2018-12-23 NOTE — Progress Notes (Signed)
Referring Provider: Dettinger, Fransisca Kaufmann, MD Primary Care Physician:  Dettinger, Fransisca Kaufmann, MD Primary GI:  Dr. Gala Romney  NOTE: Service was provided via telemedicine and was requested by the patient due to COVID-19 pandemic.  Method of visit: Doxy.Me  Patient Location: Home  Provider Location: Office  Reason for Phone Visit: Follow-up  The patient was consented to phone follow-up via telephone encounter including billing of the encounter (yes/no): Yes  Persons present on the phone encounter, with roles: None  Total time (minutes) spent on medical discussion: 16 minutes  Chief Complaint  Patient presents with  . Crohn's Disease    occ diarrhea    HPI:   Felicia Pugh is a 73 y.o. female who presents for virtual visit regarding: Follow-up on Crohn's and diarrhea.  Patient was last seen in our office 06/25/2018 for Crohn's, chronic diarrhea, elevated alk phos.  Colonoscopy up-to-date 05/2017 with active ileitis otherwise normal colon.  Insurance previously would not approve Lialda but covered Kolesar with Bentyl for IBS overlay.  She has had intermittent isolated elevation of alkaline phosphatase, previous labs 12/29/2017 noted at 129.  Updated labs 03/30/2018 showed HFP completely normal including alkaline phosphatase and GGT also normal.  At her last visit she noted persistent, chronic diarrhea not relieved by Bentyl.  Pepto-Bismol helps and will decrease the number of her stools from 6 down to 2-3.  She states Imodium does work better but she has a full bottle of Pepto-Bismol and wants to finish that before purchasing Imodium.  Stools soft but not watery and typically stress triggers worsening symptoms.  Also some identified food triggers which she tries to avoid.  No other GI complaints.  Recommended labs including CBC, CMP, CRP, ESR.  Also recommended continue current medications, attempt patient assistance program for medications and follow-up in 6 months.  Labs completed 06/25/2018  found CBC essentially normal/stable, CMP normal (including alkaline phosphatase at 128), CRP and ESR also normal.  Today she states she's doing well overall. She's having occasional days of 4-5 stools, some days only once a day. Intermittent diarrhea. Denies hematochezia. She's on Iron and stools are dark. Energy level is good, although a lot of days "I push myself." She is using Imodium as needed. Stress is a big trigger for her. Denies any further abdominal pain. Denies fever, chills, unintentional weight loss. Denies rashes or oral sores. Denies URI and flu-like symptoms. Denies loss of sense of smell or taste. Denies chest pain, dyspnea, dizziness, lightheadedness, syncope, near syncope. Denies any other upper or lower GI symptoms.  Past Medical History:  Diagnosis Date  . 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 Cataract   . Complication of anesthesia    difficulty breathing after waking up from anesthesia  . Crohn's disease ([place])   . Diabetes mellitus without complication ([place])   . Diabetic neuropathy ([place])   . Hyperlipidemia   . Hypertension   . IBS (irritable bowel syndrome)   . Stroke [facility]) 2011  . Syncope and collapse   . Thyroid nodule   . Ulcer     Past Surgical History:  Procedure Laterality Date  . ABDOMINAL HYSTERECTOMY     partial  . BACK SURGERY     x3 (Dr Loyola Mast, Dr Patrice Paradise)  . BREAST SURGERY Right 2002   cyst removed  . COLONOSCOPY  10/102006   RMR: normal  . COLONOSCOPY  09/06/2009   RMR: Suboptimal prep on  the right side. Ulcers noted at cecum and in the descending colon. Terminal ileum could not be intubated. Ounces with chronic active colitis, question Crohn's  . COLONOSCOPY N/A 03/27/2015   Dr. Gala Romney: abnormal cecum and IC valve most consistent with IBD. TI intubated and appeared normal. Likely Crohn's disease. Patient denies NSAID use. No improvement with Lialda samples at time of initial diagnosis  .  COLONOSCOPY N/A 05/15/2017   Dr. Gala Romney: ab normal IC valve, s/p biopsy with active ileitis, diverticulosis in sigmoid and descending colon. Non-bleeding internal hemorrhoids  . ESOPHAGOGASTRODUODENOSCOPY    . LAPAROSCOPIC APPENDECTOMY N/A 07/21/2016   Procedure: APPENDECTOMY LAPAROSCOPIC;  Surgeon: Aviva Signs, MD;  Location: [facility];  Service: General;  Laterality: N/A;  . Small bowel capsule endoscopy  06/10/2005   RMR: single erosion versus AVM, distal ileum  . Small bowel capsule endoscopy  prior to 05/2005   [place]: reported erosions/ulcerations per medical record. actual report unavailable.    Current Outpatient Medications  Medication Sig Dispense Refill  . atorvastatin (LIPITOR) 40 MG tablet Take 1 tablet (40 mg total) by mouth daily. 90 tablet 3  . balsalazide (COLAZAL) 750 MG capsule Take 3 capsules (2,250 mg total) by mouth 3 (three) times daily. 810 capsule 2  . bismuth subsalicylate (PEPTO BISMOL) 262 MG/15ML suspension 1 tsp in the AM as needed    . BLACK CURRANT SEED OIL PO Take by mouth daily. 1 tsp po daily    . ferrous sulfate (IRON SUPPLEMENT) 325 (65 FE) MG tablet Take 325 mg by mouth daily.    . insulin NPH Human (NOVOLIN N RELION) 100 UNIT/ML injection INJECT 26 UNITS IN THE MORNING AND 15 UNITS AT NIGHT 30 mL 4  . Insulin Syringe-Needle U-100 31G X 1/4" 1 ML MISC 1 Syringe by Does not apply route 2 (two) times daily. 60 each 6  . linagliptin (TRADJENTA) 5 MG TABS tablet Take 1 tablet (5 mg total) by mouth daily. 90 tablet 3  . losartan-hydrochlorothiazide (HYZAAR) 100-25 MG tablet Take 1 tablet by mouth daily. 90 tablet 3  . ONETOUCH VERIO test strip USE 1 STRIP TO CHECK GLUCOSE TWICE DAILY  3  . therapeutic multivitamin-minerals (THERAGRAN-M) tablet Take 1 tablet by mouth daily.     No current facility-administered medications for this visit.     Allergies as of 12/23/2018 - Review Complete 12/23/2018  Allergen Reaction Noted  . Metformin and related  05/11/2014  .  Asa [aspirin]  11/04/2012  . Nsaids  11/04/2012  . Other Diarrhea 12/25/2014  . Tolmetin Other (See Comments) 11/04/2012    Family History  Problem Relation Age of Onset  . Hypertension Mother   . Stroke Mother   . Diabetes Father        w/ BKA  . Hypertension Father   . Asthma Father   . Cancer Sister   . Diabetes Sister        stomach  . Diabetes Brother   . Diabetes Sister   . Cancer Sister        breast  . Hypertension Sister   . Hypertension Daughter   . Hypertension Son   . Multiple sclerosis Son   . Colon cancer Neg Hx   . Inflammatory bowel disease Neg Hx     Social History   Socioeconomic History  . Marital status: Married    Spouse name: Not on file  . Number of children: 5  . Years of education: Not on file  . Highest education level:  Not on file  Occupational History  . Not on file  Social Needs  . Financial resource strain: Not on file  . Food insecurity:    Worry: Not on file    Inability: Not on file  . Transportation needs:    Medical: Not on file    Non-medical: Not on file  Tobacco Use  . Smoking status: Never Smoker  . Smokeless tobacco: Never Used  Substance and Sexual Activity  . Alcohol use: No  . Drug use: No  . Sexual activity: Never  Lifestyle  . Physical activity:    Days per week: Not on file    Minutes per session: Not on file  . Stress: Not on file  Relationships  . Social connections:    Talks on phone: Not on file    Gets together: Not on file    Attends religious service: Not on file    Active member of club or organization: Not on file    Attends meetings of clubs or organizations: Not on file    Relationship status: Not on file  Other Topics Concern  . Not on file  Social History Narrative  . Not on file    Review of Systems: General: Negative for anorexia, weight loss, fever, chills, fatigue, weakness. ENT: Negative for hoarseness, difficulty swallowing. CV: Negative for chest pain, angina, palpitations,  peripheral edema.  Respiratory: Negative for dyspnea at rest, cough, sputum, wheezing.  GI: See history of present illness. Derm: Negative for rash or itching.  Endo: Negative for unusual weight change.  Heme: Negative for bruising or bleeding. Allergy: Negative for rash or hives.  Physical Exam: Note: limited exam due to virtual visit General:   Alert and oriented. Pleasant and cooperative. Well-nourished and well-developed.  Head:  Normocephalic and atraumatic. Eyes:  Without icterus, sclera clear and conjunctiva pink.  Ears:  Normal auditory acuity. Skin:  Intact without facial significant lesions or rashes. Neurologic:  Alert and oriented x4;  grossly normal neurologically. Psych:  Alert and cooperative. Normal mood and affect. Heme/Lymph/Immune: No excessive bruising noted.

## 2018-12-23 NOTE — Assessment & Plan Note (Signed)
Chronic diarrhea felt to be IBS overlay onto Crohn's disease.  Her diarrhea has improved with Imodium.  Only has diarrhea about twice a week at this point.  When she has diarrhea she will have 3-5 stools a day.  Other days she will have 1 or 2 stools a day which are generally soft and pass easily.  Recommend she continue Imodium for now.  Call with any worsening symptoms.

## 2018-12-23 NOTE — Patient Instructions (Signed)
Your health issues we discussed today were:   Crohn's disease and diarrhea: 1. It seems, based on your description, that your Crohn's disease is well managed on your current medications 2. I know you have chronic diarrhea and it is likely irritable bowel syndrome in addition to Crohn's disease 3. Continue taking Imodium to help control your diarrhea 4. Let us know if you have any worsening diarrhea, weight loss, worsening energy, blood in your stools 5. Have your labs drawn when you are able to, it is not urgent/emergent to run right out and have been completed  Overall I recommend:  1. Continue your other current medications 2. Call us if you have any questions or concerns 3. Follow-up in 6 months.   Because of recent events of COVID-19 ("Coronavirus"), follow CDC recommendations:  1. Wash your hand frequently 2. Avoid touching your face 3. Stay away from people who are sick 4. If you have symptoms such as fever, cough, shortness of breath then call your healthcare provider for further guidance 5. If you are sick, STAY AT HOME unless otherwise directed by your healthcare provider. 6. Follow directions from state and national officials regarding staying safe    At [facility] we value your feedback. You may receive a survey about your visit today. Please share your experience as we strive to create trusting relationships with our patients to provide genuine, compassionate, quality care.  We appreciate your understanding and patience as we review any laboratory studies, imaging, and other diagnostic tests that are ordered as we care for you. Our office policy is 5 business days for review of these results, and any emergent or urgent results are addressed in a timely manner for your best interest. If you do not hear from our office in 1 week, please contact us.   We also encourage the use of MyChart, which contains your medical information for your review as well. If you  are not enrolled in this feature, an access code is on this after visit summary for your convenience. Thank you for allowing [place] to be involved in your care.  It was great to see you today!  I hope you have a great day!!

## 2018-12-24 NOTE — Progress Notes (Signed)
CC'D TO PCP °

## 2018-12-25 ENCOUNTER — Other Ambulatory Visit: Payer: Self-pay | Admitting: Family Medicine

## 2018-12-30 ENCOUNTER — Other Ambulatory Visit: Payer: Self-pay | Admitting: Family Medicine

## 2018-12-30 DIAGNOSIS — E119 Type 2 diabetes mellitus without complications: Secondary | ICD-10-CM | POA: Diagnosis not present

## 2018-12-30 DIAGNOSIS — K5090 Crohn's disease, unspecified, without complications: Secondary | ICD-10-CM | POA: Diagnosis not present

## 2018-12-30 DIAGNOSIS — E785 Hyperlipidemia, unspecified: Secondary | ICD-10-CM | POA: Diagnosis not present

## 2018-12-30 DIAGNOSIS — J309 Allergic rhinitis, unspecified: Secondary | ICD-10-CM | POA: Diagnosis not present

## 2018-12-30 DIAGNOSIS — I10 Essential (primary) hypertension: Secondary | ICD-10-CM | POA: Diagnosis not present

## 2018-12-30 DIAGNOSIS — M1990 Unspecified osteoarthritis, unspecified site: Secondary | ICD-10-CM | POA: Diagnosis not present

## 2018-12-30 DIAGNOSIS — E669 Obesity, unspecified: Secondary | ICD-10-CM | POA: Diagnosis not present

## 2018-12-30 DIAGNOSIS — G8929 Other chronic pain: Secondary | ICD-10-CM | POA: Diagnosis not present

## 2018-12-30 DIAGNOSIS — D649 Anemia, unspecified: Secondary | ICD-10-CM | POA: Diagnosis not present

## 2018-12-30 DIAGNOSIS — Z794 Long term (current) use of insulin: Secondary | ICD-10-CM | POA: Diagnosis not present

## 2018-12-31 ENCOUNTER — Telehealth: Payer: Self-pay | Admitting: Family Medicine

## 2018-12-31 MED ORDER — LOSARTAN POTASSIUM-HCTZ 100-25 MG PO TABS: 1.0000 | ORAL_TABLET | Freq: Every day | ORAL | 0 refills | Status: DC

## 2018-12-31 NOTE — Telephone Encounter (Signed)
Spoke with pt and advised 30 day supply with no refills sent to pharmacy and no further refill will be given till her routine follow up with PCP. She has appt 01/14/19 with Dr Dettinger and voiced understanding.

## 2019-01-13 ENCOUNTER — Other Ambulatory Visit: Payer: Self-pay

## 2019-01-14 ENCOUNTER — Ambulatory Visit (INDEPENDENT_AMBULATORY_CARE_PROVIDER_SITE_OTHER): Payer: Medicare HMO | Admitting: Family Medicine

## 2019-01-14 ENCOUNTER — Encounter: Payer: Self-pay | Admitting: Family Medicine

## 2019-01-14 DIAGNOSIS — I152 Hypertension secondary to endocrine disorders: Secondary | ICD-10-CM

## 2019-01-14 DIAGNOSIS — K219 Gastro-esophageal reflux disease without esophagitis: Secondary | ICD-10-CM

## 2019-01-14 DIAGNOSIS — Z794 Long term (current) use of insulin: Secondary | ICD-10-CM

## 2019-01-14 DIAGNOSIS — E1159 Type 2 diabetes mellitus with other circulatory complications: Secondary | ICD-10-CM

## 2019-01-14 DIAGNOSIS — I10 Essential (primary) hypertension: Secondary | ICD-10-CM

## 2019-01-14 DIAGNOSIS — E1169 Type 2 diabetes mellitus with other specified complication: Secondary | ICD-10-CM | POA: Diagnosis not present

## 2019-01-14 DIAGNOSIS — E785 Hyperlipidemia, unspecified: Secondary | ICD-10-CM | POA: Diagnosis not present

## 2019-01-14 MED ORDER — LOSARTAN POTASSIUM-HCTZ 100-25 MG PO TABS: 1.0000 | ORAL_TABLET | Freq: Every day | ORAL | 3 refills | Status: DC

## 2019-01-14 MED ORDER — INSULIN NPH (HUMAN) (ISOPHANE) 100 UNIT/ML ~~LOC~~ SUSP: SUBCUTANEOUS | 4 refills | Status: DC

## 2019-01-14 MED ORDER — BALSALAZIDE DISODIUM 750 MG PO CAPS: 2250.0000 mg | ORAL_CAPSULE | Freq: Three times a day (TID) | ORAL | 2 refills | Status: DC

## 2019-01-14 MED ORDER — LINAGLIPTIN 5 MG PO TABS: 5.0000 mg | ORAL_TABLET | Freq: Every day | ORAL | 3 refills | Status: DC

## 2019-01-14 MED ORDER — ATORVASTATIN CALCIUM 40 MG PO TABS: 40.0000 mg | ORAL_TABLET | Freq: Every day | ORAL | 3 refills | Status: DC

## 2019-01-14 NOTE — Progress Notes (Signed)
Virtual Visit via telephone Note  I connected with Felicia Pugh on 01/14/19 at 0923 by telephone and verified that I am speaking with the correct person using two identifiers. Felicia Pugh is currently located at home and no other people are currently with her during visit. The provider, Fransisca Kaufmann Dettinger, MD is located in their office at time of visit.  Call ended at [phone]  I discussed the limitations, risks, security and privacy concerns of performing an evaluation and management service by telephone and the availability of in person appointments. I also discussed with the patient that there may be a patient responsible charge related to this service. The patient expressed understanding and agreed to proceed.   History and Present Illness: Hyperlipidemia Patient is coming in for recheck of his hyperlipidemia. The patient is currently taking lipitor. They deny any issues with myalgias or history of liver damage from it. They deny any focal numbness or weakness or chest pain.   Type 2 diabetes mellitus Patient comes in today for recheck of his diabetes. Patient has been currently taking nph and bs 119, 134. Patient is currently on an ACE inhibitor/ARB. Patient has not seen an ophthalmologist this year. Patient denies any issues with their feet.   Hypertension Patient is currently on losartan-hydrochlorathiazide, and their blood pressure today is 123/69. Patient denies any lightheadedness or dizziness. Patient denies headaches, blurred vision, chest pains, shortness of breath, or weakness. Denies any side effects from medication and is content with current medication.   GERD Patient is currently on pepto bismol and balsalazide.  She denies any major symptoms or abdominal pain or belching or burping. She denies any blood in her stool or lightheadedness or dizziness.   No diagnosis found.  Outpatient Encounter Medications as of 01/14/2019  Medication Sig  . atorvastatin (LIPITOR) 40 MG  tablet Take 1 tablet (40 mg total) by mouth daily.  . balsalazide (COLAZAL) 750 MG capsule Take 3 capsules (2,250 mg total) by mouth 3 (three) times daily.  [name] bismuth subsalicylate (PEPTO BISMOL) 262 MG/15ML suspension 1 tsp in the AM as needed  . BLACK CURRANT SEED OIL PO Take by mouth daily. 1 tsp po daily  . ferrous sulfate (IRON SUPPLEMENT) 325 (65 FE) MG tablet Take 325 mg by mouth daily.  . insulin NPH Human (NOVOLIN N RELION) 100 UNIT/ML injection INJECT 26 UNITS IN THE MORNING AND 15 UNITS AT NIGHT  . Insulin Syringe-Needle U-100 31G X 1/4" 1 ML MISC 1 Syringe by Does not apply route 2 (two) times daily.  [name] linagliptin (TRADJENTA) 5 MG TABS tablet Take 1 tablet (5 mg total) by mouth daily.  [name] losartan-hydrochlorothiazide (HYZAAR) 100-25 MG tablet Take 1 tablet by mouth daily. No further refills will be given till you have your routine follow up  . ONETOUCH VERIO test strip USE 1 STRIP TO CHECK GLUCOSE TWICE DAILY  . therapeutic multivitamin-minerals (THERAGRAN-M) tablet Take 1 tablet by mouth daily.   No facility-administered encounter medications on file as of 01/14/2019.     Review of Systems  Constitutional: Negative for chills and fever.  Eyes: Negative for visual disturbance.  Respiratory: Negative for chest tightness and shortness of breath.   Cardiovascular: Negative for chest pain and leg swelling.  Musculoskeletal: Negative for back pain and gait problem.  Skin: Negative for rash.  Neurological: Negative for light-headedness and headaches.  Psychiatric/Behavioral: Negative for agitation and behavioral problems.  All other systems reviewed and are negative.   Observations/Objective: patient sounds comfortable  on the phone and in no acute distress  Assessment and Plan: Problem List Items Addressed This Visit      Cardiovascular and Mediastinum   Hypertension associated with diabetes ([place])   Relevant Medications   linagliptin (TRADJENTA) 5 MG TABS tablet    losartan-hydrochlorothiazide (HYZAAR) 100-25 MG tablet   insulin NPH Human (NOVOLIN N RELION) 100 UNIT/ML injection   atorvastatin (LIPITOR) 40 MG tablet     Digestive   GERD (gastroesophageal reflux disease)   Relevant Medications   balsalazide (COLAZAL) 750 MG capsule     Endocrine   Hyperlipidemia associated with type 2 diabetes mellitus (HCC)   Relevant Medications   linagliptin (TRADJENTA) 5 MG TABS tablet   losartan-hydrochlorothiazide (HYZAAR) 100-25 MG tablet   insulin NPH Human (NOVOLIN N RELION) 100 UNIT/ML injection   atorvastatin (LIPITOR) 40 MG tablet   Type 2 diabetes mellitus with other specified complication (HCC) - Primary   Relevant Medications   linagliptin (TRADJENTA) 5 MG TABS tablet   losartan-hydrochlorothiazide (HYZAAR) 100-25 MG tablet   insulin NPH Human (NOVOLIN N RELION) 100 UNIT/ML injection   atorvastatin (LIPITOR) 40 MG tablet       Follow Up Instructions:  Follow-up in 3 months, appointment made   I discussed the assessment and treatment plan with the patient. The patient was provided an opportunity to ask questions and all were answered. The patient agreed with the plan and demonstrated an understanding of the instructions.   The patient was advised to call back or seek an in-person evaluation if the symptoms worsen or if the condition fails to improve as anticipated.  The above assessment and management plan was discussed with the patient. The patient verbalized understanding of and has agreed to the management plan. Patient is aware to call the clinic if symptoms persist or worsen. Patient is aware when to return to the clinic for a follow-up visit. Patient educated on when it is appropriate to go to the emergency department.    I provided 13 minutes of non-face-to-face time during this encounter.    Worthy Rancher, MD

## 2019-01-19 DIAGNOSIS — G894 Chronic pain syndrome: Secondary | ICD-10-CM | POA: Diagnosis not present

## 2019-01-19 DIAGNOSIS — M545 Low back pain: Secondary | ICD-10-CM | POA: Diagnosis not present

## 2019-01-19 DIAGNOSIS — M542 Cervicalgia: Secondary | ICD-10-CM | POA: Diagnosis not present

## 2019-01-26 DIAGNOSIS — K50919 Crohn's disease, unspecified, with unspecified complications: Secondary | ICD-10-CM | POA: Diagnosis not present

## 2019-01-26 DIAGNOSIS — K529 Noninfective gastroenteritis and colitis, unspecified: Secondary | ICD-10-CM | POA: Diagnosis not present

## 2019-01-27 LAB — CBC WITH DIFFERENTIAL/PLATELET
Absolute Monocytes: 752 cells/uL (ref 200–950)
Basophils Absolute: 59 cells/uL (ref 0–200)
Basophils Relative: 0.6 %
Eosinophils Absolute: 178 cells/uL (ref 15–500)
Eosinophils Relative: 1.8 %
HCT: 34.6 % — ABNORMAL LOW (ref 35.0–45.0)
Hemoglobin: 11.4 g/dL — ABNORMAL LOW (ref 11.7–15.5)
Lymphs Abs: 2049 cells/uL (ref 850–3900)
MCH: 28.6 pg (ref 27.0–33.0)
MCHC: 32.9 g/dL (ref 32.0–36.0)
MCV: 86.9 fL (ref 80.0–100.0)
MPV: 9.9 fL (ref 7.5–12.5)
Monocytes Relative: 7.6 %
Neutro Abs: 6861 cells/uL (ref 1500–7800)
Neutrophils Relative %: 69.3 %
Platelets: 321 10*3/uL (ref 140–400)
RBC: 3.98 10*6/uL (ref 3.80–5.10)
RDW: 12.6 % (ref 11.0–15.0)
Total Lymphocyte: 20.7 %
WBC: 9.9 10*3/uL (ref 3.8–10.8)

## 2019-01-27 LAB — COMPREHENSIVE METABOLIC PANEL
AG Ratio: 1.5 (calc) (ref 1.0–2.5)
ALT: 12 U/L (ref 6–29)
AST: 15 U/L (ref 10–35)
Albumin: 4 g/dL (ref 3.6–5.1)
Alkaline phosphatase (APISO): 90 U/L (ref 37–153)
BUN: 18 mg/dL (ref 7–25)
CO2: 29 mmol/L (ref 20–32)
Calcium: 9.4 mg/dL (ref 8.6–10.4)
Chloride: 104 mmol/L (ref 98–110)
Creat: 0.77 mg/dL (ref 0.60–0.93)
Globulin: 2.7 g/dL (calc) (ref 1.9–3.7)
Glucose, Bld: 145 mg/dL — ABNORMAL HIGH (ref 65–139)
Potassium: 3.8 mmol/L (ref 3.5–5.3)
Sodium: 142 mmol/L (ref 135–146)
Total Bilirubin: 0.4 mg/dL (ref 0.2–1.2)
Total Protein: 6.7 g/dL (ref 6.1–8.1)

## 2019-02-08 ENCOUNTER — Telehealth: Payer: Self-pay | Admitting: Internal Medicine

## 2019-02-08 NOTE — Telephone Encounter (Signed)
PATIENT CALLED Friday AND LEFT MESSAGE THAT SHE NEEDED TO SPEAK TO SOMEONE ABOUT HER MEDICATION  PLEASE CALL

## 2019-02-08 NOTE — Telephone Encounter (Signed)
Colazol is $175.00. pt would like our office to call her insurance company to see if the tier can be lowered. Will call pt's insurance company.   EG- pt is inquiring about her lab work. Pt takes iron 25 mg daily and wonders if that dosage is ok to take or should you take higher dossags of iron when it's otc.?

## 2019-02-09 NOTE — Telephone Encounter (Signed)
Called pt to get the provider line number from her insurance card, per pt that number is [phone].

## 2019-02-15 NOTE — Telephone Encounter (Signed)
Spoke with pts insurance company this morning. pts medication has been reduced from a tier 3 to a tier 1. They will mail pt a copy of her tier reduction. [employer] wouldn't give the cost of the medication. Spoke with pt, she is aware of the tier reduction and will contact her pharmacy about the cost.

## 2019-04-12 ENCOUNTER — Telehealth: Payer: Self-pay | Admitting: Family Medicine

## 2019-04-12 NOTE — Telephone Encounter (Signed)
[facility] [place], [place] [address] [phone]  Unfortunately there is no equivalent that would necessarily be cheaper, here is the number for her to call for prescription assistance and a lot of times they can get it much cheaper for her.  The other medications in this class are Januvia and Onglyza and they would both cost very similar.

## 2019-04-12 NOTE — Telephone Encounter (Signed)
Aware.  She needs to call for assistance to get medications.

## 2019-04-12 NOTE — Telephone Encounter (Signed)
No samples 

## 2019-04-21 ENCOUNTER — Other Ambulatory Visit: Payer: Self-pay | Admitting: Family Medicine

## 2019-04-21 DIAGNOSIS — E1169 Type 2 diabetes mellitus with other specified complication: Secondary | ICD-10-CM

## 2019-04-22 ENCOUNTER — Telehealth: Payer: Self-pay | Admitting: Internal Medicine

## 2019-04-22 ENCOUNTER — Ambulatory Visit (INDEPENDENT_AMBULATORY_CARE_PROVIDER_SITE_OTHER): Payer: Medicare HMO | Admitting: Family Medicine

## 2019-04-22 ENCOUNTER — Encounter: Payer: Self-pay | Admitting: Family Medicine

## 2019-04-22 DIAGNOSIS — I10 Essential (primary) hypertension: Secondary | ICD-10-CM

## 2019-04-22 DIAGNOSIS — E1169 Type 2 diabetes mellitus with other specified complication: Secondary | ICD-10-CM | POA: Diagnosis not present

## 2019-04-22 DIAGNOSIS — I152 Hypertension secondary to endocrine disorders: Secondary | ICD-10-CM

## 2019-04-22 DIAGNOSIS — Z794 Long term (current) use of insulin: Secondary | ICD-10-CM | POA: Diagnosis not present

## 2019-04-22 DIAGNOSIS — E1159 Type 2 diabetes mellitus with other circulatory complications: Secondary | ICD-10-CM | POA: Diagnosis not present

## 2019-04-22 DIAGNOSIS — E785 Hyperlipidemia, unspecified: Secondary | ICD-10-CM | POA: Diagnosis not present

## 2019-04-22 MED ORDER — INSULIN GLARGINE 100 UNIT/ML ~~LOC~~ SOLN: 20.0000 [IU] | Freq: Every day | SUBCUTANEOUS | 11 refills | Status: DC

## 2019-04-22 NOTE — Progress Notes (Signed)
Virtual Visit via telephone Note  I connected with Felicia Pugh on 04/22/19 at 1004 by telephone and verified that I am speaking with the correct person using two identifiers. Felicia Pugh is currently located at home and no other people are currently with her during visit. The provider, Fransisca Kaufmann Dettinger, MD is located in their office at time of visit.  Call ended at 72  I discussed the limitations, risks, security and privacy concerns of performing an evaluation and management service by telephone and the availability of in person appointments. I also discussed with the patient that there may be a patient responsible charge related to this service. The patient expressed understanding and agreed to proceed.   History and Present Illness: Type 2 diabetes mellitus Patient comes in today for recheck of his diabetes. Patient has been currently taking tradjenta and novilin 22 in am and 20 at night and bs is 122-197 in htn in the am. Patient is currently on an ACE inhibitor/ARB. Patient has not seen an ophthalmologist this year. Patient denies any issues with their feet.   Hypertension Patient is currently on losartan-hctz, and their blood pressure today is 131/69. Patient denies any lightheadedness or dizziness. Patient denies headaches, blurred vision, chest pains, shortness of breath, or weakness. Denies any side effects from medication and is content with current medication.   Hyperlipidemia Patient is coming in for recheck of his hyperlipidemia. The patient is currently taking lipitor. They deny any issues with myalgias or history of liver damage from it. They deny any focal numbness or weakness or chest pain.   No diagnosis found.  Outpatient Encounter Medications as of 04/22/2019  Medication Sig  . atorvastatin (LIPITOR) 40 MG tablet Take 1 tablet (40 mg total) by mouth daily.  . balsalazide (COLAZAL) 750 MG capsule Take 3 capsules (2,250 mg total) by mouth 3 (three) times daily.   [name] bismuth subsalicylate (PEPTO BISMOL) 262 MG/15ML suspension 1 tsp in the AM as needed  . BLACK CURRANT SEED OIL PO Take by mouth daily. 1 tsp po daily  . ferrous sulfate (IRON SUPPLEMENT) 325 (65 FE) MG tablet Take 325 mg by mouth daily.  . Insulin Syringe-Needle U-100 31G X 1/4" 1 ML MISC 1 Syringe by Does not apply route 2 (two) times daily.  [name] linagliptin (TRADJENTA) 5 MG TABS tablet Take 1 tablet (5 mg total) by mouth daily.  [name] losartan-hydrochlorothiazide (HYZAAR) 100-25 MG tablet Take 1 tablet by mouth daily. No further refills will be given till you have your routine follow up  . NOVOLIN N RELION 100 UNIT/ML injection INJECT 26 UNITS IN THE MORNING AND 15 UNITS AT NIGHT  . ONETOUCH VERIO test strip USE 1 STRIP TO CHECK GLUCOSE TWICE DAILY  . therapeutic multivitamin-minerals (THERAGRAN-M) tablet Take 1 tablet by mouth daily.   No facility-administered encounter medications on file as of 04/22/2019.     Review of Systems  Constitutional: Negative for chills and fever.  Eyes: Negative for visual disturbance.  Respiratory: Negative for chest tightness and shortness of breath.   Cardiovascular: Negative for chest pain and leg swelling.  Musculoskeletal: Negative for back pain and gait problem.  Skin: Negative for rash.  Neurological: Negative for dizziness, weakness, light-headedness and headaches.  Psychiatric/Behavioral: Negative for agitation, behavioral problems, self-injury, sleep disturbance and suicidal ideas. The patient is not nervous/anxious.   All other systems reviewed and are negative.   Observations/Objective: Patient sounds comfortable   Assessment and Plan: Problem List Items Addressed This Visit  Cardiovascular and Mediastinum   Hypertension associated with diabetes ([place])   Relevant Medications   insulin glargine (LANTUS) 100 UNIT/ML injection   Other Relevant Orders   CBC with Differential/Platelet     Endocrine   Hyperlipidemia associated with type 2  diabetes mellitus (HCC)   Relevant Medications   insulin glargine (LANTUS) 100 UNIT/ML injection   Other Relevant Orders   Lipid panel   Type 2 diabetes mellitus with other specified complication (HCC) - Primary   Relevant Medications   insulin glargine (LANTUS) 100 UNIT/ML injection   Other Relevant Orders   CMP14+EGFR   Bayer DCA Hb A1c Waived       Follow Up Instructions: Follow up in 3 months for diabetes and htn  Patient desires to change the #1 because she is not having good control of her sugars and her husband does better with the Lantus and she like to try the Lantus, or agreeable towards this and she will start 20 units and then every 3 days if she is not under control will increase by 2 more units until she gets it under control.  We also discussed Victoza for weight loss and she is going to discuss it with her gastroenterologist and if he says it is okay then we will prescribe that for her.   I discussed the assessment and treatment plan with the patient. The patient was provided an opportunity to ask questions and all were answered. The patient agreed with the plan and demonstrated an understanding of the instructions.   The patient was advised to call back or seek an in-person evaluation if the symptoms worsen or if the condition fails to improve as anticipated.  The above assessment and management plan was discussed with the patient. The patient verbalized understanding of and has agreed to the management plan. Patient is aware to call the clinic if symptoms persist or worsen. Patient is aware when to return to the clinic for a follow-up visit. Patient educated on when it is appropriate to go to the emergency department.    I provided 18 minutes of non-face-to-face time during this encounter.    Worthy Rancher, MD

## 2019-04-22 NOTE — Telephone Encounter (Signed)
Pt called to ask if it was ok to take Victoza? Pt's medical doctor wanted her to check with the gastro doctor prior to prescribing due to pt having chron's. Please advise.

## 2019-04-22 NOTE — Telephone Encounter (Signed)
Noted. Pt notified of EG's education for Victoza and Colazal. Pt will discuss further with her PCP.

## 2019-04-22 NOTE — Telephone Encounter (Signed)
There is no interaction with Victoza and Colazal (her Crohn's medication). There's no contraindications found related to Crohn's. However, keep in mind Victoza can result in diarrhea in up to 23% of patients so she will need to be aware of this to guide thinking about her Crohn's and if diarrhea is the medication or a flare.  Call if any questions

## 2019-04-22 NOTE — Telephone Encounter (Signed)
Pt has questions about her medications. Please call [phone]

## 2019-04-23 ENCOUNTER — Telehealth: Payer: Self-pay | Admitting: *Deleted

## 2019-04-23 MED ORDER — BASAGLAR KWIKPEN 100 UNIT/ML ~~LOC~~ SOPN: 20.0000 [IU] | PEN_INJECTOR | Freq: Every day | SUBCUTANEOUS | 3 refills | Status: DC

## 2019-04-23 NOTE — Telephone Encounter (Signed)
Patient aware of medication change

## 2019-04-23 NOTE — Addendum Note (Signed)
Addended by: Caryl Pina on: 04/23/2019 03:01 PM   Modules accepted: Orders

## 2019-04-23 NOTE — Telephone Encounter (Signed)
Fax from [facility] for Lantus vial 100u/ml 20-30 u QD Insurance prefers basaglar, Tyler Aas or Levemir Please advise

## 2019-04-23 NOTE — Telephone Encounter (Signed)
Please let patient know that Lantus was not covered so I sent Basaglar which is essentially the same thing just a different brand.

## 2019-04-24 DIAGNOSIS — R69 Illness, unspecified: Secondary | ICD-10-CM | POA: Diagnosis not present

## 2019-04-26 ENCOUNTER — Other Ambulatory Visit: Payer: Self-pay

## 2019-04-26 ENCOUNTER — Other Ambulatory Visit: Payer: Medicare HMO

## 2019-04-26 DIAGNOSIS — E1169 Type 2 diabetes mellitus with other specified complication: Secondary | ICD-10-CM

## 2019-04-26 DIAGNOSIS — Z794 Long term (current) use of insulin: Secondary | ICD-10-CM

## 2019-04-26 DIAGNOSIS — E785 Hyperlipidemia, unspecified: Secondary | ICD-10-CM | POA: Diagnosis not present

## 2019-04-26 DIAGNOSIS — E1159 Type 2 diabetes mellitus with other circulatory complications: Secondary | ICD-10-CM

## 2019-04-26 DIAGNOSIS — I152 Hypertension secondary to endocrine disorders: Secondary | ICD-10-CM

## 2019-04-26 DIAGNOSIS — I10 Essential (primary) hypertension: Secondary | ICD-10-CM | POA: Diagnosis not present

## 2019-04-26 LAB — BAYER DCA HB A1C WAIVED: HB A1C (BAYER DCA - WAIVED): 8.7 % — ABNORMAL HIGH (ref <7.0)

## 2019-04-27 LAB — CMP14+EGFR
ALT: 12 IU/L (ref 0–32)
AST: 17 IU/L (ref 0–40)
Albumin/Globulin Ratio: 2 (ref 1.2–2.2)
Albumin: 4.4 g/dL (ref 3.7–4.7)
Alkaline Phosphatase: 101 IU/L (ref 39–117)
BUN/Creatinine Ratio: 21 (ref 12–28)
BUN: 16 mg/dL (ref 8–27)
Bilirubin Total: 0.3 mg/dL (ref 0.0–1.2)
CO2: 28 mmol/L (ref 20–29)
Calcium: 9.3 mg/dL (ref 8.7–10.3)
Chloride: 106 mmol/L (ref 96–106)
Creatinine, Ser: 0.78 mg/dL (ref 0.57–1.00)
GFR calc Af Amer: 87 mL/min/{1.73_m2} (ref >59)
GFR calc non Af Amer: 76 mL/min/{1.73_m2} (ref >59)
Globulin, Total: 2.2 g/dL (ref 1.5–4.5)
Glucose: 93 mg/dL (ref 65–99)
Potassium: 3.9 mmol/L (ref 3.5–5.2)
Sodium: 145 mmol/L — ABNORMAL HIGH (ref 134–144)
Total Protein: 6.6 g/dL (ref 6.0–8.5)

## 2019-04-27 LAB — CBC WITH DIFFERENTIAL/PLATELET
Basophils Absolute: 0.1 10*3/uL (ref 0.0–0.2)
Basos: 1 %
EOS (ABSOLUTE): 0.2 10*3/uL (ref 0.0–0.4)
Eos: 2 %
Hematocrit: 35.9 % (ref 34.0–46.6)
Hemoglobin: 11.3 g/dL (ref 11.1–15.9)
Immature Grans (Abs): 0 10*3/uL (ref 0.0–0.1)
Immature Granulocytes: 0 %
Lymphocytes Absolute: 1.6 10*3/uL (ref 0.7–3.1)
Lymphs: 21 %
MCH: 27.7 pg (ref 26.6–33.0)
MCHC: 31.5 g/dL (ref 31.5–35.7)
MCV: 88 fL (ref 79–97)
Monocytes Absolute: 0.7 10*3/uL (ref 0.1–0.9)
Monocytes: 9 %
Neutrophils Absolute: 5.1 10*3/uL (ref 1.4–7.0)
Neutrophils: 67 %
Platelets: 323 10*3/uL (ref 150–450)
RBC: 4.08 x10E6/uL (ref 3.77–5.28)
RDW: 13.1 % (ref 11.7–15.4)
WBC: 7.7 10*3/uL (ref 3.4–10.8)

## 2019-04-27 LAB — LIPID PANEL
Chol/HDL Ratio: 1.8 ratio (ref 0.0–4.4)
Cholesterol, Total: 152 mg/dL (ref 100–199)
HDL: 83 mg/dL (ref >39)
LDL Chol Calc (NIH): 58 mg/dL (ref 0–99)
Triglycerides: 53 mg/dL (ref 0–149)
VLDL Cholesterol Cal: 11 mg/dL (ref 5–40)

## 2019-04-29 ENCOUNTER — Telehealth: Payer: Self-pay | Admitting: Family Medicine

## 2019-04-29 NOTE — Telephone Encounter (Signed)
Aware of lab results

## 2019-05-06 ENCOUNTER — Telehealth: Payer: Self-pay | Admitting: Family Medicine

## 2019-05-06 NOTE — Telephone Encounter (Signed)
Her increase it by 4 and take 30 units

## 2019-05-06 NOTE — Telephone Encounter (Signed)
Patient states that she is on 26 units- she has increased it herself since it has been running high.  Do you want her to increase it another 5?

## 2019-05-06 NOTE — Telephone Encounter (Signed)
Patient aware and verbalizes understanding. 

## 2019-05-06 NOTE — Telephone Encounter (Signed)
Have her increase her Basaglar by 5 units, I think she is taking 20 units a day currently, have her go up by 5 units

## 2019-05-07 ENCOUNTER — Other Ambulatory Visit: Payer: Self-pay | Admitting: Family Medicine

## 2019-05-07 DIAGNOSIS — R69 Illness, unspecified: Secondary | ICD-10-CM | POA: Diagnosis not present

## 2019-05-21 ENCOUNTER — Telehealth: Payer: Self-pay | Admitting: Family Medicine

## 2019-05-21 NOTE — Telephone Encounter (Signed)
Yes increase Basaglar by another 4 units and then give it another week and let us know.

## 2019-05-21 NOTE — Telephone Encounter (Signed)
Pt advised of MD feedback and voiced understanding.

## 2019-05-24 ENCOUNTER — Ambulatory Visit (INDEPENDENT_AMBULATORY_CARE_PROVIDER_SITE_OTHER): Payer: Medicare HMO | Admitting: *Deleted

## 2019-05-24 DIAGNOSIS — Z0000 Encounter for general adult medical examination without abnormal findings: Secondary | ICD-10-CM

## 2019-05-24 NOTE — Progress Notes (Signed)
MEDICARE ANNUAL WELLNESS VISIT  05/24/2019  Telephone Visit Disclaimer This Medicare AWV was conducted by telephone due to national recommendations for restrictions regarding the COVID-19 Pandemic (e.g. social distancing).  I verified, using two identifiers, that I am speaking with Felicia Pugh or their authorized healthcare agent. I discussed the limitations, risks, security, and privacy concerns of performing an evaluation and management service by telephone and the potential availability of an in-person appointment in the future. The patient expressed understanding and agreed to proceed.   Subjective:  Felicia Pugh is a 73 y.o. female patient of Dettinger, Fransisca Kaufmann, MD who had a Medicare Annual Wellness Visit today via telephone. [name] is Retired and lives with their spouse. she has 5 children. she reports that she is socially active and does interact with friends/family regularly. she is moderately physically active and enjoys cooking, baking and staying active chasing around her 59 month old [place].  Patient Care Team: Dettinger, Fransisca Kaufmann, MD as PCP - General (Family Medicine) Gala Romney, Cristopher Estimable, MD as Consulting Physician (Gastroenterology) Starling Manns, MD as Consulting Physician (Orthopedic Surgery) Annitta Needs, NP (Gastroenterology)  Advanced Directives 05/24/2019 05/19/2018 05/15/2017 05/01/2017 03/17/2017 07/21/2016 07/20/2016  Does Patient Have a Medical Advance Directive? Yes Yes No No No No No  Type of Advance Directive Healthcare Power of [place]  Does patient want to make changes to medical advance directive? No - Patient declined Yes (MAU/Ambulatory/Procedural Areas - Information given) - - - - -  Copy of [place] in Chart? No - copy requested No - copy requested - - - - -  Would patient like information on creating a medical advance directive? - - No - Patient declined Yes (MAU/Ambulatory/Procedural Areas -  Information given) No - Patient declined No - Patient declined -    Hospital Utilization Over the Past 12 Months: # of hospitalizations or ER visits: 0 # of surgeries: 0  Review of Systems    Patient reports that her overall health is better compared to last year.  History obtained from chart review  Patient Reported Readings (BP, Pulse, CBG, Weight, etc) none  Pain Assessment Pain : No/denies pain     Current Medications & Allergies (verified) Allergies as of 05/24/2019      Reactions   Metformin And Related    Severe diarrhea   Asa [aspirin]    GI Dr does not want her to take due to hx ulcers   Nsaids    GI Dr does not want her to take due to hx of ulcers   Other Diarrhea   Steroids   Tolmetin Other (See Comments)   GI Dr does not want her to take due to hx of ulcers GI Dr does not want her to take due to hx of ulcers      Medication List       Accurate as of May 24, 2019 10:56 AM. If you have any questions, ask your nurse or doctor.        STOP taking these medications   BLACK CURRANT SEED OIL PO     TAKE these medications   atorvastatin 40 MG tablet Commonly known as: LIPITOR Take 1 tablet (40 mg total) by mouth daily.   balsalazide 750 MG capsule Commonly known as: COLAZAL Take 3 capsules (2,250 mg total) by mouth 3 (three) times daily.   Basaglar KwikPen 100 UNIT/ML Sopn Inject 0.2-0.3 mLs (20-30  Units total) into the skin daily.   bismuth subsalicylate 683 MH/96QI suspension Commonly known as: PEPTO BISMOL 1 tsp in the AM as needed   Insulin Syringe-Needle U-100 31G X 1/4" 1 ML Misc 1 Syringe by Does not apply route 2 (two) times daily.   Iron Supplement 325 (65 FE) MG tablet Generic drug: ferrous sulfate Take 325 mg by mouth daily.   linagliptin 5 MG Tabs tablet Commonly known as: TRADJENTA Take 1 tablet (5 mg total) by mouth daily.   losartan-hydrochlorothiazide 100-25 MG tablet Commonly known as: HYZAAR Take 1 tablet by mouth  daily. No further refills will be given till you have your routine follow up   OneTouch Verio test strip Generic drug: glucose blood USE 1 STRIP TO CHECK GLUCOSE TWICE DAILY   therapeutic multivitamin-minerals tablet Take 1 tablet by mouth daily.       History (reviewed): Past Medical History:  Diagnosis Date  . 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 Cataract   . Complication of anesthesia    difficulty breathing after waking up from anesthesia  . Crohn's disease ([place])   . Diabetes mellitus without complication ([place])   . Diabetic neuropathy ([place])   . Hyperlipidemia   . Hypertension   . IBS (irritable bowel syndrome)   . Stroke [facility]) 2011  . Syncope and collapse   . Thyroid nodule   . Ulcer    Past Surgical History:  Procedure Laterality Date  . ABDOMINAL HYSTERECTOMY     partial  . BACK SURGERY     x3 (Dr Loyola Mast, Dr Patrice Paradise)  . BREAST SURGERY Right 2002   cyst removed  . COLONOSCOPY  10/102006   RMR: normal  . COLONOSCOPY  09/06/2009   RMR: Suboptimal prep on the right side. Ulcers noted at cecum and in the descending colon. Terminal ileum could not be intubated. Ounces with chronic active colitis, question Crohn's  . COLONOSCOPY N/A 03/27/2015   Dr. Gala Romney: abnormal cecum and IC valve most consistent with IBD. TI intubated and appeared normal. Likely Crohn's disease. Patient denies NSAID use. No improvement with Lialda samples at time of initial diagnosis  . COLONOSCOPY N/A 05/15/2017   Dr. Gala Romney: ab normal IC valve, s/p biopsy with active ileitis, diverticulosis in sigmoid and descending colon. Non-bleeding internal hemorrhoids  . ESOPHAGOGASTRODUODENOSCOPY    . LAPAROSCOPIC APPENDECTOMY N/A 07/21/2016   Procedure: APPENDECTOMY LAPAROSCOPIC;  Surgeon: Aviva Signs, MD;  Location: [facility];  Service: General;  Laterality: N/A;  . Small bowel capsule endoscopy  06/10/2005   RMR: single erosion versus AVM, distal  ileum  . Small bowel capsule endoscopy  prior to 05/2005   [place]: reported erosions/ulcerations per medical record. actual report unavailable.   Family History  Problem Relation Age of Onset  . Hypertension Mother   . Stroke Mother   . Diabetes Father        w/ BKA  . Hypertension Father   . Asthma Father   . Cancer Sister   . Diabetes Sister        stomach  . Diabetes Brother   . Diabetes Sister   . Cancer Sister        breast  . Hypertension Sister   . Hypertension Daughter   . Hypertension Son   . Multiple sclerosis Son   . Colon cancer Neg Hx   . Inflammatory bowel disease Neg Hx    Social History   Socioeconomic  History  . Marital status: Married    Spouse name: Herbie Baltimore  . Number of children: 5  . Years of education: 9th grade-then got her GED at [employer]  . Highest education level: GED or equivalent  Occupational History  . Occupation: retired  Scientific laboratory technician  . Financial resource strain: Not hard at all  . Food insecurity    Worry: Never true    Inability: Never true  . Transportation needs    Medical: No    Non-medical: No  Tobacco Use  . Smoking status: Never Smoker  . Smokeless tobacco: Never Used  Substance and Sexual Activity  . Alcohol use: No  . Drug use: No  . Sexual activity: Not Currently  Lifestyle  . Physical activity    Days per week: 0 days    Minutes per session: 0 min  . Stress: Not at all  Relationships  . Social connections    Talks on phone: More than three times a week    Gets together: More than three times a week    Attends religious service: More than 4 times per year    Active member of club or organization: Yes    Attends meetings of clubs or organizations: More than 4 times per year    Relationship status: Married  Other Topics Concern  . Not on file  Social History Narrative  . Not on file    Activities of Daily Living In your present state of health, do you have any difficulty performing the following activities:  05/24/2019  Hearing? Y  Comment ringing in right ear since stroke in 2011  Vision? N  Comment wears glasses-gets yearly eye exams  Difficulty concentrating or making decisions? N  Walking or climbing stairs? N  Dressing or bathing? N  Doing errands, shopping? N  Preparing Food and eating ? N  Using the Toilet? N  In the past six months, have you accidently leaked urine? N  Do you have problems with loss of bowel control? N  Managing your Medications? N  Managing your Finances? N  Housekeeping or managing your Housekeeping? N  Some recent data might be hidden    Patient Education/ Literacy How often do you need to have someone help you when you read instructions, pamphlets, or other written materials from your doctor or pharmacy?: 1 - Never What is the last grade level you completed in school?: 9th grade-went back and got her GED at [employer]  Exercise Current Exercise Habits: The patient does not participate in regular exercise at present, Exercise limited by: orthopedic condition(s)  Diet Patient reports consuming 3 meals a day and 2 snack(s) a day Patient reports that her primary diet is: Regular Patient reports that she does have regular access to food.   Depression Screen PHQ 2/9 Scores 05/24/2019 10/12/2018 07/10/2018 05/19/2018 04/09/2018 12/29/2017 11/27/2017  PHQ - 2 Score 0 0 0 0 0 0 0     Fall Risk Fall Risk  05/24/2019 07/10/2018 05/19/2018 04/09/2018 12/29/2017  Falls in the past year? 1 0 No No No  Number falls in past yr: 0 0 - - -  Injury with Fall? 0 - - - -  Follow up Falls prevention discussed - - - -  Comment Get rid of all throw rugs in the house, adequate lighting in the walkways and grab bars in the bathroom - - - -     Objective:  Felicia Pugh seemed alert and oriented and she participated appropriately during our  telephone visit.  Blood Pressure Weight BMI  BP Readings from Last 3 Encounters:  10/12/18 118/67  07/10/18 124/74  06/25/18 135/72   Wt  Readings from Last 3 Encounters:  10/12/18 180 lb 9.6 oz (81.9 kg)  07/10/18 176 lb 6.4 oz (80 kg)  06/25/18 175 lb 6.4 oz (79.6 kg)   BMI Readings from Last 1 Encounters:  10/12/18 31.99 kg/m    *Unable to obtain current vital signs, weight, and BMI due to telephone visit type  Hearing/Vision  . Shavell did not seem to have difficulty with hearing/understanding during the telephone conversation . Reports that she has not had a formal eye exam by an eye care professional within the past year . Reports that she has not had a formal hearing evaluation within the past year *Unable to fully assess hearing and vision during telephone visit type  Cognitive Function: 6CIT Screen 05/24/2019  What Year? 0 points  What month? 0 points  What time? 0 points  Count back from 20 0 points  Months in reverse 0 points  Repeat phrase 0 points  Total Score 0   (Normal:0-7, Significant for Dysfunction: >8)  Normal Cognitive Function Screening: Yes   Immunization & Health Maintenance Record Immunization History  Administered Date(s) Administered  . Influenza, High Dose Seasonal PF 05/19/2018, 04/24/2019  . Influenza,inj,Quad PF,6+ Mos 05/13/2013, 05/27/2014, 06/22/2015, 06/11/2016, 06/26/2017  . Pneumococcal Conjugate-13 07/08/2014  . Pneumococcal Polysaccharide-23 12/16/2016  . Tdap 12/30/2017    Health Maintenance  Topic Date Due  . OPHTHALMOLOGY EXAM  10/08/2018  . FOOT EXAM  12/30/2018  . HEMOGLOBIN A1C  10/24/2019  . MAMMOGRAM  03/09/2020  . COLONOSCOPY  05/16/2027  . TETANUS/TDAP  12/31/2027  . INFLUENZA VACCINE  Completed  . DEXA SCAN  Completed  . Hepatitis C Screening  Completed  . PNA vac Low Risk Adult  Completed       Assessment  This is a routine wellness examination for [place] Maintenance: Due or Overdue Health Maintenance Due  Topic Date Due  . OPHTHALMOLOGY EXAM  10/08/2018  . FOOT EXAM  12/30/2018    Felicia Pugh does not need a referral  for Community Assistance: Care Management:   no Social Work:    no Prescription Assistance:  no Nutrition/Diabetes Education:  no   Plan:  Personalized Goals Goals Addressed            This Visit's Progress   . DIET - INCREASE WATER INTAKE       Try to drink 6-8 glasses of water daily.      Personalized Health Maintenance & Screening Recommendations  Advanced directives: has an advanced directive - a copy HAS NOT been provided. Shingles vaccine and Diabetic Eye Exam  Lung Cancer Screening Recommended: no (Low Dose CT Chest recommended if Age 61-80 years, 30 pack-year currently smoking OR have quit w/in past 15 years) Hepatitis C Screening recommended: no HIV Screening recommended: no  Advanced Directives: Written information was not prepared per patient's request.  Referrals & Orders No orders of the defined types were placed in this encounter.   Follow-up Plan . Follow-up with Dettinger, Fransisca Kaufmann, MD as planned . Schedule your Diabetic Eye Exam as discussed . Consider Shingles vaccine at your next visit with your PCP . Bring a copy of your Advanced Directive in for our records   I have personally reviewed and noted the following in the patient's chart:   . Medical and social history . Use  of alcohol, tobacco or illicit drugs  . Current medications and supplements . Functional ability and status . Nutritional status . Physical activity . Advanced directives . List of other physicians . Hospitalizations, surgeries, and ER visits in previous 12 months . Vitals . Screenings to include cognitive, depression, and falls . Referrals and appointments  In addition, I have reviewed and discussed with Felicia Pugh certain preventive protocols, quality metrics, and best practice recommendations. A written personalized care plan for preventive services as well as general preventive health recommendations is available and can be mailed to the patient at her request.       Milas Hock, LPN  [phone]

## 2019-05-24 NOTE — Patient Instructions (Signed)
Preventive Care 38 Years and Older, Female Preventive care refers to lifestyle choices and visits with your health care provider that can promote health and wellness. This includes:  A yearly physical exam. This is also called an annual well check.  Regular dental and eye exams.  Immunizations.  Screening for certain conditions.  Healthy lifestyle choices, such as diet and exercise. What can I expect for my preventive care visit? Physical exam Your health care provider will check:  Height and weight. These may be used to calculate body mass index (BMI), which is a measurement that tells if you are at a healthy weight.  Heart rate and blood pressure.  Your skin for abnormal spots. Counseling Your health care provider may ask you questions about:  Alcohol, tobacco, and drug use.  Emotional well-being.  Home and relationship well-being.  Sexual activity.  Eating habits.  History of falls.  Memory and ability to understand (cognition).  Work and work Statistician.  Pregnancy and menstrual history. What immunizations do I need?  Influenza (flu) vaccine  This is recommended every year. Tetanus, diphtheria, and pertussis (Tdap) vaccine  You may need a Td booster every 10 years. Varicella (chickenpox) vaccine  You may need this vaccine if you have not already been vaccinated. Zoster (shingles) vaccine  You may need this after age 73. Pneumococcal conjugate (PCV13) vaccine  One dose is recommended after age 73. Pneumococcal polysaccharide (PPSV23) vaccine  One dose is recommended after age 73. Measles, mumps, and rubella (MMR) vaccine  You may need at least one dose of MMR if you were born in 1957 or later. You may also need a second dose. Meningococcal conjugate (MenACWY) vaccine  You may need this if you have certain conditions. Hepatitis A vaccine  You may need this if you have certain conditions or if you travel or work in places where you may be exposed  to hepatitis A. Hepatitis B vaccine  You may need this if you have certain conditions or if you travel or work in places where you may be exposed to hepatitis B. Haemophilus influenzae type b (Hib) vaccine  You may need this if you have certain conditions. You may receive vaccines as individual doses or as more than one vaccine together in one shot (combination vaccines). Talk with your health care provider about the risks and benefits of combination vaccines. What tests do I need? Blood tests  Lipid and cholesterol levels. These may be checked every 5 years, or more frequently depending on your overall health.  Hepatitis C test.  Hepatitis B test. Screening  Lung cancer screening. You may have this screening every year starting at age 73 if you have a 30-pack-year history of smoking and currently smoke or have quit within the past 15 years.  Colorectal cancer screening. All adults should have this screening starting at age 73 and continuing until age 15. Your health care provider may recommend screening at age 23 if you are at increased risk. You will have tests every 1-10 years, depending on your results and the type of screening test.  Diabetes screening. This is done by checking your blood sugar (glucose) after you have not eaten for a while (fasting). You may have this done every 1-3 years.  Mammogram. This may be done every 1-2 years. Talk with your health care provider about how often you should have regular mammograms.  BRCA-related cancer screening. This may be done if you have a family history of breast, ovarian, tubal, or peritoneal cancers.  Other tests  Sexually transmitted disease (STD) testing.  Bone density scan. This is done to screen for osteoporosis. You may have this done starting at age 73. Follow these instructions at home: Eating and drinking  Eat a diet that includes fresh fruits and vegetables, whole grains, lean protein, and low-fat dairy products. Limit  your intake of foods with high amounts of sugar, saturated fats, and salt.  Take vitamin and mineral supplements as recommended by your health care provider.  Do not drink alcohol if your health care provider tells you not to drink.  If you drink alcohol: ? Limit how much you have to 0-1 drink a day. ? Be aware of how much alcohol is in your drink. In the [place] one drink equals one 12 oz bottle of beer (355 mL), one 5 oz glass of wine (148 mL), or one 1 oz glass of hard liquor (44 mL). Lifestyle  Take daily care of your teeth and gums.  Stay active. Exercise for at least 30 minutes on 5 or more days each week.  Do not use any products that contain nicotine or tobacco, such as cigarettes, e-cigarettes, and chewing tobacco. If you need help quitting, ask your health care provider.  If you are sexually active, practice safe sex. Use a condom or other form of protection in order to prevent STIs (sexually transmitted infections).  Talk with your health care provider about taking a low-dose aspirin or statin. What's next?  Go to your health care provider once a year for a well check visit.  Ask your health care provider how often you should have your eyes and teeth checked.  Stay up to date on all vaccines. This information is not intended to replace advice given to you by your health care provider. Make sure you discuss any questions you have with your health care provider. Document Released: 08/25/2015 Document Revised: 07/23/2018 Document Reviewed: 07/23/2018 Elsevier Patient Education  2020 [employer].

## 2019-05-25 DIAGNOSIS — Z1231 Encounter for screening mammogram for malignant neoplasm of breast: Secondary | ICD-10-CM | POA: Diagnosis not present

## 2019-06-08 ENCOUNTER — Telehealth: Payer: Self-pay | Admitting: Family Medicine

## 2019-06-08 NOTE — Telephone Encounter (Signed)
televisit made with Jones on 06/09/19

## 2019-06-09 ENCOUNTER — Encounter: Payer: Self-pay | Admitting: Physician Assistant

## 2019-06-09 ENCOUNTER — Ambulatory Visit (INDEPENDENT_AMBULATORY_CARE_PROVIDER_SITE_OTHER): Payer: Medicare HMO | Admitting: Physician Assistant

## 2019-06-09 DIAGNOSIS — J209 Acute bronchitis, unspecified: Secondary | ICD-10-CM

## 2019-06-09 MED ORDER — PREDNISONE 10 MG (21) PO TBPK: ORAL_TABLET | ORAL | 0 refills | Status: DC

## 2019-06-09 MED ORDER — HYDROCODONE-HOMATROPINE 5-1.5 MG/5ML PO SYRP: 5.0000 mL | ORAL_SOLUTION | Freq: Three times a day (TID) | ORAL | 0 refills | Status: DC | PRN

## 2019-06-09 MED ORDER — AZITHROMYCIN 250 MG PO TABS: ORAL_TABLET | ORAL | 0 refills | Status: DC

## 2019-06-09 NOTE — Progress Notes (Signed)
Telephone visit  Subjective: CC: Cough PCP: Dettinger, Fransisca Kaufmann, MD [id number] Felicia Pugh is a 73 y.o. female calls for telephone consult today. Patient provides verbal consent for consult held via phone.  Patient is identified with 2 separate identifiers.  At this time the entire area is on COVID-19 social distancing and stay home orders are in place.  Patient is of higher risk and therefore we are performing this by a virtual method.  Location of patient: Home Location of provider: HOME Others present for call: No  Patient with several days of progressing upper respiratory and bronchial symptoms. Initially there was more upper respiratory congestion. This progressed to having significant cough that is productive throughout the day and severe at night. There is occasional wheezing after coughing. Sometimes there is slight dyspnea on exertion. It is productive mucus that is yellow in color. Denies any blood.  She has had bronchitis is in the past.  She does not know of any COVID-19 exposure.  She has dealt with this for over 2 weeks.   ROS: Per HPI  Allergies  Allergen Reactions  . Metformin And Related     Severe diarrhea  . Asa [Aspirin]     GI Dr does not want her to take due to hx ulcers  . Nsaids     GI Dr does not want her to take due to hx of ulcers  . Other Diarrhea    Steroids  . Tolmetin Other (See Comments)    GI Dr does not want her to take due to hx of ulcers GI Dr does not want her to take due to hx of ulcers    Past Medical History:  Diagnosis Date  . 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 Cataract   . Complication of anesthesia    difficulty breathing after waking up from anesthesia  . Crohn's disease ([place])   . Diabetes mellitus without complication ([place])   . Diabetic neuropathy ([place])   . Hyperlipidemia   . Hypertension   . IBS (irritable bowel syndrome)   . Stroke [facility]) 2011  . Syncope and  collapse   . Thyroid nodule   . Ulcer     Current Outpatient Medications:  .  atorvastatin (LIPITOR) 40 MG tablet, Take 1 tablet (40 mg total) by mouth daily., Disp: 90 tablet, Rfl: 3 .  azithromycin (ZITHROMAX Z-PAK) 250 MG tablet, Take as directed, Disp: 6 each, Rfl: 0 .  balsalazide (COLAZAL) 750 MG capsule, Take 3 capsules (2,250 mg total) by mouth 3 (three) times daily., Disp: 810 capsule, Rfl: 2 .  bismuth subsalicylate (PEPTO BISMOL) 262 MG/15ML suspension, 1 tsp in the AM as needed, Disp: , Rfl:  .  ferrous sulfate (IRON SUPPLEMENT) 325 (65 FE) MG tablet, Take 325 mg by mouth daily., Disp: , Rfl:  .  HYDROcodone-homatropine (HYCODAN) 5-1.5 MG/5ML syrup, Take 5 mLs by mouth every 8 (eight) hours as needed for cough., Disp: 120 mL, Rfl: 0 .  Insulin Glargine (BASAGLAR KWIKPEN) 100 UNIT/ML SOPN, Inject 0.2-0.3 mLs (20-30 Units total) into the skin daily., Disp: 5 pen, Rfl: 3 .  Insulin Syringe-Needle U-100 31G X 1/4" 1 ML MISC, 1 Syringe by Does not apply route 2 (two) times daily., Disp: 60 each, Rfl: 6 .  linagliptin (TRADJENTA) 5 MG TABS tablet, Take 1 tablet (5 mg total) by mouth daily., Disp: 90 tablet, Rfl: 3 .  losartan-hydrochlorothiazide (HYZAAR) 100-25 MG  tablet, Take 1 tablet by mouth daily. No further refills will be given till you have your routine follow up, Disp: 90 tablet, Rfl: 3 .  ONETOUCH VERIO test strip, USE 1 STRIP TO CHECK GLUCOSE TWICE DAILY, Disp: 200 each, Rfl: 0 .  predniSONE (STERAPRED UNI-PAK 21 TAB) 10 MG (21) TBPK tablet, As directed x 6 days, Disp: 21 tablet, Rfl: 0 .  therapeutic multivitamin-minerals (THERAGRAN-M) tablet, Take 1 tablet by mouth daily., Disp: , Rfl:   Assessment/ Plan: 73 y.o. female   1. Bronchitis with bronchospasm - HYDROcodone-homatropine (HYCODAN) 5-1.5 MG/5ML syrup; Take 5 mLs by mouth every 8 (eight) hours as needed for cough.  Dispense: 120 mL; Refill: 0 - predniSONE (STERAPRED UNI-PAK 21 TAB) 10 MG (21) TBPK tablet; As directed  x 6 days  Dispense: 21 tablet; Refill: 0 - azithromycin (ZITHROMAX Z-PAK) 250 MG tablet; Take as directed  Dispense: 6 each; Refill: 0   No follow-ups on file.  Continue all other maintenance medications as listed above.  Start time: 8:59 AM End time: 9:08 AM  Meds ordered this encounter  Medications  . HYDROcodone-homatropine (HYCODAN) 5-1.5 MG/5ML syrup    Sig: Take 5 mLs by mouth every 8 (eight) hours as needed for cough.    Dispense:  120 mL    Refill:  0    Order Specific Question:   Supervising Provider    Answer:   Brooke Bonito  . predniSONE (STERAPRED UNI-PAK 21 TAB) 10 MG (21) TBPK tablet    Sig: As directed x 6 days    Dispense:  21 tablet    Refill:  0    Order Specific Question:   Supervising Provider    Answer:   Brooke Bonito  . azithromycin (ZITHROMAX Z-PAK) 250 MG tablet    Sig: Take as directed    Dispense:  6 each    Refill:  0    Order Specific Question:   Supervising Provider    Answer:   Terald Sleeper [[id number]]    Particia Nearing PA-C [place] [phone]

## 2019-06-30 ENCOUNTER — Ambulatory Visit (INDEPENDENT_AMBULATORY_CARE_PROVIDER_SITE_OTHER): Payer: Medicare HMO | Admitting: Nurse Practitioner

## 2019-06-30 ENCOUNTER — Encounter: Payer: Self-pay | Admitting: Nurse Practitioner

## 2019-06-30 ENCOUNTER — Other Ambulatory Visit: Payer: Self-pay

## 2019-06-30 VITALS — BP 125/79 | HR 86 | Temp 96.6°F | Ht 63.0 in | Wt 171.2 lb

## 2019-06-30 DIAGNOSIS — K5000 Crohn's disease of small intestine without complications: Secondary | ICD-10-CM

## 2019-06-30 DIAGNOSIS — K529 Noninfective gastroenteritis and colitis, unspecified: Secondary | ICD-10-CM | POA: Diagnosis not present

## 2019-06-30 DIAGNOSIS — D638 Anemia in other chronic diseases classified elsewhere: Secondary | ICD-10-CM | POA: Diagnosis not present

## 2019-06-30 NOTE — Patient Instructions (Signed)
Your health issues we discussed today were:   Chronic diarrhea: 1. Continue taking Imodium as needed for your diarrhea 2. Let us know if you have worsening or severe symptoms  Anemia with low iron: 1. Your anemia (low blood level) looks like it has been normal for the last few blood cell checks 2. Because your iron has not been checked in about 2 years I will put in orders to have this checked. 3. Further recommendations to follow  Crohn's disease: 1. Continue taking balsalazide (Colazal). 2. Let us know if you have any worsening diarrhea, abdominal pain, or blood in your stools 3. Try to avoid NSAIDs (ibuprofen, Motrin, Advil, Aleve, naproxen, Naprosyn, Goody powders, BC powders, anything with "NSAID" on the bottle) as this can cause a flare of your inflammatory bowel disease/Crohn's disease 4. Call us for any worsening or severe symptoms  Overall I recommend:  1. Continue your other current medications 2. Return for follow-up in 6 months 3. Call us if you have any questions or concerns.   Because of recent events of COVID-19 ("Coronavirus"), follow CDC recommendations:  1. Wash your hand frequently 2. Avoid touching your face 3. Stay away from people who are sick 4. If you have symptoms such as fever, cough, shortness of breath then call your healthcare provider for further guidance 5. If you are sick, STAY AT HOME unless otherwise directed by your healthcare provider. 6. Follow directions from state and national officials regarding staying safe   At [facility] we value your feedback. You may receive a survey about your visit today. Please share your experience as we strive to create trusting relationships with our patients to provide genuine, compassionate, quality care.  We appreciate your understanding and patience as we review any laboratory studies, imaging, and other diagnostic tests that are ordered as we care for you. Our office policy is 5 business days  for review of these results, and any emergent or urgent results are addressed in a timely manner for your best interest. If you do not hear from our office in 1 week, please contact us.   We also encourage the use of MyChart, which contains your medical information for your review as well. If you are not enrolled in this feature, an access code is on this after visit summary for your convenience. Thank you for allowing [place] to be involved in your care.  It was great to see you today!  I hope you have a Happy Thanksgiving!!

## 2019-06-30 NOTE — Progress Notes (Signed)
Referring Provider: Dettinger, Fransisca Kaufmann, MD Primary Care Physician:  Dettinger, Fransisca Kaufmann, MD Primary GI:  Dr. Gala Romney  Chief Complaint  Patient presents with  . Crohn's Disease    HPI:   Felicia Pugh is a 73 y.o. female who presents for follow-up on Crohn's disease and chronic diarrhea.  The patient was last seen in our office 12/23/2018 for the same.  The patient's last visit was a virtual office visit due to COVID-19/coronavirus pandemic.  Noted chronic history of diarrhea and Crohn's disease, elevated alkaline phosphatase.  Colonoscopy up-to-date 2018 with active ileitis otherwise normal.  Her LFTs normalized including alkaline phosphatase and GGT.  Insurance previously would not approve the [place] but covered Colazal.  Also on Bentyl for IBS overlay.  Imodium does help as well.  At her last visit she was having occasional days where she will have 4-5 stools, some days only 1 stool with intermittent diarrhea.  No hematochezia.  Stools dark on iron.  Energy is good.  Imodium as needed.  Stress is a big trigger for her IBS.  No other overt GI complaints including rashes or oral sores.  Recommended updated labs related to Crohn's disease and medication monitoring.  Follow-up in 6 months.  CBC, CMP completed 01/26/2019.  Hemoglobin stable/improved at 11.4 compared to 10.1.  Baseline tends to be in the low to upper 11 range.  WBC count normal.  CMP essentially normal.  Of note in November 2019 we checked a CRP and sed rate, both of which were normal.  Today she states she's doing ok overall. Still on Colazal, no issues with that. Sometimes taking 2 pills tid rather than 3 pills tid. Is also using Imodium prn. Only days she typically has a diarrhea flare is from eating something she shouldn't. Diarrhea is better. Denies hematochezia. Has dark stools on iron supplement. Has not had iron levels checked in some time (per our records last Iron/Ferritin was 2018). Denies abdominal pain, N/V, fever,  chills, unintentional weight loss. Denies URI or flu-like symptoms. Denies loss of sense of taste or smell. Denies chest pain, dyspnea, dizziness, lightheadedness, syncope, near syncope. Denies any other upper or lower GI symptoms.  Past Medical History:  Diagnosis Date  . 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 Cataract   . Complication of anesthesia    difficulty breathing after waking up from anesthesia  . Crohn's disease ([place])   . Diabetes mellitus without complication ([place])   . Diabetic neuropathy ([place])   . Hyperlipidemia   . Hypertension   . IBS (irritable bowel syndrome)   . Stroke [facility]) 2011  . Syncope and collapse   . Thyroid nodule   . Ulcer     Past Surgical History:  Procedure Laterality Date  . ABDOMINAL HYSTERECTOMY     partial  . BACK SURGERY     x3 (Dr Loyola Mast, Dr Patrice Paradise)  . BREAST SURGERY Right 2002   cyst removed  . COLONOSCOPY  10/102006   RMR: normal  . COLONOSCOPY  09/06/2009   RMR: Suboptimal prep on the right side. Ulcers noted at cecum and in the descending colon. Terminal ileum could not be intubated. Ounces with chronic active colitis, question Crohn's  . COLONOSCOPY N/A 03/27/2015   Dr. Gala Romney: abnormal cecum and IC valve most consistent with IBD. TI intubated and appeared normal. Likely Crohn's disease. Patient denies NSAID use. No improvement with Lialda samples at time of  initial diagnosis  . COLONOSCOPY N/A 05/15/2017   Dr. Gala Romney: ab normal IC valve, s/p biopsy with active ileitis, diverticulosis in sigmoid and descending colon. Non-bleeding internal hemorrhoids  . ESOPHAGOGASTRODUODENOSCOPY    . LAPAROSCOPIC APPENDECTOMY N/A 07/21/2016   Procedure: APPENDECTOMY LAPAROSCOPIC;  Surgeon: Aviva Signs, MD;  Location: [facility];  Service: General;  Laterality: N/A;  . Small bowel capsule endoscopy  06/10/2005   RMR: single erosion versus AVM, distal ileum  . Small bowel capsule endoscopy  prior to  05/2005   [place]: reported erosions/ulcerations per medical record. actual report unavailable.    Current Outpatient Medications  Medication Sig Dispense Refill  . atorvastatin (LIPITOR) 40 MG tablet Take 1 tablet (40 mg total) by mouth daily. 90 tablet 3  . balsalazide (COLAZAL) 750 MG capsule Take 3 capsules (2,250 mg total) by mouth 3 (three) times daily. (Patient taking differently: Take 750 mg by mouth 3 (three) times daily. ) 810 capsule 2  . bismuth subsalicylate (PEPTO BISMOL) 262 MG/15ML suspension 1 tsp in the AM as needed    . Ferrous Gluconate (IRON 27 PO) Take by mouth daily.    . Insulin Glargine (BASAGLAR KWIKPEN) 100 UNIT/ML SOPN Inject 0.2-0.3 mLs (20-30 Units total) into the skin daily. (Patient taking differently: Inject 20 Units into the skin daily. ) 5 pen 3  . Insulin Syringe-Needle U-100 31G X 1/4" 1 ML MISC 1 Syringe by Does not apply route 2 (two) times daily. 60 each 6  . linagliptin (TRADJENTA) 5 MG TABS tablet Take 1 tablet (5 mg total) by mouth daily. 90 tablet 3  . loperamide (IMODIUM) 1 MG/5ML solution Take 1 mg by mouth as needed for diarrhea or loose stools.    [name] losartan-hydrochlorothiazide (HYZAAR) 100-25 MG tablet Take 1 tablet by mouth daily. No further refills will be given till you have your routine follow up 90 tablet 3  . ONETOUCH VERIO test strip USE 1 STRIP TO CHECK GLUCOSE TWICE DAILY 200 each 0  . therapeutic multivitamin-minerals (THERAGRAN-M) tablet Take 1 tablet by mouth daily.     No current facility-administered medications for this visit.     Allergies as of 06/30/2019 - Review Complete 06/30/2019  Allergen Reaction Noted  . Metformin and related  05/11/2014  . Asa [aspirin]  11/04/2012  . Nsaids  11/04/2012  . Other Diarrhea 12/25/2014  . Tolmetin Other (See Comments) 11/04/2012    Family History  Problem Relation Age of Onset  . Hypertension Mother   . Stroke Mother   . Diabetes Father        w/ BKA  . Hypertension Father   .  Asthma Father   . Cancer Sister   . Diabetes Sister        stomach  . Diabetes Brother   . Diabetes Sister   . Cancer Sister        breast  . Hypertension Sister   . Hypertension Daughter   . Hypertension Son   . Multiple sclerosis Son   . Colon cancer Neg Hx   . Inflammatory bowel disease Neg Hx     Social History   Socioeconomic History  . Marital status: Married    Spouse name: Herbie Baltimore  . Number of children: 5  . Years of education: 9th grade-then got her GED at [employer]  . Highest education level: GED or equivalent  Occupational History  . Occupation: retired  Scientific laboratory technician  . Financial resource strain: Not hard at all  . Food insecurity  Worry: Never true    Inability: Never true  . Transportation needs    Medical: No    Non-medical: No  Tobacco Use  . Smoking status: Never Smoker  . Smokeless tobacco: Never Used  Substance and Sexual Activity  . Alcohol use: No  . Drug use: No  . Sexual activity: Not Currently  Lifestyle  . Physical activity    Days per week: 0 days    Minutes per session: 0 min  . Stress: Not at all  Relationships  . Social connections    Talks on phone: More than three times a week    Gets together: More than three times a week    Attends religious service: More than 4 times per year    Active member of club or organization: Yes    Attends meetings of clubs or organizations: More than 4 times per year    Relationship status: Married  Other Topics Concern  . Not on file  Social History Narrative  . Not on file    Review of Systems: General: Negative for anorexia, weight loss, fever, chills, fatigue, weakness. ENT: Negative for hoarseness, difficulty swallowing. CV: Negative for chest pain, angina, palpitations, peripheral edema.  Respiratory: Negative for dyspnea at rest, cough, sputum, wheezing.  GI: See history of present illness. Derm: Negative for rash.  Endo: Negative for unusual weight change.  Heme: Negative for bruising or  bleeding. Allergy: Negative for rash or hives.   Physical Exam: BP 125/79   Pulse 86   Temp (!) 96.6 F (35.9 C) (Temporal)   Ht 5' 3"  (1.6 m)   Wt 171 lb 3.2 oz (77.7 kg)   BMI 30.33 kg/m  General:   Alert and oriented. Pleasant and cooperative. Well-nourished and well-developed.  Eyes:  Without icterus, sclera clear and conjunctiva pink.  Ears:  Normal auditory acuity. Mouth:  No deformity or lesions, oral mucosa pink.  Cardiovascular:  S1, S2 present without murmurs appreciated. Extremities without clubbing or edema. Respiratory:  Clear to auscultation bilaterally. No wheezes, rales, or rhonchi. No distress.  Gastrointestinal:  +BS, soft, non-tender and non-distended. No HSM noted. No guarding or rebound. No masses appreciated.  Rectal:  Deferred  Musculoskalatal:  Symmetrical without gross deformities. Skin:  Intact without significant lesions or rashes. Neurologic:  Alert and oriented x4;  grossly normal neurologically. Psych:  Alert and cooperative. Normal mood and affect. Heme/Lymph/Immune: No excessive bruising noted.    06/30/2019 10:56 AM   Disclaimer: This note was dictated with voice recognition software. Similar sounding words can inadvertently be transcribed and may not be corrected upon review.

## 2019-06-30 NOTE — Assessment & Plan Note (Signed)
Chronic diarrhea likely multifactorial in nature between IBD and IBS.  Her diarrhea is well managed at this time with as needed Imodium.  It helps that her Crohn's disease is well managed as well.  Recent labs essentially normal.  Recommend she continue Imodium as needed, call us for any worsening diarrhea and follow-up in 6 months.

## 2019-06-30 NOTE — Assessment & Plan Note (Signed)
Previously with iron deficiency and anemia.  Recent labs have normalized in regards to her hemoglobin.  This was last checked about 1 to 2 months ago.  However, she has not had her iron levels checked in about 2 years.  She continues on oral iron with dark in stools.  At this point I will recheck her iron and ferritin to help gauge if ongoing iron therapy is necessary.  Follow-up in 6 months.  Call for any problems.

## 2019-06-30 NOTE — Assessment & Plan Note (Signed)
Crohn's disease symptomatically well managed on Colazal.  Denies abdominal pain, worsening diarrhea, hematochezia.  Generally asymptomatic from a GI standpoint.  Recommend she continue her current medications.  Her CBC and CMP were just recently ordered about 1 to 2 months ago and look normal.  Follow-up in 6 months.

## 2019-07-01 LAB — IRON: Iron: 79 ug/dL (ref 45–160)

## 2019-07-01 LAB — FERRITIN: Ferritin: 422 ng/mL — ABNORMAL HIGH (ref 16–288)

## 2019-07-01 NOTE — Progress Notes (Signed)
CC'ED TO PCP 

## 2019-07-05 ENCOUNTER — Other Ambulatory Visit: Payer: Self-pay

## 2019-07-06 ENCOUNTER — Telehealth: Payer: Self-pay | Admitting: Family Medicine

## 2019-07-07 ENCOUNTER — Ambulatory Visit: Payer: Medicare HMO

## 2019-07-07 ENCOUNTER — Other Ambulatory Visit: Payer: Self-pay

## 2019-07-13 ENCOUNTER — Telehealth: Payer: Self-pay | Admitting: Family Medicine

## 2019-07-13 NOTE — Telephone Encounter (Signed)
Sorry, it is ok to increase to 40 units. No need to split dose since it last 24 hours.

## 2019-07-13 NOTE — Telephone Encounter (Signed)
Already on 35 - please clarify dose.   Also - could she split the dose and do a portion in AM and some in PM?

## 2019-07-13 NOTE — Telephone Encounter (Signed)
Patient says Dr Dettinger put her on Insulin, 35 units, but says her sugar is still too high (250-285). Patient wants to know if she can take 40 units or if that would be too much.

## 2019-07-13 NOTE — Telephone Encounter (Signed)
Pt aware.

## 2019-07-13 NOTE — Telephone Encounter (Signed)
She can increase to 35 units daily. Strict low carb diet. Needs follow up with PCP

## 2019-07-14 ENCOUNTER — Other Ambulatory Visit: Payer: Self-pay

## 2019-07-14 ENCOUNTER — Ambulatory Visit (INDEPENDENT_AMBULATORY_CARE_PROVIDER_SITE_OTHER): Payer: Medicare HMO

## 2019-07-14 DIAGNOSIS — Z23 Encounter for immunization: Secondary | ICD-10-CM

## 2019-08-11 DIAGNOSIS — H18513 Endothelial corneal dystrophy, bilateral: Secondary | ICD-10-CM | POA: Diagnosis not present

## 2019-08-11 DIAGNOSIS — H2513 Age-related nuclear cataract, bilateral: Secondary | ICD-10-CM | POA: Diagnosis not present

## 2019-08-11 DIAGNOSIS — H2510 Age-related nuclear cataract, unspecified eye: Secondary | ICD-10-CM | POA: Diagnosis not present

## 2019-08-11 DIAGNOSIS — E109 Type 1 diabetes mellitus without complications: Secondary | ICD-10-CM | POA: Diagnosis not present

## 2019-08-11 DIAGNOSIS — H35363 Drusen (degenerative) of macula, bilateral: Secondary | ICD-10-CM | POA: Diagnosis not present

## 2019-08-11 DIAGNOSIS — I10 Essential (primary) hypertension: Secondary | ICD-10-CM | POA: Diagnosis not present

## 2019-08-11 DIAGNOSIS — H5200 Hypermetropia, unspecified eye: Secondary | ICD-10-CM | POA: Diagnosis not present

## 2019-08-11 DIAGNOSIS — E7800 Pure hypercholesterolemia, unspecified: Secondary | ICD-10-CM | POA: Diagnosis not present

## 2019-08-16 ENCOUNTER — Telehealth: Payer: Self-pay | Admitting: Family Medicine

## 2019-08-16 NOTE — Telephone Encounter (Signed)
Pt called stating that she received a missed call from [place]. Wants to be called back by nurse.

## 2019-09-06 DIAGNOSIS — Z1152 Encounter for screening for COVID-19: Secondary | ICD-10-CM | POA: Diagnosis not present

## 2019-09-23 ENCOUNTER — Ambulatory Visit: Payer: Medicare Other | Attending: Family

## 2019-09-23 DIAGNOSIS — Z23 Encounter for immunization: Secondary | ICD-10-CM

## 2019-09-23 NOTE — Progress Notes (Signed)
   Covid-19 Vaccination Clinic  Name:  NOELLE SEASE    MRN: [id number] DOB: 1946/04/12  09/23/2019  Ms. Cihlar was observed post Covid-19 immunization for 15 minutes without incidence. She was provided with Vaccine Information Sheet and instruction to access the V-Safe system.   Ms. Marseille was instructed to call 911 with any severe reactions post vaccine: [name] Difficulty breathing  . Swelling of your face and throat  . A fast heartbeat  . A bad rash all over your body  . Dizziness and weakness    Immunizations Administered    Name Date Dose VIS Date Route   Moderna COVID-19 Vaccine 09/23/2019  3:19 PM 0.5 mL 07/13/2019 Intramuscular   Manufacturer: Moderna   Lot: [id number]   [place]: [id number]

## 2019-10-21 ENCOUNTER — Telehealth: Payer: Self-pay | Admitting: *Deleted

## 2019-10-21 DIAGNOSIS — E1169 Type 2 diabetes mellitus with other specified complication: Secondary | ICD-10-CM

## 2019-10-21 DIAGNOSIS — Z794 Long term (current) use of insulin: Secondary | ICD-10-CM

## 2019-10-21 NOTE — Telephone Encounter (Signed)
PA started for Basaglar flexpen today   Sent to plan  Tried and failed 5 other meds in med [place]   Key: [id number]

## 2019-10-21 NOTE — Telephone Encounter (Signed)
Med APPROVED by plan   [id number] - PA Case ID: [id number]  Pt and pharm aware

## 2019-10-26 ENCOUNTER — Ambulatory Visit: Payer: Medicare Other | Attending: Family

## 2019-10-26 DIAGNOSIS — Z23 Encounter for immunization: Secondary | ICD-10-CM

## 2019-10-26 NOTE — Progress Notes (Signed)
   Covid-19 Vaccination Clinic  Name:  Felicia Pugh    MRN: [id number] DOB: [birth year]  10/26/2019  [name] was observed post Covid-19 immunization for 15 minutes without incident. She was provided with Vaccine Information Sheet and instruction to access the V-Safe system.   [name] was instructed to call 911 with any severe reactions post vaccine: [name] Difficulty breathing  . Swelling of face and throat  . A fast heartbeat  . A bad rash all over body  . Dizziness and weakness   Immunizations Administered    Name Date Dose VIS Date Route   Moderna COVID-19 Vaccine 10/26/2019 12:46 PM 0.5 mL 07/13/2019 Intramuscular   Manufacturer: Moderna   Lot: [id number]   [place]: [id number]

## 2019-10-27 ENCOUNTER — Other Ambulatory Visit: Payer: Self-pay

## 2019-10-27 ENCOUNTER — Encounter: Payer: Self-pay | Admitting: Family Medicine

## 2019-10-27 ENCOUNTER — Ambulatory Visit (INDEPENDENT_AMBULATORY_CARE_PROVIDER_SITE_OTHER): Payer: Medicare Other | Admitting: Family Medicine

## 2019-10-27 VITALS — BP 117/69 | HR 94 | Temp 97.1°F | Ht 63.0 in | Wt 173.0 lb

## 2019-10-27 DIAGNOSIS — I10 Essential (primary) hypertension: Secondary | ICD-10-CM | POA: Diagnosis not present

## 2019-10-27 DIAGNOSIS — E669 Obesity, unspecified: Secondary | ICD-10-CM

## 2019-10-27 DIAGNOSIS — E785 Hyperlipidemia, unspecified: Secondary | ICD-10-CM | POA: Diagnosis not present

## 2019-10-27 DIAGNOSIS — E1169 Type 2 diabetes mellitus with other specified complication: Secondary | ICD-10-CM | POA: Diagnosis not present

## 2019-10-27 DIAGNOSIS — Z794 Long term (current) use of insulin: Secondary | ICD-10-CM | POA: Diagnosis not present

## 2019-10-27 DIAGNOSIS — I152 Hypertension secondary to endocrine disorders: Secondary | ICD-10-CM

## 2019-10-27 DIAGNOSIS — E1159 Type 2 diabetes mellitus with other circulatory complications: Secondary | ICD-10-CM

## 2019-10-27 LAB — BAYER DCA HB A1C WAIVED: HB A1C (BAYER DCA - WAIVED): 9.2 % — ABNORMAL HIGH (ref <7.0)

## 2019-10-27 NOTE — Progress Notes (Signed)
BP 117/69   Pulse 94   Temp (!) 97.1 F (36.2 C)   Ht 5' 3"  (1.6 m)   Wt 173 lb (78.5 kg)   SpO2 97%   BMI 30.65 kg/m    Subjective:   Patient ID: Felicia Pugh, female    DOB: Jun 23, 1946, 74 y.o.   MRN: [id number]  HPI: [name] is a 74 y.o. female presenting on 10/27/2019 for Medical Management of Chronic Issues and Diabetes   HPI Type 2 diabetes mellitus Patient comes in today for recheck of his diabetes. Patient has been currently taking Basaglar 40 units and Tradjenta. Patient is currently on an ACE inhibitor/ARB. Patient has not seen an ophthalmologist this year. Patient denies any issues with their feet.   Hypertension Patient is currently on losartan hydrochlorothiazide, and their blood pressure today is 117/69. Patient denies any lightheadedness or dizziness. Patient denies headaches, blurred vision, chest pains, shortness of breath, or weakness. Denies any side effects from medication and is content with current medication.   Hyperlipidemia Patient is coming in for recheck of his hyperlipidemia. The patient is currently taking atorvastatin. They deny any issues with myalgias or history of liver damage from it. They deny any focal numbness or weakness or chest pain.  Patient is obese with a BMI of 30 but she has been losing weight and trying to eat better.  Relevant past medical, surgical, family and social history reviewed and updated as indicated. Interim medical history since our last visit reviewed. Allergies and medications reviewed and updated.  Review of Systems  Constitutional: Negative for chills and fever.  HENT: Negative for congestion, ear discharge and ear pain.   Eyes: Negative for redness and visual disturbance.  Respiratory: Negative for chest tightness and shortness of breath.   Cardiovascular: Negative for chest pain and leg swelling.  Genitourinary: Negative for difficulty urinating and dysuria.  Musculoskeletal: Negative for back pain and  gait problem.  Skin: Negative for rash.  Neurological: Negative for dizziness, light-headedness and headaches.  Psychiatric/Behavioral: Negative for agitation and behavioral problems.  All other systems reviewed and are negative.   Per HPI unless specifically indicated above   Allergies as of 10/27/2019      Reactions   Metformin And Related    Severe diarrhea   Asa [aspirin]    GI Dr does not want her to take due to hx ulcers   Nsaids    GI Dr does not want her to take due to hx of ulcers   Other Diarrhea   Steroids   Tolmetin Other (See Comments)   GI Dr does not want her to take due to hx of ulcers GI Dr does not want her to take due to hx of ulcers      Medication List       Accurate as of October 27, 2019  2:14 PM. If you have any questions, ask your nurse or doctor.        atorvastatin 40 MG tablet Commonly known as: LIPITOR Take 1 tablet (40 mg total) by mouth daily.   balsalazide 750 MG capsule Commonly known as: COLAZAL Take 3 capsules (2,250 mg total) by mouth 3 (three) times daily. What changed: how much to take   Basaglar KwikPen 100 UNIT/ML Inject 0.2-0.3 mLs (20-30 Units total) into the skin daily. What changed: how much to take   bismuth subsalicylate 130 QM/57QI suspension Commonly known as: PEPTO BISMOL 1 tsp in the AM as needed   Insulin Syringe-Needle  U-100 31G X 1/4" 1 ML Misc 1 Syringe by Does not apply route 2 (two) times daily.   IRON 27 PO Take by mouth daily.   linagliptin 5 MG Tabs tablet Commonly known as: TRADJENTA Take 1 tablet (5 mg total) by mouth daily.   loperamide 1 MG/5ML solution Commonly known as: IMODIUM Take 1 mg by mouth as needed for diarrhea or loose stools.   losartan-hydrochlorothiazide 100-25 MG tablet Commonly known as: HYZAAR Take 1 tablet by mouth daily. No further refills will be given till you have your routine follow up   OneTouch Verio test strip Generic drug: glucose blood USE 1 STRIP TO CHECK  GLUCOSE TWICE DAILY   therapeutic multivitamin-minerals tablet Take 1 tablet by mouth daily.        Objective:   BP 117/69   Pulse 94   Temp (!) 97.1 F (36.2 C)   Ht 5' 3"  (1.6 m)   Wt 173 lb (78.5 kg)   SpO2 97%   BMI 30.65 kg/m   Wt Readings from Last 3 Encounters:  10/27/19 173 lb (78.5 kg)  06/30/19 171 lb 3.2 oz (77.7 kg)  10/12/18 180 lb 9.6 oz (81.9 kg)    Physical Exam Vitals and nursing note reviewed.  Constitutional:      General: She is not in acute distress.    Appearance: She is well-developed. She is not diaphoretic.  Eyes:     Conjunctiva/sclera: Conjunctivae normal.     Pupils: Pupils are equal, round, and reactive to light.  Cardiovascular:     Rate and Rhythm: Normal rate and regular rhythm.     Heart sounds: Normal heart sounds. No murmur.  Pulmonary:     Effort: Pulmonary effort is normal. No respiratory distress.     Breath sounds: Normal breath sounds. No wheezing.  Musculoskeletal:        General: No tenderness. Normal range of motion.  Skin:    General: Skin is warm and dry.     Findings: No rash.  Neurological:     Mental Status: She is alert and oriented to person, place, and time.     Coordination: Coordination normal.  Psychiatric:        Behavior: Behavior normal.    Diabetic Foot Exam - Simple   Simple Foot Form Diabetic Foot exam was performed with the following findings: Yes 10/27/2019  2:33 PM  Visual Inspection No deformities, no ulcerations, no other skin breakdown bilaterally: Yes Sensation Testing Intact to touch and monofilament testing bilaterally: Yes Pulse Check Posterior Tibialis and Dorsalis pulse intact bilaterally: Yes Comments       Assessment & Plan:   Problem List Items Addressed This Visit      Cardiovascular and Mediastinum   Hypertension associated with diabetes ([place])   Relevant Orders   CBC with Differential/Platelet   CMP14+EGFR     Endocrine   Hyperlipidemia associated with type 2  diabetes mellitus ([place])   Relevant Orders   Lipid panel   Type 2 diabetes mellitus with other specified complication ([place]) - Primary   Relevant Orders   Bayer DCA Hb A1c Waived     Other   Obesity (BMI 30-39.9)    A1c is up at 9.2, gave her a sample for [place] and recommended to start her same dose that she has been doing of the Basaglar at 40 units we will see from there how it goes.  Follow up plan: Return in about 3 months (around 01/27/2020), or if symptoms  worsen or fail to improve, for Diabetes hypertension cholesterol.  Counseling provided for all of the vaccine components Orders Placed This Encounter  Procedures  . [facility] Hb A1c Dendron, MD [facility] 10/27/2019, 2:14 PM

## 2019-10-28 LAB — LIPID PANEL
Chol/HDL Ratio: 2.2 ratio (ref 0.0–4.4)
Cholesterol, Total: 174 mg/dL (ref 100–199)
HDL: 79 mg/dL (ref >39)
LDL Chol Calc (NIH): 82 mg/dL (ref 0–99)
Triglycerides: 67 mg/dL (ref 0–149)
VLDL Cholesterol Cal: 13 mg/dL (ref 5–40)

## 2019-10-28 LAB — CBC WITH DIFFERENTIAL/PLATELET
Basophils Absolute: 0.1 10*3/uL (ref 0.0–0.2)
Basos: 1 %
EOS (ABSOLUTE): 0.2 10*3/uL (ref 0.0–0.4)
Eos: 2 %
Hematocrit: 36.3 % (ref 34.0–46.6)
Hemoglobin: 11.8 g/dL (ref 11.1–15.9)
Immature Grans (Abs): 0 10*3/uL (ref 0.0–0.1)
Immature Granulocytes: 0 %
Lymphocytes Absolute: 1.1 10*3/uL (ref 0.7–3.1)
Lymphs: 10 %
MCH: 28.4 pg (ref 26.6–33.0)
MCHC: 32.5 g/dL (ref 31.5–35.7)
MCV: 87 fL (ref 79–97)
Monocytes Absolute: 0.9 10*3/uL (ref 0.1–0.9)
Monocytes: 8 %
Neutrophils Absolute: 8.7 10*3/uL — ABNORMAL HIGH (ref 1.4–7.0)
Neutrophils: 79 %
Platelets: 306 10*3/uL (ref 150–450)
RBC: 4.16 x10E6/uL (ref 3.77–5.28)
RDW: 12.7 % (ref 11.7–15.4)
WBC: 10.9 10*3/uL — ABNORMAL HIGH (ref 3.4–10.8)

## 2019-10-28 LAB — CMP14+EGFR
ALT: 15 IU/L (ref 0–32)
AST: 18 IU/L (ref 0–40)
Albumin/Globulin Ratio: 1.5 (ref 1.2–2.2)
Albumin: 4.3 g/dL (ref 3.7–4.7)
Alkaline Phosphatase: 112 IU/L (ref 39–117)
BUN/Creatinine Ratio: 19 (ref 12–28)
BUN: 16 mg/dL (ref 8–27)
Bilirubin Total: 0.4 mg/dL (ref 0.0–1.2)
CO2: 26 mmol/L (ref 20–29)
Calcium: 9.7 mg/dL (ref 8.7–10.3)
Chloride: 101 mmol/L (ref 96–106)
Creatinine, Ser: 0.83 mg/dL (ref 0.57–1.00)
GFR calc Af Amer: 81 mL/min/{1.73_m2} (ref >59)
GFR calc non Af Amer: 70 mL/min/{1.73_m2} (ref >59)
Globulin, Total: 2.9 g/dL (ref 1.5–4.5)
Glucose: 235 mg/dL — ABNORMAL HIGH (ref 65–99)
Potassium: 4.5 mmol/L (ref 3.5–5.2)
Sodium: 142 mmol/L (ref 134–144)
Total Protein: 7.2 g/dL (ref 6.0–8.5)

## 2019-10-28 LAB — MICROALBUMIN / CREATININE URINE RATIO
Creatinine, Urine: 63.3 mg/dL
Microalb/Creat Ratio: 6 mg/g creat (ref 0–29)
Microalbumin, Urine: 3.9 ug/mL

## 2019-11-13 ENCOUNTER — Other Ambulatory Visit: Payer: Self-pay | Admitting: Family Medicine

## 2019-11-16 ENCOUNTER — Telehealth: Payer: Self-pay | Admitting: Family Medicine

## 2019-11-16 ENCOUNTER — Encounter: Payer: Self-pay | Admitting: Internal Medicine

## 2019-11-16 MED ORDER — ONETOUCH VERIO VI STRP: ORAL_STRIP | 0 refills | Status: DC

## 2019-11-16 NOTE — Telephone Encounter (Signed)
Test strips sent

## 2019-11-16 NOTE — Telephone Encounter (Signed)
  Prescription Request  11/16/2019  What is the name of the medication or equipment? One touch test strips  Have you contacted your pharmacy to request a refill? (if applicable) yes  Which pharmacy would you like this sent to? [facility] [place]    Patient notified that their request is being sent to the clinical staff for review and that they should receive a response within 2 business days.

## 2019-11-22 ENCOUNTER — Other Ambulatory Visit: Payer: Self-pay | Admitting: Family Medicine

## 2019-11-22 DIAGNOSIS — K219 Gastro-esophageal reflux disease without esophagitis: Secondary | ICD-10-CM

## 2019-11-23 ENCOUNTER — Encounter: Payer: Self-pay | Admitting: Family

## 2019-11-23 ENCOUNTER — Ambulatory Visit (INDEPENDENT_AMBULATORY_CARE_PROVIDER_SITE_OTHER): Payer: Medicare Other | Admitting: Family

## 2019-11-23 DIAGNOSIS — K5000 Crohn's disease of small intestine without complications: Secondary | ICD-10-CM

## 2019-11-23 DIAGNOSIS — M5441 Lumbago with sciatica, right side: Secondary | ICD-10-CM

## 2019-11-23 MED ORDER — GABAPENTIN 100 MG PO CAPS: 100.0000 mg | ORAL_CAPSULE | Freq: Three times a day (TID) | ORAL | 0 refills | Status: DC | PRN

## 2019-11-23 MED ORDER — BACLOFEN 10 MG PO TABS: 10.0000 mg | ORAL_TABLET | Freq: Three times a day (TID) | ORAL | 0 refills | Status: DC

## 2019-11-23 NOTE — Progress Notes (Signed)
   Virtual Visit via telephone Note Due to COVID-19 pandemic this visit was conducted virtually. This visit type was conducted due to national recommendations for restrictions regarding the COVID-19 Pandemic (e.g. social distancing, sheltering in place) in an effort to limit this patient's exposure and mitigate transmission in our community. All issues noted in this document were discussed and addressed.  A physical exam was not performed with this format.  I connected with Felicia Pugh on 11/23/19 at 8:34 AM by telephone and verified that I am speaking with the correct person using two identifiers. Felicia Pugh is currently located at home and no one is currently with her during visit. The provider, Evelina Dun, FNP is located in their office at time of visit.  I discussed the limitations, risks, security and privacy concerns of performing an evaluation and management service by telephone and the availability of in person appointments. I also discussed with the patient that there may be a patient responsible charge related to this service. The patient expressed understanding and agreed to proceed.   History and Present Illness:  Hip Pain  The incident occurred more than 1 week ago. There was no injury mechanism. The pain is present in the right hip. The quality of the pain is described as aching. The pain is at a severity of 8/10. The pain is moderate. Pertinent negatives include no loss of sensation or muscle weakness. She reports no foreign bodies present. The symptoms are aggravated by movement. She has tried rest and acetaminophen for the symptoms. The treatment provided mild relief.      Review of Systems  All other systems reviewed and are negative.    Observations/Objective: No SOB or distress noted   Assessment and Plan: 1. Acute right-sided low back pain with right-sided sciatica Pt can not have NSAIDs because of Crohn, we will hold off on prednisone given last A1C was  9.2. Start gabapentin as needed and baclofen, sedation precautions discussed RTO if pain continues or does not improve   - gabapentin (NEURONTIN) 100 MG capsule; Take 1 capsule (100 mg total) by mouth 3 (three) times daily as needed.  Dispense: 90 capsule; Refill: 0 - baclofen (LIORESAL) 10 MG tablet; Take 1 tablet (10 mg total) by mouth 3 (three) times daily.  Dispense: 30 each; Refill: 0  2. Crohn's disease of small intestine without complication ([place])     I discussed the assessment and treatment plan with the patient. The patient was provided an opportunity to ask questions and all were answered. The patient agreed with the plan and demonstrated an understanding of the instructions.   The patient was advised to call back or seek an in-person evaluation if the symptoms worsen or if the condition fails to improve as anticipated.  The above assessment and management plan was discussed with the patient. The patient verbalized understanding of and has agreed to the management plan. Patient is aware to call the clinic if symptoms persist or worsen. Patient is aware when to return to the clinic for a follow-up visit. Patient educated on when it is appropriate to go to the emergency department.   Time call ended:  8:46 AM  I provided 12 minutes of non-face-to-face time during this encounter.    Evelina Dun, FNP

## 2019-12-03 ENCOUNTER — Telehealth: Payer: Self-pay | Admitting: Family Medicine

## 2019-12-03 NOTE — Telephone Encounter (Signed)
Pt called stating that she had a televisit with Christy on 11/23/19 and says Alyse Low called her in some medicine to help with pain. Pt says the medicine only helps her for about 3-4 hrs. Wants advice. Says pain is coming from her knees and shoulder blades. Pt also says she has trouble with her hands going numb.

## 2019-12-03 NOTE — Telephone Encounter (Signed)
Patient has a follow up appointment scheduled. 

## 2019-12-03 NOTE — Telephone Encounter (Signed)
Sounds like she needs a visit in person, we can talk about options or if it is her knees we could even talk about doing injections. Please schedule a visit as soon as possible for her. Caryl Pina, MD [facility] 12/03/2019, 10:30 AM

## 2019-12-20 ENCOUNTER — Ambulatory Visit: Admitting: Family Medicine

## 2019-12-21 ENCOUNTER — Other Ambulatory Visit: Payer: Self-pay

## 2019-12-21 ENCOUNTER — Ambulatory Visit (INDEPENDENT_AMBULATORY_CARE_PROVIDER_SITE_OTHER): Payer: Medicare Other | Admitting: Nurse Practitioner

## 2019-12-21 ENCOUNTER — Encounter: Payer: Self-pay | Admitting: Nurse Practitioner

## 2019-12-21 VITALS — BP 104/62 | HR 70 | Temp 97.6°F | Ht 63.0 in | Wt 174.8 lb

## 2019-12-21 DIAGNOSIS — Z794 Long term (current) use of insulin: Secondary | ICD-10-CM

## 2019-12-21 DIAGNOSIS — M25562 Pain in left knee: Secondary | ICD-10-CM | POA: Diagnosis not present

## 2019-12-21 DIAGNOSIS — E119 Type 2 diabetes mellitus without complications: Secondary | ICD-10-CM | POA: Diagnosis not present

## 2019-12-21 DIAGNOSIS — K5000 Crohn's disease of small intestine without complications: Secondary | ICD-10-CM | POA: Diagnosis not present

## 2019-12-21 DIAGNOSIS — E1169 Type 2 diabetes mellitus with other specified complication: Secondary | ICD-10-CM | POA: Diagnosis not present

## 2019-12-21 MED ORDER — BASAGLAR KWIKPEN 100 UNIT/ML ~~LOC~~ SOPN: 40.0000 [IU] | PEN_INJECTOR | Freq: Every day | SUBCUTANEOUS | 3 refills | Status: DC

## 2019-12-21 MED ORDER — METHYLPREDNISOLONE ACETATE 80 MG/ML IJ SUSP
80.0000 mg | Freq: Once | INTRAMUSCULAR | Status: AC
  Administered 2019-12-21: 80 mg via INTRA_ARTICULAR

## 2019-12-21 NOTE — Patient Instructions (Signed)

## 2019-12-21 NOTE — Assessment & Plan Note (Addendum)
Well managed on current medication no changes made. No new concerns or symptoms with diarrhea or abdominal pain,

## 2019-12-21 NOTE — Assessment & Plan Note (Signed)
Well controlled, medication refill sent to pharmacy. Continue to eat a healthy diet and excersise

## 2019-12-21 NOTE — Assessment & Plan Note (Signed)
patients knee pain is not well controlled, patient is experiencing sever pain and has used gabapentin and muscle relaxant in the past with no therapeutic effect. Patient will get a steroid shot in her knee today. Continue to rest joint, ice and compression to LFT knee,   Risk were discussed including, bleeding, infection; increase in sugars if diabetic, atrophy at sight of injection, and increased pain. After consent was obtained, using sterile technique the skin was prepped with betadine and alcohol. The joint was entered and Steroid 80 ml and 2 ml plain lidocaine was then injected and the needle withdrawn. Procedure was well tolerated. The patient is asked to continue to rest the joint for a few more days before resuming regular activities. It may be more painful for the first 1 to 2 days. Watch for fever, or increase swelling, or persistent pain in the joint. Call or return to clinic PRN if such symptoms occur or there is failure to improve as anticipated.

## 2019-12-21 NOTE — Assessment & Plan Note (Deleted)
patients knee pain is not well controlled, patient is experiencing sever pain and has used gabapentin and muscle relaxant in the past with no therapeutic effect. Patient will get a steroid shot in her knee today. Continue to rest joint, ice and compression to LFT knee,   Risk were discussed including, bleeding, infection; increase in sugars if diabetic, atrophy at sight of injection, and increased pain. After consent was obtained, using sterile technique the skin was prepped with betadine and alcohol. The joint was entered and Steroid 80 ml and 2 ml plain lidocaine was then injected and the needle withdrawn. Procedure was well tolerated. The patient is asked to continue to rest the joint for a few more days before resuming regular activities. It may be more painful for the first 1 to 2 days. Watch for fever, or increase swelling, or persistent pain in the joint. Call or return to clinic PRN if such symptoms occur or there is failure to improve as anticipated.

## 2019-12-21 NOTE — Progress Notes (Addendum)
Acute Office Visit  Subjective:    Patient ID: Felicia Pugh, female    DOB: June 14, 1946, 74 y.o.   MRN: [id number]  Chief Complaint  Patient presents with  . Back Pain    radiates down left leg to knee  . Groin Pain    left    Back Pain The current episode started more than 1 month ago. The problem occurs constantly. The problem has been gradually worsening since onset. The pain is present in the gluteal. The quality of the pain is described as aching and shooting. The pain radiates to the left knee. The pain is at a severity of 8/10. The pain is severe. The pain is the same all the time. The symptoms are aggravated by lying down. Stiffness is present in the morning. Associated symptoms include pelvic pain. Pertinent negatives include no chest pain. Risk factors: 3 back surgery. She has tried muscle relaxant for the symptoms. The treatment provided no relief.  Groin Pain The patient's primary symptoms include pelvic pain. Associated symptoms include back pain.   Joint Injection/Arthrocentesis  Date/Time: 12/21/2019 9:09 AM Performed by: Ivy Lynn, NP Authorized by: Ivy Lynn, NP  Indications: pain  Body area: knee Local anesthesia used: no  Anesthesia: Local anesthesia used: no  Sedation: Patient sedated: no  Needle size: 22 G Ultrasound guidance: no Approach: lateral Methylprednisolone amount: 80 mg Lidocaine 2% amount: 2 mL Patient tolerance: patient tolerated the procedure well with no immediate complications      Past Medical History:  Diagnosis Date  . 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 Cataract   . Complication of anesthesia    difficulty breathing after waking up from anesthesia  . Crohn's disease ([place])   . Diabetes mellitus without complication ([place])   . Diabetic neuropathy ([place])   . Hyperlipidemia   . Hypertension   . IBS (irritable bowel syndrome)   . Stroke [facility]) 2011  .  Syncope and collapse   . Thyroid nodule   . Ulcer     Past Surgical History:  Procedure Laterality Date  . ABDOMINAL HYSTERECTOMY     partial  . BACK SURGERY     x3 (Dr Loyola Mast, Dr Patrice Paradise)  . BREAST SURGERY Right 2002   cyst removed  . COLONOSCOPY  10/102006   RMR: normal  . COLONOSCOPY  09/06/2009   RMR: Suboptimal prep on the right side. Ulcers noted at cecum and in the descending colon. Terminal ileum could not be intubated. Ounces with chronic active colitis, question Crohn's  . COLONOSCOPY N/A 03/27/2015   Dr. Gala Romney: abnormal cecum and IC valve most consistent with IBD. TI intubated and appeared normal. Likely Crohn's disease. Patient denies NSAID use. No improvement with Lialda samples at time of initial diagnosis  . COLONOSCOPY N/A 05/15/2017   Dr. Gala Romney: ab normal IC valve, s/p biopsy with active ileitis, diverticulosis in sigmoid and descending colon. Non-bleeding internal hemorrhoids  . ESOPHAGOGASTRODUODENOSCOPY    . LAPAROSCOPIC APPENDECTOMY N/A 07/21/2016   Procedure: APPENDECTOMY LAPAROSCOPIC;  Surgeon: Aviva Signs, MD;  Location: [facility];  Service: General;  Laterality: N/A;  . Small bowel capsule endoscopy  06/10/2005   RMR: single erosion versus AVM, distal ileum  . Small bowel capsule endoscopy  prior to 05/2005   [place]: reported erosions/ulcerations per medical record. actual report unavailable.    Family History  Problem Relation Age of Onset  . Hypertension Mother   .  Stroke Mother   . Diabetes Father        w/ BKA  . Hypertension Father   . Asthma Father   . Cancer Sister   . Diabetes Sister        stomach  . Diabetes Brother   . Diabetes Sister   . Cancer Sister        breast  . Hypertension Sister   . Hypertension Daughter   . Hypertension Son   . Multiple sclerosis Son   . Colon cancer Neg Hx   . Inflammatory bowel disease Neg Hx     Social History   Socioeconomic History  . Marital status: Married    Spouse name: Herbie Baltimore  . Number of  children: 5  . Years of education: 9th grade-then got her GED at [employer]  . Highest education level: GED or equivalent  Occupational History  . Occupation: retired  Tobacco Use  . Smoking status: Never Smoker  . Smokeless tobacco: Never Used  Substance and Sexual Activity  . Alcohol use: No  . Drug use: No  . Sexual activity: Not Currently  Other Topics Concern  . Not on file  Social History Narrative  . Not on file   Social Determinants of Health   Financial Resource Strain: Low Risk   . Difficulty of Paying Living Expenses: Not hard at all  Food Insecurity: No Food Insecurity  . Worried About Charity fundraiser in the Last Year: Never true  . Ran Out of Food in the Last Year: Never true  Transportation Needs: No Transportation Needs  . Lack of Transportation (Medical): No  . Lack of Transportation (Non-Medical): No  Physical Activity: Inactive  . Days of Exercise per Week: 0 days  . Minutes of Exercise per Session: 0 min  Stress: No Stress Concern Present  . Feeling of Stress : Not at all  Social Connections: Not Isolated  . Frequency of Communication with Friends and Family: More than three times a week  . Frequency of Social Gatherings with Friends and Family: More than three times a week  . Attends Religious Services: More than 4 times per year  . Active Member of Clubs or Organizations: Yes  . Attends Archivist Meetings: More than 4 times per year  . Marital Status: Married  Human resources officer Violence: Not At Risk  . Fear of Current or Ex-Partner: No  . Emotionally Abused: No  . Physically Abused: No  . Sexually Abused: No    Outpatient Medications Prior to Visit  Medication Sig Dispense Refill  . APPLE CIDER VINEGAR PO Take by mouth daily.    [name] atorvastatin (LIPITOR) 40 MG tablet Take 1 tablet (40 mg total) by mouth daily. 90 tablet 3  . baclofen (LIORESAL) 10 MG tablet Take 1 tablet (10 mg total) by mouth 3 (three) times daily. 30 each 0  .  balsalazide (COLAZAL) 750 MG capsule Take 3 capsules (2,250 mg total) by mouth 3 (three) times daily. 810 capsule 0  . bismuth subsalicylate (PEPTO BISMOL) 262 MG/15ML suspension 1 tsp in the AM as needed    . cholecalciferol (VITAMIN D3) 25 MCG (1000 UNIT) tablet Take 1,000 Units by mouth daily.    [name] ELDERBERRY PO Take by mouth daily.    . Ferrous Gluconate (IRON 27 PO) Take by mouth daily.    [name] gabapentin (NEURONTIN) 100 MG capsule Take 1 capsule (100 mg total) by mouth 3 (three) times daily as needed. 90 capsule 0  .  glucose blood (ONETOUCH VERIO) test strip USE 1 STRIP TO CHECK GLUCOSE TWICE DAILY 200 each 0  . Insulin Syringe-Needle U-100 31G X 1/4" 1 ML MISC 1 Syringe by Does not apply route 2 (two) times daily. 60 each 6  . linagliptin (TRADJENTA) 5 MG TABS tablet Take 1 tablet (5 mg total) by mouth daily. 90 tablet 3  . loperamide (IMODIUM) 1 MG/5ML solution Take 1 mg by mouth as needed for diarrhea or loose stools.    [name] losartan-hydrochlorothiazide (HYZAAR) 100-25 MG tablet Take 1 tablet by mouth daily. No further refills will be given till you have your routine follow up 90 tablet 3  . therapeutic multivitamin-minerals (THERAGRAN-M) tablet Take 1 tablet by mouth daily.    . Turmeric (QC TUMERIC COMPLEX) 500 MG CAPS Take by mouth daily.    . vitamin C (ASCORBIC ACID) 250 MG tablet Take 250 mg by mouth daily.    . Insulin Glargine (BASAGLAR KWIKPEN) 100 UNIT/ML SOPN Inject 0.2-0.3 mLs (20-30 Units total) into the skin daily. (Patient taking differently: Inject 20 Units into the skin daily. ) 5 pen 3   No facility-administered medications prior to visit.    Allergies  Allergen Reactions  . Metformin And Related     Severe diarrhea  . Asa [Aspirin]     GI Dr does not want her to take due to hx ulcers  . Nsaids     GI Dr does not want her to take due to hx of ulcers  . Other Diarrhea    Steroids  . Tolmetin Other (See Comments)    GI Dr does not want her to take due to hx of  ulcers GI Dr does not want her to take due to hx of ulcers     Review of Systems  Constitutional: Negative for activity change.  HENT: Negative.   Respiratory: Positive for chest tightness. Negative for shortness of breath.   Cardiovascular: Negative for chest pain and palpitations.  Genitourinary: Positive for pelvic pain. Negative for difficulty urinating.  Musculoskeletal: Positive for back pain and joint swelling. Negative for arthralgias.  Skin: Negative for color change.  Psychiatric/Behavioral: The patient is not nervous/anxious.        Objective:     Physical Exam Constitutional:      Appearance: Normal appearance.  HENT:     Head: Normocephalic.  Eyes:     Conjunctiva/sclera: Conjunctivae normal.  Cardiovascular:     Rate and Rhythm: Normal rate.     Pulses: Normal pulses.     Heart sounds: Normal heart sounds.  Pulmonary:     Effort: Pulmonary effort is normal.     Breath sounds: Normal breath sounds.  Abdominal:     General: Bowel sounds are normal.  Musculoskeletal:        General: Tenderness present.     Cervical back: Neck supple.  Skin:    General: Skin is warm.     Findings: No rash.  Neurological:     Mental Status: She is alert and oriented to person, place, and time.  Psychiatric:        Mood and Affect: Mood normal.        Behavior: Behavior normal.     BP 104/62   Pulse 70   Temp 97.6 F (36.4 C)   Ht 5' 3"  (1.6 m)   Wt 174 lb 12.8 oz (79.3 kg)   SpO2 100%   BMI 30.96 kg/m  Wt Readings from Last 3 Encounters:  12/21/19  174 lb 12.8 oz (79.3 kg)  10/27/19 173 lb (78.5 kg)  06/30/19 171 lb 3.2 oz (77.7 kg)      Lab Results  Component Value Date   TSH 0.902 03/20/2017   Lab Results  Component Value Date   WBC 10.9 (H) 10/27/2019   HGB 11.8 10/27/2019   HCT 36.3 10/27/2019   MCV 87 10/27/2019   PLT 306 10/27/2019   Lab Results  Component Value Date   NA 142 10/27/2019   K 4.5 10/27/2019   CO2 26 10/27/2019   GLUCOSE  235 (H) 10/27/2019   BUN 16 10/27/2019   CREATININE 0.83 10/27/2019   BILITOT 0.4 10/27/2019   ALKPHOS 112 10/27/2019   AST 18 10/27/2019   ALT 15 10/27/2019   PROT 7.2 10/27/2019   ALBUMIN 4.3 10/27/2019   CALCIUM 9.7 10/27/2019   ANIONGAP 8 03/17/2017   Lab Results  Component Value Date   CHOL 174 10/27/2019   Lab Results  Component Value Date   HDL 79 10/27/2019   Lab Results  Component Value Date   LDLCALC 82 10/27/2019   Lab Results  Component Value Date   TRIG 67 10/27/2019   Lab Results  Component Value Date   CHOLHDL 2.2 10/27/2019   Lab Results  Component Value Date   HGBA1C 9.2 (H) 10/27/2019       Assessment & Plan:   Problem List Items Addressed This Visit      Digestive   Crohn's disease ([place])    Well managed on current medication no changes made. No new concerns or symptoms with diarrhea or abdominal pain,         Endocrine   Diabetes mellitus without complication ([facility]) - Primary   Relevant Medications   Insulin Glargine (BASAGLAR KWIKPEN) 100 UNIT/ML   Type 2 diabetes mellitus with other specified complication ([place])    Well controlled, medication refill sent to pharmacy. Continue to eat a healthy diet and excersise      Relevant Medications   Insulin Glargine (BASAGLAR KWIKPEN) 100 UNIT/ML     Other   Left knee pain    patients knee pain is not well controlled, patient is experiencing sever pain and has used gabapentin and muscle relaxant in the past with no therapeutic effect. Patient will get a steroid shot in her knee today. Continue to rest joint, ice and compression to LFT knee,   Risk were discussed including, bleeding, infection; increase in sugars if diabetic, atrophy at sight of injection, and increased pain. After consent was obtained, using sterile technique the skin was prepped with betadine and alcohol. The joint was entered and Steroid 80 ml and 2 ml plain lidocaine was then injected and the needle withdrawn. Procedure was  well tolerated. The patient is asked to continue to rest the joint for a few more days before resuming regular activities. It may be more painful for the first 1 to 2 days. Watch for fever, or increase swelling, or persistent pain in the joint. Call or return to clinic PRN if such symptoms occur or there is failure to improve as anticipated.      Relevant Orders   Joint Injection/Arthrocentesis (Completed)       Meds ordered this encounter  Medications  . Insulin Glargine (BASAGLAR KWIKPEN) 100 UNIT/ML    Sig: Inject 0.4 mLs (40 Units total) into the skin daily.    Dispense:  5 pen    Refill:  3    Order Specific Question:  Supervising Provider    Answer:   Caryl Pina A [[id number]]  . methylPREDNISolone acetate (DEPO-MEDROL) injection 80 mg     Ivy Lynn, NP

## 2019-12-28 ENCOUNTER — Ambulatory Visit: Payer: Medicare HMO | Admitting: Nurse Practitioner

## 2019-12-29 ENCOUNTER — Encounter: Payer: Self-pay | Admitting: Nurse Practitioner

## 2019-12-29 ENCOUNTER — Ambulatory Visit (INDEPENDENT_AMBULATORY_CARE_PROVIDER_SITE_OTHER): Payer: Medicare Other | Admitting: Nurse Practitioner

## 2019-12-29 ENCOUNTER — Other Ambulatory Visit: Payer: Self-pay

## 2019-12-29 VITALS — BP 110/72 | HR 91 | Temp 97.1°F | Ht 62.5 in | Wt 171.4 lb

## 2019-12-29 DIAGNOSIS — K529 Noninfective gastroenteritis and colitis, unspecified: Secondary | ICD-10-CM

## 2019-12-29 DIAGNOSIS — K50919 Crohn's disease, unspecified, with unspecified complications: Secondary | ICD-10-CM | POA: Diagnosis not present

## 2019-12-29 DIAGNOSIS — K219 Gastro-esophageal reflux disease without esophagitis: Secondary | ICD-10-CM

## 2019-12-29 DIAGNOSIS — D638 Anemia in other chronic diseases classified elsewhere: Secondary | ICD-10-CM

## 2019-12-29 MED ORDER — BALSALAZIDE DISODIUM 750 MG PO CAPS: 2250.0000 mg | ORAL_CAPSULE | Freq: Three times a day (TID) | ORAL | 3 refills | Status: DC

## 2019-12-29 NOTE — Assessment & Plan Note (Signed)
The patient has chronic Crohn's disease.  Insurance initially would not pay for Lialda and would only cover Colazal.  She has been doing quite well on this for some time.  She is due for updated labs and I will check a CBC and CMP that she can have drawn at her convenience.  Denies overt GI complaints.  Diarrhea much improved, typically uses Imodium if she is going to be on the road/out and about which generally occurs about 3 times a week.  Otherwise doing just fine.  Her LFTs were previously normalized.  Iron normal, mildly elevated ferritin.  She is on oral iron daily.  I do not feel, given her normalized hemoglobin and normal/mildly elevated iron studies, that she needs to continue her oral iron at this time.  However, I will defer to primary care who started her on this.  Follow-up in 6 months.

## 2019-12-29 NOTE — Progress Notes (Signed)
Referring Provider: Dettinger, Fransisca Kaufmann, MD Primary Care Physician:  Dettinger, Fransisca Kaufmann, MD Primary GI:  Dr. Gala Romney  Chief Complaint  Patient presents with  . Crohn's Disease    f/u  . Diarrhea    f/u. improved. takes imodium about 3 times per week    HPI:   Felicia Pugh is a 74 y.o. female who presents for follow-up on Chron's and diarrhea. The patient was last seen in our office 2018-08-29 for the same as well as anemia of chronic disease. Noted chronic history of diarrhea and Crohn's disease, elevated alk phos. Colonoscopy up-to-date 2018 with active ileitis otherwise normal. LFTs normalized including alkaline phosphatase and GGT. Insurance initially would not approve Lialda but did cover Colazal. 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 Imodium helps with symptoms as well. Most recent CMP on 01/26/2019 was essentially normal.  At her last visit still on Colazal without issues. Sometimes will only take 2 pills rather than three. Imodium as needed. Diarrhea flares are typically from dietary indiscretions, but overall much better. No recent check of iron levels. No other overt GI complaints. Recommended continue current medications, updated labs related to anemia, continue balsalazide (Colazal), notify us of any worsening diarrhea, pain, blood. Avoid NSAIDs. Follow-up in 6 months.  Labs completed 06/30/2019 which found normal iron at 79, mildly elevated ferritin at 422.  Today she states she's doing ok overall. Needs a refill of Colazal. Diarrhea doing well, uses Immodium as needed (about 3 times a week); generally only takes it if she's going to be on the road.  Denies abdominal pain, N/V, hematochezia. She is having dark stools on iron. Denies fever, chills, unintentional weight loss. Denies URI or flu-like symptoms. Denies loss of sense of taste or smell. The patient has received COVID-19 vaccination(s). Denies chest pain, dyspnea, dizziness, lightheadedness, syncope, near  syncope. Denies any other upper or lower GI symptoms.  Past Medical History:  Diagnosis Date  . 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 Cataract   . Complication of anesthesia    difficulty breathing after waking up from anesthesia  . Crohn's disease ([place])   . Diabetes mellitus without complication ([place])   . Diabetic neuropathy ([place])   . Hyperlipidemia   . Hypertension   . IBS (irritable bowel syndrome)   . Stroke [facility] 2011  . Syncope and collapse   . Thyroid nodule   . Ulcer     Past Surgical History:  Procedure Laterality Date  . ABDOMINAL HYSTERECTOMY     partial  . BACK SURGERY     x3 (Dr Loyola Mast, Dr Patrice Paradise)  . BREAST SURGERY Right 2002   cyst removed  . COLONOSCOPY  10/102006   RMR: normal  . COLONOSCOPY  09/06/2009   RMR: Suboptimal prep on the right side. Ulcers noted at cecum and in the descending colon. Terminal ileum could not be intubated. Ounces with chronic active colitis, question Crohn's  . COLONOSCOPY N/A 03/27/2015   Dr. Gala Romney: abnormal cecum and IC valve most consistent with IBD. TI intubated and appeared normal. Likely Crohn's disease. Patient denies NSAID use. No improvement with Lialda samples at time of initial diagnosis  . COLONOSCOPY N/A 05/15/2017   Dr. Gala Romney: ab normal IC valve, s/p biopsy with active ileitis, diverticulosis in sigmoid and descending colon. Non-bleeding internal hemorrhoids  . ESOPHAGOGASTRODUODENOSCOPY    . LAPAROSCOPIC APPENDECTOMY N/A 07/21/2016   Procedure: APPENDECTOMY LAPAROSCOPIC;  Surgeon: Aviva Signs, MD;  Location: [facility];  Service: General;  Laterality: N/A;  . Small bowel capsule endoscopy  06/10/2005   RMR: single erosion versus AVM, distal ileum  . Small bowel capsule endoscopy  prior to 05/2005   [place]: reported erosions/ulcerations per medical record. actual report unavailable.    Current Outpatient Medications  Medication Sig Dispense Refill  . APPLE CIDER  VINEGAR PO Take by mouth daily.    [name] atorvastatin (LIPITOR) 40 MG tablet Take 1 tablet (40 mg total) by mouth daily. 90 tablet 3  . baclofen (LIORESAL) 10 MG tablet Take 1 tablet (10 mg total) by mouth 3 (three) times daily. 30 each 0  . balsalazide (COLAZAL) 750 MG capsule Take 3 capsules (2,250 mg total) by mouth 3 (three) times daily. 810 capsule 3  . cholecalciferol (VITAMIN D3) 25 MCG (1000 UNIT) tablet Take 1,000 Units by mouth daily.    [name] ELDERBERRY PO Take by mouth daily.    . Ferrous Gluconate (IRON 27 PO) Take by mouth daily.    [name] gabapentin (NEURONTIN) 100 MG capsule Take 1 capsule (100 mg total) by mouth 3 (three) times daily as needed. 90 capsule 0  . glucose blood (ONETOUCH VERIO) test strip USE 1 STRIP TO CHECK GLUCOSE TWICE DAILY 200 each 0  . Insulin Glargine (BASAGLAR KWIKPEN) 100 UNIT/ML Inject 0.4 mLs (40 Units total) into the skin daily. 5 pen 3  . Insulin Syringe-Needle U-100 31G X 1/4" 1 ML MISC 1 Syringe by Does not apply route 2 (two) times daily. 60 each 6  . linagliptin (TRADJENTA) 5 MG TABS tablet Take 1 tablet (5 mg total) by mouth daily. 90 tablet 3  . loperamide (IMODIUM) 1 MG/5ML solution Take 1 mg by mouth as needed for diarrhea or loose stools.    [name] losartan-hydrochlorothiazide (HYZAAR) 100-25 MG tablet Take 1 tablet by mouth daily. No further refills will be given till you have your routine follow up 90 tablet 3  . therapeutic multivitamin-minerals (THERAGRAN-M) tablet Take 1 tablet by mouth daily.    . Turmeric (QC TUMERIC COMPLEX) 500 MG CAPS Take by mouth daily.    . vitamin C (ASCORBIC ACID) 250 MG tablet Take 250 mg by mouth daily.     No current facility-administered medications for this visit.    Allergies as of 12/29/2019 - Review Complete 12/29/2019  Allergen Reaction Noted  . Metformin and related  05/11/2014  . Asa [aspirin]  11/04/2012  . Nsaids  11/04/2012  . Other Diarrhea 12/25/2014  . Tolmetin Other (See Comments) 11/04/2012    Family  History  Problem Relation Age of Onset  . Hypertension Mother   . Stroke Mother   . Diabetes Father        w/ BKA  . Hypertension Father   . Asthma Father   . Cancer Sister   . Diabetes Sister        stomach  . Diabetes Brother   . Diabetes Sister   . Cancer Sister        breast  . Hypertension Sister   . Hypertension Daughter   . Hypertension Son   . Multiple sclerosis Son   . Colon cancer Neg Hx   . Inflammatory bowel disease Neg Hx     Social History   Socioeconomic History  . Marital status: Married    Spouse name: Herbie Baltimore  . Number of children: 5  . Years of education: 9th grade-then got her GED at [employer]  . Highest  education level: GED or equivalent  Occupational History  . Occupation: retired  Tobacco Use  . Smoking status: Never Smoker  . Smokeless tobacco: Never Used  Substance and Sexual Activity  . Alcohol use: No  . Drug use: No  . Sexual activity: Not Currently  Other Topics Concern  . Not on file  Social History Narrative  . Not on file   Social Determinants of Health   Financial Resource Strain: Low Risk   . Difficulty of Paying Living Expenses: Not hard at all  Food Insecurity: No Food Insecurity  . Worried About Charity fundraiser in the Last Year: Never true  . Ran Out of Food in the Last Year: Never true  Transportation Needs: No Transportation Needs  . Lack of Transportation (Medical): No  . Lack of Transportation (Non-Medical): No  Physical Activity: Inactive  . Days of Exercise per Week: 0 days  . Minutes of Exercise per Session: 0 min  Stress: No Stress Concern Present  . Feeling of Stress : Not at all  Social Connections: Not Isolated  . Frequency of Communication with Friends and Family: More than three times a week  . Frequency of Social Gatherings with Friends and Family: More than three times a week  . Attends Religious Services: More than 4 times per year  . Active Member of Clubs or Organizations: Yes  . Attends Theatre manager Meetings: More than 4 times per year  . Marital Status: Married    Subjective: Review of Systems  Constitutional: Negative for chills, fever, malaise/fatigue and weight loss.  HENT: Negative for congestion and sore throat.   Respiratory: Negative for cough and shortness of breath.   Cardiovascular: Negative for chest pain and palpitations.  Gastrointestinal: Negative for abdominal pain, blood in stool, diarrhea, melena, nausea and vomiting.  Musculoskeletal: Negative for joint pain and myalgias.  Skin: Negative for rash.  Neurological: Negative for dizziness and weakness.  Endo/Heme/Allergies: Does not bruise/bleed easily.  Psychiatric/Behavioral: Negative for depression. The patient is not nervous/anxious.   All other systems reviewed and are negative.    Objective: BP 110/72   Pulse 91   Temp (!) 97.1 F (36.2 C) (Oral)   Ht 5' 2.5" (1.588 m)   Wt 171 lb 6.4 oz (77.7 kg)   BMI 30.85 kg/m  Physical Exam    12/29/2019 2:44 PM   Disclaimer: This note was dictated with voice recognition software. Similar sounding words can inadvertently be transcribed and may not be corrected upon review.

## 2019-12-29 NOTE — Assessment & Plan Note (Signed)
Chronic diarrhea doing well, uses Imodium as needed.  No ongoing or persistent symptoms.  Recommend she continue her current medications and follow-up in 6 months.

## 2019-12-29 NOTE — Assessment & Plan Note (Signed)
Anemia of chronic disease and apparently on oral iron by primary care.  Her iron studies have been normal so for some time.  Her ferritin was just recently mildly elevated.  I feel she could likely come off the iron but I will defer to primary care who is managing this.  I recommended that she bring it up with them as well.  Follow-up in 6 months.  Recheck labs.  Call for any worsening symptoms.

## 2019-12-29 NOTE — Patient Instructions (Signed)
Your health issues we discussed today were:   Anemia: 1. As we discussed, your iron studies are normal 2. I would discuss with primary care about stopping your oral iron and see if they are okay with this 3. Call us if you have any obvious bleeding  Crohn's disease: 1. As requested, I sent a refill of Colazal (balsalazide) to your pharmacy 2. Continue to take your current medication as you have been 3. Have your labs drawn when you are able to 4. Call us for any worsening or recurrent symptoms  Chronic diarrhea: 1. I am glad your diarrhea is improved and significantly more manageable 2. Continue using your current medications including Imodium as needed 3. Call us for any worsening or severe symptoms  Overall I recommend:  1. Continue your other current medications 2. Return for follow-up in 6 months 3. Call us if you have any questions or concerns   ---------------------------------------------------------------  I am glad you have gotten your COVID-19 vaccination!  Even though you are fully vaccinated you should continue to follow CDC and state/local guidelines.  ---------------------------------------------------------------   At [facility] we value your feedback. You may receive a survey about your visit today. Please share your experience as we strive to create trusting relationships with our patients to provide genuine, compassionate, quality care.  We appreciate your understanding and patience as we review any laboratory studies, imaging, and other diagnostic tests that are ordered as we care for you. Our office policy is 5 business days for review of these results, and any emergent or urgent results are addressed in a timely manner for your best interest. If you do not hear from our office in 1 week, please contact us.   We also encourage the use of MyChart, which contains your medical information for your review as well. If you are not enrolled in this  feature, an access code is on this after visit summary for your convenience. Thank you for allowing [place] to be involved in your care.  It was great to see you today!  I hope you have a great Summer!!

## 2019-12-30 LAB — COMPREHENSIVE METABOLIC PANEL
AG Ratio: 1.4 (calc) (ref 1.0–2.5)
ALT: 13 U/L (ref 6–29)
AST: 18 U/L (ref 10–35)
Albumin: 4 g/dL (ref 3.6–5.1)
Alkaline phosphatase (APISO): 103 U/L (ref 37–153)
BUN: 17 mg/dL (ref 7–25)
CO2: 30 mmol/L (ref 20–32)
Calcium: 10 mg/dL (ref 8.6–10.4)
Chloride: 102 mmol/L (ref 98–110)
Creat: 0.69 mg/dL (ref 0.60–0.93)
Globulin: 2.8 g/dL (calc) (ref 1.9–3.7)
Glucose, Bld: 162 mg/dL — ABNORMAL HIGH (ref 65–139)
Potassium: 4.2 mmol/L (ref 3.5–5.3)
Sodium: 141 mmol/L (ref 135–146)
Total Bilirubin: 0.5 mg/dL (ref 0.2–1.2)
Total Protein: 6.8 g/dL (ref 6.1–8.1)

## 2019-12-30 LAB — CBC WITH DIFFERENTIAL/PLATELET
Absolute Monocytes: 893 cells/uL (ref 200–950)
Basophils Absolute: 58 cells/uL (ref 0–200)
Basophils Relative: 0.5 %
Eosinophils Absolute: 162 cells/uL (ref 15–500)
Eosinophils Relative: 1.4 %
HCT: 35.4 % (ref 35.0–45.0)
Hemoglobin: 11.3 g/dL — ABNORMAL LOW (ref 11.7–15.5)
Lymphs Abs: 2320 cells/uL (ref 850–3900)
MCH: 28.3 pg (ref 27.0–33.0)
MCHC: 31.9 g/dL — ABNORMAL LOW (ref 32.0–36.0)
MCV: 88.7 fL (ref 80.0–100.0)
MPV: 9.7 fL (ref 7.5–12.5)
Monocytes Relative: 7.7 %
Neutro Abs: 8166 cells/uL — ABNORMAL HIGH (ref 1500–7800)
Neutrophils Relative %: 70.4 %
Platelets: 313 10*3/uL (ref 140–400)
RBC: 3.99 10*6/uL (ref 3.80–5.10)
RDW: 12.4 % (ref 11.0–15.0)
Total Lymphocyte: 20 %
WBC: 11.6 10*3/uL — ABNORMAL HIGH (ref 3.8–10.8)

## 2020-01-17 ENCOUNTER — Other Ambulatory Visit: Payer: Self-pay | Admitting: Family Medicine

## 2020-01-17 DIAGNOSIS — E119 Type 2 diabetes mellitus without complications: Secondary | ICD-10-CM

## 2020-01-19 ENCOUNTER — Other Ambulatory Visit: Payer: Self-pay | Admitting: Nurse Practitioner

## 2020-01-19 DIAGNOSIS — K5000 Crohn's disease of small intestine without complications: Secondary | ICD-10-CM

## 2020-01-20 ENCOUNTER — Other Ambulatory Visit: Payer: Self-pay

## 2020-01-20 DIAGNOSIS — K5000 Crohn's disease of small intestine without complications: Secondary | ICD-10-CM

## 2020-01-21 ENCOUNTER — Other Ambulatory Visit: Payer: Self-pay | Admitting: Family Medicine

## 2020-01-21 DIAGNOSIS — I152 Hypertension secondary to endocrine disorders: Secondary | ICD-10-CM

## 2020-01-28 ENCOUNTER — Other Ambulatory Visit: Payer: Self-pay

## 2020-01-28 ENCOUNTER — Encounter: Payer: Self-pay | Admitting: Family Medicine

## 2020-01-28 ENCOUNTER — Ambulatory Visit (INDEPENDENT_AMBULATORY_CARE_PROVIDER_SITE_OTHER): Payer: Medicare Other | Admitting: Family Medicine

## 2020-01-28 VITALS — BP 122/73 | HR 80 | Temp 98.7°F | Ht 62.5 in | Wt 171.0 lb

## 2020-01-28 DIAGNOSIS — Z794 Long term (current) use of insulin: Secondary | ICD-10-CM

## 2020-01-28 DIAGNOSIS — E785 Hyperlipidemia, unspecified: Secondary | ICD-10-CM

## 2020-01-28 DIAGNOSIS — E1169 Type 2 diabetes mellitus with other specified complication: Secondary | ICD-10-CM

## 2020-01-28 DIAGNOSIS — I10 Essential (primary) hypertension: Secondary | ICD-10-CM | POA: Diagnosis not present

## 2020-01-28 DIAGNOSIS — I152 Hypertension secondary to endocrine disorders: Secondary | ICD-10-CM

## 2020-01-28 DIAGNOSIS — E1159 Type 2 diabetes mellitus with other circulatory complications: Secondary | ICD-10-CM

## 2020-01-28 LAB — BAYER DCA HB A1C WAIVED: HB A1C (BAYER DCA - WAIVED): 9.2 % — ABNORMAL HIGH (ref <7.0)

## 2020-01-28 MED ORDER — DAPAGLIFLOZIN PROPANEDIOL 10 MG PO TABS: 10.0000 mg | ORAL_TABLET | Freq: Every day | ORAL | 3 refills | Status: DC

## 2020-01-28 MED ORDER — TRESIBA FLEXTOUCH 100 UNIT/ML ~~LOC~~ SOPN: 40.0000 [IU] | PEN_INJECTOR | Freq: Every day | SUBCUTANEOUS | 3 refills | Status: DC

## 2020-01-28 NOTE — Progress Notes (Signed)
BP 122/73   Pulse 80   Temp 98.7 F (37.1 C)   Ht 5' 2.5" (1.588 m)   Wt 171 lb (77.6 kg)   SpO2 99%   BMI 30.78 kg/m    Subjective:   Patient ID: Felicia Pugh, female    DOB: 06/01/1946, 74 y.o.   MRN: [id number]  HPI: Felicia Pugh is a 74 y.o. female presenting on 01/28/2020 for Medical Management of Chronic Issues and Diabetes   HPI Type 2 diabetes mellitus Patient comes in today for recheck of his diabetes. Patient has been currently taking Psychologist, forensic. Patient is currently on an ACE inhibitor/ARB. Patient has not seen an ophthalmologist this year. Patient denies any issues with their feet. The symptom started onset as an adult hypertension and cholesterol ARE RELATED TO DM.  A1c is 9.2  Hypertension Patient is currently on losartan hydrochlorothiazide, and their blood pressure today is 122/73. Patient denies any lightheadedness or dizziness. Patient denies headaches, blurred vision, chest pains, shortness of breath, or weakness. Denies any side effects from medication and is content with current medication.   Hyperlipidemia Patient is coming in for recheck of his hyperlipidemia. The patient is currently taking atorvastatin. They deny any issues with myalgias or history of liver damage from it. They deny any focal numbness or weakness or chest pain.   Relevant past medical, surgical, family and social history reviewed and updated as indicated. Interim medical history since our last visit reviewed. Allergies and medications reviewed and updated.  Review of Systems  Constitutional: Negative for chills and fever.  Eyes: Negative for visual disturbance.  Respiratory: Negative for chest tightness and shortness of breath.   Cardiovascular: Negative for chest pain and leg swelling.  Musculoskeletal: Negative for back pain and gait problem.  Skin: Negative for rash.  Neurological: Negative for light-headedness and headaches.  Psychiatric/Behavioral: Negative for  agitation, behavioral problems, dysphoric mood, self-injury, sleep disturbance and suicidal ideas. The patient is not nervous/anxious.   All other systems reviewed and are negative.   Per HPI unless specifically indicated above   Allergies as of 01/28/2020      Reactions   Metformin And Related    Severe diarrhea   Asa [aspirin]    GI Dr does not want her to take due to hx ulcers   Nsaids    GI Dr does not want her to take due to hx of ulcers   Other Diarrhea   Steroids   Tolmetin Other (See Comments)   GI Dr does not want her to take due to hx of ulcers GI Dr does not want her to take due to hx of ulcers      Medication List       Accurate as of January 28, 2020  9:59 AM. If you have any questions, ask your nurse or doctor.        STOP taking these medications   APPLE CIDER VINEGAR PO Stopped by: Fransisca Kaufmann Shellia Hartl, MD   baclofen 10 MG tablet Commonly known as: LIORESAL Stopped by: Fransisca Kaufmann Armetta Henri, MD     TAKE these medications   atorvastatin 40 MG tablet Commonly known as: LIPITOR Take 1 tablet (40 mg total) by mouth daily.   balsalazide 750 MG capsule Commonly known as: COLAZAL Take 3 capsules (2,250 mg total) by mouth 3 (three) times daily.   Basaglar KwikPen 100 UNIT/ML INJECT 20 TO 30 UNITS INTO THE SKIN DAILY   cholecalciferol 25 MCG (1000 UNIT) tablet Commonly known  as: VITAMIN D3 Take 1,000 Units by mouth daily.   dapagliflozin propanediol 10 MG Tabs tablet Commonly known as: Farxiga Take 1 tablet (10 mg total) by mouth daily before breakfast. Hold for now until patient authorizes to fill Started by: Fransisca Kaufmann Coleson Kant, MD   ELDERBERRY PO Take by mouth daily.   gabapentin 100 MG capsule Commonly known as: NEURONTIN Take 1 capsule (100 mg total) by mouth 3 (three) times daily as needed.   Insulin Syringe-Needle U-100 31G X 1/4" 1 ML Misc 1 Syringe by Does not apply route 2 (two) times daily.   IRON 27 PO Take by mouth daily.   linagliptin  5 MG Tabs tablet Commonly known as: TRADJENTA Take 1 tablet (5 mg total) by mouth daily.   loperamide 1 MG/5ML solution Commonly known as: IMODIUM Take 1 mg by mouth as needed for diarrhea or loose stools.   losartan-hydrochlorothiazide 100-25 MG tablet Commonly known as: HYZAAR Take 1 tablet by mouth daily.   OneTouch Verio test strip Generic drug: glucose blood USE 1 STRIP TO CHECK GLUCOSE TWICE DAILY   QC Tumeric Complex 500 MG Caps Generic drug: Turmeric Take by mouth daily.   therapeutic multivitamin-minerals tablet Take 1 tablet by mouth daily.   Tyler Aas FlexTouch 100 UNIT/ML FlexTouch Pen Generic drug: insulin degludec Inject 0.4 mLs (40 Units total) into the skin daily. Hold for now until patient authorizes to fill Started by: Worthy Rancher, MD   vitamin C 250 MG tablet Commonly known as: ASCORBIC ACID Take 250 mg by mouth daily.        Objective:   BP 122/73   Pulse 80   Temp 98.7 F (37.1 C)   Ht 5' 2.5" (1.588 m)   Wt 171 lb (77.6 kg)   SpO2 99%   BMI 30.78 kg/m   Wt Readings from Last 3 Encounters:  01/28/20 171 lb (77.6 kg)  12/29/19 171 lb 6.4 oz (77.7 kg)  12/21/19 174 lb 12.8 oz (79.3 kg)    Physical Exam Vitals and nursing note reviewed.  Constitutional:      General: She is not in acute distress.    Appearance: She is well-developed. She is not diaphoretic.  Eyes:     Conjunctiva/sclera: Conjunctivae normal.  Cardiovascular:     Rate and Rhythm: Normal rate and regular rhythm.     Heart sounds: Normal heart sounds. No murmur heard.   Pulmonary:     Effort: Pulmonary effort is normal. No respiratory distress.     Breath sounds: Normal breath sounds. No wheezing.  Musculoskeletal:        General: No tenderness. Normal range of motion.  Skin:    General: Skin is warm and dry.     Findings: No rash.  Neurological:     Mental Status: She is alert and oriented to person, place, and time.     Coordination: Coordination normal.    Psychiatric:        Behavior: Behavior normal.       Assessment & Plan:   Problem List Items Addressed This Visit      Cardiovascular and Mediastinum   Hypertension associated with diabetes ([place])   Relevant Medications   dapagliflozin propanediol (FARXIGA) 10 MG TABS tablet   insulin degludec (TRESIBA FLEXTOUCH) 100 UNIT/ML FlexTouch Pen     Endocrine   Hyperlipidemia associated with type 2 diabetes mellitus (HCC)   Relevant Medications   dapagliflozin propanediol (FARXIGA) 10 MG TABS tablet   insulin degludec (TRESIBA FLEXTOUCH)  100 UNIT/ML FlexTouch Pen   Type 2 diabetes mellitus with other specified complication (HCC) - Primary   Relevant Medications   dapagliflozin propanediol (FARXIGA) 10 MG TABS tablet   insulin degludec (TRESIBA FLEXTOUCH) 100 UNIT/ML FlexTouch Pen   Other Relevant Orders   Bayer DCA Hb A1c Waived      Will give samples of Farxiga and Tresiba, will do the [place] instead of [employer] and the [place] will do with her Tradjenta if goes well then she can fill prescriptions for both.  Also discussed going to see clinical pharmacist and she will decide whether or not she makes an appointment. Follow up plan: Return in about 3 months (around 04/29/2020), or if symptoms worsen or fail to improve, for Diabetes and hypertension and cholesterol.  Counseling provided for all of the vaccine components Orders Placed This Encounter  Procedures  . [facility] Hb A1c Sullivan, MD [facility] 01/28/2020, 9:59 AM

## 2020-02-03 ENCOUNTER — Ambulatory Visit: Payer: Medicare Other | Admitting: Pharmacist

## 2020-02-03 DIAGNOSIS — K5000 Crohn's disease of small intestine without complications: Secondary | ICD-10-CM | POA: Diagnosis not present

## 2020-02-03 LAB — CBC WITH DIFFERENTIAL/PLATELET
Absolute Monocytes: 620 cells/uL (ref 200–950)
Basophils Absolute: 47 cells/uL (ref 0–200)
Basophils Relative: 0.5 %
Eosinophils Absolute: 150 cells/uL (ref 15–500)
Eosinophils Relative: 1.6 %
HCT: 34 % — ABNORMAL LOW (ref 35.0–45.0)
Hemoglobin: 10.8 g/dL — ABNORMAL LOW (ref 11.7–15.5)
Lymphs Abs: 1654 cells/uL (ref 850–3900)
MCH: 28.3 pg (ref 27.0–33.0)
MCHC: 31.8 g/dL — ABNORMAL LOW (ref 32.0–36.0)
MCV: 89 fL (ref 80.0–100.0)
MPV: 9.8 fL (ref 7.5–12.5)
Monocytes Relative: 6.6 %
Neutro Abs: 6928 cells/uL (ref 1500–7800)
Neutrophils Relative %: 73.7 %
Platelets: 310 10*3/uL (ref 140–400)
RBC: 3.82 10*6/uL (ref 3.80–5.10)
RDW: 12.2 % (ref 11.0–15.0)
Total Lymphocyte: 17.6 %
WBC: 9.4 10*3/uL (ref 3.8–10.8)

## 2020-02-04 ENCOUNTER — Ambulatory Visit: Payer: Medicare Other | Admitting: Pharmacist

## 2020-02-10 ENCOUNTER — Other Ambulatory Visit: Payer: Self-pay | Admitting: Family Medicine

## 2020-02-10 DIAGNOSIS — E1169 Type 2 diabetes mellitus with other specified complication: Secondary | ICD-10-CM

## 2020-02-10 DIAGNOSIS — L7211 Pilar cyst: Secondary | ICD-10-CM | POA: Diagnosis not present

## 2020-02-17 ENCOUNTER — Telehealth: Payer: Self-pay | Admitting: Family Medicine

## 2020-02-17 NOTE — Telephone Encounter (Signed)
She will follow provider's suggestions.

## 2020-02-17 NOTE — Telephone Encounter (Signed)
Pt states that either the tresiba or farxiga is causing her to feel nauseated and having diarrhea. She is wanting to know if she can stop the medication. Please advise.

## 2020-02-17 NOTE — Telephone Encounter (Signed)
Suggestions please?

## 2020-02-17 NOTE — Telephone Encounter (Signed)
Neither 1 of these should cause nausea or vomiting because they do not work through the stomach but if she wants to, stop 1 for 1 week see if it improves and if it does not then start that one back and stop the other 1 for 1 week and then let me know in 2 weeks which 1 was the culprit if it was or if it was just her stomach causing issues.

## 2020-03-07 ENCOUNTER — Other Ambulatory Visit: Payer: Self-pay | Admitting: Family Medicine

## 2020-03-07 DIAGNOSIS — E1169 Type 2 diabetes mellitus with other specified complication: Secondary | ICD-10-CM

## 2020-03-07 DIAGNOSIS — Z794 Long term (current) use of insulin: Secondary | ICD-10-CM

## 2020-03-30 ENCOUNTER — Telehealth: Payer: Self-pay | Admitting: Internal Medicine

## 2020-03-30 NOTE — Telephone Encounter (Signed)
Pt returned call and isn't sure if medication is covered under insurance or not. Pt says she was previously getting this medication for free. Will call pts insurance company to find out if medication is covered.

## 2020-03-30 NOTE — Telephone Encounter (Signed)
Lm with spouse, pt is going to return call.

## 2020-03-30 NOTE — Telephone Encounter (Signed)
Pt said she tried to refill her Balsalazide prescription and it was going to cost her over $300. She uses [facility] in [place]. Is there something else cheaper for her?  [phone]

## 2020-04-04 ENCOUNTER — Telehealth: Payer: Self-pay | Admitting: Internal Medicine

## 2020-04-04 NOTE — Telephone Encounter (Signed)
Noted  

## 2020-04-04 NOTE — Telephone Encounter (Signed)
Per pt, she asked if we could disregard RX since she didn't mean to call about this medication.

## 2020-04-04 NOTE — Telephone Encounter (Signed)
PATIENT CALLED AND SAID DISREGARD CALL ABOUT THE MEDICATIONS, SHE HAD CALLED IN THE WRONG PRESCRIPTION

## 2020-04-20 ENCOUNTER — Other Ambulatory Visit: Payer: Self-pay | Admitting: Family Medicine

## 2020-04-20 DIAGNOSIS — E1159 Type 2 diabetes mellitus with other circulatory complications: Secondary | ICD-10-CM

## 2020-04-20 DIAGNOSIS — I152 Hypertension secondary to endocrine disorders: Secondary | ICD-10-CM

## 2020-05-01 ENCOUNTER — Other Ambulatory Visit: Payer: Self-pay

## 2020-05-01 ENCOUNTER — Encounter: Payer: Self-pay | Admitting: Family Medicine

## 2020-05-01 ENCOUNTER — Ambulatory Visit (INDEPENDENT_AMBULATORY_CARE_PROVIDER_SITE_OTHER): Payer: Medicare Other | Admitting: Family Medicine

## 2020-05-01 VITALS — BP 120/69 | HR 73 | Temp 98.1°F | Ht 62.5 in | Wt 170.0 lb

## 2020-05-01 DIAGNOSIS — B372 Candidiasis of skin and nail: Secondary | ICD-10-CM

## 2020-05-01 DIAGNOSIS — E785 Hyperlipidemia, unspecified: Secondary | ICD-10-CM

## 2020-05-01 DIAGNOSIS — Z794 Long term (current) use of insulin: Secondary | ICD-10-CM

## 2020-05-01 DIAGNOSIS — E1159 Type 2 diabetes mellitus with other circulatory complications: Secondary | ICD-10-CM

## 2020-05-01 DIAGNOSIS — E1169 Type 2 diabetes mellitus with other specified complication: Secondary | ICD-10-CM | POA: Diagnosis not present

## 2020-05-01 DIAGNOSIS — I10 Essential (primary) hypertension: Secondary | ICD-10-CM

## 2020-05-01 DIAGNOSIS — I152 Hypertension secondary to endocrine disorders: Secondary | ICD-10-CM

## 2020-05-01 DIAGNOSIS — Z23 Encounter for immunization: Secondary | ICD-10-CM

## 2020-05-01 LAB — BAYER DCA HB A1C WAIVED: HB A1C (BAYER DCA - WAIVED): 8.6 % — ABNORMAL HIGH (ref <7.0)

## 2020-05-01 MED ORDER — NYSTATIN 100000 UNIT/GM EX POWD: 1.0000 "application " | Freq: Three times a day (TID) | CUTANEOUS | 0 refills | Status: DC

## 2020-05-01 MED ORDER — GLYXAMBI 25-5 MG PO TABS: 1.0000 | ORAL_TABLET | Freq: Every day | ORAL | 3 refills | Status: DC

## 2020-05-03 NOTE — Progress Notes (Signed)
BP 120/69   Pulse 73   Temp 98.1 F (36.7 C)   Ht 5' 2.5" (1.588 m)   Wt 170 lb (77.1 kg)   SpO2 100%   BMI 30.60 kg/m    Subjective:   Patient ID: Felicia Pugh, female    DOB: 02/16/1946, 74 y.o.   MRN: [id number]  HPI: Felicia Pugh is a 74 y.o. female presenting on 05/01/2020 for Medical Management of Chronic Issues, Diabetes, and Rash (under breast)   HPI Patient is coming in complaining of rash under her breast under both sides, it is irritated and itchy and she has tried Neosporin and [place] without much success.  Is been going on for about a month and started in the summer in the heat.  Type 2 diabetes mellitus Patient comes in today for recheck of his diabetes. Patient has been currently taking [place]. Patient is currently on an ACE inhibitor/ARB. Patient has seen an ophthalmologist this year. Patient denies any issues with their feet. The symptom started onset as an adult hypertension hyperlipidemia ARE RELATED TO DM   Hypertension Patient is currently on losartan hydrochlorothiazide, and their blood pressure today is 120/69. Patient denies any lightheadedness or dizziness. Patient denies headaches, blurred vision, chest pains, shortness of breath, or weakness. Denies any side effects from medication and is content with current medication.   Hyperlipidemia Patient is coming in for recheck of his hyperlipidemia. The patient is currently taking atorvastatin. They deny any issues with myalgias or history of liver damage from it. They deny any focal numbness or weakness or chest pain.   Relevant past medical, surgical, family and social history reviewed and updated as indicated. Interim medical history since our last visit reviewed. Allergies and medications reviewed and updated.  Review of Systems  Constitutional: Negative for chills and fever.  HENT: Negative for ear pain and tinnitus.   Eyes: Negative for blurred vision and pain.  Respiratory:  Negative for cough, shortness of breath and wheezing.   Cardiovascular: Negative for chest pain, palpitations and leg swelling.  Gastrointestinal: Negative for abdominal pain, blood in stool, constipation, diarrhea and melena.  Genitourinary: Negative for dysuria and hematuria.  Musculoskeletal: Negative for back pain, joint pain and myalgias.  Skin: Positive for rash.  Neurological: Negative for dizziness, sensory change, focal weakness, weakness and headaches.  Psychiatric/Behavioral: Negative for depression and suicidal ideas.     Per HPI unless specifically indicated above   Allergies as of 05/01/2020      Reactions   Metformin And Related    Severe diarrhea   Asa [aspirin]    GI Dr does not want her to take due to hx ulcers   Nsaids    GI Dr does not want her to take due to hx of ulcers   Other Diarrhea   Steroids   Tolmetin Other (See Comments)   GI Dr does not want her to take due to hx of ulcers GI Dr does not want her to take due to hx of ulcers      Medication List       Accurate as of May 01, 2020 10:48 AM. If you have any questions, ask your nurse or doctor.        STOP taking these medications   Basaglar KwikPen 100 UNIT/ML Stopped by: Worthy Rancher, MD   ELDERBERRY PO Stopped by: Fransisca Kaufmann Anaiyah Anglemyer, MD   gabapentin 100 MG capsule Commonly known as: NEURONTIN Stopped by: Worthy Rancher, MD  TAKE these medications   atorvastatin 40 MG tablet Commonly known as: LIPITOR Take 1 tablet by mouth once daily   balsalazide 750 MG capsule Commonly known as: COLAZAL Take 3 capsules (2,250 mg total) by mouth 3 (three) times daily.   cholecalciferol 25 MCG (1000 UNIT) tablet Commonly known as: VITAMIN D3 Take 1,000 Units by mouth daily.   dapagliflozin propanediol 10 MG Tabs tablet Commonly known as: Farxiga Take 1 tablet (10 mg total) by mouth daily before breakfast. Hold for now until patient authorizes to fill   Glyxambi 25-5 MG  Tabs Generic drug: Empagliflozin-linaGLIPtin Take 1 tablet by mouth daily. Started by: Fransisca Kaufmann Jakari Sada, MD   Insulin Syringe-Needle U-100 31G X 1/4" 1 ML Misc 1 Syringe by Does not apply route 2 (two) times daily.   IRON 27 PO Take by mouth daily.   loperamide 1 MG/5ML solution Commonly known as: IMODIUM Take 1 mg by mouth as needed for diarrhea or loose stools.   losartan-hydrochlorothiazide 100-25 MG tablet Commonly known as: HYZAAR Take 1 tablet by mouth once daily   nystatin powder Commonly known as: MYCOSTATIN/NYSTOP Apply 1 application topically 3 (three) times daily. Started by: Worthy Rancher, MD   OneTouch Verio test strip Generic drug: glucose blood USE 1 STRIP TO CHECK GLUCOSE TWICE DAILY   QC Tumeric Complex 500 MG Caps Generic drug: Turmeric Take by mouth daily.   therapeutic multivitamin-minerals tablet Take 1 tablet by mouth daily.   Tradjenta 5 MG Tabs tablet Generic drug: linagliptin Take 1 tablet by mouth once daily   Tresiba FlexTouch 100 UNIT/ML FlexTouch Pen Generic drug: insulin degludec Inject 0.4 mLs (40 Units total) into the skin daily. Hold for now until patient authorizes to fill   vitamin C 250 MG tablet Commonly known as: ASCORBIC ACID Take 250 mg by mouth daily.        Objective:   BP 120/69   Pulse 73   Temp 98.1 F (36.7 C)   Ht 5' 2.5" (1.588 m)   Wt 170 lb (77.1 kg)   SpO2 100%   BMI 30.60 kg/m   Wt Readings from Last 3 Encounters:  05/01/20 170 lb (77.1 kg)  01/28/20 171 lb (77.6 kg)  12/29/19 171 lb 6.4 oz (77.7 kg)    Physical Exam Vitals and nursing note reviewed.  Constitutional:      General: She is not in acute distress.    Appearance: She is well-developed. She is not diaphoretic.  Eyes:     Conjunctiva/sclera: Conjunctivae normal.     Pupils: Pupils are equal, round, and reactive to light.  Cardiovascular:     Rate and Rhythm: Normal rate and regular rhythm.     Heart sounds: Normal heart  sounds. No murmur heard.   Pulmonary:     Effort: Pulmonary effort is normal. No respiratory distress.     Breath sounds: Normal breath sounds. No wheezing.  Musculoskeletal:        General: No tenderness. Normal range of motion.  Skin:    General: Skin is warm and dry.     Findings: Rash (Pink raised rash under both breast.) present.  Neurological:     Mental Status: She is alert and oriented to person, place, and time.     Coordination: Coordination normal.  Psychiatric:        Behavior: Behavior normal.     A1c is slightly improved at 8.6, still elevated, she had tried the [place] but did not like it and was  only a sample and did not continue it.  Assessment & Plan:   Problem List Items Addressed This Visit      Cardiovascular and Mediastinum   Hypertension associated with diabetes ([place])   Relevant Medications   Empagliflozin-linaGLIPtin (GLYXAMBI) 25-5 MG TABS     Endocrine   Hyperlipidemia associated with type 2 diabetes mellitus ([place])   Relevant Medications   Empagliflozin-linaGLIPtin (GLYXAMBI) 25-5 MG TABS   Type 2 diabetes mellitus with other specified complication (HCC) - Primary   Relevant Medications   Empagliflozin-linaGLIPtin (GLYXAMBI) 25-5 MG TABS   Other Relevant Orders   Bayer DCA Hb A1c Waived (Completed)    Other Visit Diagnoses    Flu vaccine need       Relevant Orders   Flu Vaccine QUAD High Dose(Fluad) (Completed)   Yeast dermatitis       Relevant Medications   nystatin (MYCOSTATIN/NYSTOP) powder      We will try and add Glyxambi and see if that helps with the blood sugars and stabilization, her A1c is much improved but she is still having a lot of fluctuation especially postmeal fluctuation.  She will stop Tradjenta while she is on Glyxambi Follow up plan: Return in about 3 months (around 07/31/2020), or if symptoms worsen or fail to improve, for Diabetes recheck.  Counseling provided for all of the vaccine components Orders Placed This  Encounter  Procedures  . Flu Vaccine QUAD High Dose(Fluad)  . Bayer [facility] Hb A1c [place], MD [facility] 05/01/2020, 10:48 AM

## 2020-05-23 ENCOUNTER — Other Ambulatory Visit: Payer: Self-pay | Admitting: Family Medicine

## 2020-05-23 DIAGNOSIS — E785 Hyperlipidemia, unspecified: Secondary | ICD-10-CM

## 2020-05-23 DIAGNOSIS — E1169 Type 2 diabetes mellitus with other specified complication: Secondary | ICD-10-CM

## 2020-05-24 ENCOUNTER — Other Ambulatory Visit: Payer: Self-pay

## 2020-05-24 ENCOUNTER — Ambulatory Visit (INDEPENDENT_AMBULATORY_CARE_PROVIDER_SITE_OTHER): Payer: Medicare Other

## 2020-05-24 DIAGNOSIS — Z0000 Encounter for general adult medical examination without abnormal findings: Secondary | ICD-10-CM

## 2020-05-24 MED ORDER — ONETOUCH VERIO VI STRP: ORAL_STRIP | 2 refills | Status: DC

## 2020-05-24 MED ORDER — PEN NEEDLES 31G X 5 MM MISC: 1 refills | Status: DC

## 2020-05-24 NOTE — Progress Notes (Signed)
MEDICARE ANNUAL WELLNESS VISIT  05/24/2020  Telephone Visit Disclaimer This Medicare AWV was conducted by telephone due to national recommendations for restrictions regarding the COVID-19 Pandemic (e.g. social distancing).  I verified, using two identifiers, that I am speaking with Felicia Pugh or their authorized healthcare agent. I discussed the limitations, risks, security, and privacy concerns of performing an evaluation and management service by telephone and the potential availability of an in-person appointment in the future. The patient expressed understanding and agreed to proceed.  Location of Patient: Home Location of Provider (nurse):  [facility]  Subjective:    Felicia Pugh is a 74 y.o. female patient of Dettinger, Fransisca Kaufmann, MD who had a Medicare Annual Wellness Visit today via telephone. Felicia Pugh is a very pleasant lady that lives in nearby [place]. She lives with her husband and together they have 5 children. She has 1 son that lives next door, 1 son that lives behind her, 2 daughters within walking distance, and another child that resides in [place]. 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 She doesn't exercise on a regular basis but tries to stay as active as possible. She states that her health is the same as it was this time last year but her mobility has improved. She suffers from chronic back pain.   Patient Care Team: Dettinger, Fransisca Kaufmann, MD as PCP - General (Family Medicine) Gala Romney, Cristopher Estimable, MD as Consulting Physician (Gastroenterology) Starling Manns, MD as Consulting Physician (Orthopedic Surgery) Annitta Needs, NP (Gastroenterology)  Advanced Directives 05/24/2020 05/24/2019 05/19/2018 05/15/2017 05/01/2017 03/17/2017 07/21/2016  Does Patient Have a Medical Advance Directive? No;Yes Yes Yes No No No No  Type of Industrial/product designer of [employer] Power of Attorney - - -  -  Does patient want to make changes to medical advance directive? No - Patient declined No - Patient declined Yes (MAU/Ambulatory/Procedural Areas - Information given) - - - -  Copy of [place] in Chart? No - copy requested No - copy requested No - copy requested - - - -  Would patient like information on creating a medical advance directive? No - Patient declined - - No - Patient declined Yes (MAU/Ambulatory/Procedural Areas - Information given) No - Patient declined No - Patient declined    Hospital Utilization Over the Past 12 Months: # of hospitalizations or ER visits: 0 # of surgeries: 0  Review of Systems    Patient reports that her overall health is unchanged compared to last year.  History obtained from chart review  Patient Reported Readings (BP, Pulse, CBG, Weight, etc) none  Pain Assessment Pain : 0-10 Pain Type: Chronic pain Pain Location: Back Pain Descriptors / Indicators: Aching Pain Onset: More than a month ago Pain Frequency: Intermittent Pain Relieving Factors: Tylenol  Pain Relieving Factors: Tylenol  Current Medications & Allergies (verified) Allergies as of 05/24/2020      Reactions   Metformin And Related    Severe diarrhea   Asa [aspirin]    GI Dr does not want her to take due to hx ulcers   Nsaids    GI Dr does not want her to take due to hx of ulcers   Other Diarrhea   Steroids   Tolmetin Other (See Comments)   GI Dr does not want her to take due to hx of ulcers GI Dr does not want her to take due  to hx of ulcers      Medication List       Accurate as of May 24, 2020 10:49 AM. If you have any questions, ask your nurse or doctor.        atorvastatin 40 MG tablet Commonly known as: LIPITOR Take 1 tablet by mouth once daily   balsalazide 750 MG capsule Commonly known as: COLAZAL Take 3 capsules (2,250 mg total) by mouth 3 (three) times daily.   cholecalciferol 25 MCG (1000 UNIT) tablet Commonly known as:  VITAMIN D3 Take 1,000 Units by mouth daily.   dapagliflozin propanediol 10 MG Tabs tablet Commonly known as: Farxiga Take 1 tablet (10 mg total) by mouth daily before breakfast. Hold for now until patient authorizes to fill   Glyxambi 25-5 MG Tabs Generic drug: Empagliflozin-linaGLIPtin Take 1 tablet by mouth daily.   Insulin Syringe-Needle U-100 31G X 1/4" 1 ML Misc 1 Syringe by Does not apply route 2 (two) times daily.   IRON 27 PO Take by mouth daily.   loperamide 1 MG/5ML solution Commonly known as: IMODIUM Take 1 mg by mouth as needed for diarrhea or loose stools.   losartan-hydrochlorothiazide 100-25 MG tablet Commonly known as: HYZAAR Take 1 tablet by mouth once daily   nystatin powder Commonly known as: MYCOSTATIN/NYSTOP Apply 1 application topically 3 (three) times daily.   OneTouch Verio test strip Generic drug: glucose blood USE 1 STRIP TO CHECK GLUCOSE TWICE DAILY   QC Tumeric Complex 500 MG Caps Generic drug: Turmeric Take by mouth daily.   therapeutic multivitamin-minerals tablet Take 1 tablet by mouth daily.   Tradjenta 5 MG Tabs tablet Generic drug: linagliptin Take 1 tablet by mouth once daily   Tresiba FlexTouch 100 UNIT/ML FlexTouch Pen Generic drug: insulin degludec Inject 0.4 mLs (40 Units total) into the skin daily. Hold for now until patient authorizes to fill   vitamin C 250 MG tablet Commonly known as: ASCORBIC ACID Take 250 mg by mouth daily.       History (reviewed): Past Medical History:  Diagnosis Date  . 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 Cataract   . Complication of anesthesia    difficulty breathing after waking up from anesthesia  . Crohn's disease ([place])   . Diabetes mellitus without complication ([place])   . Diabetic neuropathy ([place])   . Hyperlipidemia   . Hypertension   . IBS (irritable bowel syndrome)   . Stroke [facility]) 2011  . Syncope and collapse   .  Thyroid nodule   . Ulcer    Past Surgical History:  Procedure Laterality Date  . ABDOMINAL HYSTERECTOMY     partial  . BACK SURGERY     x3 (Dr Loyola Mast, Dr Patrice Paradise)  . BREAST SURGERY Right 2002   cyst removed  . COLONOSCOPY  10/102006   RMR: normal  . COLONOSCOPY  09/06/2009   RMR: Suboptimal prep on the right side. Ulcers noted at cecum and in the descending colon. Terminal ileum could not be intubated. Ounces with chronic active colitis, question Crohn's  . COLONOSCOPY N/A 03/27/2015   Dr. Gala Romney: abnormal cecum and IC valve most consistent with IBD. TI intubated and appeared normal. Likely Crohn's disease. Patient denies NSAID use. No improvement with Lialda samples at time of initial diagnosis  . COLONOSCOPY N/A 05/15/2017   Dr. Gala Romney: ab normal IC valve, s/p biopsy with active ileitis, diverticulosis in sigmoid and descending colon. Non-bleeding internal  hemorrhoids  . ESOPHAGOGASTRODUODENOSCOPY    . LAPAROSCOPIC APPENDECTOMY N/A 07/21/2016   Procedure: APPENDECTOMY LAPAROSCOPIC;  Surgeon: Aviva Signs, MD;  Location: [facility];  Service: General;  Laterality: N/A;  . Small bowel capsule endoscopy  06/10/2005   RMR: single erosion versus AVM, distal ileum  . Small bowel capsule endoscopy  prior to 05/2005   [place]: reported erosions/ulcerations per medical record. actual report unavailable.   Family History  Problem Relation Age of Onset  . Hypertension Mother   . Stroke Mother   . Diabetes Father        w/ BKA  . Hypertension Father   . Asthma Father   . Cancer Sister   . Diabetes Sister        stomach  . Diabetes Brother   . Diabetes Sister   . Cancer Sister        breast  . Hypertension Sister   . Hypertension Daughter   . Hypertension Son   . Multiple sclerosis Son   . Colon cancer Neg Hx   . Inflammatory bowel disease Neg Hx    Social History   Socioeconomic History  . Marital status: Married    Spouse name: Herbie Baltimore  . Number of children: 5  . Years of education:  9th grade-then got her GED at [employer]  . Highest education level: GED or equivalent  Occupational History  . Occupation: retired  Tobacco Use  . Smoking status: Never Smoker  . Smokeless tobacco: Never Used  Vaping Use  . Vaping Use: Never used  Substance and Sexual Activity  . Alcohol use: No  . Drug use: No  . Sexual activity: Not Currently  Other Topics Concern  . Not on file  Social History Narrative  . Not on file   Social Determinants of Health   Financial Resource Strain: Low Risk   . Difficulty of Paying Living Expenses: Not hard at all  Food Insecurity: No Food Insecurity  . Worried About Charity fundraiser in the Last Year: Never true  . Ran Out of Food in the Last Year: Never true  Transportation Needs: No Transportation Needs  . Lack of Transportation (Medical): No  . Lack of Transportation (Non-Medical): No  Physical Activity: Inactive  . Days of Exercise per Week: 0 days  . Minutes of Exercise per Session: 0 min  Stress: No Stress Concern Present  . Feeling of Stress : Not at all  Social Connections: Socially Integrated  . Frequency of Communication with Friends and Family: More than three times a week  . Frequency of Social Gatherings with Friends and Family: More than three times a week  . Attends Religious Services: More than 4 times per year  . Active Member of Clubs or Organizations: Yes  . Attends Archivist Meetings: More than 4 times per year  . Marital Status: Married    Activities of Daily Living In your present state of health, do you have any difficulty performing the following activities: 05/24/2020  Hearing? N  Vision? N  Difficulty concentrating or making decisions? N  Walking or climbing stairs? N  Dressing or bathing? N  Doing errands, shopping? N  Preparing Food and eating ? N  Using the Toilet? N  In the past six months, have you accidently leaked urine? N  Do you have problems with loss of bowel control? N  Managing your  Medications? N  Managing your Finances? N  Housekeeping or managing your Housekeeping? N  Some recent  data might be hidden    Patient Education/ Literacy How often do you need to have someone help you when you read instructions, pamphlets, or other written materials from your doctor or pharmacy?: 1 - Never What is the last grade level you completed in school?: GED  Exercise Current Exercise Habits: The patient does not participate in regular exercise at present, Exercise limited by: orthopedic condition(s)  Diet Patient reports consuming 3 meals a day and 2 snack(s) a day Patient reports that her primary diet is: Regular Patient reports that she does have regular access to food.   Depression Screen PHQ 2/9 Scores 05/01/2020 01/28/2020 12/21/2019 10/27/2019 05/24/2019 10/12/2018 07/10/2018  PHQ - 2 Score 0 0 0 0 0 0 0     Fall Risk Fall Risk  05/24/2020 05/01/2020 01/28/2020 12/21/2019 10/27/2019  Falls in the past year? 0 0 0 0 0  Number falls in past yr: - - - - -  Injury with Fall? - - - - -  Follow up - - - - -  Comment - - - - -     Objective:  Felicia Pugh seemed alert and oriented and she participated appropriately during our telephone visit.  Blood Pressure Weight BMI  BP Readings from Last 3 Encounters:  05/01/20 120/69  01/28/20 122/73  12/29/19 110/72   Wt Readings from Last 3 Encounters:  05/01/20 170 lb (77.1 kg)  01/28/20 171 lb (77.6 kg)  12/29/19 171 lb 6.4 oz (77.7 kg)   BMI Readings from Last 1 Encounters:  05/01/20 30.60 kg/m    *Unable to obtain current vital signs, weight, and BMI due to telephone visit type  Hearing/Vision  . Shekira did not seem to have difficulty with hearing/understanding during the telephone conversation . Reports that she has had a formal eye exam by an eye care professional within the past year . Reports that she has not had a formal hearing evaluation within the past year *Unable to fully assess hearing and vision during  telephone visit type  Cognitive Function: 6CIT Screen 05/24/2020 05/24/2019  What Year? 0 points 0 points  What month? 0 points 0 points  What time? 0 points 0 points  Count back from 20 0 points 0 points  Months in reverse 0 points 0 points  Repeat phrase 0 points 0 points  Total Score 0 0   (Normal:0-7, Significant for Dysfunction: >8)  Normal Cognitive Function Screening: Yes   Immunization & Health Maintenance Record Immunization History  Administered Date(s) Administered  . Fluad Quad(high Dose 65+) 05/01/2020  . Influenza, High Dose Seasonal PF 05/19/2018, 04/24/2019  . Influenza,inj,Quad PF,6+ Mos 05/13/2013, 05/27/2014, 06/22/2015, 06/11/2016, 06/26/2017  . Moderna SARS-COVID-2 Vaccination 09/23/2019, 10/26/2019  . Pneumococcal Conjugate-13 07/08/2014  . Pneumococcal Polysaccharide-23 12/16/2016  . Tdap 12/30/2017  . Zoster Recombinat (Shingrix) 07/14/2019    Health Maintenance  Topic Date Due  . OPHTHALMOLOGY EXAM  09/28/2020  . FOOT EXAM  10/26/2020  . HEMOGLOBIN A1C  10/29/2020  . MAMMOGRAM  05/24/2021  . COLONOSCOPY  05/16/2027  . TETANUS/TDAP  12/31/2027  . INFLUENZA VACCINE  Completed  . DEXA SCAN  Completed  . COVID-19 Vaccine  Completed  . Hepatitis C Screening  Completed  . PNA vac Low Risk Adult  Completed       Assessment  This is a routine wellness examination for [place] Maintenance: Due or Overdue There are no preventive care reminders to display for this patient.  Felicia Pugh does  not need a referral for Community Assistance: Care Management:   no Social Work:    no Prescription Assistance:  no Nutrition/Diabetes Education:  no   Plan:  Personalized Goals Goals Addressed   None    Personalized Health Maintenance & Screening Recommendations  Follow up as scheduled  Lung Cancer Screening Recommended: no (Low Dose CT Chest recommended if Age 80-80 years, 30 pack-year currently smoking OR have quit w/in past 15  years) Hepatitis C Screening recommended: no HIV Screening recommended: no  Advanced Directives: Written information was not prepared per patient's request.  Referrals & Orders No orders of the defined types were placed in this encounter.   Follow-up Plan . Follow-up with Dettinger, Fransisca Kaufmann, MD as planned . Schedule for a regular follow up when time     I have personally reviewed and noted the following in the patient's chart:   . Medical and social history . Use of alcohol, tobacco or illicit drugs  . Current medications and supplements . Functional ability and status . Nutritional status . Physical activity . Advanced directives . List of other physicians . Hospitalizations, surgeries, and ER visits in previous 12 months . Vitals . Screenings to include cognitive, depression, and falls . Referrals and appointments  In addition, I have reviewed and discussed with Felicia Pugh certain preventive protocols, quality metrics, and best practice recommendations. A written personalized care plan for preventive services as well as general preventive health recommendations is available and can be mailed to the patient at her request.      Rolena Infante LPN [id number]/05/[id number]

## 2020-05-24 NOTE — Patient Instructions (Signed)
  Felicia Pugh , Thank you for taking time to come for your Medicare Wellness Visit. I appreciate your ongoing commitment to your health goals. Please review the following plan we discussed and let me know if I can assist you in the future.   These are the goals we discussed: Goals    . DIET - INCREASE WATER INTAKE     Try to drink 6-8 glasses of water daily.    . Exercise 3x per week (30 min per time)    . HEMOGLOBIN A1C < 7.0     Watch carbohydrate intake and reduce sugary snacks.         This is a list of the screening recommended for you and due dates:  Health Maintenance  Topic Date Due  . Eye exam for diabetics  09/28/2020  . Complete foot exam   10/26/2020  . Hemoglobin A1C  10/29/2020  . Mammogram  05/24/2021  . Colon Cancer Screening  05/16/2027  . Tetanus Vaccine  12/31/2027  . Flu Shot  Completed  . DEXA scan (bone density measurement)  Completed  . COVID-19 Vaccine  Completed  .  Hepatitis C: One time screening is recommended by Center for Disease Control  (CDC) for  adults born from [birth year] through 1965.   Completed  . Pneumonia vaccines  Completed

## 2020-05-29 ENCOUNTER — Other Ambulatory Visit: Payer: Self-pay | Admitting: Family Medicine

## 2020-05-29 DIAGNOSIS — Z1231 Encounter for screening mammogram for malignant neoplasm of breast: Secondary | ICD-10-CM

## 2020-06-20 ENCOUNTER — Inpatient Hospital Stay: Admission: RE | Admit: 2020-06-20 | Source: Ambulatory Visit

## 2020-07-04 ENCOUNTER — Ambulatory Visit (INDEPENDENT_AMBULATORY_CARE_PROVIDER_SITE_OTHER): Payer: Medicare Other | Admitting: Nurse Practitioner

## 2020-07-04 ENCOUNTER — Encounter: Payer: Self-pay | Admitting: Nurse Practitioner

## 2020-07-04 ENCOUNTER — Other Ambulatory Visit: Payer: Self-pay

## 2020-07-04 VITALS — BP 116/71 | HR 87 | Temp 97.8°F | Ht 63.0 in | Wt 170.6 lb

## 2020-07-04 DIAGNOSIS — K5000 Crohn's disease of small intestine without complications: Secondary | ICD-10-CM

## 2020-07-04 DIAGNOSIS — K529 Noninfective gastroenteritis and colitis, unspecified: Secondary | ICD-10-CM

## 2020-07-04 DIAGNOSIS — R748 Abnormal levels of other serum enzymes: Secondary | ICD-10-CM | POA: Diagnosis not present

## 2020-07-04 NOTE — Progress Notes (Signed)
Referring Provider: Dettinger, Fransisca Kaufmann, MD Primary Care Physician:  Dettinger, Fransisca Kaufmann, MD Primary GI:  Dr. Gala Romney  Chief Complaint  Patient presents with  . Crohn's Disease    doing okay. no abd pain  . Diarrhea    episodes varies    HPI:   Felicia Pugh is a 74 y.o. female who presents for follow-up on Crohn's disease and diarrhea.  The patient was last seen in our office 12/29/2019 for the same as well as GERD and anemia.  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 Colonoscopy up-to-date 2018 with active ileitis otherwise normal.  LFTs normalized (previously elevated) including alkaline phosphatase and GGT.  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  Uses Colazal (insurance would not approve Lialda) as well as Bentyl and Imodium.  At her last visit requesting refill of Colazal.  Diarrhea doing well uses Imodium as needed (typically 3 times a week) and only takes it if she is going to be going on the road.  No other overt GI complaints.  Recommend she continue her current medications, discussed normal iron studies and recommended discussing with her primary care about stopping oral iron to see if they are okay with this, refill of Colazal (balsalazide) sent to the pharmacy, requested updated labs, follow-up in 6 months.  Labs are completed 12/29/2019 which showed very mild anemia with hemoglobin 11.3, normal platelets.  Mild leukocytosis around 11.  CMP showed normal kidney and liver function, completely normalized LFTs.  Today she states she is doing okay overall. States she still has diarrhea if she has to leave the house to go to [place] and gets anxious about this. Will have about 2 loose when this occurs. Imodium helps. Has dark stool on oral iron.Denies abdominal pain, N/V, hematochezia, fever, chills, unintentional weight loss. Denies URI or flu-like symptoms. Denies loss of sense of taste or smell. The patient has received COVID-19 vaccination(s). Denies chest pain, dyspnea, dizziness,  lightheadedness, syncope, near syncope. Denies any other upper or lower GI symptoms.  Still taking Colazal 3 capsules tid. Notes numbness, tingling, burning on fingertips especially in the morning. Not sure if it's her back problems (ie- pinched nerve?) versus DM complications.  Past Medical History:  Diagnosis Date  . 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 Cataract   . Complication of anesthesia    difficulty breathing after waking up from anesthesia  . Crohn's disease ([place])   . Diabetes mellitus without complication ([place])   . Diabetic neuropathy ([place])   . Hyperlipidemia   . Hypertension   . IBS (irritable bowel syndrome)   . Stroke [facility]) 2011  . Syncope and collapse   . Thyroid nodule   . Ulcer     Past Surgical History:  Procedure Laterality Date  . ABDOMINAL HYSTERECTOMY     partial  . BACK SURGERY     x3 (Dr Loyola Mast, Dr Patrice Paradise)  . BREAST SURGERY Right 2002   cyst removed  . COLONOSCOPY  10/102006   RMR: normal  . COLONOSCOPY  09/06/2009   RMR: Suboptimal prep on the right side. Ulcers noted at cecum and in the descending colon. Terminal ileum could not be intubated. Ounces with chronic active colitis, question Crohn's  . COLONOSCOPY N/A 03/27/2015   Dr. Gala Romney: abnormal cecum and IC valve most consistent with IBD. TI intubated and appeared normal. Likely Crohn's disease. Patient denies NSAID use. No improvement with Lialda samples at time of initial  diagnosis  . COLONOSCOPY N/A 05/15/2017   Dr. Gala Romney: ab normal IC valve, s/p biopsy with active ileitis, diverticulosis in sigmoid and descending colon. Non-bleeding internal hemorrhoids  . ESOPHAGOGASTRODUODENOSCOPY    . LAPAROSCOPIC APPENDECTOMY N/A 07/21/2016   Procedure: APPENDECTOMY LAPAROSCOPIC;  Surgeon: Aviva Signs, MD;  Location: [facility];  Service: General;  Laterality: N/A;  . Small bowel capsule endoscopy  06/10/2005   RMR: single erosion versus AVM, distal  ileum  . Small bowel capsule endoscopy  prior to 05/2005   [place]: reported erosions/ulcerations per medical record. actual report unavailable.    Current Outpatient Medications  Medication Sig Dispense Refill  . atorvastatin (LIPITOR) 40 MG tablet Take 1 tablet by mouth once daily 90 tablet 1  . balsalazide (COLAZAL) 750 MG capsule Take 3 capsules (2,250 mg total) by mouth 3 (three) times daily. 810 capsule 3  . cholecalciferol (VITAMIN D3) 25 MCG (1000 UNIT) tablet Take 1,000 Units by mouth daily.    . Empagliflozin-linaGLIPtin (GLYXAMBI) 25-5 MG TABS Take 1 tablet by mouth daily. 90 tablet 3  . Ferrous Gluconate (IRON 27 PO) Take by mouth daily.    [name] glucose blood (ONETOUCH VERIO) test strip USE 1 STRIP TO CHECK GLUCOSE TWICE DAILY 200 each 2  . Insulin Glargine (BASAGLAR KWIKPEN [place]) Inject 40 Units into the skin in the morning.    . Insulin Pen Needle (PEN NEEDLES) 31G X 5 MM MISC Use to give insulin with flexpen 100 each 1  . Insulin Syringe-Needle U-100 31G X 1/4" 1 ML MISC 1 Syringe by Does not apply route 2 (two) times daily. 60 each 6  . loperamide (IMODIUM) 1 MG/5ML solution Take 1 mg by mouth as needed for diarrhea or loose stools.    [name] losartan (COZAAR) 100 MG tablet Take 100 mg by mouth daily.    [name] therapeutic multivitamin-minerals (THERAGRAN-M) tablet Take 1 tablet by mouth daily.    . vitamin C (ASCORBIC ACID) 250 MG tablet Take 250 mg by mouth daily.     No current facility-administered medications for this visit.    Allergies as of 07/04/2020 - Review Complete 07/04/2020  Allergen Reaction Noted  . Metformin and related  05/11/2014  . Asa [aspirin]  11/04/2012  . Nsaids  11/04/2012  . Other Diarrhea 12/25/2014  . Tolmetin Other (See Comments) 11/04/2012    Family History  Problem Relation Age of Onset  . Hypertension Mother   . Stroke Mother   . Diabetes Father        w/ BKA  . Hypertension Father   . Asthma Father   . Cancer Sister   . Diabetes Sister         stomach  . Diabetes Brother   . Diabetes Sister   . Cancer Sister        breast  . Hypertension Sister   . Hypertension Daughter   . Hypertension Son   . Multiple sclerosis Son   . Colon cancer Neg Hx   . Inflammatory bowel disease Neg Hx     Social History   Socioeconomic History  . Marital status: Married    Spouse name: Herbie Baltimore  . Number of children: 5  . Years of education: 9th grade-then got her GED at [employer]  . Highest education level: GED or equivalent  Occupational History  . Occupation: retired  Tobacco Use  . Smoking status: Never Smoker  . Smokeless tobacco: Never Used  Vaping Use  . Vaping Use: Never used  Substance and Sexual  Activity  . Alcohol use: No  . Drug use: No  . Sexual activity: Not Currently  Other Topics Concern  . Not on file  Social History Narrative  . Not on file   Social Determinants of Health   Financial Resource Strain:   . Difficulty of Paying Living Expenses: Not on file  Food Insecurity:   . Worried About Charity fundraiser in the Last Year: Not on file  . Ran Out of Food in the Last Year: Not on file  Transportation Needs:   . Lack of Transportation (Medical): Not on file  . Lack of Transportation (Non-Medical): Not on file  Physical Activity:   . Days of Exercise per Week: Not on file  . Minutes of Exercise per Session: Not on file  Stress:   . Feeling of Stress : Not on file  Social Connections:   . Frequency of Communication with Friends and Family: Not on file  . Frequency of Social Gatherings with Friends and Family: Not on file  . Attends Religious Services: Not on file  . Active Member of Clubs or Organizations: Not on file  . Attends Archivist Meetings: Not on file  . Marital Status: Not on file    Subjective: Review of Systems  Constitutional: Negative for chills, fever, malaise/fatigue and weight loss.  HENT: Negative for congestion and sore throat.   Respiratory: Negative for cough and shortness of  breath.   Cardiovascular: Negative for chest pain and palpitations.  Gastrointestinal: Positive for diarrhea. Negative for abdominal pain, blood in stool, constipation, melena, nausea and vomiting.  Musculoskeletal: Negative for joint pain and myalgias.  Skin: Negative for rash.  Neurological: Negative for dizziness and weakness.  Endo/Heme/Allergies: Does not bruise/bleed easily.  Psychiatric/Behavioral: Negative for depression. The patient is not nervous/anxious.   All other systems reviewed and are negative.    Objective: BP 116/71   Pulse 87   Temp 97.8 F (36.6 C)   Ht 5' 3"  (1.6 m)   Wt 170 lb 9.6 oz (77.4 kg)   BMI 30.22 kg/m  Physical Exam Vitals and nursing note reviewed.  Constitutional:      General: She is not in acute distress.    Appearance: Normal appearance. She is well-developed. She is obese. She is not ill-appearing, toxic-appearing or diaphoretic.  HENT:     Head: Normocephalic and atraumatic.     Nose: No congestion or rhinorrhea.  Eyes:     General: No scleral icterus. Cardiovascular:     Rate and Rhythm: Normal rate and regular rhythm.     Heart sounds: Normal heart sounds.  Pulmonary:     Effort: Pulmonary effort is normal. No respiratory distress.     Breath sounds: Normal breath sounds.  Abdominal:     General: Bowel sounds are normal.     Palpations: Abdomen is soft. There is no hepatomegaly, splenomegaly or mass.     Tenderness: There is no abdominal tenderness. There is no guarding or rebound.     Hernia: No hernia is present.  Skin:    General: Skin is warm and dry.     Coloration: Skin is not jaundiced.     Findings: No rash.  Neurological:     General: No focal deficit present.     Mental Status: She is alert and oriented to person, place, and time.  Psychiatric:        Attention and Perception: Attention normal.        Mood and  Affect: Mood normal.        Speech: Speech normal.        Behavior: Behavior normal.        Thought  Content: Thought content normal.        Cognition and Memory: Cognition and memory normal.      Assessment:  Very pleasant 74 year old female who presents for follow-up on Crohn's with IBS overlay.  Overall she is doing quite well.  Labs have consistently been normal.  Remains on Colazal 3 pills 3 times daily.  Imodium as needed.  Crohn's disease: Previous colonoscopy in 2018, mild inflammation consistent with Crohn's and started on Colazal.  She has been doing quite well on this without any abdominal pain, rectal bleeding since starting Colazal.  Her labs have been essentially normal other than mild bump in white blood cell count at her last CBC.  Overall she is satisfied with how she is doing.  She is due for CBC and CMP at this time.  IBS overlay: Typically does well, only has more frequent loose stools if she has to leave the house and go to [place].  This is likely an anxiety triggered given that she knows she has to be on the road and is worried about diarrhea.  Typically she takes Imodium on these days and this helps maintain/control her symptoms quite well.  At this point we will have her continue her current regimen and call for worsening symptoms.  Previously elevated alkaline phosphatase: Her LFTs have been normalized with the past 2 lab draws.  We will again check a CMP to evaluate as we need to do this anyway for her Crohn's disease on Colazal/balsalazide.   Plan: 1. Continue current medications 2. Continue to use Imodium as needed for IBS diarrhea on days that it occurs 3. Call for worsening symptoms 4. CBC, CMP 5. Follow-up in 6 months    Thank you for allowing [place] to participate in the care of Felicia Pugh  Walden Field, DNP, AGNP-C Adult & Gerontological Nurse Practitioner [facility]   07/04/2020 11:15 AM   Disclaimer: This note was dictated with voice recognition software. Similar sounding words can inadvertently be transcribed and may  not be corrected upon review.

## 2020-07-04 NOTE — Patient Instructions (Signed)
Your health issues we discussed today were:   Crohn's disease with irritable bowel syndrome/chronic diarrhea overlay: 1. Continue taking Colazal (balsalazide) 3 pills, 3 times a day 2. Continue to use Imodium as needed on days where you have to go to [facility] or otherwise have increased loose stools 3. Call us if you have any persistent worsening of your loose stools, abdominal pain, or blood in your stools 4. Have your labs completed when you are able to 5. Call us for any worsening or severe symptoms  Overall I recommend:  1. Continue your other current medications 2. Return for follow-up in 6 months 3. Call us for any questions or concerns   ---------------------------------------------------------------  I am glad you have gotten your COVID-19 vaccination!  Even though you are fully vaccinated you should continue to follow CDC and state/local guidelines.  ---------------------------------------------------------------   At [facility] we value your feedback. You may receive a survey about your visit today. Please share your experience as we strive to create trusting relationships with our patients to provide genuine, compassionate, quality care.  We appreciate your understanding and patience as we review any laboratory studies, imaging, and other diagnostic tests that are ordered as we care for you. Our office policy is 5 business days for review of these results, and any emergent or urgent results are addressed in a timely manner for your best interest. If you do not hear from our office in 1 week, please contact us.   We also encourage the use of MyChart, which contains your medical information for your review as well. If you are not enrolled in this feature, an access code is on this after visit summary for your convenience. Thank you for allowing [place] to be involved in your care.  It was great to see you today!  I hope you have a Happy Thanksgiving!!

## 2020-07-04 NOTE — Addendum Note (Signed)
Addended by: Gordy Levan, Rosalea Withrow A on: 07/04/2020 11:26 AM   Modules accepted: Orders

## 2020-07-05 LAB — CBC WITH DIFFERENTIAL/PLATELET
Absolute Monocytes: 547 cells/uL (ref 200–950)
Basophils Absolute: 91 cells/uL (ref 0–200)
Basophils Relative: 1.2 %
Eosinophils Absolute: 160 cells/uL (ref 15–500)
Eosinophils Relative: 2.1 %
HCT: 38.7 % (ref 35.0–45.0)
Hemoglobin: 12.5 g/dL (ref 11.7–15.5)
Lymphs Abs: 1854 cells/uL (ref 850–3900)
MCH: 28.5 pg (ref 27.0–33.0)
MCHC: 32.3 g/dL (ref 32.0–36.0)
MCV: 88.4 fL (ref 80.0–100.0)
MPV: 9.9 fL (ref 7.5–12.5)
Monocytes Relative: 7.2 %
Neutro Abs: 4948 cells/uL (ref 1500–7800)
Neutrophils Relative %: 65.1 %
Platelets: 346 10*3/uL (ref 140–400)
RBC: 4.38 10*6/uL (ref 3.80–5.10)
RDW: 12.5 % (ref 11.0–15.0)
Total Lymphocyte: 24.4 %
WBC: 7.6 10*3/uL (ref 3.8–10.8)

## 2020-07-05 LAB — COMPREHENSIVE METABOLIC PANEL
AG Ratio: 1.5 (calc) (ref 1.0–2.5)
ALT: 12 U/L (ref 6–29)
AST: 19 U/L (ref 10–35)
Albumin: 4.3 g/dL (ref 3.6–5.1)
Alkaline phosphatase (APISO): 84 U/L (ref 37–153)
BUN: 23 mg/dL (ref 7–25)
CO2: 32 mmol/L (ref 20–32)
Calcium: 9.6 mg/dL (ref 8.6–10.4)
Chloride: 105 mmol/L (ref 98–110)
Creat: 0.77 mg/dL (ref 0.60–0.93)
Globulin: 2.8 g/dL (calc) (ref 1.9–3.7)
Glucose, Bld: 83 mg/dL (ref 65–99)
Potassium: 4.5 mmol/L (ref 3.5–5.3)
Sodium: 143 mmol/L (ref 135–146)
Total Bilirubin: 0.4 mg/dL (ref 0.2–1.2)
Total Protein: 7.1 g/dL (ref 6.1–8.1)

## 2020-07-10 ENCOUNTER — Other Ambulatory Visit: Payer: Self-pay

## 2020-07-10 ENCOUNTER — Ambulatory Visit
Admission: RE | Admit: 2020-07-10 | Discharge: 2020-07-10 | Disposition: A | Discharge status: Home / Self Care | Source: Ambulatory Visit | Attending: Family Medicine | Admitting: Family Medicine

## 2020-07-10 DIAGNOSIS — Z1231 Encounter for screening mammogram for malignant neoplasm of breast: Secondary | ICD-10-CM

## 2020-07-18 ENCOUNTER — Other Ambulatory Visit: Payer: Self-pay | Admitting: Family Medicine

## 2020-07-18 DIAGNOSIS — E1169 Type 2 diabetes mellitus with other specified complication: Secondary | ICD-10-CM

## 2020-07-18 DIAGNOSIS — Z794 Long term (current) use of insulin: Secondary | ICD-10-CM

## 2020-07-19 ENCOUNTER — Other Ambulatory Visit: Payer: Self-pay | Admitting: Family Medicine

## 2020-07-19 DIAGNOSIS — E1159 Type 2 diabetes mellitus with other circulatory complications: Secondary | ICD-10-CM

## 2020-07-19 DIAGNOSIS — I152 Hypertension secondary to endocrine disorders: Secondary | ICD-10-CM

## 2020-07-21 ENCOUNTER — Other Ambulatory Visit: Payer: Self-pay | Admitting: Family Medicine

## 2020-07-21 ENCOUNTER — Telehealth: Payer: Self-pay

## 2020-07-21 DIAGNOSIS — E1159 Type 2 diabetes mellitus with other circulatory complications: Secondary | ICD-10-CM

## 2020-07-21 DIAGNOSIS — I152 Hypertension secondary to endocrine disorders: Secondary | ICD-10-CM

## 2020-07-21 DIAGNOSIS — E1169 Type 2 diabetes mellitus with other specified complication: Secondary | ICD-10-CM

## 2020-07-21 NOTE — Telephone Encounter (Signed)
I do not know that we ever prescribed it, we must have given it to her as a sample, please ask her what dose she is doing and we can go ahead and send a refill for her.

## 2020-07-21 NOTE — Telephone Encounter (Signed)
Basaglar is a long-acting insulin like Tyler Aas but not the same brand or same medicine, would have to know what dose she is on of the [place] and cancel out the other 1 if that is what she is on currently, also make sure she is not taking both.

## 2020-07-21 NOTE — Telephone Encounter (Signed)
It looks like pt is on Basaglar-aren't those the same thing?

## 2020-07-21 NOTE — Telephone Encounter (Signed)
Did you take patient off tresiba?

## 2020-07-24 MED ORDER — TRESIBA FLEXTOUCH 100 UNIT/ML ~~LOC~~ SOPN: 40.0000 [IU] | PEN_INJECTOR | Freq: Every day | SUBCUTANEOUS | 3 refills | Status: DC

## 2020-07-24 NOTE — Telephone Encounter (Signed)
Patient aware and verbalizes understanding- states that she is only taking [place] 40 units. Requesting a refill of losartan hctz 100/98m- not on medication list Please review and advise

## 2020-07-24 NOTE — Telephone Encounter (Signed)
Patient notified and verbalized understanding. She has an appt this Friday and will bring all meds to appt

## 2020-07-24 NOTE — Telephone Encounter (Signed)
Let the patient know that I sent the Tyler Aas, have canceled out the Basaglar that she was on before, I have that she is on just losartan and not losartan hydrochlorothiazide, we likely need to get her a visit so we can figure out and go through her med list and clear everything out because it sounds like there is a lot of confusion.  Please schedule a visit for this. Caryl Pina, MD [facility] 07/24/2020, 4:06 PM

## 2020-07-28 ENCOUNTER — Encounter: Payer: Self-pay | Admitting: Family Medicine

## 2020-07-28 ENCOUNTER — Ambulatory Visit (INDEPENDENT_AMBULATORY_CARE_PROVIDER_SITE_OTHER): Payer: Medicare Other | Admitting: Family Medicine

## 2020-07-28 ENCOUNTER — Other Ambulatory Visit: Payer: Self-pay

## 2020-07-28 VITALS — BP 117/68 | HR 80 | Ht 63.0 in | Wt 171.0 lb

## 2020-07-28 DIAGNOSIS — G5601 Carpal tunnel syndrome, right upper limb: Secondary | ICD-10-CM | POA: Diagnosis not present

## 2020-07-28 DIAGNOSIS — I152 Hypertension secondary to endocrine disorders: Secondary | ICD-10-CM

## 2020-07-28 DIAGNOSIS — E1159 Type 2 diabetes mellitus with other circulatory complications: Secondary | ICD-10-CM | POA: Diagnosis not present

## 2020-07-28 DIAGNOSIS — E785 Hyperlipidemia, unspecified: Secondary | ICD-10-CM

## 2020-07-28 DIAGNOSIS — E1169 Type 2 diabetes mellitus with other specified complication: Secondary | ICD-10-CM

## 2020-07-28 DIAGNOSIS — Z794 Long term (current) use of insulin: Secondary | ICD-10-CM | POA: Diagnosis not present

## 2020-07-28 LAB — BAYER DCA HB A1C WAIVED: HB A1C (BAYER DCA - WAIVED): 7.4 % — ABNORMAL HIGH (ref <7.0)

## 2020-07-28 MED ORDER — NYSTATIN 100000 UNIT/GM EX POWD: 1.0000 "application " | Freq: Three times a day (TID) | CUTANEOUS | 3 refills | Status: DC

## 2020-07-28 MED ORDER — GLYXAMBI 25-5 MG PO TABS: 1.0000 | ORAL_TABLET | Freq: Every day | ORAL | 3 refills | Status: DC

## 2020-07-28 MED ORDER — LOSARTAN POTASSIUM 25 MG PO TABS
25.0000 mg | ORAL_TABLET | Freq: Every day | ORAL | 3 refills | Status: DC
Start: 2020-07-28 — End: 2021-07-27

## 2020-07-28 MED ORDER — ATORVASTATIN CALCIUM 40 MG PO TABS: 40.0000 mg | ORAL_TABLET | Freq: Every day | ORAL | 3 refills | Status: DC

## 2020-07-28 NOTE — Progress Notes (Signed)
BP 117/68   Pulse 80   Ht 5' 3"  (1.6 m)   Wt 171 lb (77.6 kg)   SpO2 98%   BMI 30.29 kg/m    Subjective:   Patient ID: Felicia Pugh, female    DOB: 10-Mar-1946, 74 y.o.   MRN: [id number]  HPI: Felicia Pugh is a 74 y.o. female presenting on 07/28/2020 for Medical Management of Chronic Issues and Diabetes   HPI Type 2 diabetes mellitus Patient comes in today for recheck of his diabetes. Patient has been currently taking Glyxambi and Tresiba 40. Patient is currently on an ACE inhibitor/ARB. Patient has seen an ophthalmologist this year. Patient denies any issues with their feet. The symptom started onset as an adult hypertension hyperlipidemia ARE RELATED TO DM   Hypertension Patient is currently on losartan hydrochlorothiazide but has been out of it for 2 weeks and her blood pressure is perfectly normal, and their blood pressure today is 117/68. Patient denies any lightheadedness or dizziness. Patient denies headaches, blurred vision, chest pains, shortness of breath, or weakness. Denies any side effects from medication and is content with current medication.   Hyperlipidemia Patient is coming in for recheck of his hyperlipidemia. The patient is currently taking atorvastatin. They deny any issues with myalgias or history of liver damage from it. They deny any focal numbness or weakness or chest pain.   Patient complains of some tingling and numbness in the fingers of her right hand, specifically worse at night.  She says that this is been bothering her more and she is wearing a brace at night but wonders what else could be done or what is the cause of it.  She denies any weakness but does have trouble opening jars with that but does not drop things.  She uses her other hand to open jars.  She denies any pain with it.  She denies any neck shoulder or upper arm pain.  Relevant past medical, surgical, family and social history reviewed and updated as indicated. Interim medical history  since our last visit reviewed. Allergies and medications reviewed and updated.  Review of Systems  Constitutional: Negative for chills and fever.  Eyes: Negative for visual disturbance.  Respiratory: Negative for chest tightness and shortness of breath.   Cardiovascular: Negative for chest pain and leg swelling.  Musculoskeletal: Negative for back pain, gait problem, myalgias and neck pain.  Skin: Negative for rash.  Neurological: Positive for numbness. Negative for weakness, light-headedness and headaches.  Psychiatric/Behavioral: Negative for agitation and behavioral problems.  All other systems reviewed and are negative.   Per HPI unless specifically indicated above   Allergies as of 07/28/2020      Reactions   Metformin And Related    Severe diarrhea   Asa [aspirin]    GI Dr does not want her to take due to hx ulcers   Nsaids    GI Dr does not want her to take due to hx of ulcers   Other Diarrhea   Steroids   Tolmetin Other (See Comments)   GI Dr does not want her to take due to hx of ulcers GI Dr does not want her to take due to hx of ulcers      Medication List       Accurate as of July 28, 2020 10:10 AM. If you have any questions, ask your nurse or doctor.        atorvastatin 40 MG tablet Commonly known as: LIPITOR Take 1 tablet (  40 mg total) by mouth daily.   balsalazide 750 MG capsule Commonly known as: COLAZAL Take 3 capsules (2,250 mg total) by mouth 3 (three) times daily.   cholecalciferol 25 MCG (1000 UNIT) tablet Commonly known as: VITAMIN D3 Take 1,000 Units by mouth daily.   Glyxambi 25-5 MG Tabs Generic drug: Empagliflozin-linaGLIPtin Take 1 tablet by mouth daily.   Insulin Syringe-Needle U-100 31G X 1/4" 1 ML Misc 1 Syringe by Does not apply route 2 (two) times daily.   IRON 27 PO Take by mouth daily.   loperamide 1 MG/5ML solution Commonly known as: IMODIUM Take 1 mg by mouth as needed for diarrhea or loose stools.   losartan 25  MG tablet Commonly known as: COZAAR Take 1 tablet (25 mg total) by mouth daily. What changed:   medication strength  how much to take Changed by: Worthy Rancher, MD   OneTouch Verio test strip Generic drug: glucose blood USE 1 STRIP TO CHECK GLUCOSE TWICE DAILY   Pen Needles 31G X 5 MM Misc Use to give insulin with flexpen   therapeutic multivitamin-minerals tablet Take 1 tablet by mouth daily.   Tyler Aas FlexTouch 100 UNIT/ML FlexTouch Pen Generic drug: insulin degludec Inject 40 Units into the skin daily.   vitamin C 250 MG tablet Commonly known as: ASCORBIC ACID Take 250 mg by mouth daily.        Objective:   BP 117/68   Pulse 80   Ht 5' 3"  (1.6 m)   Wt 171 lb (77.6 kg)   SpO2 98%   BMI 30.29 kg/m   Wt Readings from Last 3 Encounters:  07/28/20 171 lb (77.6 kg)  07/04/20 170 lb 9.6 oz (77.4 kg)  05/01/20 170 lb (77.1 kg)    Physical Exam Vitals and nursing note reviewed.  Constitutional:      General: She is not in acute distress.    Appearance: She is well-developed and well-nourished. She is not diaphoretic.  Eyes:     Extraocular Movements: EOM normal.     Conjunctiva/sclera: Conjunctivae normal.  Cardiovascular:     Rate and Rhythm: Normal rate and regular rhythm.     Pulses: Intact distal pulses.     Heart sounds: Normal heart sounds. No murmur heard.   Pulmonary:     Effort: Pulmonary effort is normal. No respiratory distress.     Breath sounds: Normal breath sounds. No wheezing.  Musculoskeletal:        General: No tenderness or edema. Normal range of motion.     Comments: Positive Tinel's sign in the right hand  Skin:    General: Skin is warm and dry.     Findings: No rash.  Neurological:     Mental Status: She is alert and oriented to person, place, and time.     Coordination: Coordination normal.  Psychiatric:        Mood and Affect: Mood and affect normal.        Behavior: Behavior normal.       Assessment & Plan:    Problem List Items Addressed This Visit      Cardiovascular and Mediastinum   Hypertension associated with diabetes ([place])   Relevant Medications   losartan (COZAAR) 25 MG tablet   Empagliflozin-linaGLIPtin (GLYXAMBI) 25-5 MG TABS   atorvastatin (LIPITOR) 40 MG tablet     Endocrine   Hyperlipidemia associated with type 2 diabetes mellitus (HCC)   Relevant Medications   losartan (COZAAR) 25 MG tablet   Empagliflozin-linaGLIPtin (  GLYXAMBI) 25-5 MG TABS   atorvastatin (LIPITOR) 40 MG tablet   Type 2 diabetes mellitus with other specified complication (HCC) - Primary   Relevant Medications   losartan (COZAAR) 25 MG tablet   Empagliflozin-linaGLIPtin (GLYXAMBI) 25-5 MG TABS   atorvastatin (LIPITOR) 40 MG tablet   Other Relevant Orders   Bayer DCA Hb A1c Waived    Other Visit Diagnoses    Carpal tunnel syndrome on right          Will check A1c today, follow-up in 3 months  Patient wants to hold off doing anything more significant for carpal tunnel, she will continue with her brace and ice and use anti-inflammatories as needed topically. Follow up plan: Return in about 3 months (around 10/26/2020), or if symptoms worsen or fail to improve, for diabetes and htn and hld.  Counseling provided for all of the vaccine components Orders Placed This Encounter  Procedures  . [facility] Hb A1c [place], MD [facility] 07/28/2020, 10:10 AM

## 2020-07-31 ENCOUNTER — Ambulatory Visit: Payer: Medicare Other | Admitting: Family Medicine

## 2020-08-09 ENCOUNTER — Other Ambulatory Visit: Payer: Self-pay

## 2020-08-09 ENCOUNTER — Ambulatory Visit
Admission: RE | Admit: 2020-08-09 | Discharge: 2020-08-09 | Disposition: A | Discharge status: Home / Self Care | Source: Ambulatory Visit | Attending: Family Medicine | Admitting: Family Medicine

## 2020-08-09 DIAGNOSIS — Z1231 Encounter for screening mammogram for malignant neoplasm of breast: Secondary | ICD-10-CM | POA: Diagnosis not present

## 2020-09-29 ENCOUNTER — Ambulatory Visit (INDEPENDENT_AMBULATORY_CARE_PROVIDER_SITE_OTHER): Payer: Medicare Other

## 2020-09-29 ENCOUNTER — Encounter: Payer: Self-pay | Admitting: Family Medicine

## 2020-09-29 ENCOUNTER — Other Ambulatory Visit: Payer: Self-pay

## 2020-09-29 ENCOUNTER — Ambulatory Visit (INDEPENDENT_AMBULATORY_CARE_PROVIDER_SITE_OTHER): Payer: Medicare Other | Admitting: Family Medicine

## 2020-09-29 VITALS — BP 112/69 | HR 80 | Temp 97.3°F | Ht 63.0 in | Wt 173.8 lb

## 2020-09-29 DIAGNOSIS — M25551 Pain in right hip: Secondary | ICD-10-CM | POA: Diagnosis not present

## 2020-09-29 DIAGNOSIS — M160 Bilateral primary osteoarthritis of hip: Secondary | ICD-10-CM | POA: Diagnosis not present

## 2020-09-29 DIAGNOSIS — M7631 Iliotibial band syndrome, right leg: Secondary | ICD-10-CM

## 2020-09-29 DIAGNOSIS — G5603 Carpal tunnel syndrome, bilateral upper limbs: Secondary | ICD-10-CM | POA: Diagnosis not present

## 2020-09-29 NOTE — Progress Notes (Signed)
BP 112/69   Pulse 80   Temp (!) 97.3 F (36.3 C) (Temporal)   Ht 5' 3"  (1.6 m)   Wt 173 lb 12.8 oz (78.8 kg)   SpO2 97%   BMI 30.79 kg/m    Subjective:   Patient ID: Felicia Pugh, female    DOB: May 26, 1946, 75 y.o.   MRN: [id number]  HPI: Felicia Pugh is a 75 y.o. female presenting on 09/29/2020 for Hip Pain (Right x 2-3 weeks, losing balance/), Numbness (Hands), and Knee Pain (Right)   HPI Right hip pain Patient comes in complaining of right hip pain and has led to right knee pain.  She says the right hip is been bothering her over the past 3 weeks, she does also have some pain in the groin of the left hip and pain in the groin on the right hip but also pain posterior on the right hip and pain laterally down to her knee on the right hip as well.  She says she feels like she is losing balance and strength when she is walking sometimes.  It starts to hurt more when she is on her feet for prolonged periods but also hurts when she is sitting for long periods and she has to get up and move.  She says laying on that side helps but laying on the other side does not.  She denies any numbness or weakness in that leg or the other leg  Patient complains of tingling and numbness that is intermittent in the hands on both hands but worse on the right hand, worse on the first 3 or 4 fingertips and dropping some things because she cannot feel them in that right hand.  She has been using a carpal tunnel brace at night.  Relevant past medical, surgical, family and social history reviewed and updated as indicated. Interim medical history since our last visit reviewed. Allergies and medications reviewed and updated.  Review of Systems  Constitutional: Negative for chills and fever.  Eyes: Negative for visual disturbance.  Respiratory: Negative for chest tightness and shortness of breath.   Cardiovascular: Negative for chest pain and leg swelling.  Musculoskeletal: Positive for arthralgias. Negative  for back pain and gait problem.  Skin: Negative for rash.  Neurological: Positive for numbness. Negative for weakness, light-headedness and headaches.  Psychiatric/Behavioral: Negative for agitation and behavioral problems.  All other systems reviewed and are negative.   Per HPI unless specifically indicated above   Allergies as of 09/29/2020      Reactions   Metformin And Related    Severe diarrhea   Asa [aspirin]    GI Dr does not want her to take due to hx ulcers   Nsaids    GI Dr does not want her to take due to hx of ulcers   Other Diarrhea   Steroids   Tolmetin Other (See Comments)   GI Dr does not want her to take due to hx of ulcers GI Dr does not want her to take due to hx of ulcers      Medication List       Accurate as of September 29, 2020  8:31 AM. If you have any questions, ask your nurse or doctor.        atorvastatin 40 MG tablet Commonly known as: LIPITOR Take 1 tablet (40 mg total) by mouth daily.   balsalazide 750 MG capsule Commonly known as: COLAZAL Take 3 capsules (2,250 mg total) by mouth 3 (three)  times daily.   cholecalciferol 25 MCG (1000 UNIT) tablet Commonly known as: VITAMIN D3 Take 1,000 Units by mouth daily.   Glyxambi 25-5 MG Tabs Generic drug: Empagliflozin-linaGLIPtin Take 1 tablet by mouth daily.   Insulin Syringe-Needle U-100 31G X 1/4" 1 ML Misc 1 Syringe by Does not apply route 2 (two) times daily.   IRON 27 PO Take by mouth daily.   loperamide 1 MG/5ML solution Commonly known as: IMODIUM Take 1 mg by mouth as needed for diarrhea or loose stools.   losartan 25 MG tablet Commonly known as: COZAAR Take 1 tablet (25 mg total) by mouth daily.   nystatin powder Commonly known as: MYCOSTATIN/NYSTOP Apply 1 application topically 3 (three) times daily.   OneTouch Verio test strip Generic drug: glucose blood USE 1 STRIP TO CHECK GLUCOSE TWICE DAILY   Pen Needles 31G X 5 MM Misc Use to give insulin with flexpen    therapeutic multivitamin-minerals tablet Take 1 tablet by mouth daily.   Tyler Aas FlexTouch 100 UNIT/ML FlexTouch Pen Generic drug: insulin degludec Inject 40 Units into the skin daily.   vitamin C 250 MG tablet Commonly known as: ASCORBIC ACID Take 250 mg by mouth daily.        Objective:   BP 112/69   Pulse 80   Temp (!) 97.3 F (36.3 C) (Temporal)   Ht 5' 3"  (1.6 m)   Wt 173 lb 12.8 oz (78.8 kg)   SpO2 97%   BMI 30.79 kg/m   Wt Readings from Last 3 Encounters:  09/29/20 173 lb 12.8 oz (78.8 kg)  07/28/20 171 lb (77.6 kg)  07/04/20 170 lb 9.6 oz (77.4 kg)    Physical Exam Vitals and nursing note reviewed.  Constitutional:      General: She is not in acute distress.    Appearance: She is well-developed and well-nourished. She is not diaphoretic.  Eyes:     Extraocular Movements: EOM normal.     Conjunctiva/sclera: Conjunctivae normal.  Cardiovascular:     Rate and Rhythm: Normal rate and regular rhythm.     Pulses: Intact distal pulses.     Heart sounds: Normal heart sounds. No murmur heard.   Pulmonary:     Effort: Pulmonary effort is normal. No respiratory distress.     Breath sounds: Normal breath sounds. No wheezing.  Musculoskeletal:        General: No edema.  Skin:    General: Skin is warm and dry.     Findings: No rash.  Neurological:     Mental Status: She is alert and oriented to person, place, and time.     Coordination: Coordination normal.  Psychiatric:        Mood and Affect: Mood and affect normal.        Behavior: Behavior normal.       Assessment & Plan:   Problem List Items Addressed This Visit   None   Visit Diagnoses    Carpal tunnel syndrome, bilateral    -  Primary   Relevant Orders   Ambulatory referral to Orthopedic Surgery   Bilateral primary osteoarthritis of hip       Relevant Orders   DG HIP UNILAT W OR W/O PELVIS 2-3 VIEWS RIGHT   It band syndrome, right          Gave a list exercises and stretches that she  could do to strengthen and stretch out the IT band.  Also recommended Voltaren gel over-the-counter.  If her hips  are still bothering her enough after that that she would like to go see an orthopedic for the arthritis and we could do that as well.  Right hip x-ray: Pending read from radiology Follow up plan: Return if symptoms worsen or fail to improve.  Counseling provided for all of the vaccine components No orders of the defined types were placed in this encounter.   Caryl Pina, MD [facility] 09/29/2020, 8:31 AM

## 2020-10-17 DIAGNOSIS — G5602 Carpal tunnel syndrome, left upper limb: Secondary | ICD-10-CM | POA: Diagnosis not present

## 2020-10-17 DIAGNOSIS — G5601 Carpal tunnel syndrome, right upper limb: Secondary | ICD-10-CM | POA: Diagnosis not present

## 2020-10-17 DIAGNOSIS — G5603 Carpal tunnel syndrome, bilateral upper limbs: Secondary | ICD-10-CM | POA: Insufficient documentation

## 2020-10-30 ENCOUNTER — Ambulatory Visit (INDEPENDENT_AMBULATORY_CARE_PROVIDER_SITE_OTHER): Payer: Medicare Other | Admitting: Family Medicine

## 2020-10-30 ENCOUNTER — Other Ambulatory Visit: Payer: Self-pay

## 2020-10-30 ENCOUNTER — Encounter: Payer: Self-pay | Admitting: Family Medicine

## 2020-10-30 VITALS — BP 128/69 | HR 76 | Ht 63.0 in | Wt 177.0 lb

## 2020-10-30 DIAGNOSIS — I152 Hypertension secondary to endocrine disorders: Secondary | ICD-10-CM | POA: Diagnosis not present

## 2020-10-30 DIAGNOSIS — E1159 Type 2 diabetes mellitus with other circulatory complications: Secondary | ICD-10-CM | POA: Diagnosis not present

## 2020-10-30 DIAGNOSIS — Z794 Long term (current) use of insulin: Secondary | ICD-10-CM | POA: Diagnosis not present

## 2020-10-30 DIAGNOSIS — E1169 Type 2 diabetes mellitus with other specified complication: Secondary | ICD-10-CM

## 2020-10-30 DIAGNOSIS — E785 Hyperlipidemia, unspecified: Secondary | ICD-10-CM

## 2020-10-30 DIAGNOSIS — B351 Tinea unguium: Secondary | ICD-10-CM | POA: Diagnosis not present

## 2020-10-30 DIAGNOSIS — M5431 Sciatica, right side: Secondary | ICD-10-CM

## 2020-10-30 LAB — BAYER DCA HB A1C WAIVED: HB A1C (BAYER DCA - WAIVED): 8 % — ABNORMAL HIGH (ref <7.0)

## 2020-10-30 NOTE — Progress Notes (Signed)
BP 128/69   Pulse 76   Ht 5' 3"  (1.6 m)   Wt 177 lb (80.3 kg)   SpO2 97%   BMI 31.35 kg/m    Subjective:   Patient ID: Felicia Pugh, female    DOB: 01/12/1946, 75 y.o.   MRN: [id number]  HPI: Felicia Pugh is a 75 y.o. female presenting on 10/30/2020 for Medical Management of Chronic Issues, Diabetes, and Hip Pain (Radiates down right leg)   HPI Type 2 diabetes mellitus Patient comes in today for recheck of his diabetes. Patient has been currently taking Glyxambi and [place]. Patient is currently on an ACE inhibitor/ARB. Patient has seen an ophthalmologist this year. Patient has thickened toenails and difficulty cutting them by herself.. The symptom started onset as an adult hypertension and hyperlipidemia ARE RELATED TO DM   Hypertension Patient is currently on losartan, and their blood pressure today is 128/69. Patient denies any lightheadedness or dizziness. Patient denies headaches, blurred vision, chest pains, shortness of breath, or weakness. Denies any side effects from medication and is content with current medication.   Hyperlipidemia Patient is coming in for recheck of his hyperlipidemia. The patient is currently taking atorvastatin.  They deny any issues with myalgias or history of liver damage from it. They deny any focal numbness or weakness or chest pain.   Patient continues to have right sciatic nerve pain on the right side of her posterior head down below back of her right leg.  She has been doing some exercise with an x-ray.  She has been using Voltaren gel and it does help some, she will discuss with her orthopedic and see if they have somebody in the group that she can see for this.  Relevant past medical, surgical, family and social history reviewed and updated as indicated. Interim medical history since our last visit reviewed. Allergies and medications reviewed and updated.  Review of Systems  Constitutional: Negative for chills and fever.  Respiratory:  Negative for chest tightness and shortness of breath.   Cardiovascular: Negative for chest pain and leg swelling.  Genitourinary: Negative for difficulty urinating and dysuria.  Musculoskeletal: Positive for arthralgias, back pain and myalgias. Negative for gait problem.  Skin: Negative for rash.  Neurological: Negative for light-headedness and headaches.  Psychiatric/Behavioral: Negative for agitation and behavioral problems.  All other systems reviewed and are negative.   Per HPI unless specifically indicated above   Allergies as of 10/30/2020      Reactions   Metformin And Related    Severe diarrhea   Asa [aspirin]    GI Dr does not want her to take due to hx ulcers   Nsaids    GI Dr does not want her to take due to hx of ulcers   Other Diarrhea   Steroids   Tolmetin Other (See Comments)   GI Dr does not want her to take due to hx of ulcers GI Dr does not want her to take due to hx of ulcers      Medication List       Accurate as of October 30, 2020  9:48 AM. If you have any questions, ask your nurse or doctor.        atorvastatin 40 MG tablet Commonly known as: LIPITOR Take 1 tablet (40 mg total) by mouth daily.   balsalazide 750 MG capsule Commonly known as: COLAZAL Take 3 capsules (2,250 mg total) by mouth 3 (three) times daily.   cholecalciferol 25 MCG (1000 UNIT)  tablet Commonly known as: VITAMIN D3 Take 1,000 Units by mouth daily.   Glyxambi 25-5 MG Tabs Generic drug: Empagliflozin-linaGLIPtin Take 1 tablet by mouth daily.   Insulin Syringe-Needle U-100 31G X 1/4" 1 ML Misc 1 Syringe by Does not apply route 2 (two) times daily.   IRON 27 PO Take by mouth daily.   loperamide 1 MG/5ML solution Commonly known as: IMODIUM Take 1 mg by mouth as needed for diarrhea or loose stools.   losartan 25 MG tablet Commonly known as: COZAAR Take 1 tablet (25 mg total) by mouth daily.   nystatin powder Commonly known as: MYCOSTATIN/NYSTOP Apply 1 application  topically 3 (three) times daily.   OneTouch Verio test strip Generic drug: glucose blood USE 1 STRIP TO CHECK GLUCOSE TWICE DAILY   Pen Needles 31G X 5 MM Misc Use to give insulin with flexpen   therapeutic multivitamin-minerals tablet Take 1 tablet by mouth daily.   Tyler Aas FlexTouch 100 UNIT/ML FlexTouch Pen Generic drug: insulin degludec Inject 40 Units into the skin daily.   vitamin C 250 MG tablet Commonly known as: ASCORBIC ACID Take 250 mg by mouth daily.        Objective:   BP 128/69   Pulse 76   Ht 5' 3"  (1.6 m)   Wt 177 lb (80.3 kg)   SpO2 97%   BMI 31.35 kg/m   Wt Readings from Last 3 Encounters:  10/30/20 177 lb (80.3 kg)  09/29/20 173 lb 12.8 oz (78.8 kg)  07/28/20 171 lb (77.6 kg)    Physical Exam Vitals and nursing note reviewed.  Constitutional:      General: She is not in acute distress.    Appearance: She is well-developed. She is not diaphoretic.  Eyes:     Conjunctiva/sclera: Conjunctivae normal.  Cardiovascular:     Rate and Rhythm: Normal rate and regular rhythm.     Heart sounds: Normal heart sounds. No murmur heard.   Pulmonary:     Effort: Pulmonary effort is normal. No respiratory distress.     Breath sounds: Normal breath sounds. No wheezing.  Musculoskeletal:        General: No tenderness. Normal range of motion.  Skin:    General: Skin is warm and dry.     Findings: No rash.     Comments: Thickened and yellow toenails, difficult to cut by herself  Neurological:     Mental Status: She is alert and oriented to person, place, and time.     Coordination: Coordination normal.  Psychiatric:        Behavior: Behavior normal.       Assessment & Plan:   Problem List Items Addressed This Visit      Cardiovascular and Mediastinum   Hypertension associated with diabetes ([place])   Relevant Orders   CBC with Differential/Platelet   CMP14+EGFR   Lipid panel   Bayer DCA Hb A1c Waived     Endocrine   Hyperlipidemia associated  with type 2 diabetes mellitus ([place])   Relevant Orders   CBC with Differential/Platelet   CMP14+EGFR   Lipid panel   Bayer DCA Hb A1c Waived   Type 2 diabetes mellitus with other specified complication ([place]) - Primary   Relevant Orders   CBC with Differential/Platelet   CMP14+EGFR   Lipid panel   Bayer DCA Hb A1c Waived    Other Visit Diagnoses    Right sciatic nerve pain       Onychomycosis  Relevant Orders   Ambulatory referral to Podiatry      She will continue with Voltaren gel for her right-sided nerve pain in her sciatica and will discuss with her orthopedic.  Refer to podiatry for her toenails. Follow up plan: Return in about 3 months (around 01/30/2021), or if symptoms worsen or fail to improve, for Diabetes hypertension and cholesterol.  Counseling provided for all of the vaccine components Orders Placed This Encounter  Procedures  . CBC with Differential/Platelet  . CMP14+EGFR  . Lipid panel  . Bayer DCA Hb A1c Waived  . Ambulatory referral to [place] Dettinger, MD [facility] 10/30/2020, 9:48 AM

## 2020-10-31 ENCOUNTER — Telehealth: Payer: Self-pay | Admitting: Family Medicine

## 2020-10-31 LAB — CBC WITH DIFFERENTIAL/PLATELET
Basophils Absolute: 0.1 10*3/uL (ref 0.0–0.2)
Basos: 1 %
EOS (ABSOLUTE): 0.1 10*3/uL (ref 0.0–0.4)
Eos: 1 %
Hematocrit: 38.9 % (ref 34.0–46.6)
Hemoglobin: 12.3 g/dL (ref 11.1–15.9)
Immature Grans (Abs): 0.1 10*3/uL (ref 0.0–0.1)
Immature Granulocytes: 1 %
Lymphocytes Absolute: 1.7 10*3/uL (ref 0.7–3.1)
Lymphs: 18 %
MCH: 27.9 pg (ref 26.6–33.0)
MCHC: 31.6 g/dL (ref 31.5–35.7)
MCV: 88 fL (ref 79–97)
Monocytes Absolute: 0.7 10*3/uL (ref 0.1–0.9)
Monocytes: 7 %
Neutrophils Absolute: 6.8 10*3/uL (ref 1.4–7.0)
Neutrophils: 72 %
Platelets: 272 10*3/uL (ref 150–450)
RBC: 4.41 x10E6/uL (ref 3.77–5.28)
RDW: 13.6 % (ref 11.7–15.4)
WBC: 9.4 10*3/uL (ref 3.4–10.8)

## 2020-10-31 LAB — CMP14+EGFR
ALT: 12 IU/L (ref 0–32)
AST: 16 IU/L (ref 0–40)
Albumin/Globulin Ratio: 1.6 (ref 1.2–2.2)
Albumin: 4.1 g/dL (ref 3.7–4.7)
Alkaline Phosphatase: 105 IU/L (ref 44–121)
BUN/Creatinine Ratio: 25 (ref 12–28)
BUN: 17 mg/dL (ref 8–27)
Bilirubin Total: 0.3 mg/dL (ref 0.0–1.2)
CO2: 24 mmol/L (ref 20–29)
Calcium: 9.2 mg/dL (ref 8.7–10.3)
Chloride: 108 mmol/L — ABNORMAL HIGH (ref 96–106)
Creatinine, Ser: 0.69 mg/dL (ref 0.57–1.00)
Globulin, Total: 2.6 g/dL (ref 1.5–4.5)
Glucose: 76 mg/dL (ref 65–99)
Potassium: 4.3 mmol/L (ref 3.5–5.2)
Sodium: 146 mmol/L — ABNORMAL HIGH (ref 134–144)
Total Protein: 6.7 g/dL (ref 6.0–8.5)
eGFR: 91 mL/min/{1.73_m2} (ref >59)

## 2020-10-31 LAB — LIPID PANEL
Chol/HDL Ratio: 2.1 ratio (ref 0.0–4.4)
Cholesterol, Total: 175 mg/dL (ref 100–199)
HDL: 84 mg/dL (ref >39)
LDL Chol Calc (NIH): 79 mg/dL (ref 0–99)
Triglycerides: 59 mg/dL (ref 0–149)
VLDL Cholesterol Cal: 12 mg/dL (ref 5–40)

## 2020-11-14 DIAGNOSIS — E1142 Type 2 diabetes mellitus with diabetic polyneuropathy: Secondary | ICD-10-CM | POA: Diagnosis not present

## 2020-11-14 DIAGNOSIS — B351 Tinea unguium: Secondary | ICD-10-CM | POA: Diagnosis not present

## 2020-11-14 DIAGNOSIS — M79676 Pain in unspecified toe(s): Secondary | ICD-10-CM | POA: Diagnosis not present

## 2020-11-22 ENCOUNTER — Encounter: Payer: Self-pay | Admitting: Gastroenterology

## 2020-11-28 DIAGNOSIS — G5603 Carpal tunnel syndrome, bilateral upper limbs: Secondary | ICD-10-CM | POA: Diagnosis not present

## 2020-12-11 ENCOUNTER — Encounter: Payer: Self-pay | Admitting: Internal Medicine

## 2020-12-13 DIAGNOSIS — G5601 Carpal tunnel syndrome, right upper limb: Secondary | ICD-10-CM | POA: Diagnosis not present

## 2020-12-19 DIAGNOSIS — G5603 Carpal tunnel syndrome, bilateral upper limbs: Secondary | ICD-10-CM | POA: Diagnosis not present

## 2020-12-26 ENCOUNTER — Other Ambulatory Visit: Payer: Self-pay | Admitting: Family Medicine

## 2020-12-26 DIAGNOSIS — E1169 Type 2 diabetes mellitus with other specified complication: Secondary | ICD-10-CM

## 2021-01-02 ENCOUNTER — Ambulatory Visit: Admitting: Nurse Practitioner

## 2021-01-04 DIAGNOSIS — G5621 Lesion of ulnar nerve, right upper limb: Secondary | ICD-10-CM | POA: Diagnosis not present

## 2021-01-04 DIAGNOSIS — G8918 Other acute postprocedural pain: Secondary | ICD-10-CM | POA: Diagnosis not present

## 2021-01-04 DIAGNOSIS — G5601 Carpal tunnel syndrome, right upper limb: Secondary | ICD-10-CM | POA: Diagnosis not present

## 2021-01-05 ENCOUNTER — Ambulatory Visit: Admitting: Gastroenterology

## 2021-01-16 DIAGNOSIS — M79601 Pain in right arm: Secondary | ICD-10-CM | POA: Insufficient documentation

## 2021-01-24 ENCOUNTER — Telehealth: Payer: Self-pay | Admitting: Family Medicine

## 2021-01-24 NOTE — Telephone Encounter (Signed)
Pt is scheduled for 6/23 at 10:55am

## 2021-01-25 ENCOUNTER — Encounter: Payer: Self-pay | Admitting: Internal Medicine

## 2021-01-30 ENCOUNTER — Ambulatory Visit: Payer: Medicare Other | Admitting: Family Medicine

## 2021-01-30 DIAGNOSIS — M79601 Pain in right arm: Secondary | ICD-10-CM | POA: Diagnosis not present

## 2021-02-01 ENCOUNTER — Other Ambulatory Visit: Payer: Self-pay

## 2021-02-01 ENCOUNTER — Encounter: Payer: Self-pay | Admitting: Family Medicine

## 2021-02-01 ENCOUNTER — Ambulatory Visit (INDEPENDENT_AMBULATORY_CARE_PROVIDER_SITE_OTHER): Payer: Medicare Other | Admitting: Family Medicine

## 2021-02-01 VITALS — BP 139/77 | HR 80 | Ht 63.0 in | Wt 172.0 lb

## 2021-02-01 DIAGNOSIS — Z794 Long term (current) use of insulin: Secondary | ICD-10-CM | POA: Diagnosis not present

## 2021-02-01 DIAGNOSIS — E785 Hyperlipidemia, unspecified: Secondary | ICD-10-CM

## 2021-02-01 DIAGNOSIS — E1159 Type 2 diabetes mellitus with other circulatory complications: Secondary | ICD-10-CM | POA: Diagnosis not present

## 2021-02-01 DIAGNOSIS — E1169 Type 2 diabetes mellitus with other specified complication: Secondary | ICD-10-CM

## 2021-02-01 DIAGNOSIS — I152 Hypertension secondary to endocrine disorders: Secondary | ICD-10-CM

## 2021-02-01 LAB — BAYER DCA HB A1C WAIVED: HB A1C (BAYER DCA - WAIVED): 7.4 % — ABNORMAL HIGH (ref <7.0)

## 2021-02-01 MED ORDER — TRESIBA FLEXTOUCH 100 UNIT/ML ~~LOC~~ SOPN: 15.0000 [IU] | PEN_INJECTOR | Freq: Every day | SUBCUTANEOUS | 3 refills | Status: DC

## 2021-02-01 NOTE — Progress Notes (Signed)
BP 139/77   Pulse 80   Ht 5' 3"  (1.6 m)   Wt 172 lb (78 kg)   SpO2 99%   BMI 30.47 kg/m    Subjective:   Patient ID: Felicia Pugh, female    DOB: 1945-12-03, 75 y.o.   MRN: [id number]  HPI: Felicia Pugh is a 75 y.o. female presenting on 02/01/2021 for Medical Management of Chronic Issues, Diabetes, and Hypertension   HPI Type 2 diabetes mellitus Patient comes in today for recheck of his diabetes. Patient has been currently taking [place] and Glyxambi. Patient is currently on an ACE inhibitor/ARB. Patient has seen an ophthalmologist this year. Patient denies any issues with their feet. The symptom started onset as an adult hypertension and hyperlipidemia ARE RELATED TO DM   Hypertension Patient is currently on losartan, and their blood pressure today is 139/77. Patient denies any lightheadedness or dizziness. Patient denies headaches, blurred vision, chest pains, shortness of breath, or weakness. Denies any side effects from medication and is content with current medication.   Hyperlipidemia Patient is coming in for recheck of his hyperlipidemia. The patient is currently taking atorvastatin. They deny any issues with myalgias or history of liver damage from it. They deny any focal numbness or weakness or chest pain.   Relevant past medical, surgical, family and social history reviewed and updated as indicated. Interim medical history since our last visit reviewed. Allergies and medications reviewed and updated.  Review of Systems  Constitutional:  Negative for chills and fever.  Eyes:  Negative for visual disturbance.  Respiratory:  Negative for chest tightness and shortness of breath.   Cardiovascular:  Negative for chest pain and leg swelling.  Musculoskeletal:  Negative for back pain and gait problem.  Skin:  Negative for rash.  Neurological:  Negative for light-headedness and headaches.  Psychiatric/Behavioral:  Negative for agitation and behavioral problems.   All other  systems reviewed and are negative.  Per HPI unless specifically indicated above   Allergies as of 02/01/2021       Reactions   Metformin And Related    Severe diarrhea   Asa [aspirin]    GI Dr does not want her to take due to hx ulcers   Nsaids    GI Dr does not want her to take due to hx of ulcers   Other Diarrhea   Steroids   Tolmetin Other (See Comments)   GI Dr does not want her to take due to hx of ulcers GI Dr does not want her to take due to hx of ulcers        Medication List        Accurate as of February 01, 2021 11:30 AM. If you have any questions, ask your nurse or doctor.          atorvastatin 40 MG tablet Commonly known as: LIPITOR Take 1 tablet (40 mg total) by mouth daily.   balsalazide 750 MG capsule Commonly known as: COLAZAL Take 3 capsules (2,250 mg total) by mouth 3 (three) times daily.   cholecalciferol 25 MCG (1000 UNIT) tablet Commonly known as: VITAMIN D3 Take 1,000 Units by mouth daily.   Glyxambi 25-5 MG Tabs Generic drug: Empagliflozin-linaGLIPtin Take 1 tablet by mouth daily.   Insulin Syringe-Needle U-100 31G X 1/4" 1 ML Misc 1 Syringe by Does not apply route 2 (two) times daily.   IRON 27 PO Take by mouth daily.   loperamide 1 MG/5ML solution Commonly known as: IMODIUM Take  1 mg by mouth as needed for diarrhea or loose stools.   losartan 25 MG tablet Commonly known as: COZAAR Take 1 tablet (25 mg total) by mouth daily.   nystatin powder Commonly known as: MYCOSTATIN/NYSTOP Apply 1 application topically 3 (three) times daily.   OneTouch Verio test strip Generic drug: glucose blood USE 1 STRIP TO CHECK GLUCOSE TWICE DAILY   Pen Needles 31G X 5 MM Misc Use to give insulin with flexpen   therapeutic multivitamin-minerals tablet Take 1 tablet by mouth daily.   Tyler Aas FlexTouch 100 UNIT/ML FlexTouch Pen Generic drug: insulin degludec INJECT 40 UNITS SUBCUTANEOUSLY ONCE DAILY   vitamin C 250 MG tablet Commonly known  as: ASCORBIC ACID Take 250 mg by mouth daily.         Objective:   BP 139/77   Pulse 80   Ht 5' 3"  (1.6 m)   Wt 172 lb (78 kg)   SpO2 99%   BMI 30.47 kg/m   Wt Readings from Last 3 Encounters:  02/01/21 172 lb (78 kg)  10/30/20 177 lb (80.3 kg)  09/29/20 173 lb 12.8 oz (78.8 kg)    Physical Exam Vitals and nursing note reviewed.  Constitutional:      General: She is not in acute distress.    Appearance: She is well-developed. She is not diaphoretic.  Eyes:     Conjunctiva/sclera: Conjunctivae normal.  Cardiovascular:     Rate and Rhythm: Normal rate and regular rhythm.     Heart sounds: Normal heart sounds. No murmur heard. Pulmonary:     Effort: Pulmonary effort is normal. No respiratory distress.     Breath sounds: Normal breath sounds. No wheezing.  Musculoskeletal:        General: No tenderness. Normal range of motion.  Skin:    General: Skin is warm and dry.     Findings: No rash.  Neurological:     Mental Status: She is alert and oriented to person, place, and time.     Coordination: Coordination normal.  Psychiatric:        Behavior: Behavior normal.      Assessment & Plan:   Problem List Items Addressed This Visit       Cardiovascular and Mediastinum   Hypertension associated with diabetes ([place])     Endocrine   Hyperlipidemia associated with type 2 diabetes mellitus ([place])   Type 2 diabetes mellitus with other specified complication ([place]) - Primary   Relevant Orders   Bayer DCA Hb A1c Waived   Patient's A1c is 7.4 which is up slightly but she had a surgery was running a little higher during that time.,  Not changing medicines right now but focus on diet and exercise.    Follow up plan: Return in about 3 months (around 05/04/2021), or if symptoms worsen or fail to improve, for Diabetes and hypertension cholesterol.  Counseling provided for all of the vaccine components Orders Placed This Encounter  Procedures   Bayer [place] Hb A1c [place] Jarmon, MD [facility] 02/01/2021, 11:30 AM

## 2021-03-06 DIAGNOSIS — L84 Corns and callosities: Secondary | ICD-10-CM | POA: Diagnosis not present

## 2021-03-06 DIAGNOSIS — M79676 Pain in unspecified toe(s): Secondary | ICD-10-CM | POA: Diagnosis not present

## 2021-03-06 DIAGNOSIS — E1142 Type 2 diabetes mellitus with diabetic polyneuropathy: Secondary | ICD-10-CM | POA: Diagnosis not present

## 2021-03-06 DIAGNOSIS — B351 Tinea unguium: Secondary | ICD-10-CM | POA: Diagnosis not present

## 2021-03-12 ENCOUNTER — Ambulatory Visit: Admitting: Gastroenterology

## 2021-03-14 ENCOUNTER — Ambulatory Visit: Payer: Medicare Other | Admitting: Family Medicine

## 2021-03-22 ENCOUNTER — Telehealth: Payer: Self-pay | Admitting: Family Medicine

## 2021-03-22 ENCOUNTER — Telehealth: Payer: Self-pay

## 2021-03-22 DIAGNOSIS — E1169 Type 2 diabetes mellitus with other specified complication: Secondary | ICD-10-CM

## 2021-03-22 DIAGNOSIS — Z794 Long term (current) use of insulin: Secondary | ICD-10-CM

## 2021-03-22 MED ORDER — TRESIBA FLEXTOUCH 100 UNIT/ML ~~LOC~~ SOPN: 40.0000 [IU] | PEN_INJECTOR | Freq: Every day | SUBCUTANEOUS | 3 refills | Status: DC

## 2021-03-22 NOTE — Telephone Encounter (Signed)
Called patient no answer and no voicemail

## 2021-03-22 NOTE — Telephone Encounter (Signed)
Returning nurse call.

## 2021-03-22 NOTE — Telephone Encounter (Signed)
Felicia Pugh flextouch  Directions states use 15 units daily  Patient states she should be on 40 units   Advise and send new rx if needed

## 2021-03-22 NOTE — Addendum Note (Signed)
Addended by: Caryl Pina on: 03/22/2021 03:28 PM   Modules accepted: Orders

## 2021-03-22 NOTE — Telephone Encounter (Signed)
Left message for pt to return call.

## 2021-03-22 NOTE — Telephone Encounter (Signed)
Resent medication dose of 40 units.

## 2021-03-23 NOTE — Telephone Encounter (Signed)
Refer to previous phone call

## 2021-05-04 ENCOUNTER — Encounter: Payer: Self-pay | Admitting: Family Medicine

## 2021-05-04 ENCOUNTER — Ambulatory Visit (INDEPENDENT_AMBULATORY_CARE_PROVIDER_SITE_OTHER): Payer: Medicare Other | Admitting: Family Medicine

## 2021-05-04 ENCOUNTER — Other Ambulatory Visit: Payer: Self-pay

## 2021-05-04 VITALS — BP 140/83 | HR 78 | Ht 63.0 in | Wt 172.0 lb

## 2021-05-04 DIAGNOSIS — Z794 Long term (current) use of insulin: Secondary | ICD-10-CM | POA: Diagnosis not present

## 2021-05-04 DIAGNOSIS — E785 Hyperlipidemia, unspecified: Secondary | ICD-10-CM | POA: Diagnosis not present

## 2021-05-04 DIAGNOSIS — K219 Gastro-esophageal reflux disease without esophagitis: Secondary | ICD-10-CM

## 2021-05-04 DIAGNOSIS — E1159 Type 2 diabetes mellitus with other circulatory complications: Secondary | ICD-10-CM | POA: Diagnosis not present

## 2021-05-04 DIAGNOSIS — D638 Anemia in other chronic diseases classified elsewhere: Secondary | ICD-10-CM | POA: Diagnosis not present

## 2021-05-04 DIAGNOSIS — Z23 Encounter for immunization: Secondary | ICD-10-CM

## 2021-05-04 DIAGNOSIS — E1140 Type 2 diabetes mellitus with diabetic neuropathy, unspecified: Secondary | ICD-10-CM | POA: Diagnosis not present

## 2021-05-04 DIAGNOSIS — I152 Hypertension secondary to endocrine disorders: Secondary | ICD-10-CM | POA: Diagnosis not present

## 2021-05-04 DIAGNOSIS — E1169 Type 2 diabetes mellitus with other specified complication: Secondary | ICD-10-CM

## 2021-05-04 LAB — CBC WITH DIFFERENTIAL/PLATELET
Basophils Absolute: 0.1 10*3/uL (ref 0.0–0.2)
Basos: 1 %
EOS (ABSOLUTE): 0.1 10*3/uL (ref 0.0–0.4)
Eos: 1 %
Hematocrit: 39.7 % (ref 34.0–46.6)
Hemoglobin: 12.4 g/dL (ref 11.1–15.9)
Immature Grans (Abs): 0 10*3/uL (ref 0.0–0.1)
Immature Granulocytes: 0 %
Lymphocytes Absolute: 1.3 10*3/uL (ref 0.7–3.1)
Lymphs: 16 %
MCH: 27.4 pg (ref 26.6–33.0)
MCHC: 31.2 g/dL — ABNORMAL LOW (ref 31.5–35.7)
MCV: 88 fL (ref 79–97)
Monocytes Absolute: 0.6 10*3/uL (ref 0.1–0.9)
Monocytes: 7 %
Neutrophils Absolute: 6.4 10*3/uL (ref 1.4–7.0)
Neutrophils: 75 %
Platelets: 302 10*3/uL (ref 150–450)
RBC: 4.52 x10E6/uL (ref 3.77–5.28)
RDW: 13.2 % (ref 11.7–15.4)
WBC: 8.4 10*3/uL (ref 3.4–10.8)

## 2021-05-04 LAB — LIPID PANEL
Chol/HDL Ratio: 1.9 ratio (ref 0.0–4.4)
Cholesterol, Total: 178 mg/dL (ref 100–199)
HDL: 93 mg/dL (ref >39)
LDL Chol Calc (NIH): 75 mg/dL (ref 0–99)
Triglycerides: 51 mg/dL (ref 0–149)
VLDL Cholesterol Cal: 10 mg/dL (ref 5–40)

## 2021-05-04 LAB — CMP14+EGFR
ALT: 12 IU/L (ref 0–32)
AST: 19 IU/L (ref 0–40)
Albumin/Globulin Ratio: 1.9 (ref 1.2–2.2)
Albumin: 4.5 g/dL (ref 3.7–4.7)
Alkaline Phosphatase: 121 IU/L (ref 44–121)
BUN/Creatinine Ratio: 19 (ref 12–28)
BUN: 14 mg/dL (ref 8–27)
Bilirubin Total: 0.4 mg/dL (ref 0.0–1.2)
CO2: 25 mmol/L (ref 20–29)
Calcium: 9.5 mg/dL (ref 8.7–10.3)
Chloride: 106 mmol/L (ref 96–106)
Creatinine, Ser: 0.73 mg/dL (ref 0.57–1.00)
Globulin, Total: 2.4 g/dL (ref 1.5–4.5)
Glucose: 96 mg/dL (ref 65–99)
Potassium: 4.2 mmol/L (ref 3.5–5.2)
Sodium: 145 mmol/L — ABNORMAL HIGH (ref 134–144)
Total Protein: 6.9 g/dL (ref 6.0–8.5)
eGFR: 86 mL/min/{1.73_m2} (ref >59)

## 2021-05-04 LAB — BAYER DCA HB A1C WAIVED: HB A1C (BAYER DCA - WAIVED): 7.5 % — ABNORMAL HIGH (ref 4.8–5.6)

## 2021-05-04 NOTE — Progress Notes (Signed)
BP 140/83   Pulse 78   Ht 5' 3"  (1.6 m)   Wt 172 lb (78 kg)   SpO2 100%   BMI 30.47 kg/m    Subjective:   Patient ID: Felicia Pugh, female    DOB: 24-Aug-1945, 75 y.o.   MRN: [id number]  HPI: Felicia Pugh is a 75 y.o. female presenting on 05/04/2021 for Medical Management of Chronic Issues, Diabetes, and Hypertension   HPI Type 2 diabetes mellitus Patient comes in today for recheck of his diabetes. Patient has been currently taking [place] 60 units and Glyxambi. Patient is currently on an ACE inhibitor/ARB. Patient has seen an ophthalmologist this year. Patient states she is having some numbness in the bottom of her feet occasionally worse in the mornings.. The symptom started onset as an adult hypertension hyperlipidemia and neuropathy ARE RELATED TO DM   Hypertension Patient is currently on losartan, and their blood pressure today is 140/83. Patient denies any lightheadedness or dizziness. Patient denies headaches, blurred vision, chest pains, shortness of breath, or weakness. Denies any side effects from medication and is content with current medication.   Hyperlipidemia Patient is coming in for recheck of his hyperlipidemia. The patient is currently taking atorvastatin. They deny any issues with myalgias or history of liver damage from it. They deny any focal numbness or weakness or chest pain.   GERD and anemia recheck and Crohn's Patient is currently on balsalazide.  She denies any major symptoms or abdominal pain or belching or burping. She denies any blood in her stool or lightheadedness or dizziness.  Has gastroenterology  Relevant past medical, surgical, family and social history reviewed and updated as indicated. Interim medical history since our last visit reviewed. Allergies and medications reviewed and updated.  Review of Systems  Constitutional:  Negative for chills and fever.  HENT:  Negative for congestion, ear discharge and ear pain.   Eyes:  Negative for  redness and visual disturbance.  Respiratory:  Negative for chest tightness and shortness of breath.   Cardiovascular:  Negative for chest pain and leg swelling.  Genitourinary:  Negative for difficulty urinating and dysuria.  Musculoskeletal:  Negative for back pain and gait problem.  Skin:  Negative for rash.  Neurological:  Negative for light-headedness and headaches.  Psychiatric/Behavioral:  Negative for agitation and behavioral problems.   All other systems reviewed and are negative.  Per HPI unless specifically indicated above   Allergies as of 05/04/2021       Reactions   Metformin And Related    Severe diarrhea   Asa [aspirin]    GI Dr does not want her to take due to hx ulcers   Nsaids    GI Dr does not want her to take due to hx of ulcers   Other Diarrhea   Steroids   Tolmetin Other (See Comments)   GI Dr does not want her to take due to hx of ulcers GI Dr does not want her to take due to hx of ulcers        Medication List        Accurate as of May 04, 2021  9:28 AM. If you have any questions, ask your nurse or doctor.          atorvastatin 40 MG tablet Commonly known as: LIPITOR Take 1 tablet (40 mg total) by mouth daily.   balsalazide 750 MG capsule Commonly known as: COLAZAL Take 3 capsules (2,250 mg total) by mouth 3 (three) times  daily.   cholecalciferol 25 MCG (1000 UNIT) tablet Commonly known as: VITAMIN D3 Take 1,000 Units by mouth daily.   Glyxambi 25-5 MG Tabs Generic drug: Empagliflozin-linaGLIPtin Take 1 tablet by mouth daily.   Insulin Syringe-Needle U-100 31G X 1/4" 1 ML Misc 1 Syringe by Does not apply route 2 (two) times daily.   IRON 27 PO Take by mouth daily.   loperamide 1 MG/5ML solution Commonly known as: IMODIUM Take 1 mg by mouth as needed for diarrhea or loose stools.   losartan 25 MG tablet Commonly known as: COZAAR Take 1 tablet (25 mg total) by mouth daily.   nystatin powder Commonly known as:  MYCOSTATIN/NYSTOP Apply 1 application topically 3 (three) times daily.   OneTouch Verio test strip Generic drug: glucose blood USE 1 STRIP TO CHECK GLUCOSE TWICE DAILY   Pen Needles 31G X 5 MM Misc Use to give insulin with flexpen   therapeutic multivitamin-minerals tablet Take 1 tablet by mouth daily.   Tyler Aas FlexTouch 100 UNIT/ML FlexTouch Pen Generic drug: insulin degludec Inject 40 Units into the skin daily.   vitamin C 250 MG tablet Commonly known as: ASCORBIC ACID Take 250 mg by mouth daily.         Objective:   BP 140/83   Pulse 78   Ht 5' 3"  (1.6 m)   Wt 172 lb (78 kg)   SpO2 100%   BMI 30.47 kg/m   Wt Readings from Last 3 Encounters:  05/04/21 172 lb (78 kg)  02/01/21 172 lb (78 kg)  10/30/20 177 lb (80.3 kg)    Physical Exam Vitals and nursing note reviewed.  Constitutional:      General: She is not in acute distress.    Appearance: She is well-developed. She is not diaphoretic.  Eyes:     Conjunctiva/sclera: Conjunctivae normal.  Cardiovascular:     Rate and Rhythm: Normal rate and regular rhythm.     Heart sounds: Normal heart sounds. No murmur heard. Pulmonary:     Effort: Pulmonary effort is normal. No respiratory distress.     Breath sounds: Normal breath sounds. No wheezing.  Musculoskeletal:        General: No swelling or tenderness. Normal range of motion.  Skin:    General: Skin is warm and dry.     Findings: No rash.  Neurological:     Mental Status: She is alert and oriented to person, place, and time.     Coordination: Coordination normal.  Psychiatric:        Behavior: Behavior normal.    Results for orders placed or performed in visit on 02/01/21  Bayer DCA Hb A1c Waived  Result Value Ref Range   HB A1C (BAYER DCA - WAIVED) 7.4 (H) <7.0 %    Assessment & Plan:   Problem List Items Addressed This Visit       Cardiovascular and Mediastinum   Hypertension associated with diabetes ([place])   Relevant Orders   CBC with  Differential/Platelet   CMP14+EGFR   Lipid panel   Bayer DCA Hb A1c Waived     Digestive   GERD (gastroesophageal reflux disease)     Endocrine   Hyperlipidemia associated with type 2 diabetes mellitus ([place])   Relevant Orders   CBC with Differential/Platelet   CMP14+EGFR   Lipid panel   Bayer DCA Hb A1c Waived   Type 2 diabetes mellitus with other specified complication ([place]) - Primary   Relevant Orders   CBC with Differential/Platelet  CMP14+EGFR   Lipid panel   Bayer DCA Hb A1c Waived     Other   Anemia, chronic disease   Other Visit Diagnoses     Need for immunization against influenza       Relevant Orders   Flu Vaccine QUAD High Dose(Fluad) (Completed)   Type 2 diabetes mellitus with diabetic neuropathy, with long-term current use of insulin (H[facility])           Continue current medicine, A1c is much improved at 7.5.  No changes and watch diet closely. Follow up plan: Return in about 3 months (around 08/03/2021), or if symptoms worsen or fail to improve, for Diabetes and hypertension and cholesterol.  Counseling provided for all of the vaccine components Orders Placed This Encounter  Procedures   Flu Vaccine QUAD High Dose(Fluad)   CBC with Differential/Platelet   CMP14+EGFR   Lipid panel   Bayer DCA Hb A1c Waived    Caryl Pina, MD [facility] 05/04/2021, 9:28 AM

## 2021-05-14 ENCOUNTER — Other Ambulatory Visit: Payer: Self-pay | Admitting: Nurse Practitioner

## 2021-05-14 DIAGNOSIS — K219 Gastro-esophageal reflux disease without esophagitis: Secondary | ICD-10-CM

## 2021-05-15 DIAGNOSIS — M79676 Pain in unspecified toe(s): Secondary | ICD-10-CM | POA: Diagnosis not present

## 2021-05-15 DIAGNOSIS — E1142 Type 2 diabetes mellitus with diabetic polyneuropathy: Secondary | ICD-10-CM | POA: Diagnosis not present

## 2021-05-15 DIAGNOSIS — B351 Tinea unguium: Secondary | ICD-10-CM | POA: Diagnosis not present

## 2021-05-15 DIAGNOSIS — L84 Corns and callosities: Secondary | ICD-10-CM | POA: Diagnosis not present

## 2021-05-24 DIAGNOSIS — M4316 Spondylolisthesis, lumbar region: Secondary | ICD-10-CM | POA: Diagnosis not present

## 2021-05-24 DIAGNOSIS — M7061 Trochanteric bursitis, right hip: Secondary | ICD-10-CM | POA: Diagnosis not present

## 2021-05-24 DIAGNOSIS — M4326 Fusion of spine, lumbar region: Secondary | ICD-10-CM | POA: Diagnosis not present

## 2021-05-24 DIAGNOSIS — M4726 Other spondylosis with radiculopathy, lumbar region: Secondary | ICD-10-CM | POA: Diagnosis not present

## 2021-05-30 ENCOUNTER — Telehealth: Payer: Self-pay

## 2021-05-30 ENCOUNTER — Ambulatory Visit (INDEPENDENT_AMBULATORY_CARE_PROVIDER_SITE_OTHER): Payer: Medicare Other

## 2021-05-30 VITALS — Ht 63.0 in | Wt 174.0 lb

## 2021-05-30 DIAGNOSIS — E1169 Type 2 diabetes mellitus with other specified complication: Secondary | ICD-10-CM

## 2021-05-30 DIAGNOSIS — Z0000 Encounter for general adult medical examination without abnormal findings: Secondary | ICD-10-CM

## 2021-05-30 DIAGNOSIS — H9313 Tinnitus, bilateral: Secondary | ICD-10-CM

## 2021-05-30 DIAGNOSIS — H9193 Unspecified hearing loss, bilateral: Secondary | ICD-10-CM

## 2021-05-30 DIAGNOSIS — Z0001 Encounter for general adult medical examination with abnormal findings: Secondary | ICD-10-CM | POA: Diagnosis not present

## 2021-05-30 DIAGNOSIS — Z794 Long term (current) use of insulin: Secondary | ICD-10-CM | POA: Diagnosis not present

## 2021-05-30 NOTE — Progress Notes (Signed)
Subjective:   Felicia Pugh is a 75 y.o. female who presents for Medicare Annual (Subsequent) preventive examination.  Virtual Visit via Telephone Note  I connected with  Felicia Pugh on 05/30/21 at 10:30 AM EDT by telephone and verified that I am speaking with the correct person using two identifiers.  Location: Patient: Home Provider: [facility] Persons participating in the virtual visit: patient/Nurse Health Advisor   I discussed the limitations, risks, security and privacy concerns of performing an evaluation and management service by telephone and the availability of in person appointments. The patient expressed understanding and agreed to proceed.  Interactive audio and video telecommunications were attempted between this nurse and patient, however failed, due to patient having technical difficulties OR patient did not have access to video capability.  We continued and completed visit with audio only.  Some vital Pugh may be absent or patient reported.   [name], LPN   Review of Systems     Cardiac Risk Factors include: advanced age (>64mn, >>58women);diabetes mellitus;dyslipidemia;hypertension;obesity (BMI >30kg/m2)     Objective:    Today's Vitals   05/30/21 1026  Weight: 174 lb (78.9 kg)  Height: 5' 3"  (1.6 m)  PainSc: 4    Body mass index is 30.82 kg/m.  Advanced Directives 05/30/2021 05/24/2020 05/24/2019 05/19/2018 05/15/2017 05/01/2017 03/17/2017  Does Patient Have a Medical Advance Directive? No No;Yes Yes Yes No No No  Type of Advance Directive - Healthcare Power of Felicia Pugh Does patient want to make changes to medical advance directive? - No - Patient declined No - Patient declined Yes (MAU/Ambulatory/Procedural Areas - Information given) - - -  Copy of [place]sayin Chart? - No - copy requested No - copy requested No - copy requested - - -  Would patient like information on  creating a medical advance directive? No - Patient declined No - Patient declined - - No - Patient declined Yes (MAU/Ambulatory/Procedural Areas - Information given) No - Patient declined    Current Medications (verified) Outpatient Encounter Medications as of 05/30/2021  Medication Sig   atorvastatin (LIPITOR) 40 MG tablet Take 1 tablet (40 mg total) by mouth daily.   balsalazide (COLAZAL) 750 MG capsule TAKE 3 CAPSULES BY MOUTH THREE TIMES DAILY   cholecalciferol (VITAMIN D3) 25 MCG (1000 UNIT) tablet Take 1,000 Units by mouth daily.   Empagliflozin-linaGLIPtin (GLYXAMBI) 25-5 MG TABS Take 1 tablet by mouth daily.   Ferrous Gluconate (IRON 27 PO) Take by mouth daily.   glucose blood (ONETOUCH VERIO) test strip USE 1 STRIP TO CHECK GLUCOSE TWICE DAILY   HYDROcodone-acetaminophen (NORCO/VICODIN) 5-325 MG tablet Take by mouth.   insulin degludec (TRESIBA FLEXTOUCH) 100 UNIT/ML FlexTouch Pen Inject 40 Units into the skin daily.   Insulin Pen Needle (PEN NEEDLES) 31G X 5 MM MISC Use to give insulin with flexpen   Insulin Syringe-Needle U-100 31G X 1/4" 1 ML MISC 1 Syringe by Does not apply route 2 (two) times daily.   loperamide (IMODIUM) 1 MG/5ML solution Take 1 mg by mouth as needed for diarrhea or loose stools.   losartan (COZAAR) 25 MG tablet Take 1 tablet (25 mg total) by mouth daily.   nystatin (MYCOSTATIN/NYSTOP) powder Apply 1 application topically 3 (three) times daily.   therapeutic multivitamin-minerals (THERAGRAN-M) tablet Take 1 tablet by mouth daily.   vitamin C (ASCORBIC ACID) 250 MG tablet Take 250 mg by mouth daily.  No facility-administered encounter medications on file as of 05/30/2021.    Allergies (verified) Metformin and related, Asa [aspirin], Nsaids, Other, and Tolmetin   History: Past Medical History:  Diagnosis Date   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 Sonny Pugh, no longer seeing anyone (02/2015)   Cataract    Complication of anesthesia     difficulty breathing after waking up from anesthesia   Crohn's disease (Felicia Pugh)    Diabetes mellitus without complication (Felicia Pugh)    Diabetic neuropathy (Felicia Pugh)    Hyperlipidemia    Hypertension    IBS (irritable bowel syndrome)    Stroke Felicia Pugh) 2011   Syncope and collapse    Thyroid nodule    Ulcer    Past Surgical History:  Procedure Laterality Date   ABDOMINAL HYSTERECTOMY     partial   BACK SURGERY     x3 (Dr Loyola Mast, Dr Patrice Paradise)   BREAST SURGERY Right 2002   cyst removed   COLONOSCOPY  10/102006   RMR: normal   COLONOSCOPY  09/06/2009   RMR: Suboptimal prep on the right side. Ulcers noted at cecum and in the descending colon. Terminal ileum could not be intubated. Ounces with chronic active colitis, question Crohn's   COLONOSCOPY N/A 03/27/2015   Dr. Gala Pugh: abnormal cecum and IC valve most consistent with IBD. TI intubated and appeared normal. Likely Crohn's disease. Patient denies NSAID use. No improvement with Lialda samples at time of initial diagnosis   COLONOSCOPY N/A 05/15/2017   Dr. Gala Pugh: ab normal IC valve, s/p biopsy with active ileitis, diverticulosis in sigmoid and descending colon. Non-bleeding internal hemorrhoids   ESOPHAGOGASTRODUODENOSCOPY     HAND SURGERY Right    Carpel tunnel and right elbow   LAPAROSCOPIC APPENDECTOMY N/A 07/21/2016   Procedure: APPENDECTOMY LAPAROSCOPIC;  Surgeon: Felicia Signs, MD;  Location: [facility];  Service: General;  Laterality: N/A;   Small bowel capsule endoscopy  06/10/2005   RMR: single erosion versus AVM, distal ileum   Small bowel capsule endoscopy  prior to 05/2005   [place]: reported erosions/ulcerations per medical record. actual report unavailable.   Family History  Problem Relation Age of Onset   Hypertension Mother    Stroke Mother    Diabetes Father        w/ BKA   Hypertension Father    Asthma Father    Cancer Sister    Diabetes Sister        stomach   Breast cancer Sister    Diabetes Brother    Diabetes Sister     Cancer Sister        breast   Hypertension Sister    Hypertension Daughter    Hypertension Son    Multiple sclerosis Son    Colon cancer Neg Hx    Inflammatory bowel disease Neg Hx    Social History   Socioeconomic History   Marital status: Married    Spouse name: Herbie [place]   Number of children: 5   Years of education: 9th grade-then got her GED at [facility]   Highest education level: GED or equivalent  Occupational History   Occupation: retired  Tobacco Use   Smoking status: Never   Smokeless tobacco: Never  Vaping Use   Vaping Use: Never used  Substance and Sexual Activity   Alcohol use: No   Drug use: No   Sexual activity: Not Currently  Other Topics Concern   Not on file  Social History Narrative   Lives home with her  husband - he is on dialysis - she helps care for him   Their children live nearby   She babysits her 71 year old grandchild   Social Determinants of Radio broadcast assistant Strain: Low Risk    Difficulty of Paying Living Expenses: Not hard at all  Food Insecurity: No Food Insecurity   Worried About Charity fundraiser in the Last Year: Never true   Arboriculturist in the Last Year: Never true  Transportation Needs: No Transportation Needs   Lack of Transportation (Medical): No   Lack of Transportation (Non-Medical): No  Physical Activity: Insufficiently Active   Days of Exercise per Week: 7 days   Minutes of Exercise per Session: 20 min  Stress: No Stress Concern Present   Feeling of Stress : Not at all  Social Connections: Socially Integrated   Frequency of Communication with Friends and Family: More than three times a week   Frequency of Social Gatherings with Friends and Family: More than three times a week   Attends Religious Services: More than 4 times per year   Active Member of [employer] or Organizations: Yes   Attends Music therapist: More than 4 times per year   Marital Status: Married    Tobacco Counseling Counseling given:  Not Answered   Clinical Intake:  Pre-visit preparation completed: Yes  Pain : 0-10 Pain Score: 4  Pain Type: Chronic pain Pain Location: Back Pain Descriptors / Indicators: Aching, Discomfort, Sharp Pain Onset: More than a month ago Pain Frequency: Intermittent     BMI - recorded: 30.82 Nutritional Status: BMI > 30  Obese Nutritional Risks: Nausea/ vomitting/ diarrhea (Crohn's/ chronic diarrhea) Diabetes: Yes CBG done?: No Did pt. bring in CBG monitor from home?: No  How often do you need to have someone help you when you read instructions, pamphlets, or other written materials from your doctor or pharmacy?: 1 - Never  Diabetic? Yes Nutrition Risk Assessment:  Has the patient had any N/V/D within the last 2 months?  Yes  Does the patient have any non-healing wounds?  No  Has the patient had any unintentional weight loss or weight gain?  No   Diabetes:  Is the patient diabetic?  Yes  If diabetic, was a CBG obtained today?  No  Did the patient bring in their glucometer from home?  No  How often do you monitor your CBG's? BID.   Financial Strains and Diabetes Management:  Are you having any financial strains with the device, your supplies or your medication? No .  Does the patient want to be seen by Chronic Care Management for management of their diabetes?  No  Would the patient like to be referred to a Nutritionist or for Diabetic Management?  Yes   Diabetic Exams:  Diabetic Eye Exam: Completed 08/21/2020.  Diabetic Foot Exam: Completed 10/30/2020. Pt has been advised about the importance in completing this exam. Pt is scheduled for diabetic foot exam on next year.    Interpreter Needed?: No  Information entered by :: Benigno Check, LPN   Activities of Daily Living In your present state of health, do you have any difficulty performing the following activities: 05/30/2021  Hearing? Y  Vision? N  Difficulty concentrating or making decisions? N  Walking or climbing  stairs? Y  Dressing or bathing? N  Doing errands, shopping? N  Preparing Food and eating ? N  Using the Toilet? N  In the past six months, have you  accidently leaked urine? N  Do you have problems with loss of bowel control? N  Managing your Medications? N  Managing your Finances? N  Housekeeping or managing your Housekeeping? N  Some recent data might be hidden    Patient Care Team: Dettinger, Fransisca Kaufmann, MD as PCP - General (Family Medicine) Felicia Pugh Cristopher Estimable, MD as Consulting Physician (Gastroenterology) Annitta Needs, NP (Gastroenterology) Verner Mould, MD as Consulting Physician (Orthopedic Surgery) Steffanie Rainwater, DPM as Consulting Physician (Podiatry) Suella Broad, MD as Consulting Physician (Physical Medicine and Rehabilitation)  Indicate any recent Medical Services you may have received from other than [facility] providers in the past year (date may be approximate).     Assessment:   This is a routine wellness examination for [place].  Hearing/Vision screen Hearing Screening - Comments:: C/o moderate hearing difficulties - no hearing aids Vision Screening - Comments:: Wears glasses for reading only - up to date with annual eye exams at [facility] issues and exercise activities discussed: Current Exercise Habits: Home exercise routine, Type of exercise: walking, Time (Minutes): 30, Frequency (Times/Week): 5, Weekly Exercise (Minutes/Week): 150, Intensity: Mild, Exercise limited by: neurologic condition(s);orthopedic condition(s)   Goals Addressed             This Visit's Progress    Exercise 3x per week (30 min per time)   On track      Depression Screen PHQ 2/9 Scores 05/30/2021 05/04/2021 02/01/2021 10/30/2020 09/29/2020 07/28/2020 05/01/2020  PHQ - 2 Score 0 0 0 0 0 0 0    Fall Risk Fall Risk  05/30/2021 05/04/2021 02/01/2021 10/30/2020 09/29/2020  Falls in the past year? 1 0 0 0 0  Number falls in past yr: 0 - - - 0  Injury with Fall? 0 - - - 0   Risk for fall due to : History of fall(s);Medication side effect;Other (Comment);Impaired balance/gait - - - -  Risk for fall due to: Comment neuropathy - - - -  Follow up Education provided;Falls prevention discussed - - - Falls evaluation completed  Comment - - - - -    FALL RISK PREVENTION PERTAINING TO THE HOME:  Any stairs in or around the home? No  If so, are there any without handrails? No  Home free of loose throw rugs in walkways, pet beds, electrical cords, etc? Yes  Adequate lighting in your home to reduce risk of falls? Yes   ASSISTIVE DEVICES UTILIZED TO PREVENT FALLS:  Life alert? No  Use of a cane, walker or w/c? No  Grab bars in the bathroom? Yes  Shower chair or bench in shower? Yes  Elevated toilet seat or a handicapped toilet? Yes   TIMED UP AND GO:  Was the test performed? No . Telephonic visit  Cognitive Function: Normal cognitive status assessed by direct observation by this Nurse Health Advisor. No abnormalities found.    MMSE - Mini Mental State Exam 05/19/2018 05/01/2017  Orientation to time 5 5  Orientation to Place 5 5  Registration 3 3  Attention/ Calculation 4 4  Recall 2 2  Language- name 2 objects 2 2  Language- repeat 1 1  Language- follow 3 step command 3 3  Language- read & follow direction 1 1  Write a sentence 1 1  Copy design 1 1  Total score 28 28     6CIT Screen 05/24/2020 05/24/2019  What Year? 0 points 0 points  What month? 0 points 0 points  What time? 0  points 0 points  Count back from 20 0 points 0 points  Months in reverse 0 points 0 points  Repeat phrase 0 points 0 points  Total Score 0 0    Immunizations Immunization History  Administered Date(s) Administered   Fluad Quad(high Dose 65+) 05/01/2020, 05/04/2021   Influenza, High Dose Seasonal PF 05/19/2018, 04/24/2019   Influenza,inj,Quad PF,6+ Mos 05/13/2013, 05/27/2014, 06/22/2015, 06/11/2016, 06/26/2017   Moderna Sars-Covid-2 Vaccination 09/23/2019, 10/26/2019,  06/26/2020, 12/27/2020   Pneumococcal Conjugate-13 07/08/2014   Pneumococcal Polysaccharide-23 12/16/2016   Tdap 12/30/2017   Zoster Recombinat (Shingrix) 07/14/2019    TDAP status: Up to date  Flu Vaccine status: Up to date  Pneumococcal vaccine status: Up to date  Covid-19 vaccine status: Completed vaccines  Qualifies for Shingles Vaccine? Yes   Zostavax completed Yes   Shingrix Completed?: No.    Education has been provided regarding the importance of this vaccine. Patient has been advised to call insurance company to determine out of pocket expense if they have not yet received this vaccine. Advised may also receive vaccine at local pharmacy or Health Dept. Verbalized acceptance and understanding.  Screening Tests Health Maintenance  Topic Date Due   DEXA SCAN  10/30/2018   Zoster Vaccines- Shingrix (2 of 2) 08/13/2021 (Originally 09/08/2019)   OPHTHALMOLOGY EXAM  08/21/2021   FOOT EXAM  10/30/2021   HEMOGLOBIN A1C  11/01/2021   COLONOSCOPY (Pts 45-110yr Insurance coverage will need to be confirmed)  05/16/2027   TETANUS/TDAP  12/31/2027   Pneumonia Vaccine 75 Years old  Completed   INFLUENZA VACCINE  Completed   COVID-19 Vaccine  Completed   Hepatitis C Screening  Completed   HPV VACCINES  Aged Out    Health Maintenance  Health Maintenance Due  Topic Date Due   DEXA SCAN  10/30/2018    Colorectal cancer screening: Type of screening: Colonoscopy. Completed 05/15/2017. Repeat every 10 years  Mammogram status: Completed 08/09/2020. Repeat every year  Bone Density status: Completed 10/29/2016. Results reflect: Bone density results: OSTEOPENIA. Repeat every 2 years.  Lung Cancer Screening: (Low Dose CT Chest recommended if Age 75-80years, 30 pack-year currently smoking OR have quit w/in 15years.) does not qualify  Additional Screening:  Hepatitis C Screening: does qualify; Completed 12/16/2016  Vision Screening: Recommended annual ophthalmology exams for early  detection of glaucoma and other disorders of the eye. Is the patient up to date with their annual eye exam?  Yes  Who is the provider or what is the name of the office in which the patient attends annual eye exams? [facility] If pt is not established with a provider, would they like to be referred to a provider to establish care? No .   Dental Screening: Recommended annual dental exams for proper oral hygiene  Community Resource Referral / Chronic Care Management: CRR required this visit?  No   CCM required this visit?  No      Plan:     I have personally reviewed and noted the following in the patient's chart:   Medical and social history Use of alcohol, tobacco or illicit drugs  Current medications and supplements including opioid prescriptions.  Functional ability and status Nutritional status Physical activity Advanced directives List of other physicians Hospitalizations, surgeries, and ER visits in previous 12 months Vitals Screenings to include cognitive, depression, and falls Referrals and appointments  In addition, I have reviewed and discussed with patient certain preventive protocols, quality metrics, and best practice recommendations. A written personalized care plan for  preventive services as well as general preventive health recommendations were provided to patient.     Sandrea Hammond, LPN   [id number]   Nurse Notes: referral sent for diabetic education at patient request

## 2021-05-30 NOTE — Patient Instructions (Signed)
Felicia Pugh , Thank you for taking time to come for your Medicare Wellness Visit. I appreciate your ongoing commitment to your health goals. Please review the following plan we discussed and let me know if I can assist you in the future.   Screening recommendations/referrals: Colonoscopy: Done 05/15/2017 - Repeat in 10 years Mammogram: Done 08/09/2020 - Repeat annually Bone Density: Done 10/29/2016 - Repeat every 2 years Recommended yearly ophthalmology/optometry visit for glaucoma screening and checkup Recommended yearly dental visit for hygiene and checkup  Vaccinations: Influenza vaccine: Done 05/04/2021 - Repeat annually Pneumococcal vaccine: Done 07/08/2014 & 12/16/2016 Tdap vaccine: Done 12/30/2017 - Repeat in 10 years Shingles vaccine: Done 07/14/2019 - due for second dose   Covid-19: Done 09/23/2019, 10/26/2019, 06/26/2020, & 12/27/2020  Advanced directives: Please bring a copy of your health care power of attorney and living will to the office to be added to your chart at your convenience.   Conditions/risks identified: Aim for 30 minutes of exercise or brisk walking each day, drink 6-8 glasses of water and eat lots of fruits and vegetables.   Next appointment: Follow up in one year for your annual wellness visit    Preventive Care 65 Years and Older, Female Preventive care refers to lifestyle choices and visits with your health care provider that can promote health and wellness. What does preventive care include? A yearly physical exam. This is also called an annual well check. Dental exams once or twice a year. Routine eye exams. Ask your health care provider how often you should have your eyes checked. Personal lifestyle choices, including: Daily care of your teeth and gums. Regular physical activity. Eating a healthy diet. Avoiding tobacco and drug use. Limiting alcohol use. Practicing safe sex. Taking low-dose aspirin every day. Taking vitamin and mineral supplements as  recommended by your health care provider. What happens during an annual well check? The services and screenings done by your health care provider during your annual well check will depend on your age, overall health, lifestyle risk factors, and family history of disease. Counseling  Your health care provider may ask you questions about your: Alcohol use. Tobacco use. Drug use. Emotional well-being. Home and relationship well-being. Sexual activity. Eating habits. History of falls. Memory and ability to understand (cognition). Work and work Statistician. Reproductive health. Screening  You may have the following tests or measurements: Height, weight, and BMI. Blood pressure. Lipid and cholesterol levels. These may be checked every 5 years, or more frequently if you are over 84 years old. Skin check. Lung cancer screening. You may have this screening every year starting at age 62 if you have a 30-pack-year history of smoking and currently smoke or have quit within the past 15 years. Fecal occult blood test (FOBT) of the stool. You may have this test every year starting at age 26. Flexible sigmoidoscopy or colonoscopy. You may have a sigmoidoscopy every 5 years or a colonoscopy every 10 years starting at age 9. Hepatitis C blood test. Hepatitis B blood test. Sexually transmitted disease (STD) testing. Diabetes screening. This is done by checking your blood sugar (glucose) after you have not eaten for a while (fasting). You may have this done every 1-3 years. Bone density scan. This is done to screen for osteoporosis. You may have this done starting at age 51. Mammogram. This may be done every 1-2 years. Talk to your health care provider about how often you should have regular mammograms. Talk with your health care provider about your test results,  treatment options, and if necessary, the need for more tests. Vaccines  Your health care provider may recommend certain vaccines, such  as: Influenza vaccine. This is recommended every year. Tetanus, diphtheria, and acellular pertussis (Tdap, Td) vaccine. You may need a Td booster every 10 years. Zoster vaccine. You may need this after age 18. Pneumococcal 13-valent conjugate (PCV13) vaccine. One dose is recommended after age 86. Pneumococcal polysaccharide (PPSV23) vaccine. One dose is recommended after age 11. Talk to your health care provider about which screenings and vaccines you need and how often you need them. This information is not intended to replace advice given to you by your health care provider. Make sure you discuss any questions you have with your health care provider. Document Released: 08/25/2015 Document Revised: 04/17/2016 Document Reviewed: 05/30/2015 Elsevier Interactive Patient Education  2017 [place] Prevention in the Home Falls can cause injuries. They can happen to people of all ages. There are many things you can do to make your home safe and to help prevent falls. What can I do on the outside of my home? Regularly fix the edges of walkways and driveways and fix any cracks. Remove anything that might make you trip as you walk through a door, such as a raised step or threshold. Trim any bushes or trees on the path to your home. Use bright outdoor lighting. Clear any walking paths of anything that might make someone trip, such as rocks or tools. Regularly check to see if handrails are loose or broken. Make sure that both sides of any steps have handrails. Any raised decks and porches should have guardrails on the edges. Have any leaves, snow, or ice cleared regularly. Use sand or salt on walking paths during winter. Clean up any spills in your garage right away. This includes oil or grease spills. What can I do in the bathroom? Use night lights. Install grab bars by the toilet and in the tub and shower. Do not use towel bars as grab bars. Use non-skid mats or decals in the tub or  shower. If you need to sit down in the shower, use a plastic, non-slip stool. Keep the floor dry. Clean up any water that spills on the floor as soon as it happens. Remove soap buildup in the tub or shower regularly. Attach bath mats securely with double-sided non-slip rug tape. Do not have throw rugs and other things on the floor that can make you trip. What can I do in the bedroom? Use night lights. Make sure that you have a light by your bed that is easy to reach. Do not use any sheets or blankets that are too big for your bed. They should not hang down onto the floor. Have a firm chair that has side arms. You can use this for support while you get dressed. Do not have throw rugs and other things on the floor that can make you trip. What can I do in the kitchen? Clean up any spills right away. Avoid walking on wet floors. Keep items that you use a lot in easy-to-reach places. If you need to reach something above you, use a strong step stool that has a grab bar. Keep electrical cords out of the way. Do not use floor polish or wax that makes floors slippery. If you must use wax, use non-skid floor wax. Do not have throw rugs and other things on the floor that can make you trip. What can I do with my stairs? Do  not leave any items on the stairs. Make sure that there are handrails on both sides of the stairs and use them. Fix handrails that are broken or loose. Make sure that handrails are as long as the stairways. Check any carpeting to make sure that it is firmly attached to the stairs. Fix any carpet that is loose or worn. Avoid having throw rugs at the top or bottom of the stairs. If you do have throw rugs, attach them to the floor with carpet tape. Make sure that you have a light switch at the top of the stairs and the bottom of the stairs. If you do not have them, ask someone to add them for you. What else can I do to help prevent falls? Wear shoes that: Do not have high heels. Have  rubber bottoms. Are comfortable and fit you well. Are closed at the toe. Do not wear sandals. If you use a stepladder: Make sure that it is fully opened. Do not climb a closed stepladder. Make sure that both sides of the stepladder are locked into place. Ask someone to hold it for you, if possible. Clearly mark and make sure that you can see: Any grab bars or handrails. First and last steps. Where the edge of each step is. Use tools that help you move around (mobility aids) if they are needed. These include: Canes. Walkers. Scooters. Crutches. Turn on the lights when you go into a dark area. Replace any light bulbs as soon as they burn out. Set up your furniture so you have a clear path. Avoid moving your furniture around. If any of your floors are uneven, fix them. If there are any pets around you, be aware of where they are. Review your medicines with your doctor. Some medicines can make you feel dizzy. This can increase your chance of falling. Ask your doctor what other things that you can do to help prevent falls. This information is not intended to replace advice given to you by your health care provider. Make sure you discuss any questions you have with your health care provider. Document Released: 05/25/2009 Document Revised: 01/04/2016 Document Reviewed: 09/02/2014 Elsevier Interactive Patient Education  2017 [employer].

## 2021-05-30 NOTE — Telephone Encounter (Signed)
She thinks she saw Dr Janace Hoard at [facility] 5-10 years ago, but never went back. Wants new referral please as her hearing is getting worse and she has ringing in the right ear. Thanks!

## 2021-05-31 ENCOUNTER — Other Ambulatory Visit (HOSPITAL_COMMUNITY): Payer: Self-pay | Admitting: Orthopaedic Surgery

## 2021-05-31 DIAGNOSIS — M4316 Spondylolisthesis, lumbar region: Secondary | ICD-10-CM

## 2021-05-31 DIAGNOSIS — M4326 Fusion of spine, lumbar region: Secondary | ICD-10-CM

## 2021-06-04 NOTE — Telephone Encounter (Signed)
Patient aware referral has been placed.

## 2021-06-06 NOTE — Progress Notes (Signed)
Referring Provider: Dettinger, Fransisca Kaufmann, MD Primary Care Physician:  Dettinger, Fransisca Kaufmann, MD Primary GI Physician: Dr. Gala Romney  Chief Complaint  Patient presents with   Crohn's Disease    F/u   Diarrhea    Several times per day, no blood in stool     HPI:   Felicia Pugh is a 75 y.o. 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 Also with history of osteopenia noted on DEXA scan in 2018.  She is presenting today for follow-up of Crohn's disease and chronic diarrhea.  Last colonoscopy in 2018 with focal active ileitis on biopsy, nonbleeding internal hemorrhoids.  Last seen in our office 07/04/2020 for follow-up.  She was doing okay overall.  Taking Colazal 3 capsules 3 times daily.  Still with diarrhea if she has to leave the house to go to [place] and gets anxious.  2 loose stools in the setting.  Imodium helps.  Dark stool on oral iron.  Denies abdominal pain, hematochezia, or any other significant GI symptoms.  Plan to continue current medications and update labs.  Labs completed 07/04/2020 with CBC and CMP within normal limits.  Most recent labs 05/04/2021 with CBC and CMP within normal limits aside from sodium 145.  Today: Doing well overall.  Continues with intermittent diarrhea usually if she is going out, specifically if she is traveling longer distance such as to [place] or [place] for her husband's doctors appointments.  On these days, she may have up to 3-6 loose bowel movements per day and uses 1-2 doses of Imodium to control this which works well.  No watery stools.  Associated mild abdominal discomfort prior to bowel movements that improves thereafter.  Denies BRBPR or  melena.  On days that she is at home are only traveling a short distance such as eating, she does not require Imodium and doesn't have diarrhea. In fact states she may skip a day between bowel movements.   Overall, feels her symptoms are actually somewhat better than they used to be in regards to intermittent diarrhea.  Watches her diet. Limits fried foods and salads. Eats cheese daily.   Only taking 2 Colazal in the morning and 2 in the evening. Forgot she was supposed to take 3 capsules 3 times daily. Has been taking it this way for at least 6 months.   No nausea, vomiting, reflux symptoms, dysphagia, weight loss. Energy is good.   No antibiotics.  No well water.  No sick contacts.  No NSAIDs.   She is taking 125 mcg of vitamin D per day and women's over 50 multivitamin daily.   Past Medical History:  Diagnosis Date   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 Sonny Dandy, no longer seeing anyone (02/2015)   Cataract    Complication of anesthesia    difficulty breathing after waking up from anesthesia   Crohn's disease ([place]) 2016   diagnosed in 2016 and started on Colazal, but inflammation/ulceration in ileum/cecum dates back to 2006.   Diabetes mellitus without complication ([place])    Diabetic neuropathy ([place])    Hyperlipidemia    Hypertension    IBS (irritable bowel syndrome)    Stroke ([place]) 08/12/2009   Syncope and collapse  Thyroid nodule    Ulcer     Past Surgical History:  Procedure Laterality Date   ABDOMINAL HYSTERECTOMY     partial   BACK SURGERY     x3 (Dr Loyola Mast, Dr Patrice Paradise)   BREAST SURGERY Right 2002   cyst removed   COLONOSCOPY  2006   RMR: Normal colon except for ileocecal valve, eroded/ulcerated areas at ileocecal valve with mucosal hemorrhage present status post biopsy. ulcer (ileocecal valve, biopsy): Chronic active inflammation with ulceration, nonspecific.   COLONOSCOPY  09/06/2009   RMR: Suboptimal prep on the right side. Ulcers noted at IC  valve, base of cecum, and scattered through food part of ascending colon. Pathology wiht chronic active colitis, question Crohn's   COLONOSCOPY N/A 03/27/2015   Dr. Gala Romney: abnormal cecum and IC valve most consistent with IBD. TI intubated and appeared normal. Likely Crohn's disease. Patient denies NSAID use. No improvement with Lialda samples at time of initial diagnosis   COLONOSCOPY N/A 05/15/2017   Dr. Gala Romney: ab normal IC valve, s/p biopsy with active ileitis, diverticulosis in sigmoid and descending colon. Non-bleeding internal hemorrhoids   ESOPHAGOGASTRODUODENOSCOPY     HAND SURGERY Right    Carpel tunnel and right elbow   LAPAROSCOPIC APPENDECTOMY N/A 07/21/2016   Procedure: APPENDECTOMY LAPAROSCOPIC;  Surgeon: Aviva Signs, MD;  Location: [facility];  Service: General;  Laterality: N/A;   Small bowel capsule endoscopy  06/10/2005   RMR: single erosion versus AVM, distal ileum   Small bowel capsule endoscopy  prior to 05/2005   [place]: reported erosions/ulcerations per medical record. actual report unavailable.    Current Outpatient Medications  Medication Sig Dispense Refill   atorvastatin (LIPITOR) 40 MG tablet Take 1 tablet (40 mg total) by mouth daily. 90 tablet 3   balsalazide (COLAZAL) 750 MG capsule TAKE 3 CAPSULES BY MOUTH THREE TIMES DAILY 810 capsule 1   cholecalciferol (VITAMIN D3) 25 MCG (1000 UNIT) tablet Take 1,000 Units by mouth daily.     Empagliflozin-linaGLIPtin (GLYXAMBI) 25-5 MG TABS Take 1 tablet by mouth daily. 90 tablet 3   Ferrous Gluconate (IRON 27 PO) Take by mouth daily.     glucose blood (ONETOUCH VERIO) test strip USE 1 STRIP TO CHECK GLUCOSE TWICE DAILY 200 each 2   HYDROcodone-acetaminophen (NORCO/VICODIN) 5-325 MG tablet Take by mouth.     insulin degludec (TRESIBA FLEXTOUCH) 100 UNIT/ML FlexTouch Pen Inject 40 Units into the skin daily. 30 mL 3   Insulin Pen Needle (PEN NEEDLES) 31G X 5 MM MISC Use to give insulin with flexpen 100 each 1   Insulin  Syringe-Needle U-100 31G X 1/4" 1 ML MISC 1 Syringe by Does not apply route 2 (two) times daily. 60 each 6   loperamide (IMODIUM) 1 MG/5ML solution Take 1 mg by mouth as needed for diarrhea or loose stools.     losartan (COZAAR) 25 MG tablet Take 1 tablet (25 mg total) by mouth daily. 90 tablet 3   Multiple Vitamins-Minerals (WOMENS 50+ MULTI VITAMIN/MIN PO) Take by mouth.     nystatin (MYCOSTATIN/NYSTOP) powder Apply 1 application topically 3 (three) times daily. 60 g 3   vitamin C (ASCORBIC ACID) 250 MG tablet Take 250 mg by mouth daily.     No current facility-administered medications for this visit.    Allergies as of 06/07/2021 - Review Complete 06/07/2021  Allergen Reaction Noted   Metformin and related  05/11/2014   Asa [aspirin]  11/04/2012   Nsaids  11/04/2012   Other Diarrhea 12/25/2014  Tolmetin Other (See Comments) 11/04/2012    Family History  Problem Relation Age of Onset   Hypertension Mother    Stroke Mother    Diabetes Father        w/ BKA   Hypertension Father    Asthma Father    Cancer Sister    Diabetes Sister        stomach   Breast cancer Sister    Diabetes Brother    Diabetes Sister    Cancer Sister        breast   Hypertension Sister    Hypertension Daughter    Hypertension Son    Multiple sclerosis Son    Colon cancer Neg Hx    Inflammatory bowel disease Neg Hx     Social History   Socioeconomic History   Marital status: Married    Spouse name: Herbie Baltimore   Number of children: 5   Years of education: 9th grade-then got her GED at [employer]   Highest education level: GED or equivalent  Occupational History   Occupation: retired  Tobacco Use   Smoking status: Never   Smokeless tobacco: Never  Vaping Use   Vaping Use: Never used  Substance and Sexual Activity   Alcohol use: No   Drug use: No   Sexual activity: Not Currently  Other Topics Concern   Not on file  Social History Narrative   Lives home with her husband - he is on dialysis - she  helps care for him   Their children live nearby   She babysits her 25 year old grandchild   Social Determinants of Radio broadcast assistant Strain: Low Risk    Difficulty of Paying Living Expenses: Not hard at all  Food Insecurity: No Food Insecurity   Worried About Charity fundraiser in the Last Year: Never true   Arboriculturist in the Last Year: Never true  Transportation Needs: No Transportation Needs   Lack of Transportation (Medical): No   Lack of Transportation (Non-Medical): No  Physical Activity: Insufficiently Active   Days of Exercise per Week: 7 days   Minutes of Exercise per Session: 20 min  Stress: No Stress Concern Present   Feeling of Stress : Not at all  Social Connections: Socially Integrated   Frequency of Communication with Friends and Family: More than three times a week   Frequency of Social Gatherings with Friends and Family: More than three times a week   Attends Religious Services: More than 4 times per year   Active Member of [employer] or Organizations: Yes   Attends Music therapist: More than 4 times per year   Marital Status: Married    Review of Systems: Gen: Denies fever, chills, cold or flulike symptoms, presyncope, syncope. CV: Denies chest pain, palpitations. Resp: Admits to occasional dyspnea with exertion, no dyspnea at rest or cough. GI: See HPI Derm: Denies rash. Heme: See HPI  Physical Exam: BP 138/82   Pulse 87   Temp (!) 96.8 F (36 C)   Ht 5' 2.5" (1.588 m)   Wt 172 lb 9.6 oz (78.3 kg)   BMI 31.07 kg/m  General:   Alert and oriented. No distress noted. Pleasant and cooperative.  Head:  Normocephalic and atraumatic. Eyes:  Conjuctiva clear without scleral icterus. Heart:  S1, S2 present without murmurs appreciated. Lungs:  Clear to auscultation bilaterally. No wheezes, rales, or rhonchi. No distress.  Abdomen:  +BS, soft, non-tender and non-distended. No rebound  or guarding. No HSM or masses noted. Msk:   Symmetrical without gross deformities. Normal posture. Extremities:  Without edema. Neurologic:  Alert and  oriented x4 Psych:  Normal mood and affect.    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  However, her history is somewhat complicated as she has had findings of ileal/cecal inflammation and/or ulceration dating back to 2006, and noted ulceration in ascending colon in 2011 (nonspecific at that time and queried NSAID effect).  Last colonoscopy in 2018 with active ileitis, otherwise normal exam.  Clinically, patient is doing fairly well with symptoms at baseline.  Continues with intermittent loose stools if traveling long distance in the setting of anxiety with associated mild abdominal discomfort that resolves with bowel movements, otherwise no significant diarrhea.  Symptoms likely influenced by IBS overlay and are well managed with Imodium as needed.  Denies BRBPR, melena, unintentional weight loss, fatigue, or any other significant upper or lower GI symptoms.  Notably, she is only taking 2 Colazal BID rather than 3 capsules TID which may also be influencing her intermittent diarrhea. Denies NSAIDs. No tobacco use. Recent CBC and CMP wnl.  I have recommended resuming Colazide 3 capsules 3 times daily and continuing Imodium as needed.  I will discuss with Dr. Gala Romney when her next colonoscopy would be due.    Osteopenia: Noted on DEXA scan in 2018.  Recommended vitamin D, calcium, and repeat DEXA in 2 years which has not been completed.  She is taking vitamin D and daily multivitamin.  We will update DEXA scan at this time.   Plan:  DEXA scan. Resume taking Colazal 3 capsules 3 times daily. Continue Imodium as needed. Avoid known dietary triggers of loose stools  including fried/fatty foods, salads. May also trial lactose-free diet or Lactaid tablets prior to dairy consumption. Continue to avoid NSAIDs.  Will discuss with Dr. Gala Romney when her next colonoscopy is due.  Follow-up in 6 months or sooner if needed.    Aliene Altes, PA-C [facility] 06/07/2021

## 2021-06-07 ENCOUNTER — Other Ambulatory Visit: Payer: Self-pay

## 2021-06-07 ENCOUNTER — Encounter: Payer: Self-pay | Admitting: Gastroenterology

## 2021-06-07 ENCOUNTER — Ambulatory Visit (INDEPENDENT_AMBULATORY_CARE_PROVIDER_SITE_OTHER): Payer: Medicare Other | Admitting: Gastroenterology

## 2021-06-07 VITALS — BP 138/82 | HR 87 | Temp 96.8°F | Ht 62.5 in | Wt 172.6 lb

## 2021-06-07 DIAGNOSIS — K5080 Crohn's disease of both small and large intestine without complications: Secondary | ICD-10-CM

## 2021-06-07 DIAGNOSIS — R195 Other fecal abnormalities: Secondary | ICD-10-CM | POA: Insufficient documentation

## 2021-06-07 DIAGNOSIS — M8580 Other specified disorders of bone density and structure, unspecified site: Secondary | ICD-10-CM | POA: Diagnosis not present

## 2021-06-07 NOTE — Patient Instructions (Addendum)
We will arrange for you to have a bone density scan at [facility].  Please increase Colazal to 3 capsules 3 times daily, total of 9 capsules/day.  May continue Imodium as needed.  Continue to limit fried/fatty foods and roughage such as salad/cabbage as this seems to increase loose stools.  You may also try following a lactose free diet or taking lactaid tablets prior to dairy consumption.   Avoid all NSAID products including ibuprofen, Aleve, Advil, Goody powders, BC powders, and anything that says "NSAID" on the package.  I will discuss with Dr. Gala Romney when your next colonoscopy should be.  We will plan to follow-up with you in 6 months.  Do not hesitate to call if you have questions or concerns prior to your next visit.  Aliene Altes, PA-C [facility]

## 2021-06-10 ENCOUNTER — Telehealth: Payer: Self-pay | Admitting: Gastroenterology

## 2021-06-10 NOTE — Telephone Encounter (Signed)
Felicia Pugh, please let patient know I discussed timing of next colonoscopy with Dr. Gala Romney. Recommended 5 year repeat following last colonoscopy. This means she will be due for a colonoscopy in October 2023.

## 2021-06-11 NOTE — Telephone Encounter (Signed)
Stacey: Please place on recall for colonoscopy October 2023.

## 2021-06-11 NOTE — Telephone Encounter (Signed)
Spoke to pt. Informed her or recommendations. Pt voiced understanding.

## 2021-06-14 ENCOUNTER — Ambulatory Visit (HOSPITAL_COMMUNITY)
Admission: RE | Admit: 2021-06-14 | Discharge: 2021-06-14 | Disposition: A | Discharge status: Home / Self Care | Payer: Medicare Other | Source: Ambulatory Visit | Attending: Gastroenterology | Admitting: Gastroenterology

## 2021-06-14 ENCOUNTER — Ambulatory Visit (HOSPITAL_COMMUNITY)
Admission: RE | Admit: 2021-06-14 | Discharge: 2021-06-14 | Disposition: A | Discharge status: Home / Self Care | Payer: Medicare Other | Source: Ambulatory Visit | Attending: Orthopaedic Surgery | Admitting: Orthopaedic Surgery

## 2021-06-14 ENCOUNTER — Other Ambulatory Visit: Payer: Self-pay

## 2021-06-14 DIAGNOSIS — Z794 Long term (current) use of insulin: Secondary | ICD-10-CM | POA: Diagnosis not present

## 2021-06-14 DIAGNOSIS — M5126 Other intervertebral disc displacement, lumbar region: Secondary | ICD-10-CM | POA: Diagnosis not present

## 2021-06-14 DIAGNOSIS — M4326 Fusion of spine, lumbar region: Secondary | ICD-10-CM | POA: Insufficient documentation

## 2021-06-14 DIAGNOSIS — M8580 Other specified disorders of bone density and structure, unspecified site: Secondary | ICD-10-CM | POA: Insufficient documentation

## 2021-06-14 DIAGNOSIS — M4316 Spondylolisthesis, lumbar region: Secondary | ICD-10-CM | POA: Insufficient documentation

## 2021-06-14 DIAGNOSIS — K5080 Crohn's disease of both small and large intestine without complications: Secondary | ICD-10-CM | POA: Insufficient documentation

## 2021-06-14 DIAGNOSIS — Z780 Asymptomatic menopausal state: Secondary | ICD-10-CM | POA: Insufficient documentation

## 2021-06-21 DIAGNOSIS — M4316 Spondylolisthesis, lumbar region: Secondary | ICD-10-CM | POA: Diagnosis not present

## 2021-06-21 DIAGNOSIS — I10 Essential (primary) hypertension: Secondary | ICD-10-CM | POA: Diagnosis not present

## 2021-06-21 DIAGNOSIS — M48062 Spinal stenosis, lumbar region with neurogenic claudication: Secondary | ICD-10-CM | POA: Diagnosis not present

## 2021-06-21 DIAGNOSIS — M4326 Fusion of spine, lumbar region: Secondary | ICD-10-CM | POA: Diagnosis not present

## 2021-07-02 ENCOUNTER — Encounter: Payer: Medicare Other | Attending: Family Medicine | Admitting: Nutrition

## 2021-07-02 ENCOUNTER — Encounter: Payer: Self-pay | Admitting: Nutrition

## 2021-07-02 ENCOUNTER — Other Ambulatory Visit: Payer: Self-pay

## 2021-07-02 VITALS — Ht 62.5 in | Wt 172.4 lb

## 2021-07-02 DIAGNOSIS — E1159 Type 2 diabetes mellitus with other circulatory complications: Secondary | ICD-10-CM | POA: Diagnosis not present

## 2021-07-02 DIAGNOSIS — E785 Hyperlipidemia, unspecified: Secondary | ICD-10-CM | POA: Insufficient documentation

## 2021-07-02 DIAGNOSIS — K5080 Crohn's disease of both small and large intestine without complications: Secondary | ICD-10-CM

## 2021-07-02 DIAGNOSIS — E669 Obesity, unspecified: Secondary | ICD-10-CM

## 2021-07-02 DIAGNOSIS — I152 Hypertension secondary to endocrine disorders: Secondary | ICD-10-CM

## 2021-07-02 DIAGNOSIS — E1169 Type 2 diabetes mellitus with other specified complication: Secondary | ICD-10-CM

## 2021-07-02 NOTE — Progress Notes (Signed)
Medical Nutrition Therapy  Appointment Start time:  1300  Appointment End time:  1400  Primary concerns today: DM Type 2 . Obesity  Referral diagnosis: E11.8, E66.9 Preferred learning style: visual, hands on , auditory  Learning readiness: Ready    NUTRITION ASSESSMENT   FBS 80's. Testing once a day. A1C 7.5% in 9/22. Diet is high in processed, high fat, high salt foods that is contributing to her diabetes, HTN, Hyperlipidemia and weight. Drinks diet sodas often.  Anthropometrics    Ht Readings from Last 3 Encounters:  07/02/21 5' 2.5" (1.588 m)  06/07/21 5' 2.5" (1.588 m)  05/30/21 5' 3"  (1.6 m)   Body mass index is 31.03 kg/m. @BMIFA @ Facility age limit for growth percentiles is 20 years. Facility age limit for growth percentiles is 20 years.  Clinical Medical Hx: DM Type 2, Hyperlipidemia, HTN Medications: Tresiba 40 units, Glyxambi Labs:  Lab Results  Component Value Date   HGBA1C 7.5 (H) 05/04/2021   CMP Latest Ref Rng & Units 05/04/2021 10/30/2020 07/04/2020  Glucose 65 - 99 mg/dL 96 76 83  BUN 8 - 27 mg/dL 14 17 23   Creatinine 0.57 - 1.00 mg/dL 0.73 0.69 0.77  Sodium 134 - 144 mmol/L 145(H) 146(H) 143  Potassium 3.5 - 5.2 mmol/L 4.2 4.3 4.5  Chloride 96 - 106 mmol/L 106 108(H) 105  CO2 20 - 29 mmol/L 25 24 32  Calcium 8.7 - 10.3 mg/dL 9.5 9.2 9.6  Total Protein 6.0 - 8.5 g/dL 6.9 6.7 7.1  Total Bilirubin 0.0 - 1.2 mg/dL 0.4 0.3 0.4  Alkaline Phos 44 - 121 IU/L 121 105 -  AST 0 - 40 IU/L 19 16 19   ALT 0 - 32 IU/L 12 12 12    Lipid Panel     Component Value Date/Time   CHOL 178 05/04/2021 0852   CHOL 181 02/09/2013 1001   TRIG 51 05/04/2021 0852   TRIG 42 05/27/2014 0950   TRIG 58 02/09/2013 1001   HDL 93 05/04/2021 0852   HDL 69 05/27/2014 0950   HDL 62 02/09/2013 1001   CHOLHDL 1.9 05/04/2021 0852   LDLCALC 75 05/04/2021 0852   LDLCALC 67 05/27/2014 0950   LDLCALC 107 (H) 02/09/2013 1001   LABVLDL 10 05/04/2021 0852     Notable  Signs/Symptoms: None  Lifestyle & Dietary Hx Lives with husband. She cooks. Her husband gets in dialysis Eats 2-3 meals per day  Estimated daily fluid intake: 8 oz Supplements: Vit C and MVI,  Sleep: 9 hous Stress / self-care: none Current average weekly physical activity: ADL  24-Hr Dietary Recall First Meal: Egg sandwich, bacon or sausage, water Snack:  Second Meal: 1 slice bologna and 1 slice bread with maynoise, Diet Mt Dew Snack:  Third Meal: Chicken, potato salad, green beans, corn bread and diet mt dew  Snack: potato chips, lemonade Beverages: water, lemonade diet soads  Estimated Energy Needs Calories: 1200 Carbohydrate: 135g Protein: 90g Fat: 33g   NUTRITION DIAGNOSIS  NB-1.1 Food and nutrition-related knowledge deficit As related to Diabetes Type 2.  As evidenced by A1C 7.5%.   NUTRITION INTERVENTION  Nutrition education (E-1) on the following topics:  Nutrition and Diabetes education provided on My Plate, CHO counting, meal planning, portion sizes, timing of meals, avoiding snacks between meals unless having a low blood sugar, target ranges for A1C and blood sugars, signs/symptoms and treatment of hyper/hypoglycemia, monitoring blood sugars, taking medications as prescribed, benefits of exercising 30 minutes per day and prevention of complications of  DM.   Handouts Provided Include  My Plate Meal Plan Card Diabetes Instructions.   Learning Style & Readiness for Change Teaching method utilized: Visual & Auditory  Demonstrated degree of understanding via: Teach Back  Barriers to learning/adherence to lifestyle change: none  Goals Established by Pt Goals  Follow MY Plate Eat 2 carb choices per meals Eat meals on time Cut out sodas Drink 2-3 bottles of water per day Walk 10-15 minutes a day Get A1C down  to 7%   MONITORING & EVALUATION Dietary intake, weekly physical activity, and blood sugars  in 1 month.  Next Steps  Patient is to work on meal  planning and eating meals on time.[name]

## 2021-07-02 NOTE — Patient Instructions (Signed)
Goals  Follow MY Plate Eat 2 carb choices per meals Eat meals on time Cut out sodas Drink 2-3 bottles of water per day Walk 10-15 minutes a day Get A1C down  to 7%

## 2021-07-09 ENCOUNTER — Other Ambulatory Visit: Payer: Self-pay | Admitting: Family Medicine

## 2021-07-09 DIAGNOSIS — Z1231 Encounter for screening mammogram for malignant neoplasm of breast: Secondary | ICD-10-CM

## 2021-07-12 DIAGNOSIS — M4316 Spondylolisthesis, lumbar region: Secondary | ICD-10-CM | POA: Diagnosis not present

## 2021-07-12 DIAGNOSIS — M4326 Fusion of spine, lumbar region: Secondary | ICD-10-CM | POA: Diagnosis not present

## 2021-07-12 DIAGNOSIS — M48062 Spinal stenosis, lumbar region with neurogenic claudication: Secondary | ICD-10-CM | POA: Diagnosis not present

## 2021-07-24 ENCOUNTER — Encounter: Payer: Self-pay | Admitting: Nutrition

## 2021-07-24 DIAGNOSIS — M48062 Spinal stenosis, lumbar region with neurogenic claudication: Secondary | ICD-10-CM | POA: Diagnosis not present

## 2021-07-27 ENCOUNTER — Other Ambulatory Visit: Payer: Self-pay | Admitting: Family Medicine

## 2021-07-30 ENCOUNTER — Other Ambulatory Visit: Payer: Self-pay | Admitting: Family Medicine

## 2021-08-03 ENCOUNTER — Ambulatory Visit (INDEPENDENT_AMBULATORY_CARE_PROVIDER_SITE_OTHER): Payer: Medicare Other | Admitting: Family Medicine

## 2021-08-03 ENCOUNTER — Other Ambulatory Visit: Payer: Self-pay | Admitting: Family Medicine

## 2021-08-03 ENCOUNTER — Encounter: Payer: Self-pay | Admitting: Family Medicine

## 2021-08-03 VITALS — BP 125/68 | HR 89 | Ht 62.5 in | Wt 170.0 lb

## 2021-08-03 DIAGNOSIS — Z794 Long term (current) use of insulin: Secondary | ICD-10-CM | POA: Diagnosis not present

## 2021-08-03 DIAGNOSIS — E785 Hyperlipidemia, unspecified: Secondary | ICD-10-CM | POA: Diagnosis not present

## 2021-08-03 DIAGNOSIS — E1169 Type 2 diabetes mellitus with other specified complication: Secondary | ICD-10-CM

## 2021-08-03 DIAGNOSIS — I152 Hypertension secondary to endocrine disorders: Secondary | ICD-10-CM

## 2021-08-03 DIAGNOSIS — E1159 Type 2 diabetes mellitus with other circulatory complications: Secondary | ICD-10-CM | POA: Diagnosis not present

## 2021-08-03 LAB — BAYER DCA HB A1C WAIVED: HB A1C (BAYER DCA - WAIVED): 7.5 % — ABNORMAL HIGH (ref 4.8–5.6)

## 2021-08-03 MED ORDER — LOSARTAN POTASSIUM 25 MG PO TABS
25.0000 mg | ORAL_TABLET | Freq: Every day | ORAL | 3 refills | Status: DC
Start: 2021-08-03 — End: 2022-10-08

## 2021-08-03 MED ORDER — ATORVASTATIN CALCIUM 40 MG PO TABS: 40.0000 mg | ORAL_TABLET | Freq: Every day | ORAL | 3 refills | Status: DC

## 2021-08-03 MED ORDER — GLYXAMBI 25-5 MG PO TABS: 1.0000 | ORAL_TABLET | Freq: Every day | ORAL | 3 refills | Status: DC

## 2021-08-03 NOTE — Progress Notes (Signed)
BP 125/68    Pulse 89    Ht 5' 2.5" (1.588 m)    Wt 170 lb (77.1 kg)    SpO2 95%    BMI 30.60 kg/m    Subjective:   Patient ID: Felicia Pugh, female    DOB: 02-Jul-1946, 75 y.o.   MRN: [id number]  HPI: Felicia Pugh is a 75 y.o. female presenting on 08/03/2021 for URI, Medical Management of Chronic Issues, Diabetes, Hyperlipidemia, and Hypertension   HPI Type 2 diabetes mellitus Patient comes in today for recheck of his diabetes. Patient has been currently taking [place] 40 and Glyxambi. Patient is currently on an ACE inhibitor/ARB. Patient has seen an ophthalmologist this year. Patient denies any issues with their feet. The symptom started onset as an adult hypertension and hyperlipidemia ARE RELATED TO DM   Hypertension Patient is currently on losartan, and their blood pressure today is 125/60. Patient denies any lightheadedness or dizziness. Patient denies headaches, blurred vision, chest pains, shortness of breath, or weakness. Denies any side effects from medication and is content with current medication.   Hyperlipidemia Patient is coming in for recheck of his hyperlipidemia. The patient is currently taking atorvastatin. They deny any issues with myalgias or history of liver damage from it. They deny any focal numbness or weakness or chest pain.   Relevant past medical, surgical, family and social history reviewed and updated as indicated. Interim medical history since our last visit reviewed. Allergies and medications reviewed and updated.  Review of Systems  Constitutional:  Negative for chills and fever.  Eyes:  Negative for visual disturbance.  Respiratory:  Negative for chest tightness and shortness of breath.   Cardiovascular:  Negative for chest pain and leg swelling.  Musculoskeletal:  Negative for back pain and gait problem.  Skin:  Negative for rash.  Neurological:  Negative for light-headedness and headaches.  Psychiatric/Behavioral:  Negative for agitation and  behavioral problems.   All other systems reviewed and are negative.  Per HPI unless specifically indicated above   Allergies as of 08/03/2021       Reactions   Metformin And Related    Severe diarrhea   Asa [aspirin]    GI Dr does not want her to take due to hx ulcers   Nsaids    GI Dr does not want her to take due to hx of ulcers   Other Diarrhea   Steroids   Tolmetin Other (See Comments)   GI Dr does not want her to take due to hx of ulcers GI Dr does not want her to take due to hx of ulcers        Medication List        Accurate as of August 03, 2021  9:00 AM. If you have any questions, ask your nurse or doctor.          STOP taking these medications    HYDROcodone-acetaminophen 5-325 MG tablet Commonly known as: NORCO/VICODIN Stopped by: Fransisca Kaufmann Tatisha Cerino, MD       TAKE these medications    atorvastatin 40 MG tablet Commonly known as: LIPITOR Take 1 tablet (40 mg total) by mouth daily.   balsalazide 750 MG capsule Commonly known as: COLAZAL TAKE 3 CAPSULES BY MOUTH THREE TIMES DAILY   cholecalciferol 25 MCG (1000 UNIT) tablet Commonly known as: VITAMIN D3 Take 1,000 Units by mouth daily.   Glyxambi 25-5 MG Tabs Generic drug: Empagliflozin-linaGLIPtin Take 1 tablet by mouth daily.   Insulin Syringe-Needle  U-100 31G X 1/4" 1 ML Misc 1 Syringe by Does not apply route 2 (two) times daily.   IRON 27 PO Take by mouth daily.   loperamide 1 MG/5ML solution Commonly known as: IMODIUM Take 1 mg by mouth as needed for diarrhea or loose stools.   losartan 25 MG tablet Commonly known as: COZAAR Take 1 tablet (25 mg total) by mouth daily.   nystatin powder Commonly known as: MYCOSTATIN/NYSTOP Apply 1 application topically 3 (three) times daily.   OneTouch Verio test strip Generic drug: glucose blood USE 1 STRIP TO CHECK GLUCOSE TWICE DAILY   Pen Needles 31G X 5 MM Misc Use to give insulin with flexpen   Tresiba FlexTouch 100 UNIT/ML  FlexTouch Pen Generic drug: insulin degludec Inject 40 Units into the skin daily.   vitamin C 250 MG tablet Commonly known as: ASCORBIC ACID Take 250 mg by mouth daily.   WOMENS 50+ MULTI VITAMIN/MIN PO Take by mouth.         Objective:   BP 125/68    Pulse 89    Ht 5' 2.5" (1.588 m)    Wt 170 lb (77.1 kg)    SpO2 95%    BMI 30.60 kg/m   Wt Readings from Last 3 Encounters:  08/03/21 170 lb (77.1 kg)  07/02/21 172 lb 6.4 oz (78.2 kg)  06/07/21 172 lb 9.6 oz (78.3 kg)    Physical Exam Vitals and nursing note reviewed.  Constitutional:      General: She is not in acute distress.    Appearance: She is well-developed. She is not diaphoretic.  Eyes:     Conjunctiva/sclera: Conjunctivae normal.  Cardiovascular:     Rate and Rhythm: Normal rate and regular rhythm.     Heart sounds: Normal heart sounds. No murmur heard. Pulmonary:     Effort: Pulmonary effort is normal. No respiratory distress.     Breath sounds: Normal breath sounds. No wheezing.  Musculoskeletal:        General: No swelling or tenderness. Normal range of motion.  Skin:    General: Skin is warm and dry.     Findings: No rash.  Neurological:     Mental Status: She is alert and oriented to person, place, and time.     Coordination: Coordination normal.  Psychiatric:        Behavior: Behavior normal.      Assessment & Plan:   Problem List Items Addressed This Visit       Cardiovascular and Mediastinum   Hypertension associated with diabetes ([place])   Relevant Medications   atorvastatin (LIPITOR) 40 MG tablet   Empagliflozin-linaGLIPtin (GLYXAMBI) 25-5 MG TABS   losartan (COZAAR) 25 MG tablet     Endocrine   Hyperlipidemia associated with type 2 diabetes mellitus ([facility])   Relevant Medications   atorvastatin (LIPITOR) 40 MG tablet   Empagliflozin-linaGLIPtin (GLYXAMBI) 25-5 MG TABS   losartan (COZAAR) 25 MG tablet   Type 2 diabetes mellitus with other specified complication ([facility]) - Primary    Relevant Medications   atorvastatin (LIPITOR) 40 MG tablet   Empagliflozin-linaGLIPtin (GLYXAMBI) 25-5 MG TABS   losartan (COZAAR) 25 MG tablet   Other Relevant Orders   Bayer DCA Hb A1c Waived    A1c is 7.5, same as last time.  She has gone a couple of steroid injections since we saw her left. Recommended to increase Tresiba to 44 units for 2 or 3 days after every time she has an injection and focus  on smaller more frequent meals to help with her Crohn's and reduce carbohydrates at 1 time. Follow up plan: Return in about 3 months (around 11/01/2021), or if symptoms worsen or fail to improve, for Diabetes and hypertension cholesterol recheck.  Counseling provided for all of the vaccine components Orders Placed This Encounter  Procedures   Bayer [place] Hb A1c Pioneer Malone Admire, MD [facility] 08/03/2021, 9:00 AM

## 2021-08-10 ENCOUNTER — Ambulatory Visit
Admission: RE | Admit: 2021-08-10 | Discharge: 2021-08-10 | Disposition: A | Discharge status: Home / Self Care | Source: Ambulatory Visit | Attending: Family Medicine | Admitting: Family Medicine

## 2021-08-10 DIAGNOSIS — Z1231 Encounter for screening mammogram for malignant neoplasm of breast: Secondary | ICD-10-CM

## 2021-08-28 ENCOUNTER — Ambulatory Visit: Admitting: Nutrition

## 2021-09-27 ENCOUNTER — Other Ambulatory Visit: Payer: Self-pay

## 2021-09-27 ENCOUNTER — Encounter: Payer: Medicare PPO | Attending: Family Medicine | Admitting: Nutrition

## 2021-09-27 ENCOUNTER — Encounter: Payer: Self-pay | Admitting: Nutrition

## 2021-09-27 DIAGNOSIS — E1169 Type 2 diabetes mellitus with other specified complication: Secondary | ICD-10-CM | POA: Diagnosis present

## 2021-09-27 DIAGNOSIS — Z794 Long term (current) use of insulin: Secondary | ICD-10-CM | POA: Insufficient documentation

## 2021-09-27 NOTE — Progress Notes (Signed)
Medical Nutrition Therapy Follow up Appointment Start time:   1130 Appointment End time:  1215  Primary concerns today: DM Type 2 . Obesity  Referral diagnosis: E11.8, E66.9 Preferred learning style: visual, hands on , auditory  Learning readiness: Ready    NUTRITION ASSESSMENT   PCP: Dr. Warrick Parisian, A1C 7.5%. Has made changed but was disappointment her A1C didn't come down any. Possibly related to having taking steriods in her back.  Had some gel in her hips also. 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 Changes: Has cut back on sweets.Cutting out junk food. History of Crohns and IBS, so is limited in foods she will eat. Doesn't eat a lot vegetables and  no fruit. Usually skips lunch and just eats a little breakfast and larger supper. Snacks or grazes during the day. Admits to putting sugar and splenda in coffee in am. Has diet sodas at times. FBS 80-100's Gained 2lbs. She notes she has gained some weight over holidays and willing to get back on track with better eating. She notes if her BS is low when she takes her insulin in am, then she reduces her Tresiba to 30  units sometimes.  Diet is insuffient to meet her needs nutritionally for desired weight loss.  Needs more plant based foods with consistent CHO intake and not to skip meals.  Anthropometrics   Wt Readings from Last 3 Encounters:  09/27/21 172 lb 3.2 oz (78.1 kg)  08/03/21 170 lb (77.1 kg)  07/02/21 172 lb 6.4 oz (78.2 kg)   Ht Readings from Last 3 Encounters:  09/27/21 5' 2.5" (1.588 m)  08/03/21 5' 2.5" (1.588 m)  07/02/21 5' 2.5" (1.588 m)   Body mass index is 30.99 kg/m. _0 @ Facility age limit for growth percentiles is 20 years. Facility age limit for growth percentiles is 20 years. @ Facility age limit for growth percentiles is 20 years. Facility age limit for growth percentiles is 20 years.  Clinical Medical Hx: DM Type 2, Hyperlipidemia, HTN Medications: Tresiba 40 units,  Glyxambi Labs:  Lab Results  Component Value Date   HGBA1C 7.5 (H) 08/03/2021   CMP Latest Ref Rng & Units 05/04/2021 10/30/2020 07/04/2020  Glucose 65 - 99 mg/dL 96 76 83  BUN 8 - 27 mg/dL _1 Creatinine 0.57 - 1.00 mg/dL 0.73 0.69 0.77  Sodium 134 - 144 mmol/L 145(H) 146(H) 143  Potassium 3.5 - 5.2 mmol/L 4.2 4.3 4.5  Chloride 96 - 106 mmol/L 106 108(H) 105  CO2 20 - 29 mmol/L 25 24 32  Calcium 8.7 - 10.3 mg/dL 9.5 9.2 9.6  Total Protein 6.0 - 8.5 g/dL 6.9 6.7 7.1  Total Bilirubin 0.0 - 1.2 mg/dL 0.4 0.3 0.4  Alkaline Phos 44 - 121 IU/L 121 105 -  AST 0 - 40 IU/L _2 ALT 0 - 32 IU/L _3 Lipid Panel     Component Value Date/Time   CHOL 178 05/04/2021 0852   CHOL 181 02/09/2013 1001   TRIG 51 05/04/2021 0852   TRIG 42 05/27/2014 0950   TRIG 58 02/09/2013 1001   HDL 93 05/04/2021 0852   HDL 69 05/27/2014 0950   HDL 62 02/09/2013 1001   CHOLHDL 1.9 05/04/2021 0852   LDLCALC 75 05/04/2021 0852   LDLCALC 67 05/27/2014 0950   LDLCALC 107 (H) 02/09/2013 1001   LABVLDL 10 05/04/2021 0852     Notable Signs/Symptoms: None  Lifestyle & Dietary  Hx Lives with husband. She cooks. Her husband gets in dialysis Eats 2-3 meals per day  Estimated daily fluid intake: 8 oz Supplements: Vit C and MVI,  Sleep: 9 hous Stress / self-care: none Current average weekly physical activity: ADL  24-Hr Dietary Recall First Meal:  Egg sandwich, water,  Snack:  Second Meal: baked chicken,  pork n beans,. Diet sods  Snack:  Third Meal:  Meatballs 2, mac/cheese, water, Beverages: water, lemonade diet soads  Estimated Energy Needs Calories: 1200 Carbohydrate: 135g Protein: 90g Fat: 33g   NUTRITION DIAGNOSIS  NB-1.1 Food and nutrition-related knowledge deficit As related to Diabetes Type 2.  As evidenced by A1C 7.5%.   NUTRITION INTERVENTION  Nutrition education (E-1) on the following topics:  Nutrition and Diabetes education provided on My Plate, CHO counting,  meal planning, portion sizes, timing of meals, avoiding snacks between meals unless having a low blood sugar, target ranges for A1C and blood sugars, signs/symptoms and treatment of hyper/hypoglycemia, monitoring blood sugars, taking medications as prescribed, benefits of exercising 30 minutes per day and prevention of complications of DM.   Handouts Provided Include  My Plate Meal Plan Card Diabetes Instructions.   Learning Style & Readiness for Change Teaching method utilized: Visual & Auditory  Demonstrated degree of understanding via: Teach Back  Barriers to learning/adherence to lifestyle change: none  Goals Established by Pt Goals  Cut out diet sodas and creamers Drink only water Increase vegetables and add some  berries Drink 5-6 bottles of water per day Get A1C 7% or less. Prevent low blood sugars Try some lentils for protein and fiber.   MONITORING & EVALUATION Dietary intake, weekly physical activity, and blood sugars  in 3 month.  Next Steps  Patient is to work on meal planning and eating meals on time.[name]

## 2021-09-27 NOTE — Patient Instructions (Addendum)
Goals  Cut out diet sodas and creamers Drink only water Increase vegetables and add some  berries Drink 5-6 bottles of water per day Get A1C 7% or less. Prevent low blood sugars Try some lentils for protein and fiber.

## 2021-10-03 ENCOUNTER — Other Ambulatory Visit: Payer: Self-pay | Admitting: Family Medicine

## 2021-10-03 NOTE — Telephone Encounter (Signed)
Last office visit 08/03/21 Last refill 07/28/20, 60 grams, 1 refill

## 2021-10-26 ENCOUNTER — Other Ambulatory Visit: Payer: Self-pay | Admitting: Rehabilitation

## 2021-10-26 ENCOUNTER — Other Ambulatory Visit (HOSPITAL_COMMUNITY): Payer: Self-pay | Admitting: Rehabilitation

## 2021-10-26 DIAGNOSIS — M1612 Unilateral primary osteoarthritis, left hip: Secondary | ICD-10-CM

## 2021-11-08 ENCOUNTER — Encounter: Payer: Self-pay | Admitting: Family Medicine

## 2021-11-08 ENCOUNTER — Ambulatory Visit (INDEPENDENT_AMBULATORY_CARE_PROVIDER_SITE_OTHER): Payer: Medicare PPO | Admitting: Family Medicine

## 2021-11-08 VITALS — BP 149/83 | HR 75 | Ht 62.5 in | Wt 172.0 lb

## 2021-11-08 DIAGNOSIS — Z23 Encounter for immunization: Secondary | ICD-10-CM

## 2021-11-08 DIAGNOSIS — Z794 Long term (current) use of insulin: Secondary | ICD-10-CM

## 2021-11-08 DIAGNOSIS — I152 Hypertension secondary to endocrine disorders: Secondary | ICD-10-CM

## 2021-11-08 DIAGNOSIS — E1169 Type 2 diabetes mellitus with other specified complication: Secondary | ICD-10-CM

## 2021-11-08 DIAGNOSIS — E1159 Type 2 diabetes mellitus with other circulatory complications: Secondary | ICD-10-CM

## 2021-11-08 DIAGNOSIS — E785 Hyperlipidemia, unspecified: Secondary | ICD-10-CM

## 2021-11-08 LAB — BAYER DCA HB A1C WAIVED: HB A1C (BAYER DCA - WAIVED): 6.6 % — ABNORMAL HIGH (ref 4.8–5.6)

## 2021-11-08 MED ORDER — ONETOUCH VERIO VI STRP: ORAL_STRIP | 2 refills | Status: DC

## 2021-11-08 MED ORDER — PEN NEEDLES 31G X 5 MM MISC: 1 refills | Status: DC

## 2021-11-08 MED ORDER — "INSULIN SYRINGE-NEEDLE U-100 31G X 1/4"" 1 ML MISC": 1.0000 | Freq: Two times a day (BID) | 6 refills | Status: DC

## 2021-11-08 MED ORDER — NYSTATIN 100000 UNIT/GM EX POWD: CUTANEOUS | 0 refills | Status: DC

## 2021-11-08 NOTE — Progress Notes (Signed)
? ?BP (!) 149/83   Pulse 75   Ht 5' 2.5" (1.588 m)   Wt 172 lb (78 kg)   SpO2 97%   BMI 30.96 kg/m?   ? ?Subjective:  ? ?Patient ID: Felicia Pugh, female    DOB: 07-24-1946, 76 y.o.   MRN: [id number] ? ?HPI: ?Felicia Pugh is a 76 y.o. female presenting on 11/08/2021 for Medical Management of Chronic Issues, Diabetes, and Fatigue ? ? ?HPI ?Type 2 diabetes mellitus ?Patient comes in today for recheck of his diabetes. Patient has been currently taking Glyxambi and [place]. Patient is currently on an ACE inhibitor/ARB. Patient has not seen an ophthalmologist this year. Patient denies any new issues with their feet. The symptom started onset as an adult hypertension and hyperlipidemia ARE RELATED TO DM  ? ?Hypertension ?Patient is currently on losartan, and their blood pressure today is 149/83. Patient denies any lightheadedness or dizziness. Patient denies headaches, blurred vision, chest pains, shortness of breath, or weakness. Denies any side effects from medication and is content with current medication.  ? ?Hyperlipidemia ?Patient is coming in for recheck of his hyperlipidemia. The patient is currently taking atorvastatin. They deny any issues with myalgias or history of liver damage from it. They deny any focal numbness or weakness or chest pain.  ? ?Sees GI for Crohn's and GERD ? ?Relevant past medical, surgical, family and social history reviewed and updated as indicated. Interim medical history since our last visit reviewed. ?Allergies and medications reviewed and updated. ? ?Review of Systems  ?Constitutional:  Negative for chills and fever.  ?HENT:  Negative for congestion, ear discharge and ear pain.   ?Eyes:  Negative for visual disturbance.  ?Respiratory:  Negative for chest tightness and shortness of breath.   ?Cardiovascular:  Negative for chest pain and leg swelling.  ?Genitourinary:  Negative for difficulty urinating and dysuria.  ?Musculoskeletal:  Negative for back pain and gait problem.   ?Skin:  Negative for rash.  ?Neurological:  Negative for dizziness, light-headedness and headaches.  ?Psychiatric/Behavioral:  Negative for agitation and behavioral problems.   ?All other systems reviewed and are negative. ? ?Per HPI unless specifically indicated above ? ? ?Allergies as of 11/08/2021   ? ?   Reactions  ? Metformin And Related   ? Severe diarrhea  ? Asa [aspirin]   ? GI Dr does not want her to take due to hx ulcers  ? Nsaids   ? GI Dr does not want her to take due to hx of ulcers  ? Other Diarrhea  ? Steroids  ? Tolmetin Other (See Comments)  ? GI Dr does not want her to take due to hx of ulcers ?GI Dr does not want her to take due to hx of ulcers  ? ?  ? ?  ?Medication List  ?  ? ?  ? Accurate as of November 08, 2021  9:00 AM. If you have any questions, ask your nurse or doctor.  ?  ?  ? ?  ? ?atorvastatin 40 MG tablet ?Commonly known as: LIPITOR ?Take 1 tablet (40 mg total) by mouth daily. ?  ?balsalazide 750 MG capsule ?Commonly known as: COLAZAL ?TAKE 3 CAPSULES BY MOUTH THREE TIMES DAILY ?  ?cholecalciferol 25 MCG (1000 UNIT) tablet ?Commonly known as: VITAMIN D3 ?Take 1,000 Units by mouth daily. ?  ?Glyxambi 25-5 MG Tabs ?Generic drug: Empagliflozin-linaGLIPtin ?Take 1 tablet by mouth daily. ?  ?Insulin Syringe-Needle U-100 31G X 1/4" 1 ML Misc ?1 Syringe  by Does not apply route 2 (two) times daily. ?  ?IRON 27 PO ?Take by mouth daily. ?  ?loperamide 1 MG/5ML solution ?Commonly known as: IMODIUM ?Take 1 mg by mouth as needed for diarrhea or loose stools. ?  ?losartan 25 MG tablet ?Commonly known as: COZAAR ?Take 1 tablet (25 mg total) by mouth daily. ?  ?nystatin powder ?Commonly known as: MYCOSTATIN/NYSTOP ?APPLY  POWDER TOPICALLY TO AFFECTED AREA THREE TIMES DAILY ?  ?OneTouch Verio test strip ?Generic drug: glucose blood ?USE 1 STRIP TO CHECK GLUCOSE TWICE DAILY ?  ?Pen Needles 31G X 5 MM Misc ?Use to give insulin with flexpen ?  ?Tyler Aas FlexTouch 100 UNIT/ML FlexTouch Pen ?Generic drug:  insulin degludec ?Inject 40 Units into the skin daily. ?  ?vitamin C 250 MG tablet ?Commonly known as: ASCORBIC ACID ?Take 250 mg by mouth daily. ?  ?WOMENS 50+ MULTI VITAMIN/MIN PO ?Take by mouth. ?  ? ?  ? ? ? ?Objective:  ? ?BP (!) 149/83   Pulse 75   Ht 5' 2.5" (1.588 m)   Wt 172 lb (78 kg)   SpO2 97%   BMI 30.96 kg/m?   ?Wt Readings from Last 3 Encounters:  ?11/08/21 172 lb (78 kg)  ?09/27/21 172 lb 3.2 oz (78.1 kg)  ?08/03/21 170 lb (77.1 kg)  ?  ?Physical Exam ?Vitals and nursing note reviewed.  ?Constitutional:   ?   General: She is not in acute distress. ?   Appearance: She is well-developed. She is not diaphoretic.  ?Eyes:  ?   Conjunctiva/sclera: Conjunctivae normal.  ?Cardiovascular:  ?   Rate and Rhythm: Normal rate and regular rhythm.  ?   Heart sounds: Normal heart sounds. No murmur heard. ?Pulmonary:  ?   Effort: Pulmonary effort is normal. No respiratory distress.  ?   Breath sounds: Normal breath sounds. No wheezing.  ?Musculoskeletal:     ?   General: No swelling or tenderness. Normal range of motion.  ?Skin: ?   General: Skin is warm and dry.  ?   Findings: No rash.  ?Neurological:  ?   Mental Status: She is alert and oriented to person, place, and time.  ?   Coordination: Coordination normal.  ?Psychiatric:     ?   Behavior: Behavior normal.  ? ? ? ? ?Assessment & Plan:  ? ?Problem List Items Addressed This Visit   ? ?  ? Cardiovascular and Mediastinum  ? Hypertension associated with diabetes ([place])  ? Relevant Orders  ? CBC with Differential/Platelet  ? CMP14+EGFR  ? Lipid panel  ? Bayer DCA Hb A1c Waived  ?  ? Endocrine  ? Hyperlipidemia associated with type 2 diabetes mellitus ([place])  ? Relevant Orders  ? CBC with Differential/Platelet  ? CMP14+EGFR  ? Lipid panel  ? Bayer DCA Hb A1c Waived  ? Type 2 diabetes mellitus with other specified complication ([place]) - Primary  ? Relevant Orders  ? CBC with Differential/Platelet  ? CMP14+EGFR  ? Lipid panel  ? Bayer DCA Hb A1c Waived  ? ?Other Visit  Diagnoses   ? ? Need for shingles vaccine      ? ?  ?  ?A1c looks good at 6.6.  Blood pressure is borderline.  No changes in medicine for now. ?She is seen somebody to have her hip looked on the left side and has an MRI scheduled. ?Follow up plan: ?Return in about 3 months (around 02/08/2022), or if symptoms worsen or fail to improve,  for Diabetes. ? ?Counseling provided for all of the vaccine components ?Orders Placed This Encounter  ?Procedures  ? CBC with Differential/Platelet  ? CMP14+EGFR  ? Lipid panel  ? Bayer DCA Hb A1c Waived  ? ? ?Caryl Pina, MD ?[place] ?11/08/2021, 9:00 AM ? ? ?  ?

## 2021-11-09 LAB — CBC WITH DIFFERENTIAL/PLATELET
Basophils Absolute: 0.1 10*3/uL (ref 0.0–0.2)
Basos: 1 %
EOS (ABSOLUTE): 0.1 10*3/uL (ref 0.0–0.4)
Eos: 1 %
Hematocrit: 38.3 % (ref 34.0–46.6)
Hemoglobin: 12.2 g/dL (ref 11.1–15.9)
Immature Grans (Abs): 0 10*3/uL (ref 0.0–0.1)
Immature Granulocytes: 0 %
Lymphocytes Absolute: 1.2 10*3/uL (ref 0.7–3.1)
Lymphs: 15 %
MCH: 28.1 pg (ref 26.6–33.0)
MCHC: 31.9 g/dL (ref 31.5–35.7)
MCV: 88 fL (ref 79–97)
Monocytes Absolute: 0.7 10*3/uL (ref 0.1–0.9)
Monocytes: 9 %
Neutrophils Absolute: 5.9 10*3/uL (ref 1.4–7.0)
Neutrophils: 74 %
Platelets: 281 10*3/uL (ref 150–450)
RBC: 4.34 x10E6/uL (ref 3.77–5.28)
RDW: 13 % (ref 11.7–15.4)
WBC: 8 10*3/uL (ref 3.4–10.8)

## 2021-11-09 LAB — LIPID PANEL
Chol/HDL Ratio: 1.8 ratio (ref 0.0–4.4)
Cholesterol, Total: 163 mg/dL (ref 100–199)
HDL: 89 mg/dL (ref >39)
LDL Chol Calc (NIH): 65 mg/dL (ref 0–99)
Triglycerides: 40 mg/dL (ref 0–149)
VLDL Cholesterol Cal: 9 mg/dL (ref 5–40)

## 2021-11-09 LAB — CMP14+EGFR
ALT: 12 IU/L (ref 0–32)
AST: 20 IU/L (ref 0–40)
Albumin/Globulin Ratio: 2.1 (ref 1.2–2.2)
Albumin: 4.4 g/dL (ref 3.7–4.7)
Alkaline Phosphatase: 106 IU/L (ref 44–121)
BUN/Creatinine Ratio: 20 (ref 12–28)
BUN: 14 mg/dL (ref 8–27)
Bilirubin Total: 0.3 mg/dL (ref 0.0–1.2)
CO2: 26 mmol/L (ref 20–29)
Calcium: 9.4 mg/dL (ref 8.7–10.3)
Chloride: 106 mmol/L (ref 96–106)
Creatinine, Ser: 0.69 mg/dL (ref 0.57–1.00)
Globulin, Total: 2.1 g/dL (ref 1.5–4.5)
Glucose: 62 mg/dL — ABNORMAL LOW (ref 70–99)
Potassium: 4.1 mmol/L (ref 3.5–5.2)
Sodium: 145 mmol/L — ABNORMAL HIGH (ref 134–144)
Total Protein: 6.5 g/dL (ref 6.0–8.5)
eGFR: 90 mL/min/{1.73_m2} (ref >59)

## 2021-11-13 ENCOUNTER — Ambulatory Visit (HOSPITAL_COMMUNITY)
Admission: RE | Admit: 2021-11-13 | Discharge: 2021-11-13 | Disposition: A | Discharge status: Home / Self Care | Payer: Medicare PPO | Source: Ambulatory Visit | Attending: Rehabilitation | Admitting: Rehabilitation

## 2021-11-13 DIAGNOSIS — M1612 Unilateral primary osteoarthritis, left hip: Secondary | ICD-10-CM | POA: Diagnosis present

## 2021-11-19 ENCOUNTER — Ambulatory Visit (INDEPENDENT_AMBULATORY_CARE_PROVIDER_SITE_OTHER): Payer: Medicare PPO | Admitting: Family Medicine

## 2021-11-19 ENCOUNTER — Encounter: Payer: Self-pay | Admitting: Family Medicine

## 2021-11-19 VITALS — BP 149/80 | HR 83 | Temp 97.0°F | Ht 62.5 in | Wt 172.8 lb

## 2021-11-19 DIAGNOSIS — I10 Essential (primary) hypertension: Secondary | ICD-10-CM | POA: Diagnosis not present

## 2021-11-19 DIAGNOSIS — E1169 Type 2 diabetes mellitus with other specified complication: Secondary | ICD-10-CM | POA: Diagnosis not present

## 2021-11-19 DIAGNOSIS — Z794 Long term (current) use of insulin: Secondary | ICD-10-CM | POA: Diagnosis not present

## 2021-11-19 DIAGNOSIS — J0140 Acute pansinusitis, unspecified: Secondary | ICD-10-CM

## 2021-11-19 MED ORDER — PREDNISONE 20 MG PO TABS: 20.0000 mg | ORAL_TABLET | Freq: Every day | ORAL | 0 refills | Status: DC

## 2021-11-19 MED ORDER — AMOXICILLIN 875 MG PO TABS: 875.0000 mg | ORAL_TABLET | Freq: Two times a day (BID) | ORAL | 0 refills | Status: AC

## 2021-11-19 NOTE — Patient Instructions (Signed)

## 2021-11-19 NOTE — Progress Notes (Signed)
? ?Acute Office Visit ? ?Subjective:  ? ? Patient ID: Felicia Pugh, female    DOB: [birth year], 76 y.o.   MRN: [id number] ? ?Chief Complaint  ?Patient presents with  ? Cough  ? Nasal Congestion  ? Wheezing  ?  X 8 days  ? ? ?HPI ?Patient is in today for cough and congestion x 8 days. Reports occasional wheezing. She is now having some soreness in her right upper ribs from coughing so much. She reports that symptoms have been unchanged despite OTC treatment. She has been taking tylenol cold and sinus, flonase, cloricidin, and clartin without improvement. Denies chest pain, shortness of breath, edema, fever, chills, nausea, vomiting, or diarrhea. Reports diabetes in well controlled. Fasting blood sugar this am was 111. ? ?Past Medical History:  ?Diagnosis Date  ? Anemia   ? H/O myelodysplastic anemis? used to see Dr. Sonny Dandy, no longer seeing anyone (02/2015)  ? Cataract   ? Complication of anesthesia   ? difficulty breathing after waking up from anesthesia  ? Crohn's disease ([place]) 2016  ? diagnosed in 2016 and started on Colazal, but inflammation/ulceration in ileum/cecum dates back to 2006.  ? Diabetes mellitus without complication ([place])   ? Diabetic neuropathy ([place])   ? Hyperlipidemia   ? Hypertension   ? IBS (irritable bowel syndrome)   ? Stroke ([place]) 08/12/2009  ? Syncope and collapse   ? Thyroid nodule   ? Ulcer   ? ? ?Past Surgical History:  ?Procedure Laterality Date  ? ABDOMINAL HYSTERECTOMY    ? partial  ? BACK SURGERY    ? x3 (Dr Loyola Mast, Dr Patrice Paradise)  ? BREAST SURGERY Right 2002  ? cyst removed  ? COLONOSCOPY  2006  ? RMR: Normal colon except for ileocecal valve, eroded/ulcerated areas at ileocecal valve with mucosal hemorrhage present status post biopsy. ulcer (ileocecal valve, biopsy): Chronic active inflammation with ulceration, nonspecific.  ? COLONOSCOPY  09/06/2009  ? RMR: Suboptimal prep on the right side. Ulcers noted at IC valve, base of cecum, and scattered through food part of ascending colon.  Pathology wiht chronic active colitis, question Crohn's  ? COLONOSCOPY N/A 03/27/2015  ? Dr. Gala Romney: abnormal cecum and IC valve most consistent with IBD. TI intubated and appeared normal. Likely Crohn's disease. Patient denies NSAID use. No improvement with Lialda samples at time of initial diagnosis  ? COLONOSCOPY N/A 05/15/2017  ? Dr. Gala Romney: ab normal IC valve, s/p biopsy with active ileitis, diverticulosis in sigmoid and descending colon. Non-bleeding internal hemorrhoids  ? ESOPHAGOGASTRODUODENOSCOPY    ? HAND SURGERY Right   ? Carpel tunnel and right elbow  ? LAPAROSCOPIC APPENDECTOMY N/A 07/21/2016  ? Procedure: APPENDECTOMY LAPAROSCOPIC;  Surgeon: Aviva Signs, MD;  Location: [facility];  Service: General;  Laterality: N/A;  ? Small bowel capsule endoscopy  06/10/2005  ? RMR: single erosion versus AVM, distal ileum  ? Small bowel capsule endoscopy  prior to 05/2005  ? [place]: reported erosions/ulcerations per medical record. actual report unavailable.  ? ? ?Family History  ?Problem Relation Age of Onset  ? Hypertension Mother   ? Stroke Mother   ? Diabetes Father   ?     w/ BKA  ? Hypertension Father   ? Asthma Father   ? Cancer Sister   ? Diabetes Sister   ?     stomach  ? Breast cancer Sister   ? Diabetes Brother   ? Diabetes Sister   ? Cancer Sister   ?  breast  ? Hypertension Sister   ? Hypertension Daughter   ? Hypertension Son   ? Multiple sclerosis Son   ? Colon cancer Neg Hx   ? Inflammatory bowel disease Neg Hx   ? ? ?Social History  ? ?Socioeconomic History  ? Marital status: Married  ?  Spouse name: Herbie Baltimore  ? Number of children: 5  ? Years of education: 9th grade-then got her GED at [employer]  ? Highest education level: GED or equivalent  ?Occupational History  ? Occupation: retired  ?Tobacco Use  ? Smoking status: Never  ? Smokeless tobacco: Never  ?Vaping Use  ? Vaping Use: Never used  ?Substance and Sexual Activity  ? Alcohol use: No  ? Drug use: No  ? Sexual activity: Not Currently  ?Other Topics Concern   ? Not on file  ?Social History Narrative  ? Lives home with her husband - he is on dialysis - she helps care for him  ? Their children live nearby  ? She babysits her 29 year old grandchild  ? ?Social Determinants of Health  ? ?Financial Resource Strain: Low Risk   ? Difficulty of Paying Living Expenses: Not hard at all  ?Food Insecurity: No Food Insecurity  ? Worried About Charity fundraiser in the Last Year: Never true  ? Ran Out of Food in the Last Year: Never true  ?Transportation Needs: No Transportation Needs  ? Lack of Transportation (Medical): No  ? Lack of Transportation (Non-Medical): No  ?Physical Activity: Insufficiently Active  ? Days of Exercise per Week: 7 days  ? Minutes of Exercise per Session: 20 min  ?Stress: No Stress Concern Present  ? Feeling of Stress : Not at all  ?Social Connections: Socially Integrated  ? Frequency of Communication with Friends and Family: More than three times a week  ? Frequency of Social Gatherings with Friends and Family: More than three times a week  ? Attends Religious Services: More than 4 times per year  ? Active Member of Clubs or Organizations: Yes  ? Attends Archivist Meetings: More than 4 times per year  ? Marital Status: Married  ?Intimate Partner Violence: Not At Risk  ? Fear of Current or Ex-Partner: No  ? Emotionally Abused: No  ? Physically Abused: No  ? Sexually Abused: No  ? ? ?Outpatient Medications Prior to Visit  ?Medication Sig Dispense Refill  ? atorvastatin (LIPITOR) 40 MG tablet Take 1 tablet (40 mg total) by mouth daily. 90 tablet 3  ? balsalazide (COLAZAL) 750 MG capsule TAKE 3 CAPSULES BY MOUTH THREE TIMES DAILY 810 capsule 1  ? cholecalciferol (VITAMIN D3) 25 MCG (1000 UNIT) tablet Take 1,000 Units by mouth daily.    ? Empagliflozin-linaGLIPtin (GLYXAMBI) 25-5 MG TABS Take 1 tablet by mouth daily. 90 tablet 3  ? Ferrous Gluconate (IRON 27 PO) Take by mouth daily.    ? glucose blood (ONETOUCH VERIO) test strip USE 1 STRIP TO CHECK  GLUCOSE TWICE DAILY 200 each 2  ? insulin degludec (TRESIBA FLEXTOUCH) 100 UNIT/ML FlexTouch Pen Inject 40 Units into the skin daily. 30 mL 3  ? Insulin Pen Needle (PEN NEEDLES) 31G X 5 MM MISC Use to give insulin with flexpen 100 each 1  ? Insulin Syringe-Needle U-100 31G X 1/4" 1 ML MISC 1 Syringe by Does not apply route 2 (two) times daily. 60 each 6  ? loperamide (IMODIUM) 1 MG/5ML solution Take 1 mg by mouth as needed for diarrhea or loose  stools.    ? losartan (COZAAR) 25 MG tablet Take 1 tablet (25 mg total) by mouth daily. 90 tablet 3  ? Multiple Vitamins-Minerals (WOMENS 50+ MULTI VITAMIN/MIN PO) Take by mouth.    ? nystatin (MYCOSTATIN/NYSTOP) powder APPLY  POWDER TOPICALLY TO AFFECTED AREA THREE TIMES DAILY 60 g 0  ? vitamin C (ASCORBIC ACID) 250 MG tablet Take 250 mg by mouth daily.    ? ?No facility-administered medications prior to visit.  ? ? ?Allergies  ?Allergen Reactions  ? Metformin And Related   ?  Severe diarrhea  ? Asa [Aspirin]   ?  GI Dr does not want her to take due to hx ulcers  ? Nsaids   ?  GI Dr does not want her to take due to hx of ulcers  ? Other Diarrhea  ?  Steroids  ? Tolmetin Other (See Comments)  ?  GI Dr does not want her to take due to hx of ulcers ?GI Dr does not want her to take due to hx of ulcers ?  ? ? ?Review of Systems ?As per HPI.  ?   ?Objective:  ?  ?Physical Exam ?Vitals and nursing note reviewed.  ?Constitutional:   ?   General: She is not in acute distress. ?   Appearance: She is not ill-appearing, toxic-appearing or diaphoretic.  ?HENT:  ?   Head: Normocephalic and atraumatic.  ?   Right Ear: No drainage, swelling or tenderness. A middle ear effusion is present. Tympanic membrane is not perforated, erythematous, retracted or bulging.  ?   Left Ear: No drainage, swelling or tenderness. A middle ear effusion is present. Tympanic membrane is not perforated, erythematous, retracted or bulging.  ?   Nose: Congestion present.  ?   Mouth/Throat:  ?   Mouth: Mucous  membranes are moist.  ?   Pharynx: Oropharynx is clear. No oropharyngeal exudate or posterior oropharyngeal erythema.  ?Eyes:  ?   Extraocular Movements: Extraocular movements intact.  ?   Pupils: Pupils are equal,

## 2021-12-04 NOTE — Progress Notes (Signed)
? ? ?Referring Provider: Dettinger, Fransisca Kaufmann, MD ?Primary Care Physician:  Dettinger, Fransisca Kaufmann, MD ?Primary GI Physician: Dr. Gala Romney ? ?Chief Complaint  ?Patient presents with  ? Follow-up  ?  Diarrhea   ? ? ?HPI:   ?Felicia Pugh is a 76 y.o. 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 Also with history of osteopenia noted on DEXA scan in 2018.  She is presenting today for follow-up of Crohn's disease and chronic diarrhea. ? ?Last colonoscopy in 2018 with focal active ileitis on biopsy, nonbleeding internal hemorrhoids. ? ?Last seen in our office 06/07/2021.  She was doing well overall.  Continue with intermittent diarrhea if going out, specifically if traveling longer distance such as to [place] or [place] for her husband's doctors appointments.  On these days, could have 3-6 loose bowel movements per day, using 1-2 Imodium which worked well.  Denied watery stools.  She did have associated mild lower abdominal discomfort prior to a bowel movement that improves thereafter.  No BRBPR or melena.  On the days that she is currently traveling short distance, she does not require Imodium and does not have diarrhea.  In fact, she may skip a day with bowel movements.  She was only taking 2 Colazal out of the morning and 2 in the evening she forgot to be taking 3 TID  Has no significant upper GI symptoms.  She was taking 125 mcg of vitamin D per day and a women's over 50 multivitamin daily.  She was overdue for DEXA.  Recommended completing DEXA scan and increasing Colazide to 3 capsules 3 times daily, continue Imodium as needed.  Will discuss with Dr. Gala Romney when her next colonoscopy was due. ? ?DEXA was  normal. ? ?Dr. Gala Romney recommended 5-year interval colonoscopy which means she would be due in October 2023. ? ?Today:  ?Continues to have intermittent diarrhea if going out of town for doctors appointments.  She can have 3-7 bowel movements on these days that are mushy.  The first couple of bowel movements are normal in amount, but then only passing very small amounts thereafter.  No watery stools.  No nocturnal stools.  No BRBPR, melena, unintentional weight loss.  She will take 1 tablespoon of Imodium after her first loose stool, then if she has a few more loose bowel movements, she will take another tablespoon.  ? ?On the days that she does not go out, or just has to go somewhere locally in town, she has no diarrhea.  She may have 1 bowel movement or may actually skip a day between bowel movements. ? ?Currently taking 3 colazal 3 times daily.  This did not provide any improvement in her intermittent diarrhea. ? ?No upper GI problems such as nausea, vomiting, reflux symptoms, or dysphagia. ? ?Past Medical History:  ?Diagnosis Date  ? Anemia   ? H/O myelodysplastic anemis? used to see Dr. Sonny Dandy, no longer seeing anyone (02/2015)  ? Cataract   ? Complication of anesthesia   ? difficulty breathing after waking up from anesthesia  ? Crohn's disease ([place]) 2016  ? diagnosed in 2016 and started on Colazal, but inflammation/ulceration in ileum/cecum dates back to 2006.  ? Diabetes mellitus without complication ([place])   ? Diabetic neuropathy ([place])   ?  Hyperlipidemia   ? Hypertension   ? IBS (irritable bowel syndrome)   ? Stroke ([place]) 08/12/2009  ? Syncope and collapse   ? Thyroid nodule   ? Ulcer   ? ? ?Past Surgical History:  ?Procedure Laterality Date  ? ABDOMINAL HYSTERECTOMY    ? partial  ? BACK SURGERY    ? x3 (Dr Loyola Mast, Dr Patrice Paradise)  ? BREAST SURGERY Right 2002  ? cyst removed  ? COLONOSCOPY  2006  ? RMR: Normal colon except for ileocecal valve, eroded/ulcerated areas at ileocecal valve with mucosal hemorrhage present  status post biopsy. ulcer (ileocecal valve, biopsy): Chronic active inflammation with ulceration, nonspecific.  ? COLONOSCOPY  09/06/2009  ? RMR: Suboptimal prep on the right side. Ulcers noted at IC valve, base of cecum, and scattered through food part of ascending colon. Pathology wiht chronic active colitis, question Crohn's  ? COLONOSCOPY N/A 03/27/2015  ? Dr. Gala Romney: abnormal cecum and IC valve most consistent with IBD. TI intubated and appeared normal. Likely Crohn's disease. Patient denies NSAID use. No improvement with Lialda samples at time of initial diagnosis  ? COLONOSCOPY N/A 05/15/2017  ? Dr. Gala Romney: ab normal IC valve, s/p biopsy with active ileitis, diverticulosis in sigmoid and descending colon. Non-bleeding internal hemorrhoids  ? ESOPHAGOGASTRODUODENOSCOPY    ? HAND SURGERY Right   ? Carpel tunnel and right elbow  ? LAPAROSCOPIC APPENDECTOMY N/A 07/21/2016  ? Procedure: APPENDECTOMY LAPAROSCOPIC;  Surgeon: Aviva Signs, MD;  Location: [facility];  Service: General;  Laterality: N/A;  ? Small bowel capsule endoscopy  06/10/2005  ? RMR: single erosion versus AVM, distal ileum  ? Small bowel capsule endoscopy  prior to 05/2005  ? [place]: reported erosions/ulcerations per medical record. actual report unavailable.  ? ? ?Current Outpatient Medications  ?Medication Sig Dispense Refill  ? atorvastatin (LIPITOR) 40 MG tablet Take 1 tablet (40 mg total) by mouth daily. 90 tablet 3  ? balsalazide (COLAZAL) 750 MG capsule TAKE 3 CAPSULES BY MOUTH THREE TIMES DAILY 810 capsule 1  ? Empagliflozin-linaGLIPtin (GLYXAMBI) 25-5 MG TABS Take 1 tablet by mouth daily. 90 tablet 3  ? glucose blood (ONETOUCH VERIO) test strip USE 1 STRIP TO CHECK GLUCOSE TWICE DAILY 200 each 2  ? insulin degludec (TRESIBA FLEXTOUCH) 100 UNIT/ML FlexTouch Pen Inject 40 Units into the skin daily. 30 mL 3  ? Insulin Pen Needle (PEN NEEDLES) 31G X 5 MM MISC Use to give insulin with flexpen 100 each 1  ? Insulin Syringe-Needle U-100 31G X 1/4" 1 ML  MISC 1 Syringe by Does not apply route 2 (two) times daily. 60 each 6  ? loperamide (IMODIUM) 1 MG/5ML solution Take 1 mg by mouth as needed for diarrhea or loose stools.    ? losartan (COZAAR) 25 MG tablet Take 1 tablet (25 mg total) by mouth daily. 90 tablet 3  ? Multiple Vitamins-Minerals (WOMENS 50+ MULTI VITAMIN/MIN PO) Take by mouth.    ? cholecalciferol (VITAMIN D3) 25 MCG (1000 UNIT) tablet Take 1,000 Units by mouth daily. (Patient not taking: Reported on 12/06/2021)    ? vitamin C (ASCORBIC ACID) 250 MG tablet Take 250 mg by mouth daily. (Patient not taking: Reported on 12/06/2021)    ? ?No current facility-administered medications for this visit.  ? ? ?Allergies as of 12/06/2021 - Review Complete 12/06/2021  ?Allergen Reaction Noted  ? Metformin and related  05/11/2014  ? Asa [aspirin]  11/04/2012  ? Nsaids  11/04/2012  ? Other Diarrhea 12/25/2014  ? Tolmetin Other (  See Comments) 11/04/2012  ? ? ?Family History  ?Problem Relation Age of Onset  ? Hypertension Mother   ? Stroke Mother   ? Diabetes Father   ?     w/ BKA  ? Hypertension Father   ? Asthma Father   ? Cancer Sister   ? Diabetes Sister   ?     stomach  ? Breast cancer Sister   ? Diabetes Brother   ? Diabetes Sister   ? Cancer Sister   ?     breast  ? Hypertension Sister   ? Hypertension Daughter   ? Hypertension Son   ? Multiple sclerosis Son   ? Colon cancer Neg Hx   ? Inflammatory bowel disease Neg Hx   ? ? ?Social History  ? ?Socioeconomic History  ? Marital status: Married  ?  Spouse name: Herbie Baltimore  ? Number of children: 5  ? Years of education: 9th grade-then got her GED at [employer]  ? Highest education level: GED or equivalent  ?Occupational History  ? Occupation: retired  ?Tobacco Use  ? Smoking status: Never  ? Smokeless tobacco: Never  ?Vaping Use  ? Vaping Use: Never used  ?Substance and Sexual Activity  ? Alcohol use: No  ? Drug use: No  ? Sexual activity: Not Currently  ?Other Topics Concern  ? Not on file  ?Social History Narrative  ? Lives  home with her husband - he is on dialysis - she helps care for him  ? Their children live nearby  ? She babysits her 41 year old grandchild  ? ?Social Determinants of Health  ? ?Financial Resource Strain: Low Risk   ? D

## 2021-12-05 ENCOUNTER — Ambulatory Visit: Payer: Medicare PPO

## 2021-12-06 ENCOUNTER — Encounter: Payer: Self-pay | Admitting: Gastroenterology

## 2021-12-06 ENCOUNTER — Ambulatory Visit (INDEPENDENT_AMBULATORY_CARE_PROVIDER_SITE_OTHER): Payer: Medicare PPO | Admitting: Gastroenterology

## 2021-12-06 VITALS — BP 128/74 | HR 75 | Temp 97.5°F | Ht 62.0 in | Wt 170.8 lb

## 2021-12-06 DIAGNOSIS — R195 Other fecal abnormalities: Secondary | ICD-10-CM

## 2021-12-06 DIAGNOSIS — K5080 Crohn's disease of both small and large intestine without complications: Secondary | ICD-10-CM

## 2021-12-06 NOTE — Patient Instructions (Signed)
Decrease Colazal to 2 capsules twice daily. ? ?To help with your intermittent diarrhea, take 30 mL (2 tablespoons) first in the morning after your first loose stool, then take 1 tablespoon (15 mL) after each additional loose stool with a max of 4 tablespoons (60 mL) per day. ? ?Resume taking vitamin D daily. ? ?We will plan to see back in September and will discuss scheduling her colonoscopy at that time. ? ?It was great to see you again today! ? ?Aliene Altes, PA-C ?[facility] ? ? ? ?

## 2021-12-07 ENCOUNTER — Ambulatory Visit: Admitting: Gastroenterology

## 2021-12-10 ENCOUNTER — Telehealth: Payer: Self-pay | Admitting: *Deleted

## 2021-12-10 ENCOUNTER — Other Ambulatory Visit: Payer: Self-pay | Admitting: Gastroenterology

## 2021-12-10 DIAGNOSIS — K219 Gastro-esophageal reflux disease without esophagitis: Secondary | ICD-10-CM

## 2021-12-10 MED ORDER — BALSALAZIDE DISODIUM 750 MG PO CAPS: 1500.0000 mg | ORAL_CAPSULE | Freq: Two times a day (BID) | ORAL | 3 refills | Status: DC

## 2021-12-10 NOTE — Telephone Encounter (Signed)
[facility] faxed over RX refill request for Balsalazide 750 mg capsule to take 3 capsules by mouth three times daily.  Pt last seen on 12/06/2021 by Aliene Altes, PA-C. ?

## 2021-12-10 NOTE — Telephone Encounter (Signed)
Updated prescription for balsalazide 750 mg capsule, take 2 capsules twice daily, sent to pharmacy. See last OV note. Planned to decrease current regimen.  ?

## 2021-12-12 ENCOUNTER — Ambulatory Visit: Payer: Medicare PPO | Attending: Rehabilitation

## 2021-12-12 DIAGNOSIS — M25552 Pain in left hip: Secondary | ICD-10-CM | POA: Insufficient documentation

## 2021-12-12 DIAGNOSIS — M6281 Muscle weakness (generalized): Secondary | ICD-10-CM | POA: Insufficient documentation

## 2021-12-12 DIAGNOSIS — M5459 Other low back pain: Secondary | ICD-10-CM | POA: Insufficient documentation

## 2021-12-12 NOTE — Addendum Note (Signed)
Addended by: Darlin Coco on: 12/12/2021 03:23 PM ? ? Modules accepted: Orders ? ?

## 2021-12-12 NOTE — Therapy (Addendum)
[facility] [address] [place], [place], [address] Phone: [phone]   Fax:  [phone]  Physical Therapy Evaluation  Patient Details  Name: Felicia Pugh MRN: [id number] Date of Birth: August 20, 1945 Referring Provider (PT): [place], [place]   Encounter Date: 12/12/2021   PT End of Session - 12/12/21 1301     Visit Number 1    Number of Visits 12    Date for PT Re-Evaluation 03/08/22    PT Start Time 1302    PT Stop Time 1350    PT Time Calculation (min) 48 min    Activity Tolerance Patient tolerated treatment well    Behavior During Therapy [facility] for tasks assessed/performed             Past Medical History:  Diagnosis Date   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 Cleone Slim, no longer seeing anyone (02/2015)   Cataract    Complication of anesthesia    difficulty breathing after waking up from anesthesia   Crohn's disease (HCC) 2016   diagnosed in 2016 and started on Colazal, but inflammation/ulceration in ileum/cecum dates back to 2006.   Diabetes mellitus without complication (HCC)    Diabetic neuropathy (HCC)    Hyperlipidemia    Hypertension    IBS (irritable bowel syndrome)    Stroke (HCC) 08/12/2009   Syncope and collapse    Thyroid nodule    Ulcer     Past Surgical History:  Procedure Laterality Date   ABDOMINAL HYSTERECTOMY     partial   BACK SURGERY     x3 (Dr Alben Deeds, Dr Noel Gerold)   BREAST SURGERY Right 2002   cyst removed   COLONOSCOPY  2006   RMR: Normal colon except for ileocecal valve, eroded/ulcerated areas at ileocecal valve with mucosal hemorrhage present status post biopsy. ulcer (ileocecal valve, biopsy): Chronic active inflammation with ulceration, nonspecific.   COLONOSCOPY  09/06/2009   RMR: Suboptimal prep on the right side. Ulcers noted at IC valve, base of cecum, and scattered through food part of ascending colon. Pathology wiht chronic active colitis, question Crohn's   COLONOSCOPY  N/A 03/27/2015   Dr. Jena Gauss: abnormal cecum and IC valve most consistent with IBD. TI intubated and appeared normal. Likely Crohn's disease. Patient denies NSAID use. No improvement with Lialda samples at time of initial diagnosis   COLONOSCOPY N/A 05/15/2017   Dr. Jena Gauss: ab normal IC valve, s/p biopsy with active ileitis, diverticulosis in sigmoid and descending colon. Non-bleeding internal hemorrhoids   ESOPHAGOGASTRODUODENOSCOPY     HAND SURGERY Right    Carpel tunnel and right elbow   LAPAROSCOPIC APPENDECTOMY N/A 07/21/2016   Procedure: APPENDECTOMY LAPAROSCOPIC;  Surgeon: Franky Macho, MD;  Location: [facility];  Service: General;  Laterality: N/A;   Small bowel capsule endoscopy  06/10/2005   RMR: single erosion versus AVM, distal ileum   Small bowel capsule endoscopy  prior to 05/2005   [place]: reported erosions/ulcerations per medical record. actual report unavailable.    There were no vitals filed for this visit.    Subjective Assessment - 12/12/21 1301     Subjective Patient reports that she has had multiple lumbar surgeries over the years, but has "nerver gotten out of pain." However, she had an injection in her hip yesterday which has helped to ease her pain slightly, but she felt her last injection lasted about 4 days. She feels that her pain has been steadily getting worse.    Pertinent History multiple lumbar  surgeries, CVA in 2010    Limitations Sitting;Standing;Walking;House hold activities    How long can you sit comfortably? about 30 minutes at most (limited by left hip)    How long can you walk comfortably? about 45 minutes (limited by low back)    Patient Stated Goals reduced pain, sleep longer (less than 3 hours prior to the injection), sit and stand longer    Currently in Pain? Yes    Pain Score 8     Pain Location Back    Pain Orientation Lower;Left    Pain Descriptors / Indicators Throbbing    Pain Type Chronic pain    Pain Radiating Towards from left low back along  posterior left leg to the heel    Pain Onset More than a month ago    Pain Frequency Constant    Aggravating Factors  walking, standing, lifting, washing dishes    Pain Relieving Factors injections, medication    Effect of Pain on Daily Activities limits her abiltiy to complete household activities    Multiple Pain Sites Yes    Pain Score 9    Pain Location Hip    Pain Orientation Left;Anterior;Lateral    Pain Descriptors / Indicators Other (Comment)   "it's a hard hurt"   Pain Type Chronic pain    Pain Onset More than a month ago    Pain Frequency Constant    Aggravating Factors  sitting, walking, laying down to sleep    Pain Relieving Factors injections, brief sitting, medication    Effect of Pain on Daily Activities limits her ability to complete household activities                [facility] PT Assessment - 12/12/21 0001       Assessment   Medical Diagnosis Lumbar spinal stenosis; left hip OA    Referring Provider (PT) Czinsky, PA-C    Onset Date/Surgical Date --   years   Hand Dominance Left    Next MD Visit 01/08/22    Prior Therapy Not since 2004 after first back surgery      Precautions   Precautions Fall      Restrictions   Weight Bearing Restrictions No      Balance Screen   Has the patient fallen in the past 6 months Yes    How many times? 4   1st; twisting in store; 2nd: walking into her house; 3rd: left leg gave away; 4th: getting out of her sons truck   Has the patient had a decrease in activity level because of a fear of falling?  Yes    Is the patient reluctant to leave their home because of a fear of falling?  No      Home [employer] information [employer] manager residence    Home Access Stairs to enter;Ramped entrance    Entrance Stairs-Number of Steps 1    Home Layout Two level    Alternate Level Stairs-Number of Steps 10    Alternate Level Stairs-Rails Left    Home Equipment None   was told to use a cane, but "it just gets in the way"     Prior  Function   Level of Independence Independent    Leisure caring for her husband and grandchildren, housework      Cognition   Overall Cognitive Status Within Functional Limits for tasks assessed    Attention Focused    Focused Attention Appears intact    Memory Appears intact  Awareness Appears intact    Problem Solving Appears intact      Sensation   Additional Comments Patient reports numbness 2nd and 3rd fingers of her left hand      ROM / Strength   AROM / PROM / Strength Strength;AROM      AROM   AROM Assessment Site Lumbar;Hip    Right/Left Hip Left    Left Hip Flexion 97   pulling in "back of the leg"   Left Hip ABduction 14   pulling in groin   Lumbar Flexion 34    Lumbar Extension 10   low back and left gluteal pain   Lumbar - Right Side Bend 25% limited    Lumbar - Left Side Bend 25% limited   pulling in left low back   Lumbar - Right Rotation 50% limited   limited by low back pulling   Lumbar - Left Rotation 50% limited   limited by low back pulling     Strength   Strength Assessment Site Hip;Knee;Ankle    Right/Left Hip Right;Left    Right Hip Flexion 3/5    Left Hip Flexion 3/5   painful   Right/Left Knee Right;Left    Right Knee Flexion 4-/5    Right Knee Extension 4+/5    Left Knee Flexion 4-/5   hamstring pain   Left Knee Extension 4-/5   hamstring pain   Right/Left Ankle Right;Left    Right Ankle Dorsiflexion 4-/5    Left Ankle Dorsiflexion 3+/5      Palpation   Palpation comment TTP: left lumbar paraspinals, QL, obliques, gluteals, TFL, IT band, piriformis, and quadriceps      Special Tests    Special Tests Hip Special Tests    Hip Special Tests  Hip Scouring      Hip Scouring   Findings Unable to test    Side Left    Comments due to posterior hip pain with hip flexion                        Objective measurements completed on examination: See above findings.       [facility] Adult PT Treatment/Exercise - 12/12/21 0001        Exercises   Exercises Knee/Hip      Knee/Hip Exercises: Stretches   Other Knee/Hip Stretches Lower trunk rotation   20 reps     Knee/Hip Exercises: Seated   Other Seated Knee/Hip Exercises Slouch overcorrect   held due to pain   Other Seated Knee/Hip Exercises Hip adduction isometric   5 second hold; 20 reps     Knee/Hip Exercises: Supine   Other Supine Knee/Hip Exercises Glute set   discomfort; 12 reps; 5 second hold                         PT Long Term Goals - 12/12/21 1505       PT LONG TERM GOAL #1   Title Patient will be independent with her HEP.    Time 6    Period Weeks    Status New    Target Date 01/16/22      PT LONG TERM GOAL #2   Title Patient will report being able to sleep at least 5 hours without being awakened by her familiar low back and hip pain.    Time 6    Period Weeks    Status New    Target Date  01/16/22      PT LONG TERM GOAL #3   Title Patient will be able to complete her daily activities without her familair pain exceeding a 6/10.    Time 6    Period Weeks    Status New    Target Date 01/16/22                    Plan - 12/12/21 1450     Clinical Impression Statement Patient is a 76 year old female presenting to physical therapy with chronic low back and left hip pain. She presented with moderate pain severity and irritability. She exhibited reduced lower extremity strength and mobility. She is at an elevated fall risk due to her history of multiple falls in the previous six months. Recommend that she continue with skilled physical therapy to address her remaining impairments to improved her safety and functional mobility.    Personal Factors and Comorbidities Time since onset of injury/illness/exacerbation;Comorbidity 3+    Comorbidities HTN, DM, osteopenia, history of CVA    Examination-Activity Limitations Locomotion Level;Sit;Sleep;Stand;Lift    Examination-Participation Restrictions Church;Cleaning;Community  Activity;Shop    Stability/Clinical Decision Making Evolving/Moderate complexity    Clinical Decision Making Moderate    Rehab Potential Good    PT Frequency 2x / week    PT Duration 6 weeks    PT Treatment/Interventions ADLs/Self Care Home Management;Electrical Stimulation;Cryotherapy;Moist Heat;Traction;Ultrasound;Neuromuscular re-education;Balance training;Therapeutic exercise;Therapeutic activities;Functional mobility training;Gait training;Stair training;Patient/family education;Manual techniques;Passive range of motion;Taping    PT Next Visit Plan nustep, lower extremity strengthening and balance interventions, and modalities as needed    PT Home Exercise Plan Access Code: [email or web address]  URL: [email or web address]  Date: 12/12/2021  Prepared by: Candi Leash    Exercises  - Lower Trunk Rotations  - 2 x daily - 7 x weekly - 3 sets - 10 reps  - Seated Hip Adduction Isometrics with Ball  - 1 x daily - 7 x weekly - 2 sets - 10 reps - 3-5 seconds  hold    Consulted and Agree with Plan of Care Patient             Patient will benefit from skilled therapeutic intervention in order to improve the following deficits and impairments:  Difficulty walking, Decreased range of motion, Impaired tone, Decreased activity tolerance, Pain, Decreased balance, Hypomobility, Decreased strength, Decreased mobility  Visit Diagnosis: Other low back pain - Plan: PT plan of care cert/re-cert  Pain in left hip - Plan: PT plan of care cert/re-cert  Muscle weakness (generalized) - Plan: PT plan of care cert/re-cert     Problem List Patient Active Problem List   Diagnosis Date Noted   Crohn's disease of both small and large intestine without complication (HCC) 06/07/2021   Loose stools 06/07/2021   Bilateral carpal tunnel syndrome 10/17/2020   Obesity (BMI 30-39.9) 05/08/2017   Solitary pulmonary nodule 05/08/2017   GERD (gastroesophageal reflux disease) 05/02/2017   Type 2 diabetes mellitus  with other specified complication (HCC) 05/16/2015   Crohn's disease with complication (HCC) 05/16/2015   Diverticulosis of colon without hemorrhage    History of stroke 07/08/2014   Osteopenia 05/11/2014   Hyperlipidemia associated with type 2 diabetes mellitus (HCC) 06/01/2008   Anemia, chronic disease 06/01/2008   Hypertension associated with diabetes (HCC) 06/01/2008    Granville Lewis, PT 12/12/2021, 3:21 PM  [facility] [address] [place], [place], [address] Phone: [phone]   Fax:  [phone]  Name: DELISE HAMIDI MRN:  [id number] Date of Birth: 1946/01/11

## 2021-12-17 ENCOUNTER — Ambulatory Visit: Payer: Medicare PPO

## 2021-12-17 DIAGNOSIS — M25552 Pain in left hip: Secondary | ICD-10-CM

## 2021-12-17 DIAGNOSIS — M5459 Other low back pain: Secondary | ICD-10-CM

## 2021-12-17 DIAGNOSIS — M6281 Muscle weakness (generalized): Secondary | ICD-10-CM

## 2021-12-17 NOTE — Therapy (Signed)
[facility] ?[place] ?[place] [place], [place], [address] ?Phone: [phone]   Fax:  [phone] ? ?Physical Therapy Treatment ? ?Patient Details  ?Name: Felicia Pugh ?MRN: [id number] ?Date of Birth: 21-Apr-1946 ?Referring Provider (PT): [place], PA-C ? ? ?Encounter Date: 12/17/2021 ? ? PT End of Session - 12/17/21 1122   ? ? Visit Number 2   ? Number of Visits 12   ? Date for PT Re-Evaluation 03/08/22   ? PT Start Time 1115   ? PT Stop Time 1212   ? PT Time Calculation (min) 57 min   ? Activity Tolerance Patient tolerated treatment well   ? Behavior During Therapy [facility] for tasks assessed/performed   ? ?  ?  ? ?  ? ? ?Past Medical History:  ?Diagnosis Date  ? Anemia   ? H/O myelodysplastic anemis? used to see Dr. Sonny Dandy, no longer seeing anyone (02/2015)  ? Cataract   ? Complication of anesthesia   ? difficulty breathing after waking up from anesthesia  ? Crohn's disease ([place]) 2016  ? diagnosed in 2016 and started on Colazal, but inflammation/ulceration in ileum/cecum dates back to 2006.  ? Diabetes mellitus without complication ([place])   ? Diabetic neuropathy ([place])   ? Hyperlipidemia   ? Hypertension   ? IBS (irritable bowel syndrome)   ? Stroke ([place]) 08/12/2009  ? Syncope and collapse   ? Thyroid nodule   ? Ulcer   ? ? ?Past Surgical History:  ?Procedure Laterality Date  ? ABDOMINAL HYSTERECTOMY    ? partial  ? BACK SURGERY    ? x3 (Dr Loyola Mast, Dr Patrice Paradise)  ? BREAST SURGERY Right 2002  ? cyst removed  ? COLONOSCOPY  2006  ? RMR: Normal colon except for ileocecal valve, eroded/ulcerated areas at ileocecal valve with mucosal hemorrhage present status post biopsy. ulcer (ileocecal valve, biopsy): Chronic active inflammation with ulceration, nonspecific.  ? COLONOSCOPY  09/06/2009  ? RMR: Suboptimal prep on the right side. Ulcers noted at IC valve, base of cecum, and scattered through food part of ascending colon. Pathology wiht chronic active colitis, question Crohn's  ? COLONOSCOPY  N/A 03/27/2015  ? Dr. Gala Romney: abnormal cecum and IC valve most consistent with IBD. TI intubated and appeared normal. Likely Crohn's disease. Patient denies NSAID use. No improvement with Lialda samples at time of initial diagnosis  ? COLONOSCOPY N/A 05/15/2017  ? Dr. Gala Romney: ab normal IC valve, s/p biopsy with active ileitis, diverticulosis in sigmoid and descending colon. Non-bleeding internal hemorrhoids  ? ESOPHAGOGASTRODUODENOSCOPY    ? HAND SURGERY Right   ? Carpel tunnel and right elbow  ? LAPAROSCOPIC APPENDECTOMY N/A 07/21/2016  ? Procedure: APPENDECTOMY LAPAROSCOPIC;  Surgeon: Aviva Signs, MD;  Location: [facility];  Service: General;  Laterality: N/A;  ? Small bowel capsule endoscopy  06/10/2005  ? RMR: single erosion versus AVM, distal ileum  ? Small bowel capsule endoscopy  prior to 05/2005  ? [place]: reported erosions/ulcerations per medical record. actual report unavailable.  ? ? ?There were no vitals filed for this visit. ? ? Subjective Assessment - 12/17/21 1120   ? ? Subjective Pt arrives for today's treatment session reporting 6/10 left hip pain and 7/10 left knee pain.   ? Pertinent History multiple lumbar surgeries, CVA in 2010   ? Limitations Sitting;Standing;Walking;House hold activities   ? How long can you sit comfortably? about 30 minutes at most (limited by left hip)   ? How long can you walk comfortably? about 45 minutes (  limited by low back)   ? Patient Stated Goals reduced pain, sleep longer (less than 3 hours prior to the injection), sit and stand longer   ? Currently in Pain? Yes   ? Pain Score 7    ? Pain Location Hip   ? Pain Orientation Left   ? Pain Onset More than a month ago   ? Pain Onset More than a month ago   ? ?  ?  ? ?  ? ? ? ? ? ? ? ? ? ? ? ? ? ? ? ? ? ? ? ? [facility] Adult PT Treatment/Exercise - 12/17/21 0001   ? ?  ? Exercises  ? Exercises Knee/Hip   ?  ? Knee/Hip Exercises: Aerobic  ? Nustep Lvl 3 x 15 mins   ?  ? Knee/Hip Exercises: Standing  ? Heel Raises Both;20 reps   ? Heel  Raises Limitations Toe Raises x 20 reps   ? Hip Flexion Both;20 reps;Knee bent   ? Hip Abduction Both;20 reps;Knee straight   ? Rocker Board 3 minutes   ?  ? Modalities  ? Modalities Electrical Stimulation;Moist Heat   ?  ? Moist Heat Therapy  ? Number Minutes Moist Heat 15 Minutes   ? Moist Heat Location Hip   ?  ? Electrical Stimulation  ? Electrical Stimulation Location Left hip and knee   ? Electrical Stimulation Action IFC   ? Electrical Stimulation Parameters 80-150 Hz x 15 mins   ? Electrical Stimulation Goals Pain   ? ?  ?  ? ?  ? ? ? ? ? ? ? ? ? ? ? ? ? ? ? PT Long Term Goals - 12/12/21 1505   ? ?  ? PT LONG TERM GOAL #1  ? Title Patient will be independent with her HEP.   ? Time 6   ? Period Weeks   ? Status New   ? Target Date 01/16/22   ?  ? PT LONG TERM GOAL #2  ? Title Patient will report being able to sleep at least 5 hours without being awakened by her familiar low back and hip pain.   ? Time 6   ? Period Weeks   ? Status New   ? Target Date 01/16/22   ?  ? PT LONG TERM GOAL #3  ? Title Patient will be able to complete her daily activities without her familair pain exceeding a 6/10.   ? Time 6   ? Period Weeks   ? Status New   ? Target Date 01/16/22   ? ?  ?  ? ?  ? ? ? ? ? ? ? ? Plan - 12/17/21 1122   ? ? Clinical Impression Statement Pt arrives for today's treatment session reporting 6/10 left hip pain and 7/10 left knee pain.  Pt introduced to NuStep for warmup to increase endurance and activity tolerance.  Pt instructed in standing BLE exericses to increase strength, function, and safety, while decreasing fall risk.  Pt requiring min cues for proper technique and posture with all standing exercises.  Normal responses to estim and MH noted upon removal.  Pt reported 3/10 left hip pain and 4/10 left knee pain at completion of today's treatmetnt session.   ? Personal Factors and Comorbidities Time since onset of injury/illness/exacerbation;Comorbidity 3+   ? Comorbidities HTN, DM, osteopenia,  history of CVA   ? Examination-Activity Limitations Locomotion Level;Sit;Sleep;Stand;Lift   ? Examination-Participation Restrictions Church;Cleaning;Community Activity;Shop   ? Stability/Clinical  Decision Making Evolving/Moderate complexity   ? Rehab Potential Good   ? PT Frequency 2x / week   ? PT Duration 6 weeks   ? PT Treatment/Interventions ADLs/Self Care Home Management;Electrical Stimulation;Cryotherapy;Moist Heat;Traction;Ultrasound;Neuromuscular re-education;Balance training;Therapeutic exercise;Therapeutic activities;Functional mobility training;Gait training;Stair training;Patient/family education;Manual techniques;Passive range of motion;Taping   ? PT Next Visit Plan nustep, lower extremity strengthening and balance interventions, and modalities as needed   ? PT Home Exercise Plan Access Code: [email or web address]  URL: [email or web address]  Date: 12/12/2021  Prepared by: Jacqulynn Cadet    Exercises  - Lower Trunk Rotations  - 2 x daily - 7 x weekly - 3 sets - 10 reps  - Seated Hip Adduction Isometrics with Ball  - 1 x daily - 7 x weekly - 2 sets - 10 reps - 3-5 seconds  hold   ? Consulted and Agree with Plan of Care Patient   ? ?  ?  ? ?  ? ? ?Patient will benefit from skilled therapeutic intervention in order to improve the following deficits and impairments:  Difficulty walking, Decreased range of motion, Impaired tone, Decreased activity tolerance, Pain, Decreased balance, Hypomobility, Decreased strength, Decreased mobility ? ?Visit Diagnosis: ?Other low back pain ? ?Pain in left hip ? ?Muscle weakness (generalized) ? ? ? ? ?Problem List ?Patient Active Problem List  ? Diagnosis Date Noted  ? Crohn's disease of both small and large intestine without complication ([place]) 54/04/8118  ? Loose stools 06/07/2021  ? Bilateral carpal tunnel syndrome 10/17/2020  ? Obesity (BMI 30-39.9) 05/08/2017  ? Solitary pulmonary nodule 05/08/2017  ? GERD (gastroesophageal reflux disease) 05/02/2017  ? Type 2 diabetes  mellitus with other specified complication ([place]) 14/78/2956  ? Crohn's disease with complication ([place]) 21/30/8657  ? Diverticulosis of colon without hemorrhage   ? History of stroke 07/08/2014  ? Osteopenia 09

## 2021-12-21 ENCOUNTER — Ambulatory Visit: Payer: Medicare PPO

## 2021-12-21 DIAGNOSIS — M5459 Other low back pain: Secondary | ICD-10-CM | POA: Diagnosis not present

## 2021-12-21 DIAGNOSIS — M25552 Pain in left hip: Secondary | ICD-10-CM

## 2021-12-21 DIAGNOSIS — M6281 Muscle weakness (generalized): Secondary | ICD-10-CM

## 2021-12-21 NOTE — Therapy (Signed)
[place] [facility] ?[place] ?[place], [place], [address] ?Phone: [phone]   Fax:  [phone] ? ?Physical Therapy Treatment ? ?Patient Details  ?Name: Felicia Pugh ?MRN: [id number] ?Date of Birth: 03-Jun-1946 ?Referring Provider (PT): [place], PA-C ? ? ?Encounter Date: 12/21/2021 ? ? PT End of Session - 12/21/21 0907   ? ? Visit Number 3   ? Number of Visits 12   ? Date for PT Re-Evaluation 03/08/22   ? PT Start Time 0901   ? PT Stop Time 1002   ? PT Time Calculation (min) 61 min   ? Activity Tolerance Patient tolerated treatment well   ? Behavior During Therapy [facility] for tasks assessed/performed   ? ?  ?  ? ?  ? ? ?Past Medical History:  ?Diagnosis Date  ? Anemia   ? H/O myelodysplastic anemis? used to see Dr. Sonny Dandy, no longer seeing anyone (02/2015)  ? Cataract   ? Complication of anesthesia   ? difficulty breathing after waking up from anesthesia  ? Crohn's disease ([place]) 2016  ? diagnosed in 2016 and started on Colazal, but inflammation/ulceration in ileum/cecum dates back to 2006.  ? Diabetes mellitus without complication ([place])   ? Diabetic neuropathy ([place])   ? Hyperlipidemia   ? Hypertension   ? IBS (irritable bowel syndrome)   ? Stroke ([place]) 08/12/2009  ? Syncope and collapse   ? Thyroid nodule   ? Ulcer   ? ? ?Past Surgical History:  ?Procedure Laterality Date  ? ABDOMINAL HYSTERECTOMY    ? partial  ? BACK SURGERY    ? x3 (Dr Loyola Mast, Dr Patrice Paradise)  ? BREAST SURGERY Right 2002  ? cyst removed  ? COLONOSCOPY  2006  ? RMR: Normal colon except for ileocecal valve, eroded/ulcerated areas at ileocecal valve with mucosal hemorrhage present status post biopsy. ulcer (ileocecal valve, biopsy): Chronic active inflammation with ulceration, nonspecific.  ? COLONOSCOPY  09/06/2009  ? RMR: Suboptimal prep on the right side. Ulcers noted at IC valve, base of cecum, and scattered through food part of ascending colon. Pathology wiht chronic active colitis, question Crohn's  ? COLONOSCOPY  N/A 03/27/2015  ? Dr. Gala Romney: abnormal cecum and IC valve most consistent with IBD. TI intubated and appeared normal. Likely Crohn's disease. Patient denies NSAID use. No improvement with Lialda samples at time of initial diagnosis  ? COLONOSCOPY N/A 05/15/2017  ? Dr. Gala Romney: ab normal IC valve, s/p biopsy with active ileitis, diverticulosis in sigmoid and descending colon. Non-bleeding internal hemorrhoids  ? ESOPHAGOGASTRODUODENOSCOPY    ? HAND SURGERY Right   ? Carpel tunnel and right elbow  ? LAPAROSCOPIC APPENDECTOMY N/A 07/21/2016  ? Procedure: APPENDECTOMY LAPAROSCOPIC;  Surgeon: Aviva Signs, MD;  Location: [facility];  Service: General;  Laterality: N/A;  ? Small bowel capsule endoscopy  06/10/2005  ? RMR: single erosion versus AVM, distal ileum  ? Small bowel capsule endoscopy  prior to 05/2005  ? [place]: reported erosions/ulcerations per medical record. actual report unavailable.  ? ? ?There were no vitals filed for this visit. ? ? Subjective Assessment - 12/21/21 0905   ? ? Subjective Patient reports that her back and hip feel better since her last appointment.   ? Pertinent History multiple lumbar surgeries, CVA in 2010   ? Limitations Sitting;Standing;Walking;House hold activities   ? How long can you sit comfortably? about 30 minutes at most (limited by left hip)   ? How long can you walk comfortably? about 45 minutes (limited by low  back)   ? Patient Stated Goals reduced pain, sleep longer (less than 3 hours prior to the injection), sit and stand longer   ? Currently in Pain? Yes   ? Pain Score 5    ? Pain Location Hip   ? Pain Orientation Left   ? Pain Onset More than a month ago   ? Pain Score 5   ? Pain Location Back   ? Pain Onset More than a month ago   ? ?  ?  ? ?  ? ? ? ? ? ? ? ? ? ? ? ? ? ? ? ? ? ? ? ? [facility] Adult PT Treatment/Exercise - 12/21/21 0001   ? ?  ? Knee/Hip Exercises: Aerobic  ? Nustep Lvl 3 x 15 mins   ?  ? Knee/Hip Exercises: Standing  ? Rocker Board 5 minutes   ? Other Standing Knee  Exercises Ball press   5 second hold; 2 minutes  ? Other Standing Knee Exercises Ball roll out   3 minutes  ?  ? Modalities  ? Modalities Electrical Stimulation;Moist Heat   ?  ? Moist Heat Therapy  ? Number Minutes Moist Heat 15 Minutes   ? Moist Heat Location Lumbar Spine   ?  ? Electrical Stimulation  ? Electrical Stimulation Location bilateral lumbar paraspinals   no redness or adverse reaction to today's modalities  ? Electrical Stimulation Action IFC   ? Electrical Stimulation Parameters 80-150 Hz w/ 40% scan x 15 minutes   ? Electrical Stimulation Goals Pain   ?  ? Manual Therapy  ? Manual Therapy Joint mobilization   ? Joint Mobilization L3-5 CPA's grade I-III   ? ?  ?  ? ?  ? ? ? ? ? ? ? ? ? ? ? ? ? ? ? PT Long Term Goals - 12/12/21 1505   ? ?  ? PT LONG TERM GOAL #1  ? Title Patient will be independent with her HEP.   ? Time 6   ? Period Weeks   ? Status New   ? Target Date 01/16/22   ?  ? PT LONG TERM GOAL #2  ? Title Patient will report being able to sleep at least 5 hours without being awakened by her familiar low back and hip pain.   ? Time 6   ? Period Weeks   ? Status New   ? Target Date 01/16/22   ?  ? PT LONG TERM GOAL #3  ? Title Patient will be able to complete her daily activities without her familair pain exceeding a 6/10.   ? Time 6   ? Period Weeks   ? Status New   ? Target Date 01/16/22   ? ?  ?  ? ?  ? ? ? ? ? ? ? ? Plan - 12/21/21 0907   ? ? Clinical Impression Statement Patient was introduced to multiple new standing interventions for improved lumbar stability and mobility. She required minimal cueing with these new interventions for proper biomechanics to facilitate proper muscular engagement. Manual therapy focused on lumbar joint mobilizations as this was moderately effective at reducing her familiar symptoms. She reported feeling a little better upon the conclusion of treatment. She continues to require skilled physical therapy to address her remaining impairments to return to her  prior level of function.   ? Personal Factors and Comorbidities Time since onset of injury/illness/exacerbation;Comorbidity 3+   ? Comorbidities HTN, DM, osteopenia, history of CVA   ?  Examination-Activity Limitations Locomotion Level;Sit;Sleep;Stand;Lift   ? Examination-Participation Restrictions Church;Cleaning;Community Activity;Shop   ? Stability/Clinical Decision Making Evolving/Moderate complexity   ? Rehab Potential Good   ? PT Frequency 2x / week   ? PT Duration 6 weeks   ? PT Treatment/Interventions ADLs/Self Care Home Management;Electrical Stimulation;Cryotherapy;Moist Heat;Traction;Ultrasound;Neuromuscular re-education;Balance training;Therapeutic exercise;Therapeutic activities;Functional mobility training;Gait training;Stair training;Patient/family education;Manual techniques;Passive range of motion;Taping   ? PT Next Visit Plan nustep, lower extremity strengthening and balance interventions, and modalities as needed   ? PT Home Exercise Plan Access Code: [email or web address]  URL: [email or web address]  Date: 12/12/2021  Prepared by: Jacqulynn Cadet    Exercises  - Lower Trunk Rotations  - 2 x daily - 7 x weekly - 3 sets - 10 reps  - Seated Hip Adduction Isometrics with Ball  - 1 x daily - 7 x weekly - 2 sets - 10 reps - 3-5 seconds  hold   ? Consulted and Agree with Plan of Care Patient   ? ?  ?  ? ?  ? ? ?Patient will benefit from skilled therapeutic intervention in order to improve the following deficits and impairments:  Difficulty walking, Decreased range of motion, Impaired tone, Decreased activity tolerance, Pain, Decreased balance, Hypomobility, Decreased strength, Decreased mobility ? ?Visit Diagnosis: ?Other low back pain ? ?Pain in left hip ? ?Muscle weakness (generalized) ? ? ? ? ?Problem List ?Patient Active Problem List  ? Diagnosis Date Noted  ? Crohn's disease of both small and large intestine without complication ([place]) 69/67/8938  ? Loose stools 06/07/2021  ? Bilateral carpal tunnel  syndrome 10/17/2020  ? Obesity (BMI 30-39.9) 05/08/2017  ? Solitary pulmonary nodule 05/08/2017  ? GERD (gastroesophageal reflux disease) 05/02/2017  ? Type 2 diabetes mellitus with other specified complication

## 2021-12-24 ENCOUNTER — Ambulatory Visit: Payer: Medicare PPO

## 2021-12-24 DIAGNOSIS — M5459 Other low back pain: Secondary | ICD-10-CM | POA: Diagnosis not present

## 2021-12-24 DIAGNOSIS — M6281 Muscle weakness (generalized): Secondary | ICD-10-CM

## 2021-12-24 DIAGNOSIS — M25552 Pain in left hip: Secondary | ICD-10-CM

## 2021-12-24 NOTE — Therapy (Signed)
[place] ?[facility] ?[address] ?[place], [place], [address] ?Phone: [phone]   Fax:  [phone] ? ?Physical Therapy Treatment ? ?Patient Details  ?Name: Felicia Pugh ?MRN: [id number] ?Date of Birth: February 14, 1946 ?Referring Provider (PT): [place], PA-C ? ? ?Encounter Date: 12/24/2021 ? ? PT End of Session - 12/24/21 1306   ? ? Visit Number 4   ? Number of Visits 12   ? Date for PT Re-Evaluation 03/08/22   ? PT Start Time 1300   ? PT Stop Time 1350   ? PT Time Calculation (min) 50 min   ? Activity Tolerance Patient tolerated treatment well   ? Behavior During Therapy [facility] for tasks assessed/performed   ? ?  ?  ? ?  ? ? ?Past Medical History:  ?Diagnosis Date  ? Anemia   ? H/O myelodysplastic anemis? used to see Dr. Sonny Dandy, no longer seeing anyone (02/2015)  ? Cataract   ? Complication of anesthesia   ? difficulty breathing after waking up from anesthesia  ? Crohn's disease ([place]) 2016  ? diagnosed in 2016 and started on Colazal, but inflammation/ulceration in ileum/cecum dates back to 2006.  ? Diabetes mellitus without complication ([place])   ? Diabetic neuropathy ([place])   ? Hyperlipidemia   ? Hypertension   ? IBS (irritable bowel syndrome)   ? Stroke ([place]) 08/12/2009  ? Syncope and collapse   ? Thyroid nodule   ? Ulcer   ? ? ?Past Surgical History:  ?Procedure Laterality Date  ? ABDOMINAL HYSTERECTOMY    ? partial  ? BACK SURGERY    ? x3 (Dr Loyola Mast, Dr Patrice Paradise)  ? BREAST SURGERY Right 2002  ? cyst removed  ? COLONOSCOPY  2006  ? RMR: Normal colon except for ileocecal valve, eroded/ulcerated areas at ileocecal valve with mucosal hemorrhage present status post biopsy. ulcer (ileocecal valve, biopsy): Chronic active inflammation with ulceration, nonspecific.  ? COLONOSCOPY  09/06/2009  ? RMR: Suboptimal prep on the right side. Ulcers noted at IC valve, base of cecum, and scattered through food part of ascending colon. Pathology wiht chronic active colitis, question Crohn's  ? COLONOSCOPY  N/A 03/27/2015  ? Dr. Gala Romney: abnormal cecum and IC valve most consistent with IBD. TI intubated and appeared normal. Likely Crohn's disease. Patient denies NSAID use. No improvement with Lialda samples at time of initial diagnosis  ? COLONOSCOPY N/A 05/15/2017  ? Dr. Gala Romney: ab normal IC valve, s/p biopsy with active ileitis, diverticulosis in sigmoid and descending colon. Non-bleeding internal hemorrhoids  ? ESOPHAGOGASTRODUODENOSCOPY    ? HAND SURGERY Right   ? Carpel tunnel and right elbow  ? LAPAROSCOPIC APPENDECTOMY N/A 07/21/2016  ? Procedure: APPENDECTOMY LAPAROSCOPIC;  Surgeon: Aviva Signs, MD;  Location: [facility];  Service: General;  Laterality: N/A;  ? Small bowel capsule endoscopy  06/10/2005  ? RMR: single erosion versus AVM, distal ileum  ? Small bowel capsule endoscopy  prior to 05/2005  ? [place]: reported erosions/ulcerations per medical record. actual report unavailable.  ? ? ?There were no vitals filed for this visit. ? ? Subjective Assessment - 12/24/21 1305   ? ? Subjective Pt arrives for today's treatment session denying any hip pain, but reporting 6/10 middle low back pain.   ? Pertinent History multiple lumbar surgeries, CVA in 2010   ? Limitations Sitting;Standing;Walking;House hold activities   ? How long can you sit comfortably? about 30 minutes at most (limited by left hip)   ? How long can you walk comfortably? about 45  minutes (limited by low back)   ? Patient Stated Goals reduced pain, sleep longer (less than 3 hours prior to the injection), sit and stand longer   ? Currently in Pain? Yes   ? Pain Score 6    ? Pain Location Back   ? Pain Orientation Mid;Lower   ? Pain Onset More than a month ago   ? Pain Onset More than a month ago   ? ?  ?  ? ?  ? ? ? ? ? ? ? ? ? ? ? ? ? ? ? ? ? ? ? ? [facility] Adult PT Treatment/Exercise - 12/24/21 0001   ? ?  ? Exercises  ? Exercises Knee/Hip;Lumbar   ?  ? Lumbar Exercises: Machines for Strengthening  ? Cybex Lumbar Extension 50# x 20 reps   ? Cybex Knee  Extension 10# x 20 reps   ? Cybex Knee Flexion 30# x 20 reps   ? Other Lumbar Machine Exercise Lumbar Flexion 50# x 20 reps   ?  ? Lumbar Exercises: Standing  ? Row Both;20 reps;Theraband   ? Theraband Level (Row) Level 4 (Blue)   XTS  ? Other Standing Lumbar Exercises Hip Abduction and Extension x 20 reps   ?  ? Knee/Hip Exercises: Aerobic  ? Nustep Lvl 4 x 15 mins   ?  ? Modalities  ? Modalities Electrical Stimulation;Moist Heat   ?  ? Moist Heat Therapy  ? Number Minutes Moist Heat 15 Minutes   ? Moist Heat Location Lumbar Spine   ?  ? Electrical Stimulation  ? Electrical Stimulation Location bilateral lumbar paraspinals   ? Electrical Stimulation Action IFC   ? Electrical Stimulation Parameters 80-150 Hz x 15 mins   ? Electrical Stimulation Goals Pain;Tone   ? ?  ?  ? ?  ? ? ? ? ? ? ? ? ? ? ? ? ? ? ? PT Long Term Goals - 12/12/21 1505   ? ?  ? PT LONG TERM GOAL #1  ? Title Patient will be independent with her HEP.   ? Time 6   ? Period Weeks   ? Status New   ? Target Date 01/16/22   ?  ? PT LONG TERM GOAL #2  ? Title Patient will report being able to sleep at least 5 hours without being awakened by her familiar low back and hip pain.   ? Time 6   ? Period Weeks   ? Status New   ? Target Date 01/16/22   ?  ? PT LONG TERM GOAL #3  ? Title Patient will be able to complete her daily activities without her familair pain exceeding a 6/10.   ? Time 6   ? Period Weeks   ? Status New   ? Target Date 01/16/22   ? ?  ?  ? ?  ? ? ? ? ? ? ? ? Plan - 12/24/21 1306   ? ? Clinical Impression Statement Pt arrives for today's treatment session denying any hip pain, but endorses 6/10 low back pain. Pt introduced to cybex machinery today to increase strength, function, and ROM in low back and B hips.  Pt requiring min cues for compensatory leaning during standing exercises.  Normal responses to estim and moist heat upon removal.  Pt reported 4/10 low back pain at completion of today's treatment session.   ? Personal Factors and  Comorbidities Time since onset of injury/illness/exacerbation;Comorbidity 3+   ? Comorbidities HTN,  DM, osteopenia, history of CVA   ? Examination-Activity Limitations Locomotion Level;Sit;Sleep;Stand;Lift   ? Examination-Participation Restrictions Church;Cleaning;Community Activity;Shop   ? Stability/Clinical Decision Making Evolving/Moderate complexity   ? Rehab Potential Good   ? PT Frequency 2x / week   ? PT Duration 6 weeks   ? PT Treatment/Interventions ADLs/Self Care Home Management;Electrical Stimulation;Cryotherapy;Moist Heat;Traction;Ultrasound;Neuromuscular re-education;Balance training;Therapeutic exercise;Therapeutic activities;Functional mobility training;Gait training;Stair training;Patient/family education;Manual techniques;Passive range of motion;Taping   ? PT Next Visit Plan nustep, lower extremity strengthening and balance interventions, and modalities as needed   ? PT Home Exercise Plan Access Code: [email or web address]  URL: [email or web address]  Date: 12/12/2021  Prepared by: Jacqulynn Cadet    Exercises  - Lower Trunk Rotations  - 2 x daily - 7 x weekly - 3 sets - 10 reps  - Seated Hip Adduction Isometrics with Ball  - 1 x daily - 7 x weekly - 2 sets - 10 reps - 3-5 seconds  hold   ? Consulted and Agree with Plan of Care Patient   ? ?  ?  ? ?  ? ? ?Patient will benefit from skilled therapeutic intervention in order to improve the following deficits and impairments:  Difficulty walking, Decreased range of motion, Impaired tone, Decreased activity tolerance, Pain, Decreased balance, Hypomobility, Decreased strength, Decreased mobility ? ?Visit Diagnosis: ?Other low back pain ? ?Pain in left hip ? ?Muscle weakness (generalized) ? ? ? ? ?Problem List ?Patient Active Problem List  ? Diagnosis Date Noted  ? Crohn's disease of both small and large intestine without complication ([place]) 57/84/6962  ? Loose stools 06/07/2021  ? Bilateral carpal tunnel syndrome 10/17/2020  ? Obesity (BMI 30-39.9)  05/08/2017  ? Solitary pulmonary nodule 05/08/2017  ? GERD (gastroesophageal reflux disease) 05/02/2017  ? Type 2 diabetes mellitus with other specified complication ([place]) 95/28/4132  ? Crohn's disease with complicati

## 2021-12-27 ENCOUNTER — Ambulatory Visit: Payer: Medicare PPO

## 2021-12-27 DIAGNOSIS — M5459 Other low back pain: Secondary | ICD-10-CM | POA: Diagnosis not present

## 2021-12-27 DIAGNOSIS — M25552 Pain in left hip: Secondary | ICD-10-CM

## 2021-12-27 DIAGNOSIS — M6281 Muscle weakness (generalized): Secondary | ICD-10-CM

## 2021-12-27 NOTE — Therapy (Signed)
[facility] [place], [place], [address] Phone: [phone]   Fax:  [phone]  Physical Therapy Treatment  Patient Details  Name: Felicia Pugh MRN: [id number] Date of Birth: May 06, 1946 Referring Provider (PT): [place], [place]   Encounter Date: 12/27/2021   PT End of Session - 12/27/21 1350     Visit Number 5    Number of Visits 12    Date for PT Re-Evaluation 03/08/22    PT Start Time 2671    PT Stop Time 1443    PT Time Calculation (min) 58 min    Activity Tolerance Patient tolerated treatment well    Behavior During Therapy [facility] for tasks assessed/performed             Past Medical History:  Diagnosis Date   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 Sonny Dandy, no longer seeing anyone (02/2015)   Cataract    Complication of anesthesia    difficulty breathing after waking up from anesthesia   Crohn's disease ([place]) 2016   diagnosed in 2016 and started on Colazal, but inflammation/ulceration in ileum/cecum dates back to 2006.   Diabetes mellitus without complication ([place])    Diabetic neuropathy ([place])    Hyperlipidemia    Hypertension    IBS (irritable bowel syndrome)    Stroke ([place]) 08/12/2009   Syncope and collapse    Thyroid nodule    Ulcer     Past Surgical History:  Procedure Laterality Date   ABDOMINAL HYSTERECTOMY     partial   BACK SURGERY     x3 (Dr Loyola Mast, Dr Patrice Paradise)   BREAST SURGERY Right 2002   cyst removed   COLONOSCOPY  2006   RMR: Normal colon except for ileocecal valve, eroded/ulcerated areas at ileocecal valve with mucosal hemorrhage present status post biopsy. ulcer (ileocecal valve, biopsy): Chronic active inflammation with ulceration, nonspecific.   COLONOSCOPY  09/06/2009   RMR: Suboptimal prep on the right side. Ulcers noted at IC valve, base of cecum, and scattered through food part of ascending colon. Pathology wiht chronic active colitis, question Crohn's   COLONOSCOPY  N/A 03/27/2015   Dr. Gala Romney: abnormal cecum and IC valve most consistent with IBD. TI intubated and appeared normal. Likely Crohn's disease. Patient denies NSAID use. No improvement with Lialda samples at time of initial diagnosis   COLONOSCOPY N/A 05/15/2017   Dr. Gala Romney: ab normal IC valve, s/p biopsy with active ileitis, diverticulosis in sigmoid and descending colon. Non-bleeding internal hemorrhoids   ESOPHAGOGASTRODUODENOSCOPY     HAND SURGERY Right    Carpel tunnel and right elbow   LAPAROSCOPIC APPENDECTOMY N/A 07/21/2016   Procedure: APPENDECTOMY LAPAROSCOPIC;  Surgeon: Aviva Signs, MD;  Location: [facility];  Service: General;  Laterality: N/A;   Small bowel capsule endoscopy  06/10/2005   RMR: single erosion versus AVM, distal ileum   Small bowel capsule endoscopy  prior to 05/2005   [place]: reported erosions/ulcerations per medical record. actual report unavailable.    There were no vitals filed for this visit.   Subjective Assessment - 12/27/21 1349     Subjective Pt arrives for today's treatment session reporting 6/10 mid low back pain.  Pt states she felt "really good" after last treatment session until the next morning.    Pertinent History multiple lumbar surgeries, CVA in 2010    Limitations Sitting;Standing;Walking;House hold activities    How long can you sit comfortably? about 30 minutes at most (limited by left hip)  How long can you walk comfortably? about 45 minutes (limited by low back)    Patient Stated Goals reduced pain, sleep longer (less than 3 hours prior to the injection), sit and stand longer    Currently in Pain? Yes    Pain Score 6     Pain Location Back    Pain Orientation Mid;Lower    Pain Onset More than a month ago    Pain Onset More than a month ago                               [facility] Adult PT Treatment/Exercise - 12/27/21 0001       Lumbar Exercises: Machines for Strengthening   Cybex Lumbar Extension 50# x 20 reps    Cybex  Knee Extension 10# x 20 reps    Cybex Knee Flexion 30# x 20 reps    Other Lumbar Machine Exercise Lumbar Flexion 50# x 20 reps      Knee/Hip Exercises: Aerobic   Nustep Lvl 4 x 15 mins      Modalities   Modalities Electrical Stimulation;Moist Heat      Moist Heat Therapy   Number Minutes Moist Heat 15 Minutes    Moist Heat Location Lumbar Spine      Electrical Stimulation   Electrical Stimulation Location Right lumbar paraspinals    Electrical Stimulation Action IFC    Electrical Stimulation Parameters 80-150 Hz x 15 mins    Electrical Stimulation Goals Pain;Tone      Manual Therapy   Manual Therapy Soft tissue mobilization    Soft tissue mobilization STW/M to right lumbar paraspinals and upper glute to decrease pain and tone with pt in left side-lying                          PT Long Term Goals - 12/12/21 1505       PT LONG TERM GOAL #1   Title Patient will be independent with her HEP.    Time 6    Period Weeks    Status New    Target Date 01/16/22      PT LONG TERM GOAL #2   Title Patient will report being able to sleep at least 5 hours without being awakened by her familiar low back and hip pain.    Time 6    Period Weeks    Status New    Target Date 01/16/22      PT LONG TERM GOAL #3   Title Patient will be able to complete her daily activities without her familair pain exceeding a 6/10.    Time 6    Period Weeks    Status New    Target Date 01/16/22                   Plan - 12/27/21 1350     Clinical Impression Statement Pt arrives for today's treatment session reporting 6/10 right/middle low back pain.  Pt requiring min cues for eccentric control during cybex knee flexion and extension.  STW/M performed to right paraspinals and right upper glute to decrease pain and tone with pt positioned in left side-lying.  Normal responses to estim and MH noted upon removal.  Pt reported 4/10 low back pain upon completion of today's treatment  session.    Personal Factors and Comorbidities Time since onset of injury/illness/exacerbation;Comorbidity 3+    Comorbidities HTN,  DM, osteopenia, history of CVA    Examination-Activity Limitations Locomotion Level;Sit;Sleep;Stand;Lift    Examination-Participation Restrictions Church;Cleaning;Community Activity;Shop    Stability/Clinical Decision Making Evolving/Moderate complexity    Rehab Potential Good    PT Frequency 2x / week    PT Duration 6 weeks    PT Treatment/Interventions ADLs/Self Care Home Management;Electrical Stimulation;Cryotherapy;Moist Heat;Traction;Ultrasound;Neuromuscular re-education;Balance training;Therapeutic exercise;Therapeutic activities;Functional mobility training;Gait training;Stair training;Patient/family education;Manual techniques;Passive range of motion;Taping    PT Next Visit Plan nustep, lower extremity strengthening and balance interventions, and modalities as needed    PT Home Exercise Plan Access Code: [email or web address]  URL: [email or web address]  Date: 12/12/2021  Prepared by: Jacqulynn Cadet    Exercises  - Lower Trunk Rotations  - 2 x daily - 7 x weekly - 3 sets - 10 reps  - Seated Hip Adduction Isometrics with Ball  - 1 x daily - 7 x weekly - 2 sets - 10 reps - 3-5 seconds  hold    Consulted and Agree with Plan of Care Patient             Patient will benefit from skilled therapeutic intervention in order to improve the following deficits and impairments:  Difficulty walking, Decreased range of motion, Impaired tone, Decreased activity tolerance, Pain, Decreased balance, Hypomobility, Decreased strength, Decreased mobility  Visit Diagnosis: Other low back pain  Pain in left hip  Muscle weakness (generalized)     Problem List Patient Active Problem List   Diagnosis Date Noted   Crohn's disease of both small and large intestine without complication ([place]) 88/50/2774   Loose stools 06/07/2021   Bilateral carpal tunnel syndrome  10/17/2020   Obesity (BMI 30-39.9) 05/08/2017   Solitary pulmonary nodule 05/08/2017   GERD (gastroesophageal reflux disease) 05/02/2017   Type 2 diabetes mellitus with other specified complication ([place]) 12/87/8676   Crohn's disease with complication ([place]) 72/04/4708   Diverticulosis of colon without hemorrhage    History of stroke 07/08/2014   Osteopenia 05/11/2014   Hyperlipidemia associated with type 2 diabetes mellitus ([place]) 06/01/2008   Anemia, chronic disease 06/01/2008   Hypertension associated with diabetes ([place]) 06/01/2008    Kathrynn Ducking, PTA 12/27/2021, 2:47 PM  [facility] [address] [place], [place], [address] Phone: [phone]   Fax:  [phone]  Name: Felicia Pugh MRN: [id number] Date of Birth: 1945-09-16

## 2021-12-31 ENCOUNTER — Ambulatory Visit: Payer: Medicare PPO

## 2021-12-31 DIAGNOSIS — M5459 Other low back pain: Secondary | ICD-10-CM | POA: Diagnosis not present

## 2021-12-31 DIAGNOSIS — M6281 Muscle weakness (generalized): Secondary | ICD-10-CM

## 2021-12-31 DIAGNOSIS — M25552 Pain in left hip: Secondary | ICD-10-CM

## 2021-12-31 NOTE — Therapy (Signed)
[facility] [place], [place], [address] Phone: [phone]   Fax:  [phone]  Physical Therapy Treatment  Patient Details  Name: Felicia Pugh MRN: [id number] Date of Birth: 1946/01/20 Referring Provider (PT): [place], [place]   Encounter Date: 12/31/2021   PT End of Session - 12/31/21 1432     Visit Number 6    Number of Visits 12    Date for PT Re-Evaluation 03/08/22    PT Start Time 1430    PT Stop Time 1525    PT Time Calculation (min) 55 min    Activity Tolerance Patient tolerated treatment well    Behavior During Therapy [facility] for tasks assessed/performed             Past Medical History:  Diagnosis Date   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 Sonny Dandy, no longer seeing anyone (02/2015)   Cataract    Complication of anesthesia    difficulty breathing after waking up from anesthesia   Crohn's disease ([place]) 2016   diagnosed in 2016 and started on Colazal, but inflammation/ulceration in ileum/cecum dates back to 2006.   Diabetes mellitus without complication ([place])    Diabetic neuropathy ([place])    Hyperlipidemia    Hypertension    IBS (irritable bowel syndrome)    Stroke ([place]) 08/12/2009   Syncope and collapse    Thyroid nodule    Ulcer     Past Surgical History:  Procedure Laterality Date   ABDOMINAL HYSTERECTOMY     partial   BACK SURGERY     x3 (Dr Loyola Mast, Dr Patrice Paradise)   BREAST SURGERY Right 2002   cyst removed   COLONOSCOPY  2006   RMR: Normal colon except for ileocecal valve, eroded/ulcerated areas at ileocecal valve with mucosal hemorrhage present status post biopsy. ulcer (ileocecal valve, biopsy): Chronic active inflammation with ulceration, nonspecific.   COLONOSCOPY  09/06/2009   RMR: Suboptimal prep on the right side. Ulcers noted at IC valve, base of cecum, and scattered through food part of ascending colon. Pathology wiht chronic active colitis, question Crohn's   COLONOSCOPY  N/A 03/27/2015   Dr. Gala Romney: abnormal cecum and IC valve most consistent with IBD. TI intubated and appeared normal. Likely Crohn's disease. Patient denies NSAID use. No improvement with Lialda samples at time of initial diagnosis   COLONOSCOPY N/A 05/15/2017   Dr. Gala Romney: ab normal IC valve, s/p biopsy with active ileitis, diverticulosis in sigmoid and descending colon. Non-bleeding internal hemorrhoids   ESOPHAGOGASTRODUODENOSCOPY     HAND SURGERY Right    Carpel tunnel and right elbow   LAPAROSCOPIC APPENDECTOMY N/A 07/21/2016   Procedure: APPENDECTOMY LAPAROSCOPIC;  Surgeon: Aviva Signs, MD;  Location: [facility];  Service: General;  Laterality: N/A;   Small bowel capsule endoscopy  06/10/2005   RMR: single erosion versus AVM, distal ileum   Small bowel capsule endoscopy  prior to 05/2005   [place]: reported erosions/ulcerations per medical record. actual report unavailable.    There were no vitals filed for this visit.   Subjective Assessment - 12/31/21 1431     Subjective Pt arrives for today's treatment session reporting 3/10 mid low back pain.    Pertinent History multiple lumbar surgeries, CVA in 2010    Limitations Sitting;Standing;Walking;House hold activities    How long can you sit comfortably? about 30 minutes at most (limited by left hip)    How long can you walk comfortably? about 45 minutes (limited by low back)  Patient Stated Goals reduced pain, sleep longer (less than 3 hours prior to the injection), sit and stand longer    Currently in Pain? Yes    Pain Score 3     Pain Location Back    Pain Orientation Mid;Lower    Pain Onset More than a month ago    Pain Onset More than a month ago                               [facility] Adult PT Treatment/Exercise - 12/31/21 0001       Lumbar Exercises: Machines for Strengthening   Cybex Lumbar Extension 60# x 30 reps    Cybex Knee Extension 10# x 30 reps    Cybex Knee Flexion 30# x 30 reps    Other Lumbar  Machine Exercise Lumbar Flexion 60# x 30 reps      Knee/Hip Exercises: Aerobic   Nustep Lvl 4 x 15 mins      Knee/Hip Exercises: Machines for Strengthening   Cybex Knee Extension --    Cybex Knee Flexion --    Cybex Leg Press --      Modalities   Modalities Electrical Stimulation;Moist Heat      Moist Heat Therapy   Number Minutes Moist Heat 15 Minutes    Moist Heat Location Lumbar Spine      Electrical Stimulation   Electrical Stimulation Location Right lumbar paraspinals    Electrical Stimulation Action IFC    Electrical Stimulation Parameters 80-150 Hz x 15 mins    Electrical Stimulation Goals Pain;Tone      Manual Therapy   Manual Therapy Soft tissue mobilization    Soft tissue mobilization STW/M to right lumbar paraspinals and upper glute to decrease pain and tone with pt in left side-lying                          PT Long Term Goals - 12/12/21 1505       PT LONG TERM GOAL #1   Title Patient will be independent with her HEP.    Time 6    Period Weeks    Status New    Target Date 01/16/22      PT LONG TERM GOAL #2   Title Patient will report being able to sleep at least 5 hours without being awakened by her familiar low back and hip pain.    Time 6    Period Weeks    Status New    Target Date 01/16/22      PT LONG TERM GOAL #3   Title Patient will be able to complete her daily activities without her familair pain exceeding a 6/10.    Time 6    Period Weeks    Status New    Target Date 01/16/22                   Plan - 12/31/21 1432     Clinical Impression Statement Pt arrives for today's treatment session reporting 3/10 middle low back pain.  Pt able to tolerate increased reps with all cybex lower extremity exercises with min cues for eccentric control.  STW/M performed to B lumbar paraspinals to decrease pain and tone.  Normal responses to estim and MH noted upon removal.  Pt reported 1/10 low back pain at completion of today's  treamtent session.    Personal Factors and Comorbidities Time  since onset of injury/illness/exacerbation;Comorbidity 3+    Comorbidities HTN, DM, osteopenia, history of CVA    Examination-Activity Limitations Locomotion Level;Sit;Sleep;Stand;Lift    Examination-Participation Restrictions Church;Cleaning;Community Activity;Shop    Stability/Clinical Decision Making Evolving/Moderate complexity    Rehab Potential Good    PT Frequency 2x / week    PT Duration 6 weeks    PT Treatment/Interventions ADLs/Self Care Home Management;Electrical Stimulation;Cryotherapy;Moist Heat;Traction;Ultrasound;Neuromuscular re-education;Balance training;Therapeutic exercise;Therapeutic activities;Functional mobility training;Gait training;Stair training;Patient/family education;Manual techniques;Passive range of motion;Taping    PT Next Visit Plan nustep, lower extremity strengthening and balance interventions, and modalities as needed    PT Home Exercise Plan Access Code: [email or web address]  URL: [email or web address]  Date: 12/12/2021  Prepared by: Jacqulynn Cadet    Exercises  - Lower Trunk Rotations  - 2 x daily - 7 x weekly - 3 sets - 10 reps  - Seated Hip Adduction Isometrics with Ball  - 1 x daily - 7 x weekly - 2 sets - 10 reps - 3-5 seconds  hold    Consulted and Agree with Plan of Care Patient             Patient will benefit from skilled therapeutic intervention in order to improve the following deficits and impairments:  Difficulty walking, Decreased range of motion, Impaired tone, Decreased activity tolerance, Pain, Decreased balance, Hypomobility, Decreased strength, Decreased mobility  Visit Diagnosis: Other low back pain  Pain in left hip  Muscle weakness (generalized)     Problem List Patient Active Problem List   Diagnosis Date Noted   Crohn's disease of both small and large intestine without complication ([place]) 10/04/3610   Loose stools 06/07/2021   Bilateral carpal tunnel syndrome  10/17/2020   Obesity (BMI 30-39.9) 05/08/2017   Solitary pulmonary nodule 05/08/2017   GERD (gastroesophageal reflux disease) 05/02/2017   Type 2 diabetes mellitus with other specified complication ([place]) 24/49/7530   Crohn's disease with complication ([place]) 12/20/209   Diverticulosis of colon without hemorrhage    History of stroke 07/08/2014   Osteopenia 05/11/2014   Hyperlipidemia associated with type 2 diabetes mellitus ([place]) 06/01/2008   Anemia, chronic disease 06/01/2008   Hypertension associated with diabetes ([place]) 06/01/2008    Kathrynn Ducking, PTA 12/31/2021, 3:30 PM  [facility] [address] [place], [place], [address] Phone: [phone]   Fax:  [phone]  Name: KOREY ARROYO MRN: [id number] Date of Birth: [birth year]

## 2022-01-03 ENCOUNTER — Ambulatory Visit: Payer: Medicare PPO

## 2022-01-03 DIAGNOSIS — M5459 Other low back pain: Secondary | ICD-10-CM

## 2022-01-03 DIAGNOSIS — M6281 Muscle weakness (generalized): Secondary | ICD-10-CM

## 2022-01-03 DIAGNOSIS — M25552 Pain in left hip: Secondary | ICD-10-CM

## 2022-01-03 NOTE — Therapy (Signed)
[facility] [place], [place], [address] Phone: [phone]   Fax:  [phone]  Physical Therapy Treatment  Patient Details  Name: Felicia Pugh MRN: [id number] Date of Birth: 16-Nov-1945 Referring Provider (PT): [place], [place]   Encounter Date: 01/03/2022   PT End of Session - 01/03/22 1305     Visit Number 7    Number of Visits 12    Date for PT Re-Evaluation 03/08/22    PT Start Time 1300    PT Stop Time 1962    PT Time Calculation (min) 54 min    Activity Tolerance Patient tolerated treatment well    Behavior During Therapy [facility] for tasks assessed/performed             Past Medical History:  Diagnosis Date   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 Sonny Dandy, no longer seeing anyone (02/2015)   Cataract    Complication of anesthesia    difficulty breathing after waking up from anesthesia   Crohn's disease ([place]) 2016   diagnosed in 2016 and started on Colazal, but inflammation/ulceration in ileum/cecum dates back to 2006.   Diabetes mellitus without complication ([place])    Diabetic neuropathy ([place])    Hyperlipidemia    Hypertension    IBS (irritable bowel syndrome)    Stroke ([place]) 08/12/2009   Syncope and collapse    Thyroid nodule    Ulcer     Past Surgical History:  Procedure Laterality Date   ABDOMINAL HYSTERECTOMY     partial   BACK SURGERY     x3 (Dr Loyola Mast, Dr Patrice Paradise)   BREAST SURGERY Right 2002   cyst removed   COLONOSCOPY  2006   RMR: Normal colon except for ileocecal valve, eroded/ulcerated areas at ileocecal valve with mucosal hemorrhage present status post biopsy. ulcer (ileocecal valve, biopsy): Chronic active inflammation with ulceration, nonspecific.   COLONOSCOPY  09/06/2009   RMR: Suboptimal prep on the right side. Ulcers noted at IC valve, base of cecum, and scattered through food part of ascending colon. Pathology wiht chronic active colitis, question Crohn's   COLONOSCOPY  N/A 03/27/2015   Dr. Gala Romney: abnormal cecum and IC valve most consistent with IBD. TI intubated and appeared normal. Likely Crohn's disease. Patient denies NSAID use. No improvement with Lialda samples at time of initial diagnosis   COLONOSCOPY N/A 05/15/2017   Dr. Gala Romney: ab normal IC valve, s/p biopsy with active ileitis, diverticulosis in sigmoid and descending colon. Non-bleeding internal hemorrhoids   ESOPHAGOGASTRODUODENOSCOPY     HAND SURGERY Right    Carpel tunnel and right elbow   LAPAROSCOPIC APPENDECTOMY N/A 07/21/2016   Procedure: APPENDECTOMY LAPAROSCOPIC;  Surgeon: Aviva Signs, MD;  Location: [facility];  Service: General;  Laterality: N/A;   Small bowel capsule endoscopy  06/10/2005   RMR: single erosion versus AVM, distal ileum   Small bowel capsule endoscopy  prior to 05/2005   [place]: reported erosions/ulcerations per medical record. actual report unavailable.    There were no vitals filed for this visit.   Subjective Assessment - 01/03/22 1305     Subjective Pt arrives for today's treatment session reporting 4/10 left low back pain.    Pertinent History multiple lumbar surgeries, CVA in 2010    Limitations Sitting;Standing;Walking;House hold activities    How long can you sit comfortably? about 30 minutes at most (limited by left hip)    How long can you walk comfortably? about 45 minutes (limited by low back)  Patient Stated Goals reduced pain, sleep longer (less than 3 hours prior to the injection), sit and stand longer    Currently in Pain? Yes    Pain Score 4     Pain Location Back    Pain Orientation Left    Pain Onset More than a month ago    Pain Onset More than a month ago                               [facility] Adult PT Treatment/Exercise - 01/03/22 0001       Lumbar Exercises: Machines for Strengthening   Cybex Lumbar Extension 70# x 20 reps    Cybex Knee Extension 20# x 20 reps    Cybex Knee Flexion 40# x 20 reps    Other Lumbar Machine  Exercise Lumbar Flexion 70# x 20 reps      Knee/Hip Exercises: Aerobic   Nustep Lvl 4 x 15 mins      Modalities   Modalities Electrical Stimulation;Moist Heat      Moist Heat Therapy   Number Minutes Moist Heat 15 Minutes    Moist Heat Location Lumbar Spine      Electrical Stimulation   Electrical Stimulation Location Left lumbar spine    Electrical Stimulation Action IFC    Electrical Stimulation Parameters 80-150 Hz x 15 mins    Electrical Stimulation Goals Pain;Tone      Manual Therapy   Manual Therapy Soft tissue mobilization    Soft tissue mobilization STW/M to left lumbar paraspinals with pt in right side-lying to decrease pain and tone                          PT Long Term Goals - 12/12/21 1505       PT LONG TERM GOAL #1   Title Patient will be independent with her HEP.    Time 6    Period Weeks    Status New    Target Date 01/16/22      PT LONG TERM GOAL #2   Title Patient will report being able to sleep at least 5 hours without being awakened by her familiar low back and hip pain.    Time 6    Period Weeks    Status New    Target Date 01/16/22      PT LONG TERM GOAL #3   Title Patient will be able to complete her daily activities without her familair pain exceeding a 6/10.    Time 6    Period Weeks    Status New    Target Date 01/16/22                   Plan - 01/03/22 1306     Clinical Impression Statement Pt arrives for today's treatment session reporting 4/10 left low back pain.  Pt states that she did some work at [employer] yesterday and attributes pain to that.  Pt able to tolerate increased weight with all cybex exercises today with min cues for eccentric control.  STW/M performed to left lumbar paraspinals with pt in right side-lying to decrease pain and tone.  Normal responses to estim and MH noted upon removal.  Pt reported 1/10 left low back pain at completion of treatment session.    Personal Factors and Comorbidities  Time since onset of injury/illness/exacerbation;Comorbidity 3+    Comorbidities HTN, DM, osteopenia,  history of CVA    Examination-Activity Limitations Locomotion Level;Sit;Sleep;Stand;Lift    Examination-Participation Restrictions Church;Cleaning;Community Activity;Shop    Stability/Clinical Decision Making Evolving/Moderate complexity    Rehab Potential Good    PT Frequency 2x / week    PT Duration 6 weeks    PT Treatment/Interventions ADLs/Self Care Home Management;Electrical Stimulation;Cryotherapy;Moist Heat;Traction;Ultrasound;Neuromuscular re-education;Balance training;Therapeutic exercise;Therapeutic activities;Functional mobility training;Gait training;Stair training;Patient/family education;Manual techniques;Passive range of motion;Taping    PT Next Visit Plan nustep, lower extremity strengthening and balance interventions, and modalities as needed    PT Home Exercise Plan Access Code: [email or web address]  URL: [email or web address]  Date: 12/12/2021  Prepared by: Jacqulynn Cadet    Exercises  - Lower Trunk Rotations  - 2 x daily - 7 x weekly - 3 sets - 10 reps  - Seated Hip Adduction Isometrics with Ball  - 1 x daily - 7 x weekly - 2 sets - 10 reps - 3-5 seconds  hold    Consulted and Agree with Plan of Care Patient             Patient will benefit from skilled therapeutic intervention in order to improve the following deficits and impairments:  Difficulty walking, Decreased range of motion, Impaired tone, Decreased activity tolerance, Pain, Decreased balance, Hypomobility, Decreased strength, Decreased mobility  Visit Diagnosis: Other low back pain  Pain in left hip  Muscle weakness (generalized)     Problem List Patient Active Problem List   Diagnosis Date Noted   Crohn's disease of both small and large intestine without complication ([place]) 67/61/9509   Loose stools 06/07/2021   Bilateral carpal tunnel syndrome 10/17/2020   Obesity (BMI 30-39.9) 05/08/2017    Solitary pulmonary nodule 05/08/2017   GERD (gastroesophageal reflux disease) 05/02/2017   Type 2 diabetes mellitus with other specified complication ([place]) 32/67/1245   Crohn's disease with complication ([place]) 80/99/8338   Diverticulosis of colon without hemorrhage    History of stroke 07/08/2014   Osteopenia 05/11/2014   Hyperlipidemia associated with type 2 diabetes mellitus ([place]) 06/01/2008   Anemia, chronic disease 06/01/2008   Hypertension associated with diabetes ([place]) 06/01/2008    Kathrynn Ducking, PTA 01/03/2022, 1:56 PM  [facility] [address] [place], [place], [address] Phone: [phone]   Fax:  [phone]  Name: Felicia Pugh MRN: [id number] Date of Birth: April 04, 1946

## 2022-01-10 ENCOUNTER — Ambulatory Visit: Payer: Medicare PPO | Attending: Rehabilitation

## 2022-01-10 DIAGNOSIS — M25552 Pain in left hip: Secondary | ICD-10-CM | POA: Insufficient documentation

## 2022-01-10 DIAGNOSIS — M6281 Muscle weakness (generalized): Secondary | ICD-10-CM | POA: Diagnosis present

## 2022-01-10 DIAGNOSIS — M5459 Other low back pain: Secondary | ICD-10-CM | POA: Insufficient documentation

## 2022-01-10 NOTE — Therapy (Signed)
[facility] [place], [place], [address] Phone: [phone]   Fax:  [phone]  Physical Therapy Treatment  Patient Details  Name: Felicia Pugh MRN: [id number] Date of Birth: 02-May-1946 Referring Provider (PT): [place], [place]   Encounter Date: 01/10/2022   PT End of Session - 01/10/22 0953     Visit Number 8    Number of Visits 12    Date for PT Re-Evaluation 03/08/22    PT Start Time 0945    PT Stop Time 4801    PT Time Calculation (min) 58 min    Activity Tolerance Patient tolerated treatment well    Behavior During Therapy [facility] for tasks assessed/performed             Past Medical History:  Diagnosis Date   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 Sonny Dandy, no longer seeing anyone (02/2015)   Cataract    Complication of anesthesia    difficulty breathing after waking up from anesthesia   Crohn's disease ([place]) 2016   diagnosed in 2016 and started on Colazal, but inflammation/ulceration in ileum/cecum dates back to 2006.   Diabetes mellitus without complication ([place])    Diabetic neuropathy ([place])    Hyperlipidemia    Hypertension    IBS (irritable bowel syndrome)    Stroke ([place]) 08/12/2009   Syncope and collapse    Thyroid nodule    Ulcer     Past Surgical History:  Procedure Laterality Date   ABDOMINAL HYSTERECTOMY     partial   BACK SURGERY     x3 (Dr Loyola Mast, Dr Patrice Paradise)   BREAST SURGERY Right 2002   cyst removed   COLONOSCOPY  2006   RMR: Normal colon except for ileocecal valve, eroded/ulcerated areas at ileocecal valve with mucosal hemorrhage present status post biopsy. ulcer (ileocecal valve, biopsy): Chronic active inflammation with ulceration, nonspecific.   COLONOSCOPY  09/06/2009   RMR: Suboptimal prep on the right side. Ulcers noted at IC valve, base of cecum, and scattered through food part of ascending colon. Pathology wiht chronic active colitis, question Crohn's   COLONOSCOPY  N/A 03/27/2015   Dr. Gala Romney: abnormal cecum and IC valve most consistent with IBD. TI intubated and appeared normal. Likely Crohn's disease. Patient denies NSAID use. No improvement with Lialda samples at time of initial diagnosis   COLONOSCOPY N/A 05/15/2017   Dr. Gala Romney: ab normal IC valve, s/p biopsy with active ileitis, diverticulosis in sigmoid and descending colon. Non-bleeding internal hemorrhoids   ESOPHAGOGASTRODUODENOSCOPY     HAND SURGERY Right    Carpel tunnel and right elbow   LAPAROSCOPIC APPENDECTOMY N/A 07/21/2016   Procedure: APPENDECTOMY LAPAROSCOPIC;  Surgeon: Aviva Signs, MD;  Location: [facility];  Service: General;  Laterality: N/A;   Small bowel capsule endoscopy  06/10/2005   RMR: single erosion versus AVM, distal ileum   Small bowel capsule endoscopy  prior to 05/2005   [place]: reported erosions/ulcerations per medical record. actual report unavailable.    There were no vitals filed for this visit.   Subjective Assessment - 01/10/22 0951     Subjective Pt arrives for today's treatment session reporting 5/10 left low back and LE pain.    Pertinent History multiple lumbar surgeries, CVA in 2010    Limitations Sitting;Standing;Walking;House hold activities    How long can you sit comfortably? about 30 minutes at most (limited by left hip)    How long can you walk comfortably? about 45 minutes (limited by  low back)    Patient Stated Goals reduced pain, sleep longer (less than 3 hours prior to the injection), sit and stand longer    Currently in Pain? Yes    Pain Score 5     Pain Location Back    Pain Orientation Left    Pain Descriptors / Indicators Radiating    Pain Onset More than a month ago    Pain Onset More than a month ago                               [facility] Adult PT Treatment/Exercise - 01/10/22 0001       Knee/Hip Exercises: Aerobic   Nustep Lvl 5 x 5 mins; Lvl 4 x 15 mins      Modalities   Modalities Electrical Stimulation;Moist  Heat      Moist Heat Therapy   Number Minutes Moist Heat 15 Minutes    Moist Heat Location Lumbar Spine;Hip      Electrical Stimulation   Electrical Stimulation Location Left lumbar spine    Electrical Stimulation Action IFC    Electrical Stimulation Parameters 80-150 Hz x 15 mins    Electrical Stimulation Goals Pain;Tone      Manual Therapy   Manual Therapy Soft tissue mobilization    Soft tissue mobilization STW/M to left lumbar paraspinals and left upper glute with pt in right side-lying to decrease pain and tone                          PT Long Term Goals - 12/12/21 1505       PT LONG TERM GOAL #1   Title Patient will be independent with her HEP.    Time 6    Period Weeks    Status New    Target Date 01/16/22      PT LONG TERM GOAL #2   Title Patient will report being able to sleep at least 5 hours without being awakened by her familiar low back and hip pain.    Time 6    Period Weeks    Status New    Target Date 01/16/22      PT LONG TERM GOAL #3   Title Patient will be able to complete her daily activities without her familair pain exceeding a 6/10.    Time 6    Period Weeks    Status New    Target Date 01/16/22                   Plan - 01/10/22 0955     Clinical Impression Statement Pt arrives for today's treatment session reporting 5/10 left low back and left LE pain.  Due to increase pain today, pt requested to not perform Cybex machines/exercises today and to perform increased STW.  STW/M performed to left lumbar paraspinals and left upper glute to decrease pain and tone with pt positioned in right side-lying.  Normal responses to estim and MH upon removal.  Pt reported 4/10 left back and LE pain at completion of today's treatment session.    Personal Factors and Comorbidities Time since onset of injury/illness/exacerbation;Comorbidity 3+    Comorbidities HTN, DM, osteopenia, history of CVA    Examination-Activity Limitations  Locomotion Level;Sit;Sleep;Stand;Lift    Examination-Participation Restrictions Church;Cleaning;Community Activity;Shop    Stability/Clinical Decision Making Evolving/Moderate complexity    Rehab Potential Good    PT Frequency 2x /  week    PT Duration 6 weeks    PT Treatment/Interventions ADLs/Self Care Home Management;Electrical Stimulation;Cryotherapy;Moist Heat;Traction;Ultrasound;Neuromuscular re-education;Balance training;Therapeutic exercise;Therapeutic activities;Functional mobility training;Gait training;Stair training;Patient/family education;Manual techniques;Passive range of motion;Taping    PT Next Visit Plan nustep, lower extremity strengthening and balance interventions, and modalities as needed    PT Home Exercise Plan Access Code: [email or web address]  URL: [email or web address]  Date: 12/12/2021  Prepared by: Jacqulynn Cadet    Exercises  - Lower Trunk Rotations  - 2 x daily - 7 x weekly - 3 sets - 10 reps  - Seated Hip Adduction Isometrics with Ball  - 1 x daily - 7 x weekly - 2 sets - 10 reps - 3-5 seconds  hold    Consulted and Agree with Plan of Care Patient             Patient will benefit from skilled therapeutic intervention in order to improve the following deficits and impairments:  Difficulty walking, Decreased range of motion, Impaired tone, Decreased activity tolerance, Pain, Decreased balance, Hypomobility, Decreased strength, Decreased mobility  Visit Diagnosis: Other low back pain  Pain in left hip  Muscle weakness (generalized)     Problem List Patient Active Problem List   Diagnosis Date Noted   Crohn's disease of both small and large intestine without complication ([place]) 09/32/3557   Loose stools 06/07/2021   Bilateral carpal tunnel syndrome 10/17/2020   Obesity (BMI 30-39.9) 05/08/2017   Solitary pulmonary nodule 05/08/2017   GERD (gastroesophageal reflux disease) 05/02/2017   Type 2 diabetes mellitus with other specified complication ([place])  32/20/2542   Crohn's disease with complication ([place]) 70/62/3762   Diverticulosis of colon without hemorrhage    History of stroke 07/08/2014   Osteopenia 05/11/2014   Hyperlipidemia associated with type 2 diabetes mellitus ([place]) 06/01/2008   Anemia, chronic disease 06/01/2008   Hypertension associated with diabetes ([place]) 06/01/2008   Rationale for Evaluation and Treatment Rehabilitation  Kathrynn Ducking, PTA 01/10/2022, 10:50 AM  [facility] [place], [place], [address] Phone: [phone]   Fax:  [phone]  Name: Felicia Pugh MRN: [id number] Date of Birth: [birth year]

## 2022-01-15 ENCOUNTER — Ambulatory Visit: Payer: Medicare PPO

## 2022-01-15 DIAGNOSIS — M5459 Other low back pain: Secondary | ICD-10-CM | POA: Diagnosis not present

## 2022-01-15 DIAGNOSIS — M6281 Muscle weakness (generalized): Secondary | ICD-10-CM

## 2022-01-15 DIAGNOSIS — M25552 Pain in left hip: Secondary | ICD-10-CM

## 2022-01-15 NOTE — Therapy (Signed)
[facility] [address], [place], [address] Phone: [phone]   Fax:  [phone]  Physical Therapy Treatment  Patient Details  Name: Felicia Pugh MRN: [id number] Date of Birth: 21-Jan-1946 Referring Provider (PT): [place], [place]   Encounter Date: 01/15/2022   PT End of Session - 01/15/22 1005     Visit Number 9    Number of Visits 12    Date for PT Re-Evaluation 03/08/22    PT Start Time 0900    PT Stop Time 1003    PT Time Calculation (min) 63 min    Activity Tolerance Patient tolerated treatment well    Behavior During Therapy [facility] for tasks assessed/performed             Past Medical History:  Diagnosis Date   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 Sonny Dandy, no longer seeing anyone (02/2015)   Cataract    Complication of anesthesia    difficulty breathing after waking up from anesthesia   Crohn's disease ([place]) 2016   diagnosed in 2016 and started on Colazal, but inflammation/ulceration in ileum/cecum dates back to 2006.   Diabetes mellitus without complication ([place])    Diabetic neuropathy ([place])    Hyperlipidemia    Hypertension    IBS (irritable bowel syndrome)    Stroke ([place]) 08/12/2009   Syncope and collapse    Thyroid nodule    Ulcer     Past Surgical History:  Procedure Laterality Date   ABDOMINAL HYSTERECTOMY     partial   BACK SURGERY     x3 (Dr Loyola Mast, Dr Patrice Paradise)   BREAST SURGERY Right 2002   cyst removed   COLONOSCOPY  2006   RMR: Normal colon except for ileocecal valve, eroded/ulcerated areas at ileocecal valve with mucosal hemorrhage present status post biopsy. ulcer (ileocecal valve, biopsy): Chronic active inflammation with ulceration, nonspecific.   COLONOSCOPY  09/06/2009   RMR: Suboptimal prep on the right side. Ulcers noted at IC valve, base of cecum, and scattered through food part of ascending colon. Pathology wiht chronic active colitis, question Crohn's   COLONOSCOPY  N/A 03/27/2015   Dr. Gala Romney: abnormal cecum and IC valve most consistent with IBD. TI intubated and appeared normal. Likely Crohn's disease. Patient denies NSAID use. No improvement with Lialda samples at time of initial diagnosis   COLONOSCOPY N/A 05/15/2017   Dr. Gala Romney: ab normal IC valve, s/p biopsy with active ileitis, diverticulosis in sigmoid and descending colon. Non-bleeding internal hemorrhoids   ESOPHAGOGASTRODUODENOSCOPY     HAND SURGERY Right    Carpel tunnel and right elbow   LAPAROSCOPIC APPENDECTOMY N/A 07/21/2016   Procedure: APPENDECTOMY LAPAROSCOPIC;  Surgeon: Aviva Signs, MD;  Location: [facility];  Service: General;  Laterality: N/A;   Small bowel capsule endoscopy  06/10/2005   RMR: single erosion versus AVM, distal ileum   Small bowel capsule endoscopy  prior to 05/2005   [place]: reported erosions/ulcerations per medical record. actual report unavailable.    There were no vitals filed for this visit.   Subjective Assessment - 01/15/22 0903     Subjective Patient reports that her pain will be better some days and worse others. She notes that yesterday was a bad day as her hip was really hurting.    Pertinent History multiple lumbar surgeries, CVA in 2010    Limitations Sitting;Standing;Walking;House hold activities    How long can you sit comfortably? about 30 minutes at most (limited by left hip)  How long can you walk comfortably? about 45 minutes (limited by low back)    Patient Stated Goals reduced pain, sleep longer (less than 3 hours prior to the injection), sit and stand longer    Currently in Pain? Yes    Pain Score 6     Pain Location Hip    Pain Orientation Left    Pain Onset More than a month ago    Pain Onset More than a month ago                               [facility] Adult PT Treatment/Exercise - 01/15/22 0001       Lumbar Exercises: Standing   Other Standing Lumbar Exercises Slouch overcorrect   30 reps     Knee/Hip Exercises:  Aerobic   Nustep L4 x 18 minutes      Knee/Hip Exercises: Supine   Hip Adduction Isometric Both   2.5 minutes; 3 second hold   Straight Leg Raises --   attempted, but unable to complete due to significant pulling in low back   Other Supine Knee/Hip Exercises Marching   LLE; 20 reps     Modalities   Modalities Electrical Stimulation;Moist Heat      Moist Heat Therapy   Number Minutes Moist Heat 15 Minutes    Moist Heat Location Lumbar Spine      Electrical Stimulation   Electrical Stimulation Location bilateral lumbar paraspinals    Electrical Stimulation Action IFC    Electrical Stimulation Parameters 80-150 Hz w/ 40% scan x 15 minutes    Electrical Stimulation Goals Pain;Tone      Manual Therapy   Manual Therapy Soft tissue mobilization;Manual Traction    Soft tissue mobilization to left IT band and quadriceps    Manual Traction LLE long axis distraction                          PT Long Term Goals - 12/12/21 1505       PT LONG TERM GOAL #1   Title Patient will be independent with her HEP.    Time 6    Period Weeks    Status New    Target Date 01/16/22      PT LONG TERM GOAL #2   Title Patient will report being able to sleep at least 5 hours without being awakened by her familiar low back and hip pain.    Time 6    Period Weeks    Status New    Target Date 01/16/22      PT LONG TERM GOAL #3   Title Patient will be able to complete her daily activities without her familair pain exceeding a 6/10.    Time 6    Period Weeks    Status New    Target Date 01/16/22                   Plan - 01/15/22 0950     Clinical Impression Statement Treatment focused on manual therapy with left lower extremity long axis distraction being the most effective at reducing her familiar symptoms. This was followed by interventions for improved lumbar and hip stability. She required minimal cueing with these interventions for proper exercise performance. She  reported feeling a little better after today's interventions. She continues to require skilled physical therapy to address her remaining impairments to maximize her functional mobility.  Personal Factors and Comorbidities Time since onset of injury/illness/exacerbation;Comorbidity 3+    Comorbidities HTN, DM, osteopenia, history of CVA    Examination-Activity Limitations Locomotion Level;Sit;Sleep;Stand;Lift    Examination-Participation Restrictions Church;Cleaning;Community Activity;Shop    Stability/Clinical Decision Making Evolving/Moderate complexity    Rehab Potential Good    PT Frequency 2x / week    PT Duration 6 weeks    PT Treatment/Interventions ADLs/Self Care Home Management;Electrical Stimulation;Cryotherapy;Moist Heat;Traction;Ultrasound;Neuromuscular re-education;Balance training;Therapeutic exercise;Therapeutic activities;Functional mobility training;Gait training;Stair training;Patient/family education;Manual techniques;Passive range of motion;Taping    PT Next Visit Plan nustep, lower extremity strengthening and balance interventions, and modalities as needed    PT Home Exercise Plan Access Code: [email or web address]  URL: [email or web address]  Date: 12/12/2021  Prepared by: Jacqulynn Cadet    Exercises  - Lower Trunk Rotations  - 2 x daily - 7 x weekly - 3 sets - 10 reps  - Seated Hip Adduction Isometrics with Ball  - 1 x daily - 7 x weekly - 2 sets - 10 reps - 3-5 seconds  hold    Consulted and Agree with Plan of Care Patient             Patient will benefit from skilled therapeutic intervention in order to improve the following deficits and impairments:  Difficulty walking, Decreased range of motion, Impaired tone, Decreased activity tolerance, Pain, Decreased balance, Hypomobility, Decreased strength, Decreased mobility  Visit Diagnosis: Other low back pain  Pain in left hip  Muscle weakness (generalized)     Problem List Patient Active Problem List    Diagnosis Date Noted   Crohn's disease of both small and large intestine without complication ([place]) 21/97/5883   Loose stools 06/07/2021   Bilateral carpal tunnel syndrome 10/17/2020   Obesity (BMI 30-39.9) 05/08/2017   Solitary pulmonary nodule 05/08/2017   GERD (gastroesophageal reflux disease) 05/02/2017   Type 2 diabetes mellitus with other specified complication ([place]) 25/49/8264   Crohn's disease with complication ([place]) 15/83/0940   Diverticulosis of colon without hemorrhage    History of stroke 07/08/2014   Osteopenia 05/11/2014   Hyperlipidemia associated with type 2 diabetes mellitus ([place]) 06/01/2008   Anemia, chronic disease 06/01/2008   Hypertension associated with diabetes ([place]) 06/01/2008   Rationale for Evaluation and Treatment Rehabilitation   Darlin Coco, PT 01/15/2022, 12:34 PM  [facility] [place], [place], [address] Phone: [phone]   Fax:  [phone]  Name: Felicia Pugh MRN: [id number] Date of Birth: Jan 09, 1946

## 2022-01-17 ENCOUNTER — Ambulatory Visit: Payer: Medicare PPO

## 2022-01-17 DIAGNOSIS — M6281 Muscle weakness (generalized): Secondary | ICD-10-CM

## 2022-01-17 DIAGNOSIS — M5459 Other low back pain: Secondary | ICD-10-CM

## 2022-01-17 DIAGNOSIS — M25552 Pain in left hip: Secondary | ICD-10-CM

## 2022-01-17 NOTE — Therapy (Addendum)
[facility] [place], [place], [address] Phone: [phone]   Fax:  [phone]  Physical Therapy Treatment  Patient Details  Name: Felicia Pugh MRN: [id number] Date of Birth: 1946-05-29 Referring Provider (PT): [place], [place]   Encounter Date: 01/17/2022   PT End of Session - 01/17/22 1520     Visit Number 10    Number of Visits 12    Date for PT Re-Evaluation 03/08/22    PT Start Time 3151    PT Stop Time 1605    PT Time Calculation (min) 50 min    Activity Tolerance Patient tolerated treatment well    Behavior During Therapy [facility] for tasks assessed/performed             Past Medical History:  Diagnosis Date   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 Sonny Dandy, no longer seeing anyone (02/2015)   Cataract    Complication of anesthesia    difficulty breathing after waking up from anesthesia   Crohn's disease ([place]) 2016   diagnosed in 2016 and started on Colazal, but inflammation/ulceration in ileum/cecum dates back to 2006.   Diabetes mellitus without complication ([place])    Diabetic neuropathy ([place])    Hyperlipidemia    Hypertension    IBS (irritable bowel syndrome)    Stroke ([place]) 08/12/2009   Syncope and collapse    Thyroid nodule    Ulcer     Past Surgical History:  Procedure Laterality Date   ABDOMINAL HYSTERECTOMY     partial   BACK SURGERY     x3 (Dr Loyola Mast, Dr Patrice Paradise)   BREAST SURGERY Right 2002   cyst removed   COLONOSCOPY  2006   RMR: Normal colon except for ileocecal valve, eroded/ulcerated areas at ileocecal valve with mucosal hemorrhage present status post biopsy. ulcer (ileocecal valve, biopsy): Chronic active inflammation with ulceration, nonspecific.   COLONOSCOPY  09/06/2009   RMR: Suboptimal prep on the right side. Ulcers noted at IC valve, base of cecum, and scattered through food part of ascending colon. Pathology wiht chronic active colitis, question Crohn's   COLONOSCOPY  N/A 03/27/2015   Dr. Gala Romney: abnormal cecum and IC valve most consistent with IBD. TI intubated and appeared normal. Likely Crohn's disease. Patient denies NSAID use. No improvement with Lialda samples at time of initial diagnosis   COLONOSCOPY N/A 05/15/2017   Dr. Gala Romney: ab normal IC valve, s/p biopsy with active ileitis, diverticulosis in sigmoid and descending colon. Non-bleeding internal hemorrhoids   ESOPHAGOGASTRODUODENOSCOPY     HAND SURGERY Right    Carpel tunnel and right elbow   LAPAROSCOPIC APPENDECTOMY N/A 07/21/2016   Procedure: APPENDECTOMY LAPAROSCOPIC;  Surgeon: Aviva Signs, MD;  Location: [facility];  Service: General;  Laterality: N/A;   Small bowel capsule endoscopy  06/10/2005   RMR: single erosion versus AVM, distal ileum   Small bowel capsule endoscopy  prior to 05/2005   [place]: reported erosions/ulcerations per medical record. actual report unavailable.    There were no vitals filed for this visit.   Subjective Assessment - 01/17/22 1519     Subjective Patient reports that she felt better after her last appointment, but she notes that it only lasted a day before her pain returned.    Pertinent History multiple lumbar surgeries, CVA in 2010    Limitations Sitting;Standing;Walking;House hold activities    How long can you sit comfortably? about 30 minutes at most (limited by left hip)    How long  can you walk comfortably? about 45 minutes (limited by low back)    Patient Stated Goals reduced pain, sleep longer (less than 3 hours prior to the injection), sit and stand longer    Currently in Pain? Yes    Pain Score 5     Pain Location Back    Pain Onset More than a month ago    Pain Onset More than a month ago                [facility] PT Assessment - 01/17/22 0001       AROM   Lumbar Flexion 52    Lumbar Extension 4   low back and hip pain   Lumbar - Right Rotation 50% limited   limited by pulling in low back   Lumbar - Left Rotation 50% limited   limited by  pulling in low back                          [facility] Adult PT Treatment/Exercise - 01/17/22 0001       Knee/Hip Exercises: Aerobic   Nustep L4 x 15 minutes      Knee/Hip Exercises: Standing   Other Standing Knee Exercises Ball press   5 second hold for 2 minutes   Other Standing Knee Exercises Ball roll out   multidirectional; 2 minutes     Modalities   Modalities Electrical Stimulation;Moist Heat   no redness or adverse reaction with today's modalities     Moist Heat Therapy   Number Minutes Moist Heat 15 Minutes    Moist Heat Location Lumbar Spine      Electrical Stimulation   Electrical Stimulation Location bilateral lumbar paraspinals    Electrical Stimulation Action IFC    Electrical Stimulation Parameters 80-150 Hz w/ 40% scan x 16 minutes    Electrical Stimulation Goals Pain;Tone                     PT Education - 01/17/22 1633     Education Details healing, follow up with referring provider, progress with therapy    Person(s) Educated Patient    Methods Explanation    Comprehension Verbalized understanding                 PT Long Term Goals - 01/17/22 1532       PT LONG TERM GOAL #1   Title Patient will be independent with her HEP.    Time 6    Period Weeks    Status On-going    Target Date 01/16/22      PT LONG TERM GOAL #2   Title Patient will report being able to sleep at least 5 hours without being awakened by her familiar low back and hip pain.    Baseline about 2-3 hours    Time 6    Period Weeks    Status On-going    Target Date 01/16/22      PT LONG TERM GOAL #3   Title Patient will be able to complete her daily activities without her familair pain exceeding a 6/10.    Time 6    Period Weeks    Status On-going    Target Date 01/16/22                   Plan - 01/17/22 1521     Clinical Impression Statement Patient has made no significant progress with skilled physical therapy as   evidenced by her  subjective reports, objective measures, or progress toward her goals. She exhibited a significant improvement with lumbar flexion, but no change in her familiar pain or discomfort with AROM. Treatment focused on familiar interventions for light lumbar stability and modalites as these were the most effective at reducing her familiar symptoms. However, this only provides short-term relief. Recommend that she follow up with her referring provider due to the limited effectiveness of physical therapy.    Personal Factors and Comorbidities Time since onset of injury/illness/exacerbation;Comorbidity 3+    Comorbidities HTN, DM, osteopenia, history of CVA    Examination-Activity Limitations Locomotion Level;Sit;Sleep;Stand;Lift    Examination-Participation Restrictions Church;Cleaning;Community Activity;Shop    Stability/Clinical Decision Making Evolving/Moderate complexity    Rehab Potential Good    PT Frequency 2x / week    PT Duration 6 weeks    PT Treatment/Interventions ADLs/Self Care Home Management;Electrical Stimulation;Cryotherapy;Moist Heat;Traction;Ultrasound;Neuromuscular re-education;Balance training;Therapeutic exercise;Therapeutic activities;Functional mobility training;Gait training;Stair training;Patient/family education;Manual techniques;Passive range of motion;Taping    PT Next Visit Plan nustep, lower extremity strengthening and balance interventions, and modalities as needed    PT Home Exercise Plan Access Code: [email or web address]  URL: [email or web address]  Date: 12/12/2021  Prepared by: Jacqulynn Cadet    Exercises  - Lower Trunk Rotations  - 2 x daily - 7 x weekly - 3 sets - 10 reps  - Seated Hip Adduction Isometrics with Ball  - 1 x daily - 7 x weekly - 2 sets - 10 reps - 3-5 seconds  hold    Consulted and Agree with Plan of Care Patient             Patient will benefit from skilled therapeutic intervention in order to improve the following deficits and impairments:   Difficulty walking, Decreased range of motion, Impaired tone, Decreased activity tolerance, Pain, Decreased balance, Hypomobility, Decreased strength, Decreased mobility  Visit Diagnosis: Other low back pain  Pain in left hip  Muscle weakness (generalized)     Problem List Patient Active Problem List   Diagnosis Date Noted   Crohn's disease of both small and large intestine without complication ([place]) 93/71/6967   Loose stools 06/07/2021   Bilateral carpal tunnel syndrome 10/17/2020   Obesity (BMI 30-39.9) 05/08/2017   Solitary pulmonary nodule 05/08/2017   GERD (gastroesophageal reflux disease) 05/02/2017   Type 2 diabetes mellitus with other specified complication ([place]) 89/38/1017   Crohn's disease with complication ([place]) 51/09/5850   Diverticulosis of colon without hemorrhage    History of stroke 07/08/2014   Osteopenia 05/11/2014   Hyperlipidemia associated with type 2 diabetes mellitus ([place]) 06/01/2008   Anemia, chronic disease 06/01/2008   Hypertension associated with diabetes ([place]) 06/01/2008   Rationale for Evaluation and Treatment Rehabilitation   Darlin Coco, PT 01/17/2022, 4:34 PM  [facility] [address] [place], [place], [address] Phone: [phone]   Fax:  [phone]  Name: Felicia Pugh MRN: [id number] Date of Birth: 1945/12/31  Progress Note Reporting Period 12/12/21 to 01/17/22  See note below for Objective Data and Assessment of Progress/Goals.   See clinical impression statement   PHYSICAL THERAPY DISCHARGE SUMMARY  Visits from Start of Care: 10  Current functional level related to goals / functional outcomes: No significant improvement with skilled physical therapy    Remaining deficits: See clinical impression statement   Education / Equipment: HEP    Patient agrees to discharge. Patient goals were not met. Patient is being discharged due to lack of progress.  Jacqulynn Cadet, PT,  DPT

## 2022-02-05 ENCOUNTER — Ambulatory Visit: Admitting: Nutrition

## 2022-02-10 ENCOUNTER — Other Ambulatory Visit: Payer: Self-pay | Admitting: Family Medicine

## 2022-02-10 DIAGNOSIS — Z794 Long term (current) use of insulin: Secondary | ICD-10-CM

## 2022-02-14 DIAGNOSIS — M48062 Spinal stenosis, lumbar region with neurogenic claudication: Secondary | ICD-10-CM | POA: Diagnosis not present

## 2022-02-14 DIAGNOSIS — M4316 Spondylolisthesis, lumbar region: Secondary | ICD-10-CM | POA: Diagnosis not present

## 2022-02-14 DIAGNOSIS — M532X6 Spinal instabilities, lumbar region: Secondary | ICD-10-CM | POA: Diagnosis not present

## 2022-02-19 ENCOUNTER — Ambulatory Visit: Admitting: Nutrition

## 2022-02-19 DIAGNOSIS — M654 Radial styloid tenosynovitis [de Quervain]: Secondary | ICD-10-CM | POA: Diagnosis not present

## 2022-02-21 ENCOUNTER — Ambulatory Visit (INDEPENDENT_AMBULATORY_CARE_PROVIDER_SITE_OTHER): Payer: Medicare PPO | Admitting: Family Medicine

## 2022-02-21 ENCOUNTER — Encounter: Payer: Self-pay | Admitting: Family Medicine

## 2022-02-21 VITALS — BP 128/69 | HR 79 | Ht 62.0 in | Wt 166.0 lb

## 2022-02-21 DIAGNOSIS — Z01818 Encounter for other preprocedural examination: Secondary | ICD-10-CM | POA: Diagnosis not present

## 2022-02-21 DIAGNOSIS — R300 Dysuria: Secondary | ICD-10-CM

## 2022-02-21 DIAGNOSIS — E785 Hyperlipidemia, unspecified: Secondary | ICD-10-CM

## 2022-02-21 DIAGNOSIS — E1169 Type 2 diabetes mellitus with other specified complication: Secondary | ICD-10-CM

## 2022-02-21 DIAGNOSIS — I152 Hypertension secondary to endocrine disorders: Secondary | ICD-10-CM

## 2022-02-21 DIAGNOSIS — E1159 Type 2 diabetes mellitus with other circulatory complications: Secondary | ICD-10-CM | POA: Diagnosis not present

## 2022-02-21 DIAGNOSIS — M5136 Other intervertebral disc degeneration, lumbar region: Secondary | ICD-10-CM

## 2022-02-21 DIAGNOSIS — Z794 Long term (current) use of insulin: Secondary | ICD-10-CM | POA: Diagnosis not present

## 2022-02-21 LAB — CBC WITH DIFFERENTIAL/PLATELET
Basophils Absolute: 0.1 10*3/uL (ref 0.0–0.2)
Basos: 1 %
EOS (ABSOLUTE): 0.2 10*3/uL (ref 0.0–0.4)
Eos: 2 %
Hematocrit: 37.3 % (ref 34.0–46.6)
Hemoglobin: 11.9 g/dL (ref 11.1–15.9)
Immature Grans (Abs): 0 10*3/uL (ref 0.0–0.1)
Immature Granulocytes: 0 %
Lymphocytes Absolute: 1.5 10*3/uL (ref 0.7–3.1)
Lymphs: 18 %
MCH: 27.9 pg (ref 26.6–33.0)
MCHC: 31.9 g/dL (ref 31.5–35.7)
MCV: 87 fL (ref 79–97)
Monocytes Absolute: 0.7 10*3/uL (ref 0.1–0.9)
Monocytes: 8 %
Neutrophils Absolute: 6.1 10*3/uL (ref 1.4–7.0)
Neutrophils: 71 %
Platelets: 265 10*3/uL (ref 150–450)
RBC: 4.27 x10E6/uL (ref 3.77–5.28)
RDW: 13.6 % (ref 11.7–15.4)
WBC: 8.6 10*3/uL (ref 3.4–10.8)

## 2022-02-21 LAB — URINALYSIS
Bilirubin, UA: NEGATIVE
Ketones, UA: NEGATIVE
Leukocytes,UA: NEGATIVE
Nitrite, UA: NEGATIVE
Protein,UA: NEGATIVE
RBC, UA: NEGATIVE
Specific Gravity, UA: 1.025 (ref 1.005–1.030)
Urobilinogen, Ur: 0.2 mg/dL (ref 0.2–1.0)
pH, UA: 5 (ref 5.0–7.5)

## 2022-02-21 LAB — COAGUCHEK XS/INR WAIVED
INR: 1 (ref 0.9–1.1)
Prothrombin Time: 12.1 s

## 2022-02-21 LAB — BAYER DCA HB A1C WAIVED: HB A1C (BAYER DCA - WAIVED): 7.2 % — ABNORMAL HIGH (ref 4.8–5.6)

## 2022-02-21 MED ORDER — TRESIBA FLEXTOUCH 100 UNIT/ML ~~LOC~~ SOPN: PEN_INJECTOR | SUBCUTANEOUS | 1 refills | Status: DC

## 2022-02-21 NOTE — Progress Notes (Signed)
BP 128/69   Pulse 79   Ht 5' 2"  (1.575 m)   Wt 166 lb (75.3 kg)   SpO2 99%   BMI 30.36 kg/m    Subjective:   Patient ID: Felicia Pugh, female    DOB: 1946-01-09, 76 y.o.   MRN: [id number]  HPI: Felicia Pugh is a 76 y.o. female presenting on 02/21/2022 for Medical Management of Chronic Issues, Diabetes, and Preoperative clearance   HPI Preoperative clearance Patient is coming in today for preoperative clearance.  She is going to have back surgery with Dr. Louann Sjogren and it is a revision of her L2 S1 TLIF with general anesthesia.  Patient denies any chest pains or shortness of breath.  She says she does have a little bit of fatigue and she is currently having a little bit of dysuria that been going on for couple weeks.  She does admit to being low on her fluid intake and having darker urine.  She says has a little bit of irritation when she urinates.   Type 2 diabetes mellitus Patient comes in today for recheck of his diabetes. Patient has been currently taking Glyxambi and [place]. Patient is currently on an ACE inhibitor/ARB. Patient has not seen an ophthalmologist this year. Patient denies any issues with their feet. The symptom started onset as an adult hypertension and hyperlipidemia ARE RELATED TO DM   Hypertension Patient is currently on losartan, and their blood pressure today is 128/69. Patient denies any lightheadedness or dizziness. Patient denies headaches, blurred vision, chest pains, shortness of breath, or weakness. Denies any side effects from medication and is content with current medication.   Hyperlipidemia Patient is coming in for recheck of his hyperlipidemia. The patient is currently taking atorvastatin. They deny any issues with myalgias or history of liver damage from it. They deny any focal numbness or weakness or chest pain.   Relevant past medical, surgical, family and social history reviewed and updated as indicated. Interim medical history since our last visit  reviewed. Allergies and medications reviewed and updated.  Review of Systems  Constitutional:  Negative for chills and fever.  HENT:  Negative for congestion, ear discharge, ear pain and tinnitus.   Eyes:  Negative for pain and visual disturbance.  Respiratory:  Negative for cough, chest tightness, shortness of breath and wheezing.   Cardiovascular:  Negative for chest pain, palpitations and leg swelling.  Gastrointestinal:  Negative for abdominal pain, blood in stool, constipation and diarrhea.  Genitourinary:  Positive for dysuria and frequency. Negative for difficulty urinating and hematuria.  Musculoskeletal:  Positive for arthralgias and back pain. Negative for gait problem and myalgias.  Skin:  Negative for rash.  Neurological:  Negative for dizziness, weakness, light-headedness and headaches.  Psychiatric/Behavioral:  Negative for agitation, behavioral problems and suicidal ideas.   All other systems reviewed and are negative.   Per HPI unless specifically indicated above   Allergies as of 02/21/2022       Reactions   Metformin And Related    Severe diarrhea   Asa [aspirin]    GI Dr does not want her to take due to hx ulcers   Nsaids    GI Dr does not want her to take due to hx of ulcers   Other Diarrhea   Steroids   Tolmetin Other (See Comments)   GI Dr does not want her to take due to hx of ulcers GI Dr does not want her to take due to hx of ulcers  Medication List        Accurate as of February 21, 2022  9:33 AM. If you have any questions, ask your nurse or doctor.          atorvastatin 40 MG tablet Commonly known as: LIPITOR Take 1 tablet (40 mg total) by mouth daily.   balsalazide 750 MG capsule Commonly known as: COLAZAL Take 2 capsules (1,500 mg total) by mouth in the morning and at bedtime.   cholecalciferol 25 MCG (1000 UNIT) tablet Commonly known as: VITAMIN D3 Take 1,000 Units by mouth daily.   Glyxambi 25-5 MG Tabs Generic drug:  Empagliflozin-linaGLIPtin Take 1 tablet by mouth daily.   Insulin Syringe-Needle U-100 31G X 1/4" 1 ML Misc 1 Syringe by Does not apply route 2 (two) times daily.   loperamide 1 MG/5ML solution Commonly known as: IMODIUM Take 1 mg by mouth as needed for diarrhea or loose stools.   losartan 25 MG tablet Commonly known as: COZAAR Take 1 tablet (25 mg total) by mouth daily.   OneTouch Verio test strip Generic drug: glucose blood USE 1 STRIP TO CHECK GLUCOSE TWICE DAILY   Pen Needles 31G X 5 MM Misc Use to give insulin with flexpen   Tresiba FlexTouch 100 UNIT/ML FlexTouch Pen Generic drug: insulin degludec INJECT 40 UNITS SUBCUTANEOUSLY ONCE DAILY   vitamin C 250 MG tablet Commonly known as: ASCORBIC ACID Take 250 mg by mouth daily.   WOMENS 50+ MULTI VITAMIN/MIN PO Take by mouth.         Objective:   BP 128/69   Pulse 79   Ht 5' 2"  (1.575 m)   Wt 166 lb (75.3 kg)   SpO2 99%   BMI 30.36 kg/m   Wt Readings from Last 3 Encounters:  02/21/22 166 lb (75.3 kg)  12/06/21 170 lb 12.8 oz (77.5 kg)  11/19/21 172 lb 12.8 oz (78.4 kg)    Physical Exam Vitals and nursing note reviewed.  Constitutional:      General: She is not in acute distress.    Appearance: She is well-developed. She is not diaphoretic.  Eyes:     Conjunctiva/sclera: Conjunctivae normal.  Cardiovascular:     Rate and Rhythm: Normal rate and regular rhythm.     Heart sounds: Normal heart sounds. No murmur heard. Pulmonary:     Effort: Pulmonary effort is normal. No respiratory distress.     Breath sounds: Normal breath sounds. No wheezing.  Musculoskeletal:        General: No swelling or tenderness. Normal range of motion.  Skin:    General: Skin is warm and dry.     Findings: No rash.  Neurological:     Mental Status: She is alert and oriented to person, place, and time.     Coordination: Coordination normal.  Psychiatric:        Behavior: Behavior normal.     Urinalysis: 3+ glucose,  otherwise negative  EKG: Normal sinus rhythm with a heart rate 81  Assessment & Plan:   Problem List Items Addressed This Visit       Cardiovascular and Mediastinum   Hypertension associated with diabetes ([place])   Relevant Medications   insulin degludec (TRESIBA FLEXTOUCH) 100 UNIT/ML FlexTouch Pen     Endocrine   Hyperlipidemia associated with type 2 diabetes mellitus (HCC)   Relevant Medications   insulin degludec (TRESIBA FLEXTOUCH) 100 UNIT/ML FlexTouch Pen   Type 2 diabetes mellitus with other specified complication (HCC)   Relevant Medications  insulin degludec (TRESIBA FLEXTOUCH) 100 UNIT/ML FlexTouch Pen   Other Relevant Orders   Bayer DCA Hb A1c Waived   Other Visit Diagnoses     Preoperative clearance    -  Primary   Relevant Orders   EKG 12-Lead (Completed)   CBC with Differential/Platelet   CoaguChek XS/INR Waived   Dysuria       Relevant Orders   Urine Culture   Urinalysis   Degenerative disc disease, lumbar         A1c was up slightly at 7.2 but still doing well.  Will await the rest of her blood work but as long as everything looks good we will clear her for surgery.  She has a METS greater than 4 and should do well and should be low risk.  Will await culture for urine but did not see any signs of infection.  Follow up plan: Return in about 3 months (around 05/24/2022), or if symptoms worsen or fail to improve, for Diabetes and hypertension.  Counseling provided for all of the vaccine components Orders Placed This Encounter  Procedures   Urine Culture   CBC with Differential/Platelet   Bayer DCA Hb A1c Waived   CoaguChek XS/INR Waived   Urinalysis   EKG 12-Lead    Caryl Pina, MD [facility] 02/21/2022, 9:33 AM

## 2022-02-22 LAB — URINE CULTURE: Organism ID, Bacteria: NO GROWTH

## 2022-02-23 IMAGING — MR MR LUMBAR SPINE W/O CM
5 series · 31 of 48 positions shown · non-contrast
Comparison: X-ray lumbar 09/28/2004.

CLINICAL DATA: Chronic lower back with right hip pain. History of
lumbar surgery.

EXAM:
MRI LUMBAR SPINE WITHOUT CONTRAST
TECHNIQUE: Multiplanar, multisequence MR imaging of the lumbar spine was
performed. No intravenous contrast was administered.

[Series 5: T2 · sagittal · 4.0mm · 0.68mm/px · 6 of 15 slices shown (1 of 2)]
[im 1/15]
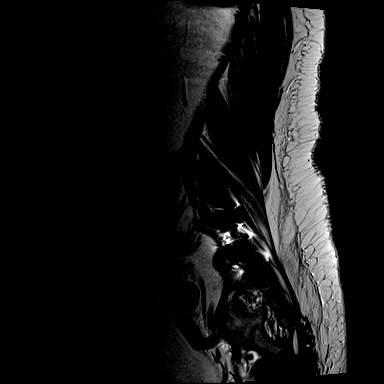
[im 3/15]
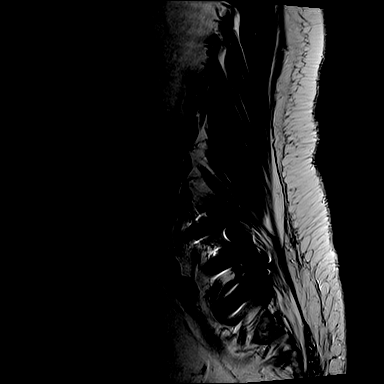
[im 6/15]
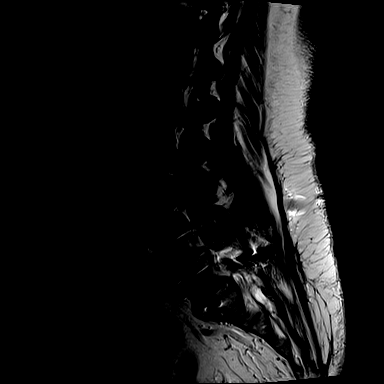
[im 9/15]
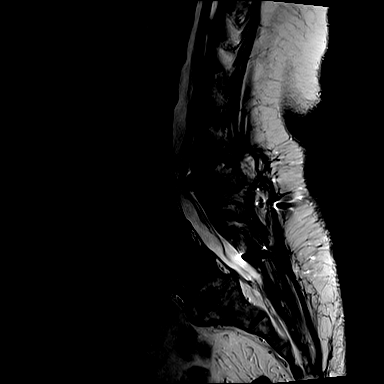
[im 12/15]
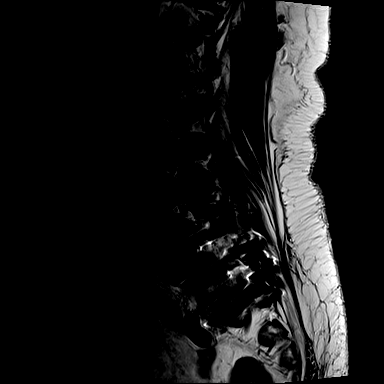
[im 15/15]
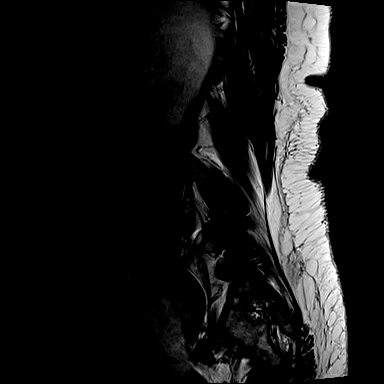

[Series 6: T1 · sagittal · 4.0mm · 0.81mm/px · 7 of 15 slices shown (1 of 2)]
[im 1/15]
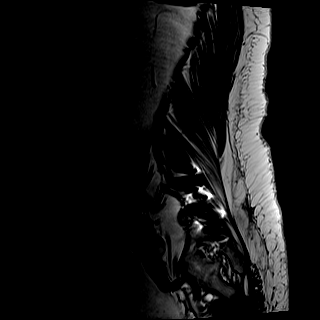
[im 3/15]
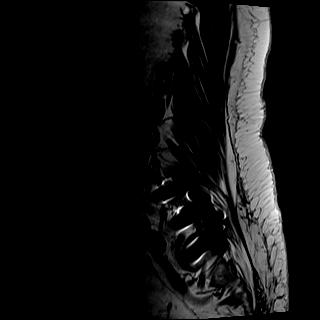
[im 5/15]
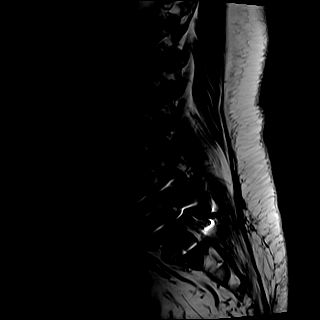
[im 8/15]
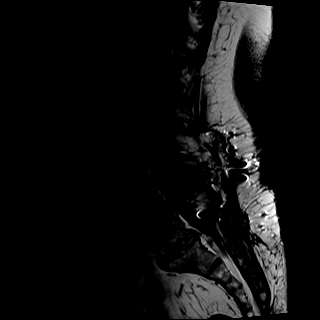
[im 10/15]
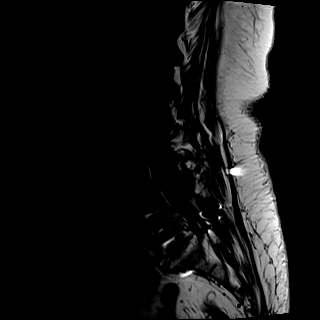
[im 12/15]
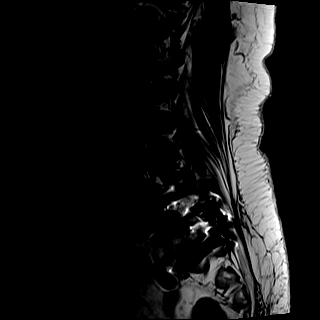
[im 15/15]
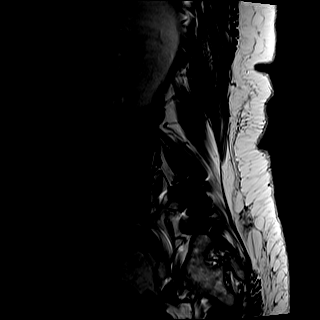

[Series 7: STIR · sagittal · 4.0mm · 0.51mm/px · 2 of 15 slices shown]
[im 1/15]
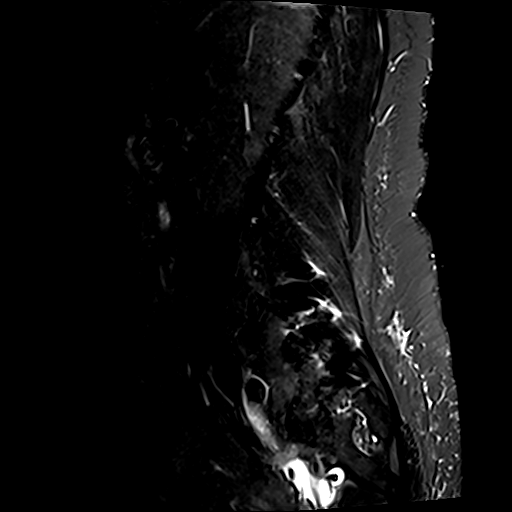
[im 3/15]
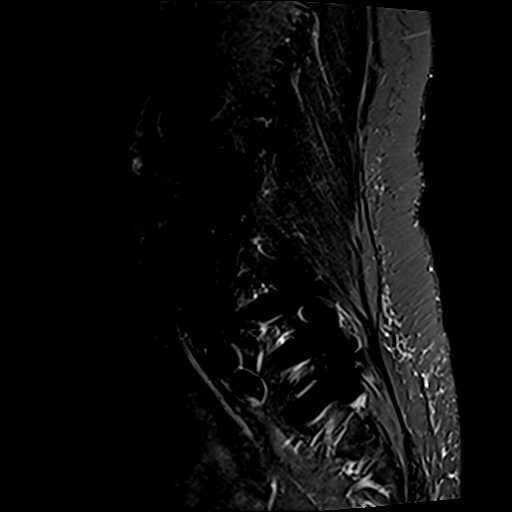

[Series 8: T2 · axial · 4.0mm · 0.70mm/px · z∈[-113,+80]mm · 8 of 31 slices shown (2 of 2)]
[im 1/31]
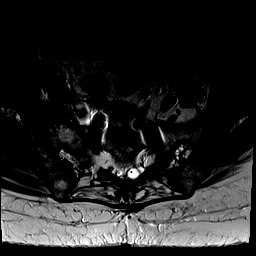
[im 5/31]
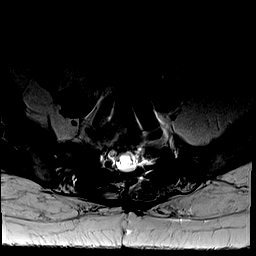
[im 10/31]
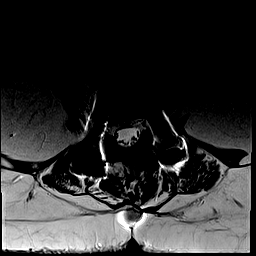
[im 14/31]
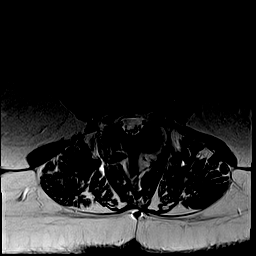
[im 17/31]
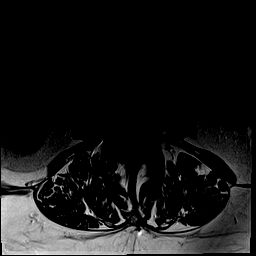
[im 21/31]
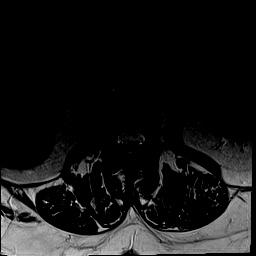
[im 26/31]
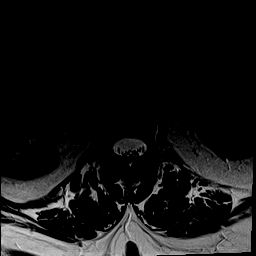
[im 31/31]
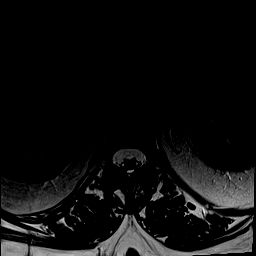

[Series 9: T1 · axial · 4.0mm · 0.35mm/px · z∈[-113,+80]mm · 8 of 31 slices shown (2 of 2)]
[im 1/31]
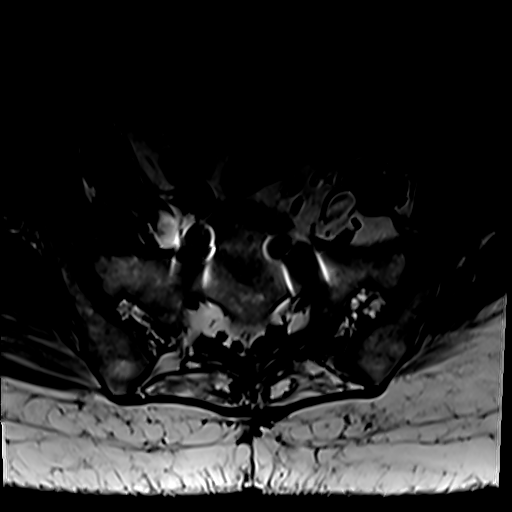
[im 5/31]
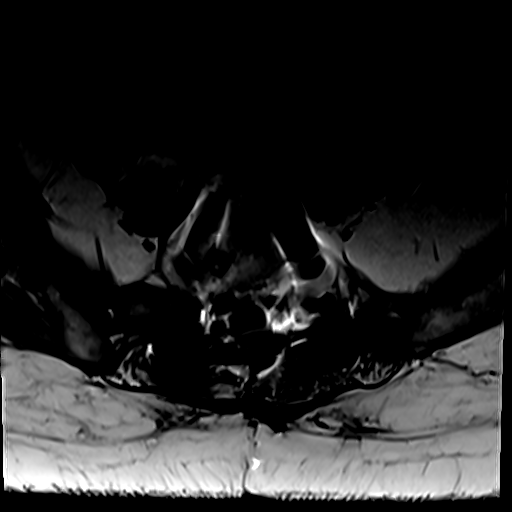
[im 10/31]
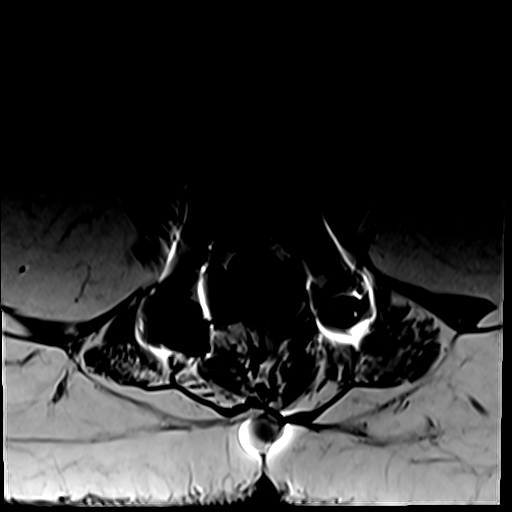
[im 14/31]
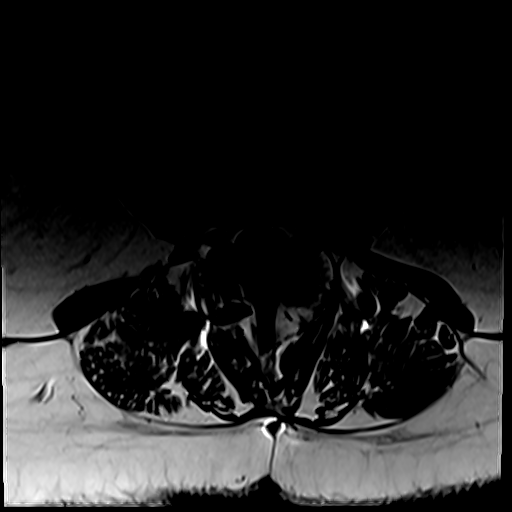
[im 17/31]
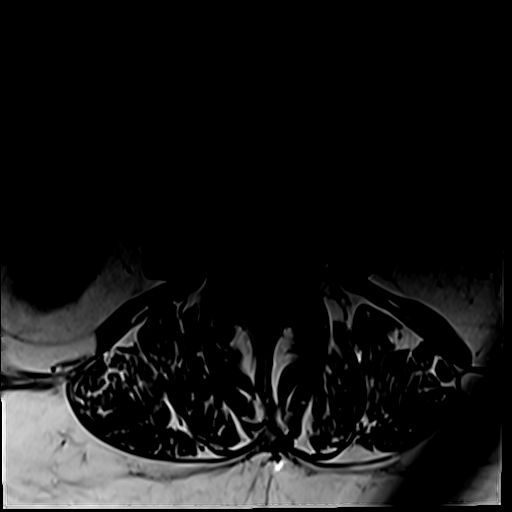
[im 21/31]
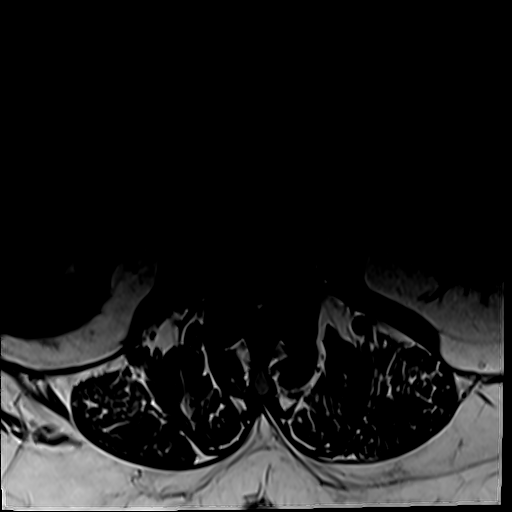
[im 26/31]
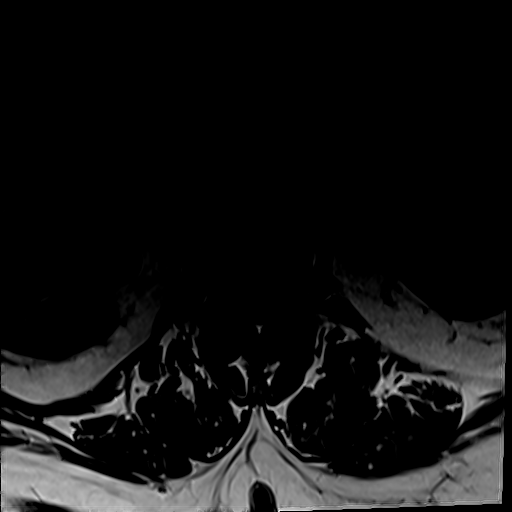
[im 31/31]
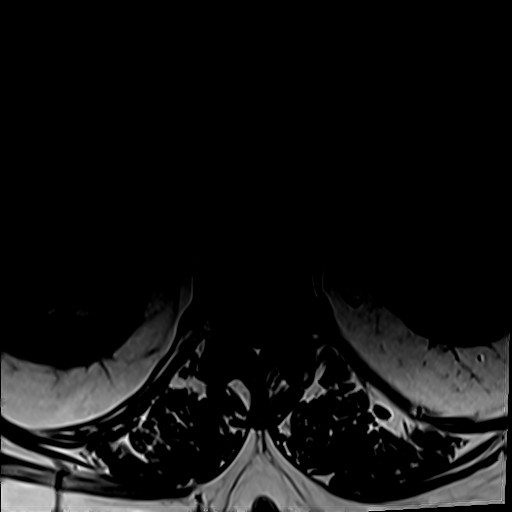

[31 of 48 positions shown; findings below may reference images not displayed]

FINDINGS: Segmentation: Standard.

Alignment: Physiologic.

Vertebrae: No fracture, evidence of discitis, or bone lesion.

Conus medullaris and cauda equina: Conus extends to the L1 level.
Conus and cauda equina appear normal.

Paraspinal and other soft tissues: No acute paraspinal abnormality.
Postsurgical changes in the posterior paraspinal soft tissues.

Disc levels:

Disc spaces: Posterior lumbar interbody fusion from L4 through S1.

T12-L1: No significant disc bulge. No neural foraminal stenosis. No
central canal stenosis.

L1-L2: Mild broad-based disc bulge. Mild bilateral facet
arthropathy. No foraminal or central canal stenosis.

L2-L3: Broad-based disc bulge. Moderate-severe bilateral facet
arthropathy with ligamentum flavum infolding. Severe spinal
stenosis. Mild right and moderate left foraminal stenosis.

L3-L4: Broad-based disc bulge. Moderate bilateral facet arthropathy.
No foraminal or central canal stenosis.

L4-L5: Interbody fusion.  No foraminal or central canal stenosis.

L5-S1: Interbody fusion.  No foraminal or central canal stenosis.
IMPRESSION: 1. At L2-3 there is a broad-based disc bulge. Moderate-severe
bilateral facet arthropathy with ligamentum flavum infolding. Severe
spinal stenosis. Mild right and moderate left foraminal stenosis.
2. Posterior lumbar interbody fusion from L4-S1 without foraminal or
central canal stenosis.

## 2022-03-01 ENCOUNTER — Other Ambulatory Visit: Payer: Self-pay | Admitting: *Deleted

## 2022-03-01 DIAGNOSIS — M25562 Pain in left knee: Secondary | ICD-10-CM

## 2022-03-05 DIAGNOSIS — E1142 Type 2 diabetes mellitus with diabetic polyneuropathy: Secondary | ICD-10-CM | POA: Diagnosis not present

## 2022-03-05 DIAGNOSIS — B351 Tinea unguium: Secondary | ICD-10-CM | POA: Diagnosis not present

## 2022-03-05 DIAGNOSIS — L84 Corns and callosities: Secondary | ICD-10-CM | POA: Diagnosis not present

## 2022-03-05 DIAGNOSIS — M79676 Pain in unspecified toe(s): Secondary | ICD-10-CM | POA: Diagnosis not present

## 2022-03-08 DIAGNOSIS — M48062 Spinal stenosis, lumbar region with neurogenic claudication: Secondary | ICD-10-CM | POA: Diagnosis not present

## 2022-03-08 DIAGNOSIS — M4316 Spondylolisthesis, lumbar region: Secondary | ICD-10-CM | POA: Diagnosis not present

## 2022-03-08 DIAGNOSIS — M532X6 Spinal instabilities, lumbar region: Secondary | ICD-10-CM | POA: Diagnosis not present

## 2022-03-12 HISTORY — PX: BACK SURGERY: SHX140

## 2022-03-13 DIAGNOSIS — M48062 Spinal stenosis, lumbar region with neurogenic claudication: Secondary | ICD-10-CM | POA: Diagnosis not present

## 2022-03-13 DIAGNOSIS — Z01812 Encounter for preprocedural laboratory examination: Secondary | ICD-10-CM | POA: Diagnosis not present

## 2022-03-13 DIAGNOSIS — R7989 Other specified abnormal findings of blood chemistry: Secondary | ICD-10-CM | POA: Diagnosis not present

## 2022-03-13 DIAGNOSIS — Z8673 Personal history of transient ischemic attack (TIA), and cerebral infarction without residual deficits: Secondary | ICD-10-CM | POA: Diagnosis not present

## 2022-03-14 ENCOUNTER — Encounter (HOSPITAL_COMMUNITY): Payer: Self-pay

## 2022-03-14 ENCOUNTER — Emergency Department (HOSPITAL_COMMUNITY)
Admission: EM | Admit: 2022-03-14 | Discharge: 2022-03-14 | Disposition: A | Discharge status: Home / Self Care | Payer: Medicare PPO | Attending: Emergency Medicine | Admitting: Emergency Medicine

## 2022-03-14 ENCOUNTER — Emergency Department (HOSPITAL_COMMUNITY): Payer: Medicare PPO

## 2022-03-14 ENCOUNTER — Other Ambulatory Visit: Payer: Self-pay

## 2022-03-14 DIAGNOSIS — I10 Essential (primary) hypertension: Secondary | ICD-10-CM | POA: Insufficient documentation

## 2022-03-14 DIAGNOSIS — U071 COVID-19: Secondary | ICD-10-CM | POA: Insufficient documentation

## 2022-03-14 DIAGNOSIS — K5090 Crohn's disease, unspecified, without complications: Secondary | ICD-10-CM | POA: Insufficient documentation

## 2022-03-14 DIAGNOSIS — E119 Type 2 diabetes mellitus without complications: Secondary | ICD-10-CM | POA: Insufficient documentation

## 2022-03-14 DIAGNOSIS — D72829 Elevated white blood cell count, unspecified: Secondary | ICD-10-CM | POA: Diagnosis not present

## 2022-03-14 DIAGNOSIS — Z794 Long term (current) use of insulin: Secondary | ICD-10-CM | POA: Insufficient documentation

## 2022-03-14 DIAGNOSIS — R000 Tachycardia, unspecified: Secondary | ICD-10-CM | POA: Diagnosis not present

## 2022-03-14 DIAGNOSIS — Z79899 Other long term (current) drug therapy: Secondary | ICD-10-CM | POA: Diagnosis not present

## 2022-03-14 DIAGNOSIS — R509 Fever, unspecified: Secondary | ICD-10-CM | POA: Diagnosis not present

## 2022-03-14 DIAGNOSIS — J9811 Atelectasis: Secondary | ICD-10-CM | POA: Diagnosis not present

## 2022-03-14 LAB — COMPREHENSIVE METABOLIC PANEL
ALT: 17 U/L (ref 0–44)
AST: 30 U/L (ref 15–41)
Albumin: 4.1 g/dL (ref 3.5–5.0)
Alkaline Phosphatase: 110 U/L (ref 38–126)
Anion gap: 9 (ref 5–15)
BUN: 14 mg/dL (ref 8–23)
CO2: 25 mmol/L (ref 22–32)
Calcium: 8.9 mg/dL (ref 8.9–10.3)
Chloride: 104 mmol/L (ref 98–111)
Creatinine, Ser: 0.84 mg/dL (ref 0.44–1.00)
GFR, Estimated: >60 mL/min (ref >60)
Glucose, Bld: 161 mg/dL — ABNORMAL HIGH (ref 70–99)
Potassium: 3.5 mmol/L (ref 3.5–5.1)
Sodium: 138 mmol/L (ref 135–145)
Total Bilirubin: 0.5 mg/dL (ref 0.3–1.2)
Total Protein: 7.7 g/dL (ref 6.5–8.1)

## 2022-03-14 LAB — URINALYSIS, ROUTINE W REFLEX MICROSCOPIC
Bacteria, UA: NONE SEEN
Bilirubin Urine: NEGATIVE
Glucose, UA: >=500 mg/dL — AB
Hgb urine dipstick: NEGATIVE
Ketones, ur: NEGATIVE mg/dL
Leukocytes,Ua: NEGATIVE
Nitrite: NEGATIVE
Protein, ur: NEGATIVE mg/dL
Specific Gravity, Urine: 1.033 — ABNORMAL HIGH (ref 1.005–1.030)
pH: 5 (ref 5.0–8.0)

## 2022-03-14 LAB — SARS CORONAVIRUS 2 BY RT PCR: SARS Coronavirus 2 by RT PCR: POSITIVE — AB

## 2022-03-14 LAB — CBC WITH DIFFERENTIAL/PLATELET
Abs Immature Granulocytes: 0.05 10*3/uL (ref 0.00–0.07)
Basophils Absolute: 0.1 10*3/uL (ref 0.0–0.1)
Basophils Relative: 1 %
Eosinophils Absolute: 0 10*3/uL (ref 0.0–0.5)
Eosinophils Relative: 0 %
HCT: 40.8 % (ref 36.0–46.0)
Hemoglobin: 12.8 g/dL (ref 12.0–15.0)
Immature Granulocytes: 0 %
Lymphocytes Relative: 9 %
Lymphs Abs: 1.1 10*3/uL (ref 0.7–4.0)
MCH: 28.1 pg (ref 26.0–34.0)
MCHC: 31.4 g/dL (ref 30.0–36.0)
MCV: 89.5 fL (ref 80.0–100.0)
Monocytes Absolute: 1.3 10*3/uL — ABNORMAL HIGH (ref 0.1–1.0)
Monocytes Relative: 10 %
Neutro Abs: 10 10*3/uL — ABNORMAL HIGH (ref 1.7–7.7)
Neutrophils Relative %: 80 %
Platelets: 223 10*3/uL (ref 150–400)
RBC: 4.56 MIL/uL (ref 3.87–5.11)
RDW: 14.2 % (ref 11.5–15.5)
WBC: 12.5 10*3/uL — ABNORMAL HIGH (ref 4.0–10.5)
nRBC: 0 % (ref 0.0–0.2)

## 2022-03-14 LAB — LACTIC ACID, PLASMA
Lactic Acid, Venous: 1.2 mmol/L (ref 0.5–1.9)
Lactic Acid, Venous: 1 mmol/L (ref 0.5–1.9)

## 2022-03-14 MED ORDER — SODIUM CHLORIDE 0.9 % IV SOLN: INTRAVENOUS | Status: DC

## 2022-03-14 MED ORDER — NIRMATRELVIR/RITONAVIR (PAXLOVID)TABLET: 3.0000 | ORAL_TABLET | Freq: Two times a day (BID) | ORAL | 0 refills | Status: AC

## 2022-03-14 NOTE — ED Triage Notes (Signed)
Pt BIB [facility], c/o fever that started this am. Temp 102.7 oral, 1000 tylenol given, 101.4 oral in triage. Denies difficulty breathing, no N/V/D, NAD.

## 2022-03-14 NOTE — Discharge Instructions (Addendum)
Testing positive for COVID.  Would recommend isolation for 7 days.  Over-the-counter cold and flu medicines as needed.  Certainly with the fever would take Tylenol on a regular basis.  Start the [facility] prescription tomorrow.  Take it for the next 5 days.  Return for any difficulty breathing or any low oxygen problems.

## 2022-03-14 NOTE — ED Provider Notes (Signed)
[facility] EMERGENCY DEPARTMENT Provider Note   CSN: [id number] Arrival date & time: 03/14/22  1759     History  Chief Complaint  Patient presents with   Chest Pain   Fever    Felicia Pugh is a 76 y.o. female.  Patient with acute onset of fever this morning.  Patient brought in by EMS.  Temp 102.7 orally.  Patient felt fine yesterday.  Patient denies any difficulty breathing or any nausea or vomiting or diarrhea does have some body aches.  No abdominal pain no dysuria.  Temp here 102.5 heart rate 104 respirations 27 blood pressures here have been fine without hypotension.  Past medical history significant for diabetes without complications irritable bowel syndrome hypertension hyperlipidemia history of Crohn's disease in 2016.  Past surgical history significant for abdominal hysterectomy and laparoscopic appendectomy.  Patient never smoked.       Home Medications Prior to Admission medications   Medication Sig Start Date End Date Taking? Authorizing Provider  nirmatrelvir/ritonavir EUA (PAXLOVID) 20 x 150 MG & 10 x 100MG TABS Take 3 tablets by mouth 2 (two) times daily for 5 days. Patient GFR is greater than 60. Take nirmatrelvir (150 mg) two tablets twice daily for 5 days and ritonavir (100 mg) one tablet twice daily for 5 days. 03/14/22 03/19/22 Yes Fredia Sorrow, MD  atorvastatin (LIPITOR) 40 MG tablet Take 1 tablet (40 mg total) by mouth daily. 08/03/21   Dettinger, Fransisca Kaufmann, MD  balsalazide (COLAZAL) 750 MG capsule Take 2 capsules (1,500 mg total) by mouth in the morning and at bedtime. 12/10/21   Erenest Rasher, PA-C  cholecalciferol (VITAMIN D3) 25 MCG (1000 UNIT) tablet Take 1,000 Units by mouth daily.    [provider]  Empagliflozin-linaGLIPtin (GLYXAMBI) 25-5 MG TABS Take 1 tablet by mouth daily. 08/03/21   Dettinger, Fransisca Kaufmann, MD  glucose blood (ONETOUCH VERIO) test strip USE 1 STRIP TO CHECK GLUCOSE TWICE DAILY 11/08/21   Dettinger, Fransisca Kaufmann, MD  insulin  degludec (TRESIBA FLEXTOUCH) 100 UNIT/ML FlexTouch Pen INJECT 40 UNITS SUBCUTANEOUSLY ONCE DAILY 02/21/22   Dettinger, Fransisca Kaufmann, MD  Insulin Pen Needle (PEN NEEDLES) 31G X 5 MM MISC Use to give insulin with flexpen 11/08/21   Dettinger, Fransisca Kaufmann, MD  Insulin Syringe-Needle U-100 31G X 1/4" 1 ML MISC 1 Syringe by Does not apply route 2 (two) times daily. 11/08/21   Dettinger, Fransisca Kaufmann, MD  loperamide (IMODIUM) 1 MG/5ML solution Take 1 mg by mouth as needed for diarrhea or loose stools.    [provider]  losartan (COZAAR) 25 MG tablet Take 1 tablet (25 mg total) by mouth daily. 08/03/21   Dettinger, Fransisca Kaufmann, MD  Multiple Vitamins-Minerals (WOMENS 50+ [place] VITAMIN/MIN PO) Take by mouth.    [provider]  vitamin C (ASCORBIC ACID) 250 MG tablet Take 250 mg by mouth daily.    [provider]      Allergies    Metformin and related, Asa [aspirin], Nsaids, Other, and Tolmetin    Review of Systems   Review of Systems  Constitutional:  Positive for fever. Negative for chills.  HENT:  Negative for ear pain and sore throat.   Eyes:  Negative for pain and visual disturbance.  Respiratory:  Negative for cough and shortness of breath.   Cardiovascular:  Negative for chest pain and palpitations.  Gastrointestinal:  Negative for abdominal pain and vomiting.  Genitourinary:  Negative for dysuria and hematuria.  Musculoskeletal:  Positive for myalgias. Negative  for arthralgias and back pain.  Skin:  Negative for color change and rash.  Neurological:  Negative for seizures and syncope.  All other systems reviewed and are negative.   Physical Exam Updated Vital Signs BP (!) 97/53   Pulse 85   Temp (!) 102.5 F (39.2 C) (Rectal)   Resp (!) 24   Ht 1.6 m (5' 3" )   Wt 75.3 kg   SpO2 97%   BMI 29.41 kg/m  Physical Exam Vitals and nursing note reviewed.  Constitutional:      General: She is not in acute distress.    Appearance: Normal appearance. She is well-developed.   HENT:     Head: Normocephalic and atraumatic.  Eyes:     Extraocular Movements: Extraocular movements intact.     Conjunctiva/sclera: Conjunctivae normal.     Pupils: Pupils are equal, round, and reactive to light.  Cardiovascular:     Rate and Rhythm: Normal rate and regular rhythm.     Heart sounds: No murmur heard. Pulmonary:     Effort: Pulmonary effort is normal. No respiratory distress.     Breath sounds: Normal breath sounds. No wheezing, rhonchi or rales.  Abdominal:     Palpations: Abdomen is soft.     Tenderness: There is no abdominal tenderness.  Musculoskeletal:        General: No swelling.     Cervical back: Normal range of motion and neck supple. No rigidity.  Skin:    General: Skin is warm and dry.     Capillary Refill: Capillary refill takes less than 2 seconds.  Neurological:     General: No focal deficit present.     Mental Status: She is alert and oriented to person, place, and time.     Cranial Nerves: No cranial nerve deficit.     Sensory: No sensory deficit.  Psychiatric:        Mood and Affect: Mood normal.     ED Results / Procedures / Treatments   Labs (all labs ordered are listed, but only abnormal results are displayed) Labs Reviewed  SARS CORONAVIRUS 2 BY RT PCR - Abnormal; Notable for the following components:      Result Value   SARS Coronavirus 2 by RT PCR POSITIVE (*)    All other components within normal limits  URINALYSIS, ROUTINE W REFLEX MICROSCOPIC - Abnormal; Notable for the following components:   Specific Gravity, Urine 1.033 (*)    Glucose, UA >=500 (*)    All other components within normal limits  COMPREHENSIVE METABOLIC PANEL - Abnormal; Notable for the following components:   Glucose, Bld 161 (*)    All other components within normal limits  CBC WITH DIFFERENTIAL/PLATELET - Abnormal; Notable for the following components:   WBC 12.5 (*)    Neutro Abs 10.0 (*)    Monocytes Absolute 1.3 (*)    All other components within  normal limits  CULTURE, BLOOD (ROUTINE X 2)  CULTURE, BLOOD (ROUTINE X 2)  URINE CULTURE  LACTIC ACID, PLASMA  LACTIC ACID, PLASMA    EKG None  Radiology DG Chest Port 1 View  Result Date: 03/14/2022 CLINICAL DATA:  Fever. EXAM: PORTABLE CHEST 1 VIEW COMPARISON:  March 17, 2017 FINDINGS: The heart size and mediastinal contours are within normal limits. There is stable elevation of the left hemidiaphragm. Mild atelectasis is again seen within the left lung base. There is no evidence of an acute infiltrate, pleural effusion or pneumothorax. The visualized skeletal structures are unremarkable.  IMPRESSION: Stable elevation of the left hemidiaphragm with mild left basilar atelectasis. Electronically Signed   By: Virgina Norfolk M.D.   On: 03/14/2022 18:45    Procedures Procedures    Medications Ordered in ED Medications  0.9 %  sodium chloride infusion ( Intravenous New Bag/Given 03/14/22 1829)    ED Course/ Medical Decision Making/ A&P                           Medical Decision Making Amount and/or Complexity of Data Reviewed Labs: ordered. Radiology: ordered.  Risk Prescription drug management.   Patient is testing positive for COVID.  Blood cultures were donned lactic acid was normal at 1.0.  Patient did receive some IV fluids.  Complete metabolic panel normal liver function test glucose of 161 no signs of acidosis CBC significant for a white count of 12.5 hemoglobin 12.8 platelets were normal.  Urinalysis negative for urinary tract infection.  Chest x-ray negative for any pneumonia.  Patient's symptoms consistent with COVID onset today.  Patient's renal function is normal.  Patient is a candidate for Paxlovid prescription provided.  Precautions provided over-the-counter symptomatic medications for flulike illness.  Patient without any respiratory distress.  Patient's oxygen sats are excellent blood pressures been fine.  No significant respiratory distress. Final Clinical  Impression(s) / ED Diagnoses Final diagnoses:  COVID    Rx / DC Orders ED Discharge Orders          Ordered    nirmatrelvir/ritonavir EUA (PAXLOVID) 20 x 150 MG & 10 x 100MG TABS  2 times daily        03/14/22 2044              Fredia Sorrow, MD 03/14/22 2048

## 2022-03-16 LAB — URINE CULTURE
Culture: <10000 — AB
Special Requests: NORMAL

## 2022-03-19 LAB — CULTURE, BLOOD (ROUTINE X 2)
Culture: NO GROWTH
Culture: NO GROWTH
Special Requests: ADEQUATE

## 2022-03-21 ENCOUNTER — Encounter (HOSPITAL_COMMUNITY): Payer: Medicare PPO

## 2022-03-26 ENCOUNTER — Encounter: Payer: Self-pay | Admitting: *Deleted

## 2022-04-05 DIAGNOSIS — Z01818 Encounter for other preprocedural examination: Secondary | ICD-10-CM | POA: Diagnosis not present

## 2022-04-05 DIAGNOSIS — M48062 Spinal stenosis, lumbar region with neurogenic claudication: Secondary | ICD-10-CM | POA: Diagnosis not present

## 2022-04-08 DIAGNOSIS — M5416 Radiculopathy, lumbar region: Secondary | ICD-10-CM | POA: Diagnosis not present

## 2022-04-08 DIAGNOSIS — Z981 Arthrodesis status: Secondary | ICD-10-CM | POA: Diagnosis not present

## 2022-04-08 DIAGNOSIS — M532X6 Spinal instabilities, lumbar region: Secondary | ICD-10-CM | POA: Diagnosis not present

## 2022-04-08 DIAGNOSIS — R079 Chest pain, unspecified: Secondary | ICD-10-CM | POA: Diagnosis not present

## 2022-04-08 DIAGNOSIS — M4326 Fusion of spine, lumbar region: Secondary | ICD-10-CM | POA: Diagnosis not present

## 2022-04-08 DIAGNOSIS — Z9889 Other specified postprocedural states: Secondary | ICD-10-CM | POA: Diagnosis not present

## 2022-04-08 DIAGNOSIS — M4316 Spondylolisthesis, lumbar region: Secondary | ICD-10-CM | POA: Diagnosis not present

## 2022-04-08 DIAGNOSIS — M48062 Spinal stenosis, lumbar region with neurogenic claudication: Secondary | ICD-10-CM | POA: Diagnosis not present

## 2022-04-08 DIAGNOSIS — M4716 Other spondylosis with myelopathy, lumbar region: Secondary | ICD-10-CM | POA: Diagnosis not present

## 2022-04-09 DIAGNOSIS — E1169 Type 2 diabetes mellitus with other specified complication: Secondary | ICD-10-CM | POA: Diagnosis not present

## 2022-04-09 DIAGNOSIS — M48061 Spinal stenosis, lumbar region without neurogenic claudication: Secondary | ICD-10-CM | POA: Diagnosis not present

## 2022-04-09 DIAGNOSIS — M4326 Fusion of spine, lumbar region: Secondary | ICD-10-CM | POA: Diagnosis not present

## 2022-04-09 DIAGNOSIS — M4716 Other spondylosis with myelopathy, lumbar region: Secondary | ICD-10-CM | POA: Diagnosis not present

## 2022-04-09 DIAGNOSIS — M961 Postlaminectomy syndrome, not elsewhere classified: Secondary | ICD-10-CM | POA: Diagnosis not present

## 2022-04-09 DIAGNOSIS — Z981 Arthrodesis status: Secondary | ICD-10-CM | POA: Diagnosis not present

## 2022-04-09 DIAGNOSIS — M4726 Other spondylosis with radiculopathy, lumbar region: Secondary | ICD-10-CM | POA: Diagnosis not present

## 2022-04-09 DIAGNOSIS — M4316 Spondylolisthesis, lumbar region: Secondary | ICD-10-CM | POA: Diagnosis not present

## 2022-04-10 DIAGNOSIS — M4716 Other spondylosis with myelopathy, lumbar region: Secondary | ICD-10-CM | POA: Diagnosis not present

## 2022-04-10 DIAGNOSIS — M4726 Other spondylosis with radiculopathy, lumbar region: Secondary | ICD-10-CM | POA: Diagnosis not present

## 2022-04-10 DIAGNOSIS — M4326 Fusion of spine, lumbar region: Secondary | ICD-10-CM | POA: Diagnosis not present

## 2022-04-10 DIAGNOSIS — M549 Dorsalgia, unspecified: Secondary | ICD-10-CM | POA: Diagnosis not present

## 2022-04-10 DIAGNOSIS — M48061 Spinal stenosis, lumbar region without neurogenic claudication: Secondary | ICD-10-CM | POA: Diagnosis not present

## 2022-04-10 DIAGNOSIS — E119 Type 2 diabetes mellitus without complications: Secondary | ICD-10-CM | POA: Diagnosis not present

## 2022-04-10 DIAGNOSIS — G894 Chronic pain syndrome: Secondary | ICD-10-CM | POA: Diagnosis not present

## 2022-04-10 DIAGNOSIS — Z743 Need for continuous supervision: Secondary | ICD-10-CM | POA: Diagnosis not present

## 2022-04-10 DIAGNOSIS — M961 Postlaminectomy syndrome, not elsewhere classified: Secondary | ICD-10-CM | POA: Diagnosis not present

## 2022-04-10 DIAGNOSIS — K5080 Crohn's disease of both small and large intestine without complications: Secondary | ICD-10-CM | POA: Diagnosis not present

## 2022-04-10 DIAGNOSIS — M4316 Spondylolisthesis, lumbar region: Secondary | ICD-10-CM | POA: Diagnosis not present

## 2022-04-10 DIAGNOSIS — R5381 Other malaise: Secondary | ICD-10-CM | POA: Diagnosis not present

## 2022-04-10 DIAGNOSIS — M4800 Spinal stenosis, site unspecified: Secondary | ICD-10-CM | POA: Diagnosis not present

## 2022-04-10 DIAGNOSIS — Z9889 Other specified postprocedural states: Secondary | ICD-10-CM | POA: Diagnosis not present

## 2022-04-10 DIAGNOSIS — Z981 Arthrodesis status: Secondary | ICD-10-CM | POA: Diagnosis not present

## 2022-04-10 DIAGNOSIS — K219 Gastro-esophageal reflux disease without esophagitis: Secondary | ICD-10-CM | POA: Diagnosis not present

## 2022-04-10 DIAGNOSIS — D638 Anemia in other chronic diseases classified elsewhere: Secondary | ICD-10-CM | POA: Diagnosis not present

## 2022-04-10 DIAGNOSIS — E559 Vitamin D deficiency, unspecified: Secondary | ICD-10-CM | POA: Diagnosis not present

## 2022-04-10 DIAGNOSIS — D649 Anemia, unspecified: Secondary | ICD-10-CM | POA: Diagnosis not present

## 2022-04-10 DIAGNOSIS — E1169 Type 2 diabetes mellitus with other specified complication: Secondary | ICD-10-CM | POA: Diagnosis not present

## 2022-04-10 DIAGNOSIS — E785 Hyperlipidemia, unspecified: Secondary | ICD-10-CM | POA: Diagnosis not present

## 2022-04-10 DIAGNOSIS — E118 Type 2 diabetes mellitus with unspecified complications: Secondary | ICD-10-CM | POA: Diagnosis not present

## 2022-04-10 DIAGNOSIS — I10 Essential (primary) hypertension: Secondary | ICD-10-CM | POA: Diagnosis not present

## 2022-04-10 DIAGNOSIS — M48062 Spinal stenosis, lumbar region with neurogenic claudication: Secondary | ICD-10-CM | POA: Diagnosis not present

## 2022-04-11 DIAGNOSIS — I10 Essential (primary) hypertension: Secondary | ICD-10-CM | POA: Diagnosis not present

## 2022-04-11 DIAGNOSIS — Z9889 Other specified postprocedural states: Secondary | ICD-10-CM | POA: Diagnosis not present

## 2022-04-11 DIAGNOSIS — G894 Chronic pain syndrome: Secondary | ICD-10-CM | POA: Diagnosis not present

## 2022-04-11 DIAGNOSIS — E119 Type 2 diabetes mellitus without complications: Secondary | ICD-10-CM | POA: Diagnosis not present

## 2022-04-16 DIAGNOSIS — M4800 Spinal stenosis, site unspecified: Secondary | ICD-10-CM | POA: Diagnosis not present

## 2022-04-16 DIAGNOSIS — Z9889 Other specified postprocedural states: Secondary | ICD-10-CM | POA: Diagnosis not present

## 2022-04-16 DIAGNOSIS — R5381 Other malaise: Secondary | ICD-10-CM | POA: Diagnosis not present

## 2022-04-17 DIAGNOSIS — G894 Chronic pain syndrome: Secondary | ICD-10-CM | POA: Diagnosis not present

## 2022-04-17 DIAGNOSIS — R5381 Other malaise: Secondary | ICD-10-CM | POA: Diagnosis not present

## 2022-04-17 DIAGNOSIS — I10 Essential (primary) hypertension: Secondary | ICD-10-CM | POA: Diagnosis not present

## 2022-04-17 DIAGNOSIS — E119 Type 2 diabetes mellitus without complications: Secondary | ICD-10-CM | POA: Diagnosis not present

## 2022-04-17 DIAGNOSIS — Z9889 Other specified postprocedural states: Secondary | ICD-10-CM | POA: Diagnosis not present

## 2022-04-22 ENCOUNTER — Encounter: Payer: Self-pay | Admitting: Family Medicine

## 2022-04-22 ENCOUNTER — Ambulatory Visit (INDEPENDENT_AMBULATORY_CARE_PROVIDER_SITE_OTHER): Payer: Medicare PPO | Admitting: Family Medicine

## 2022-04-22 ENCOUNTER — Telehealth: Payer: Self-pay | Admitting: Family Medicine

## 2022-04-22 VITALS — BP 139/77 | HR 96 | Temp 98.0°F | Ht 63.0 in | Wt 166.0 lb

## 2022-04-22 DIAGNOSIS — Z9889 Other specified postprocedural states: Secondary | ICD-10-CM

## 2022-04-22 DIAGNOSIS — M48062 Spinal stenosis, lumbar region with neurogenic claudication: Secondary | ICD-10-CM

## 2022-04-22 NOTE — Progress Notes (Signed)
BP 139/77   Pulse 96   Temp 98 F (36.7 C)   Ht 5' 3"  (1.6 m)   Wt 166 lb (75.3 kg)   SpO2 96%   BMI 29.41 kg/m    Subjective:   Patient ID: Felicia Pugh, female    DOB: 01/16/1946, 76 y.o.   MRN: [id number]  HPI: Felicia Pugh is a 76 y.o. female presenting on 04/22/2022 for S/P back surgery (Home PT 3x per week for the next 3w)   HPI Spinal stenosis status post lumbar surgery Patient is coming in after spinal stenosis status post lumbar surgery.   Patient was in rehab initially and now she has home PT 3 times a week for the next 3 weeks.  Patient says she is doing pulm rehab as of 4 days ago because she just got discharged from the facility.  She feels like she is doing better with it but is still just wondering when they will lighten the restrictions on what she can do.  She is still wearing a back brace and following with the surgeon as well.  She said the wound is still bandaged and they said do not remove the bandage until she works with the Psychologist, sport and exercise.  Been 2 weeks since the surgery.  She still is somewhat sore across her back but is feeling a lot better.  She says her blood sugar this morning was 105.  Relevant past medical, surgical, family and social history reviewed and updated as indicated. Interim medical history since our last visit reviewed. Allergies and medications reviewed and updated.  Review of Systems  Constitutional:  Negative for chills and fever.  Eyes:  Negative for visual disturbance.  Respiratory:  Negative for chest tightness and shortness of breath.   Cardiovascular:  Negative for chest pain and leg swelling.  Musculoskeletal:  Positive for arthralgias, back pain and gait problem.  Skin:  Negative for rash.  Neurological:  Negative for light-headedness and headaches.  Psychiatric/Behavioral:  Negative for agitation and behavioral problems.   All other systems reviewed and are negative.   Per HPI unless specifically indicated above   Allergies as  of 04/22/2022       Reactions   Metformin And Related    Severe diarrhea   Asa [aspirin]    GI Dr does not want her to take due to hx ulcers   Nsaids    GI Dr does not want her to take due to hx of ulcers   Other Diarrhea   Steroids   Tolmetin Other (See Comments)   GI Dr does not want her to take due to hx of ulcers GI Dr does not want her to take due to hx of ulcers        Medication List        Accurate as of April 22, 2022  9:22 AM. If you have any questions, ask your nurse or doctor.          atorvastatin 40 MG tablet Commonly known as: LIPITOR Take 1 tablet (40 mg total) by mouth daily.   balsalazide 750 MG capsule Commonly known as: COLAZAL Take 2 capsules (1,500 mg total) by mouth in the morning and at bedtime.   cholecalciferol 25 MCG (1000 UNIT) tablet Commonly known as: VITAMIN D3 Take 1,000 Units by mouth daily.   Glyxambi 25-5 MG Tabs Generic drug: Empagliflozin-linaGLIPtin Take 1 tablet by mouth daily.   Insulin Syringe-Needle U-100 31G X 1/4" 1 ML Misc 1 Syringe by  Does not apply route 2 (two) times daily.   loperamide 1 MG/5ML solution Commonly known as: IMODIUM Take 1 mg by mouth as needed for diarrhea or loose stools.   losartan 25 MG tablet Commonly known as: COZAAR Take 1 tablet (25 mg total) by mouth daily.   OneTouch Verio test strip Generic drug: glucose blood USE 1 STRIP TO CHECK GLUCOSE TWICE DAILY   Pen Needles 31G X 5 MM Misc Use to give insulin with flexpen   Tresiba FlexTouch 100 UNIT/ML FlexTouch Pen Generic drug: insulin degludec INJECT 40 UNITS SUBCUTANEOUSLY ONCE DAILY   vitamin C 250 MG tablet Commonly known as: ASCORBIC ACID Take 250 mg by mouth daily.   WOMENS 50+ MULTI VITAMIN/MIN PO Take by mouth.         Objective:   BP 139/77   Pulse 96   Temp 98 F (36.7 C)   Ht 5' 3"  (1.6 m)   Wt 166 lb (75.3 kg)   SpO2 96%   BMI 29.41 kg/m   Wt Readings from Last 3 Encounters:  04/22/22 166 lb (75.3  kg)  03/14/22 166 lb (75.3 kg)  02/21/22 166 lb (75.3 kg)    Physical Exam Vitals and nursing note reviewed.  Constitutional:      General: She is not in acute distress.    Appearance: She is well-developed. She is not diaphoretic.  Eyes:     Conjunctiva/sclera: Conjunctivae normal.  Cardiovascular:     Rate and Rhythm: Normal rate and regular rhythm.     Heart sounds: Normal heart sounds. No murmur heard. Pulmonary:     Effort: Pulmonary effort is normal. No respiratory distress.     Breath sounds: Normal breath sounds. No wheezing.  Skin:    General: Skin is warm and dry.     Findings: No rash.  Neurological:     Mental Status: She is alert and oriented to person, place, and time.     Coordination: Coordination normal.  Psychiatric:        Behavior: Behavior normal.       Assessment & Plan:   Problem List Items Addressed This Visit   None Visit Diagnoses     Spinal stenosis of lumbar region with neurogenic claudication    -  Primary   Status post laminectomy           Patient seems to be doing well on course, she still has a long ways to go but she feels like she is doing well as well.  No changes, continue with home physical therapy Follow up plan: Return if symptoms worsen or fail to improve.  Counseling provided for all of the vaccine components No orders of the defined types were placed in this encounter.   Caryl Pina, MD [facility] 04/22/2022, 9:22 AM

## 2022-04-25 ENCOUNTER — Ambulatory Visit (INDEPENDENT_AMBULATORY_CARE_PROVIDER_SITE_OTHER): Payer: Medicare PPO

## 2022-04-25 ENCOUNTER — Telehealth: Payer: Self-pay | Admitting: Family Medicine

## 2022-04-25 DIAGNOSIS — E785 Hyperlipidemia, unspecified: Secondary | ICD-10-CM | POA: Diagnosis not present

## 2022-04-25 DIAGNOSIS — G894 Chronic pain syndrome: Secondary | ICD-10-CM

## 2022-04-25 DIAGNOSIS — Z4789 Encounter for other orthopedic aftercare: Secondary | ICD-10-CM | POA: Diagnosis not present

## 2022-04-25 DIAGNOSIS — M1612 Unilateral primary osteoarthritis, left hip: Secondary | ICD-10-CM | POA: Diagnosis not present

## 2022-04-25 DIAGNOSIS — E1140 Type 2 diabetes mellitus with diabetic neuropathy, unspecified: Secondary | ICD-10-CM

## 2022-04-25 DIAGNOSIS — K5090 Crohn's disease, unspecified, without complications: Secondary | ICD-10-CM | POA: Diagnosis not present

## 2022-04-25 DIAGNOSIS — I10 Essential (primary) hypertension: Secondary | ICD-10-CM

## 2022-04-25 DIAGNOSIS — M48062 Spinal stenosis, lumbar region with neurogenic claudication: Secondary | ICD-10-CM

## 2022-04-25 DIAGNOSIS — M4716 Other spondylosis with myelopathy, lumbar region: Secondary | ICD-10-CM

## 2022-04-25 DIAGNOSIS — F32A Depression, unspecified: Secondary | ICD-10-CM

## 2022-04-25 DIAGNOSIS — F419 Anxiety disorder, unspecified: Secondary | ICD-10-CM

## 2022-04-26 NOTE — Telephone Encounter (Signed)
FYI

## 2022-04-29 ENCOUNTER — Encounter: Payer: Self-pay | Admitting: *Deleted

## 2022-04-30 ENCOUNTER — Ambulatory Visit: Payer: Medicare PPO | Admitting: Internal Medicine

## 2022-05-14 DIAGNOSIS — B351 Tinea unguium: Secondary | ICD-10-CM | POA: Diagnosis not present

## 2022-05-14 DIAGNOSIS — L84 Corns and callosities: Secondary | ICD-10-CM | POA: Diagnosis not present

## 2022-05-14 DIAGNOSIS — E1142 Type 2 diabetes mellitus with diabetic polyneuropathy: Secondary | ICD-10-CM | POA: Diagnosis not present

## 2022-05-14 DIAGNOSIS — M79676 Pain in unspecified toe(s): Secondary | ICD-10-CM | POA: Diagnosis not present

## 2022-05-15 DIAGNOSIS — M4326 Fusion of spine, lumbar region: Secondary | ICD-10-CM | POA: Diagnosis not present

## 2022-05-15 DIAGNOSIS — M5416 Radiculopathy, lumbar region: Secondary | ICD-10-CM | POA: Diagnosis not present

## 2022-05-15 DIAGNOSIS — M48062 Spinal stenosis, lumbar region with neurogenic claudication: Secondary | ICD-10-CM | POA: Diagnosis not present

## 2022-05-15 DIAGNOSIS — Z981 Arthrodesis status: Secondary | ICD-10-CM | POA: Diagnosis not present

## 2022-05-15 DIAGNOSIS — M4316 Spondylolisthesis, lumbar region: Secondary | ICD-10-CM | POA: Diagnosis not present

## 2022-05-16 ENCOUNTER — Encounter (HOSPITAL_COMMUNITY): Payer: Medicare PPO

## 2022-05-21 ENCOUNTER — Telehealth: Payer: Self-pay | Admitting: Family Medicine

## 2022-05-21 NOTE — Telephone Encounter (Signed)
[place] 1 time a week for 2 weeks

## 2022-05-22 NOTE — Telephone Encounter (Signed)
Verbal order given to [facility]. Pt was seen by OT last week and will be seen one more time next week.

## 2022-05-22 NOTE — Telephone Encounter (Signed)
Unless she is already connected into home health she will need a face-to-face visit for this order.  If she is already connected into home health then go ahead and give the verbal order for it.

## 2022-05-27 ENCOUNTER — Ambulatory Visit (INDEPENDENT_AMBULATORY_CARE_PROVIDER_SITE_OTHER): Payer: Medicare PPO | Admitting: Gastroenterology

## 2022-05-27 ENCOUNTER — Encounter: Payer: Self-pay | Admitting: Gastroenterology

## 2022-05-27 VITALS — BP 144/73 | HR 93 | Temp 97.8°F | Ht 62.5 in | Wt 163.4 lb

## 2022-05-27 DIAGNOSIS — K5080 Crohn's disease of both small and large intestine without complications: Secondary | ICD-10-CM

## 2022-05-27 NOTE — Progress Notes (Signed)
GI Office Note    Referring Provider: Dettinger, Fransisca Kaufmann, MD Primary Care Physician:  Dettinger, Fransisca Kaufmann, MD  Primary Gastroenterologist: Garfield Cornea, MD  Chief Complaint   Chief Complaint  Patient presents with   Follow-up    Doing well.     History of Present Illness   Felicia Pugh is a 76 y.o. female presenting today for follow-up of Crohn's disease.  Last seen in April 2023. 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 Also with history of osteopenia noted on DEXA scan in 2018, but normal in 2022, presenting today for routine follow-up.   Since her last ov, she has had her fourth back surgery (end of 03/2022). She is still on pain medication. Prior to surgery she was having BM 1-2 times per day, sometimes stools loose. Since on pain medication, she is more constipated. She takes miralax if no BM in 3 days. Works same day for her. She has been reluctant to take miralax daily due to tendency to have diarrhea. No melena, brbpr. No abdominal pain. No heartburn.   Colonoscopy October 2018: - abnormal ileocecal valve?biopsied, focal active ileitis on biopsy - Diverticulosis in the sigmoid colon and in the descending colon. The examination was otherwise normal on direct and retroflexion views. - Non-bleeding internal hemorrhoids. -5 year surveillance colonoscopy recommended  Medications   Current Outpatient Medications  Medication Sig Dispense Refill   atorvastatin (LIPITOR) 40 MG tablet Take 1 tablet (40 mg total) by mouth daily. 90 tablet 3   balsalazide (COLAZAL) 750 MG capsule Take 2 capsules (1,500 mg total) by mouth in the morning and at bedtime. 360 capsule 3   Empagliflozin-linaGLIPtin (GLYXAMBI) 25-5  MG TABS Take 1 tablet by mouth daily. 90 tablet 3   glucose blood (ONETOUCH VERIO) test strip USE 1 STRIP TO CHECK GLUCOSE TWICE DAILY 200 each 2   HYDROcodone-acetaminophen (NORCO) 10-325 MG tablet Take 1 tablet by mouth every 4 (four) hours as needed.     insulin degludec (TRESIBA FLEXTOUCH) 100 UNIT/ML FlexTouch Pen INJECT 40 UNITS SUBCUTANEOUSLY ONCE DAILY 30 mL 1   Insulin Pen Needle (PEN NEEDLES) 31G X 5 MM MISC Use to give insulin with flexpen 100 each 1   Insulin Syringe-Needle U-100 31G X 1/4" 1 ML MISC 1 Syringe by Does not apply route 2 (two) times daily. 60 each 6   losartan (COZAAR) 25 MG tablet Take 1 tablet (25 mg total) by mouth daily. 90 tablet 3   methocarbamol (ROBAXIN) 500 MG tablet Take 500 mg by mouth every 8 (eight) hours as needed.     Multiple Vitamins-Minerals (WOMENS 50+ MULTI VITAMIN/MIN PO) Take by mouth.     No current facility-administered medications for this visit.    Allergies   Allergies as of 05/27/2022 - Review Complete 05/27/2022  Allergen Reaction Noted   Metformin and related  05/11/2014   Asa [aspirin]  11/04/2012   Nsaids Other (See Comments) 11/04/2012   Other Diarrhea 12/25/2014   Tolmetin Other (See Comments) 11/04/2012      Review of Systems   General: Negative for anorexia, weight loss, fever, chills, fatigue, weakness. ENT: Negative for hoarseness, difficulty swallowing , nasal congestion. CV: Negative for chest pain, angina, palpitations, dyspnea on exertion, peripheral edema.  Respiratory: Negative for dyspnea  at rest, dyspnea on exertion, cough, sputum, wheezing.  GI: See history of present illness. GU:  Negative for dysuria, hematuria, urinary incontinence, urinary frequency, nocturnal urination.  Endo: Negative for unusual weight change.     Physical Exam   BP (!) 144/73 (BP Location: Right Arm, Patient Position: Sitting, Cuff Size: Normal)   Pulse 93   Temp 97.8 F (36.6 C) (Oral)   Ht 5' 2.5" (1.588 m)   Wt 163 lb 6.4 oz  (74.1 kg)   SpO2 98%   BMI 29.41 kg/m    General: Well-nourished, well-developed in no acute distress.  Eyes: No icterus. Mouth: Oropharyngeal mucosa moist and pink , no lesions erythema or exudate. Lungs: Clear to auscultation bilaterally.  Heart: Regular rate and rhythm, no murmurs rubs or gallops.  Abdomen: Bowel sounds are normal, nontender, nondistended, no hepatosplenomegaly or masses,  no abdominal bruits or hernia , no rebound or guarding.  Rectal: not performed Extremities: trace lower extremity edema. No clubbing or deformities. Neuro: Alert and oriented x 4   Skin: Warm and dry, no jaundice.   Psych: Alert and cooperative, normal mood and affect.  Labs   Labs from April 09, 2022: BUN 13, creatinine 0.61, hemoglobin 10.3, hematocrit 32.7, platelets 201,000, white blood cell count 16,200.  Postop back surgery  March 14, 2022: Hemoglobin 12.8  Imaging Studies   No results found.  Assessment   Crohn's: ileocolonic, mostly involving ICV. Doing well on Colazal two capsule BID. Recent creatinine normal. Stools less frequent on hydrocodone so she is using miralax as needed. Dr. Gala Romney advised for 5 year follow up colonoscopy so she is due at this time, but given recent back surgery, it will be postponed.     PLAN   Continue colazaol 1.5 gram bid.  Return ov in 09/2022 and plan for colonoscopy after that visit.     Laureen Ochs. Bobby Rumpf, [place], [facility]

## 2022-05-27 NOTE — Patient Instructions (Signed)
Continue your Colazal 2 capsules twice daily.  Continue miralax 1/2 to 1 capful if you go more than 48 hours without a bowel movement.  Return to the office in 09/2022. We will schedule you for your colonoscopy at that time.

## 2022-05-28 NOTE — Progress Notes (Deleted)
[facility]           OF   History and Physical   Felicia Pugh is a 76 y.o. female who presents with ***  ***  The pt does *** have hx of previous venous procedures. The patient has *** history of DVT. Pt does *** history of varicose vein.   Pt does *** history of skin changes in lower legs.   There is *** family history of venous disorders.   The patient has *** used compression stockings in the past.    The pt is on a statin for cholesterol management.  The pt is not on a daily aspirin.   Other AC:  none The pt is on ARB for hypertension.   The pt is diabetic.   Tobacco hx:  never  Pt does *** have family hx of AAA.  Past Medical History:  Diagnosis Date   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 Sonny Dandy, no longer seeing anyone (02/2015)   Cataract    Complication of anesthesia    difficulty breathing after waking up from anesthesia   Crohn's disease ([place]) 2016   diagnosed in 2016 and started on Colazal, but inflammation/ulceration in ileum/cecum dates back to 2006.   Diabetes mellitus without complication ([place])    Diabetic neuropathy ([place])    Hyperlipidemia    Hypertension    IBS (irritable bowel syndrome)    Stroke ([place]) 08/12/2009   Syncope and collapse    Thyroid nodule    Ulcer     Past Surgical History:  Procedure Laterality Date   ABDOMINAL HYSTERECTOMY     partial   BACK SURGERY     x3 (Dr Loyola Mast, Dr Patrice Paradise)   [place] SURGERY  03/2022   BREAST SURGERY Right 2002   cyst removed   COLONOSCOPY  2006   RMR: Normal colon except for ileocecal valve, eroded/ulcerated areas at ileocecal valve with mucosal hemorrhage present status post biopsy. ulcer (ileocecal valve, biopsy): Chronic active inflammation with ulceration, nonspecific.   COLONOSCOPY  09/06/2009   RMR: Suboptimal prep on the right side. Ulcers noted at IC valve, base of cecum, and scattered through food part of ascending colon. Pathology wiht  chronic active colitis, question Crohn's   COLONOSCOPY N/A 03/27/2015   Dr. Gala Romney: abnormal cecum and IC valve most consistent with IBD. TI intubated and appeared normal. Likely Crohn's disease. Patient denies NSAID use. No improvement with Lialda samples at time of initial diagnosis   COLONOSCOPY N/A 05/15/2017   Dr. Gala Romney: ab normal IC valve, s/p biopsy with active ileitis, diverticulosis in sigmoid and descending colon. Non-bleeding internal hemorrhoids   ESOPHAGOGASTRODUODENOSCOPY     HAND SURGERY Right    Carpel tunnel and right elbow   LAPAROSCOPIC APPENDECTOMY N/A 07/21/2016   Procedure: APPENDECTOMY LAPAROSCOPIC;  Surgeon: Aviva Signs, MD;  Location: [facility];  Service: General;  Laterality: N/A;   Small bowel capsule endoscopy  06/10/2005   RMR: single erosion versus AVM, distal ileum   Small bowel capsule endoscopy  prior to 05/2005   [place]: reported erosions/ulcerations per medical record. actual report unavailable.    Social History   Socioeconomic History   Marital status: Married    Spouse name: Herbie Baltimore   Number of children: 5   Years of education: 9th grade-then got her GED at [employer]   Highest education level: GED or equivalent  Occupational History   Occupation: retired  Tobacco  Use   Smoking status: Never   Smokeless tobacco: Never  Vaping Use   Vaping Use: Never used  Substance and Sexual Activity   Alcohol use: No   Drug use: No   Sexual activity: Not Currently  Other Topics Concern   Not on file  Social History Narrative   Lives home with her husband - he is on dialysis - she helps care for him   Their children live nearby   She babysits her 41 year old grandchild   Social Determinants of Health   Financial Resource Strain: [place]  (05/30/2021)   Overall Financial Resource Strain (CARDIA)    Difficulty of Paying Living Expenses: Not hard at all  Food Insecurity: No Food Insecurity (05/30/2021)   Hunger Vital Sign    Worried About Running Out of Food in  the Last Year: Never true    [place] in the Last Year: Never true  Transportation Needs: No Transportation Needs (05/30/2021)   PRAPARE - Hydrologist (Medical): No    Lack of Transportation (Non-Medical): No  Physical Activity: Insufficiently Active (05/30/2021)   Exercise Vital Sign    Days of Exercise per Week: 7 days    Minutes of Exercise per Session: 20 min  Stress: No Stress Concern Present (05/30/2021)   [place] of Stress : Not at all  Social Connections: [place] (05/30/2021)   Social Connection and Isolation Panel [NHANES]    Frequency of Communication with Friends and Family: More than three times a week    Frequency of Social Gatherings with Friends and Family: More than three times a week    Attends Religious Services: More than 4 times per year    Active Member of [employer] or Organizations: Yes    Attends Music therapist: More than 4 times per year    Marital Status: Married  Human resources officer Violence: Not At Risk (05/30/2021)   Humiliation, Afraid, Rape, and Kick questionnaire    Fear of Current or Ex-Partner: No    Emotionally Abused: No    Physically Abused: No    Sexually Abused: No    *** Family History  Problem Relation Age of Onset   Hypertension Mother    Stroke Mother    Diabetes Father        w/ BKA   Hypertension Father    Asthma Father    Cancer Sister    Diabetes Sister        stomach   Breast cancer Sister    Diabetes Brother    Diabetes Sister    Cancer Sister        breast   Hypertension Sister    Hypertension Daughter    Hypertension Son    Multiple sclerosis Son    Colon cancer Neg Hx    Inflammatory bowel disease Neg Hx     Current Outpatient Medications  Medication Sig Dispense Refill   atorvastatin (LIPITOR) 40 MG tablet Take 1 tablet (40 mg total) by mouth daily. 90 tablet 3   balsalazide  (COLAZAL) 750 MG capsule Take 2 capsules (1,500 mg total) by mouth in the morning and at bedtime. 360 capsule 3   Empagliflozin-linaGLIPtin (GLYXAMBI) 25-5 MG TABS Take 1 tablet by mouth daily. 90 tablet 3   glucose blood (ONETOUCH VERIO) test strip USE 1 STRIP TO CHECK GLUCOSE TWICE DAILY 200 each 2   HYDROcodone-acetaminophen (  NORCO) 10-325 MG tablet Take 1 tablet by mouth every 4 (four) hours as needed.     insulin degludec (TRESIBA FLEXTOUCH) 100 UNIT/ML FlexTouch Pen INJECT 40 UNITS SUBCUTANEOUSLY ONCE DAILY 30 mL 1   Insulin Pen Needle (PEN NEEDLES) 31G X 5 MM MISC Use to give insulin with flexpen 100 each 1   Insulin Syringe-Needle U-100 31G X 1/4" 1 ML MISC 1 Syringe by Does not apply route 2 (two) times daily. 60 each 6   losartan (COZAAR) 25 MG tablet Take 1 tablet (25 mg total) by mouth daily. 90 tablet 3   methocarbamol (ROBAXIN) 500 MG tablet Take 500 mg by mouth every 8 (eight) hours as needed.     Multiple Vitamins-Minerals (WOMENS 50+ MULTI VITAMIN/MIN PO) Take by mouth.     No current facility-administered medications for this visit.    Allergies  Allergen Reactions   Metformin And Related     Severe diarrhea   Asa [Aspirin]     GI Dr does not want her to take due to hx ulcers   Nsaids Other (See Comments)    GI Dr does not want her to take due to hx of ulcers Instructed by GI not to take    Other Diarrhea    Steroids   Tolmetin Other (See Comments)    GI Dr does not want her to take due to hx of ulcers GI Dr does not want her to take due to hx of ulcers     REVIEW OF SYSTEMS:  *** [X]  denotes positive finding, [ ]  denotes negative finding Cardiac  Comments:  Chest pain or chest pressure:    Shortness of breath upon exertion:    Short of breath when lying flat:    Irregular heart rhythm:        Vascular    Pain in calf, thigh, or hip brought on by ambulation:    Pain in feet at night that wakes you up from your sleep:     Blood clot in your veins:    Leg  swelling:  x       Pulmonary    Oxygen at home:    Productive cough:     Wheezing:         Neurologic    Sudden weakness in arms or legs:     Sudden numbness in arms or legs:     Sudden onset of difficulty speaking or slurred speech:    Temporary loss of vision in one eye:     Problems with dizziness:         Gastrointestinal    Blood in stool:     Vomited blood:         Genitourinary    Burning when urinating:     Blood in urine:        Psychiatric    Major depression:         Hematologic    Bleeding problems:    Problems with blood clotting too easily:        Skin    Rashes or ulcers:        Constitutional    Fever or chills:      PHYSICAL EXAMINATION:  ***  General:  WDWN in NAD; vital signs documented above Gait: Not observed HENT: WNL, normocephalic Pulmonary: normal non-labored breathing without wheezing Cardiac: {Desc; regular/irreg:14544} HR; {With/Without:20273} carotid bruit*** Abdomen: soft, NT, aortic pulse is *** palpable Skin: {With/Without:20273} rashes Vascular Exam/Pulses:  Right Left  Radial {  Exam; arterial pulse strength 0-4:30167} {Exam; arterial pulse strength 0-4:30167}  DP {Exam; arterial pulse strength 0-4:30167} {Exam; arterial pulse strength 0-4:30167}  PT {Exam; arterial pulse strength 0-4:30167} {Exam; arterial pulse strength 0-4:30167}   Extremities: ***  Neurologic: A&O X 3;  moving all extremities equally Psychiatric:  The pt has {Desc; normal/abnormal:11317::"Normal"} affect.   Non-Invasive Vascular Imaging:   Venous duplex on 05/30/2022: ***    Felicia Pugh is a 76 y.o. female who presents with: ***    -pt has *** pedal pulses -pt does *** have evidence of DVT.  Pt does ***have venous reflux *** -discussed with pt about wearing *** high *** mmHg compression stockings and pt was measured for these today.   *** -discussed the importance of leg elevation and how to elevate properly - pt is advised to elevate their  legs and a diagram is given to them to demonstrate for pt to lay flat on their back with knees elevated and slightly bent with their feet higher than their knees, which puts their feet higher than their heart for 15 minutes per day.  If pt cannot lay flat, advised to lay as flat as possible.  -pt is advised to continue as much walking as possible and avoid sitting or standing for long periods of time.  -discussed importance of weight loss and exercise and that water aerobics would also be beneficial.  -handout with recommendations given -pt will f/u ***   Leontine Locket, [facility] [phone]  Clinic MD:  ***

## 2022-05-30 ENCOUNTER — Ambulatory Visit: Admitting: Family Medicine

## 2022-05-30 ENCOUNTER — Ambulatory Visit (HOSPITAL_COMMUNITY): Payer: Medicare PPO

## 2022-05-31 ENCOUNTER — Ambulatory Visit (INDEPENDENT_AMBULATORY_CARE_PROVIDER_SITE_OTHER): Payer: Medicare PPO | Admitting: *Deleted

## 2022-05-31 DIAGNOSIS — Z0000 Encounter for general adult medical examination without abnormal findings: Secondary | ICD-10-CM | POA: Diagnosis not present

## 2022-05-31 NOTE — Progress Notes (Signed)
Subjective:   Felicia Pugh is a 76 y.o. female who presents for Medicare Annual (Subsequent) preventive examination. I connected with  [name] on 05/31/22 by a audio enabled telemedicine application and verified that I am speaking with the correct person using two identifiers.  Patient Location: Home  Provider Location: Office/Clinic  I discussed the limitations of evaluation and management by telemedicine. The patient expressed understanding and agreed to proceed.  Review of Systems    Defer to PCP  Cardiac Risk Factors include: diabetes mellitus;hypertension;dyslipidemia     Objective:    Today's Vitals   05/31/22 1038  PainSc: 5    There is no height or weight on file to calculate BMI.     05/31/2022   11:03 AM 03/14/2022    6:06 PM 12/12/2021    1:00 PM 05/30/2021   10:32 AM 05/24/2020   10:37 AM 05/24/2019   10:43 AM 05/19/2018   11:41 AM  Advanced Directives  Does Patient Have a Medical Advance Directive? No No Yes No No;Yes Yes Yes  Type of Tax inspector of [employer] Power of Attorney  Does patient want to make changes to medical advance directive?     No - Patient declined No - Patient declined Yes (MAU/Ambulatory/Procedural Areas - Information given)  Copy of [place] in Chart?     No - copy requested No - copy requested No - copy requested  Would patient like information on creating a medical advance directive? No - Patient declined   No - Patient declined No - Patient declined      Current Medications (verified) Outpatient Encounter Medications as of 05/31/2022  Medication Sig   atorvastatin (LIPITOR) 40 MG tablet Take 1 tablet (40 mg total) by mouth daily.   balsalazide (COLAZAL) 750 MG capsule Take 2 capsules (1,500 mg total) by mouth in the morning and at bedtime.   ciclopirox (PENLAC) 8 % solution APPLY EVERY DAY TO SPLIT TOENAIL ON RIGHT BIG TOE. CLEANSE WITH NAIL  POLISH REMOVER ONCE A WEEK.   Empagliflozin-linaGLIPtin (GLYXAMBI) 25-5 MG TABS Take 1 tablet by mouth daily.   glucose blood (ONETOUCH VERIO) test strip USE 1 STRIP TO CHECK GLUCOSE TWICE DAILY   HYDROcodone-acetaminophen (NORCO) 10-325 MG tablet Take 1 tablet by mouth every 4 (four) hours as needed.   insulin degludec (TRESIBA FLEXTOUCH) 100 UNIT/ML FlexTouch Pen INJECT 40 UNITS SUBCUTANEOUSLY ONCE DAILY   Insulin Pen Needle (PEN NEEDLES) 31G X 5 MM MISC Use to give insulin with flexpen   Insulin Syringe-Needle U-100 31G X 1/4" 1 ML MISC 1 Syringe by Does not apply route 2 (two) times daily.   losartan (COZAAR) 25 MG tablet Take 1 tablet (25 mg total) by mouth daily.   methocarbamol (ROBAXIN) 500 MG tablet Take 500 mg by mouth every 8 (eight) hours as needed.   Multiple Vitamins-Minerals (WOMENS 50+ MULTI VITAMIN/MIN PO) Take by mouth.   loperamide (IMODIUM) 2 MG capsule Take by mouth.   nystatin powder APPLY POWDER TOPICALLY TO AFFECTED AREA THREE TIMES DAILY   No facility-administered encounter medications on file as of 05/31/2022.    Allergies (verified) Metformin and related, Asa [aspirin], Nsaids, Other, and Tolmetin   History: Past Medical History:  Diagnosis Date   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 Sonny Dandy, no longer seeing anyone (02/2015)   Cataract    Complication of anesthesia    difficulty  breathing after waking up from anesthesia   Crohn's disease ([place]) 2016   diagnosed in 2016 and started on Colazal, but inflammation/ulceration in ileum/cecum dates back to 2006.   Diabetes mellitus without complication ([place])    Diabetic neuropathy ([place])    Hyperlipidemia    Hypertension    IBS (irritable bowel syndrome)    Stroke ([place]) 08/12/2009   Syncope and collapse    Thyroid nodule    Ulcer    Past Surgical History:  Procedure Laterality Date   ABDOMINAL HYSTERECTOMY     partial   BACK SURGERY     x3 (Dr Loyola Mast, Dr Patrice Paradise)   [place] SURGERY  03/2022    BREAST SURGERY Right 2002   cyst removed   COLONOSCOPY  2006   RMR: Normal colon except for ileocecal valve, eroded/ulcerated areas at ileocecal valve with mucosal hemorrhage present status post biopsy. ulcer (ileocecal valve, biopsy): Chronic active inflammation with ulceration, nonspecific.   COLONOSCOPY  09/06/2009   RMR: Suboptimal prep on the right side. Ulcers noted at IC valve, base of cecum, and scattered through food part of ascending colon. Pathology wiht chronic active colitis, question Crohn's   COLONOSCOPY N/A 03/27/2015   Dr. Gala Romney: abnormal cecum and IC valve most consistent with IBD. TI intubated and appeared normal. Likely Crohn's disease. Patient denies NSAID use. No improvement with L[place]da samples at time of initial diagnosis   COLONOSCOPY N/A 05/15/2017   Dr. Gala Romney: ab normal IC valve, s/p biopsy with active ileitis, diverticulosis in sigmoid and descending colon. Non-bleeding internal hemorrhoids   ESOPHAGOGASTRODUODENOSCOPY     HAND SURGERY Right    Carpel tunnel and right elbow   LAPAROSCOPIC APPENDECTOMY N/A 07/21/2016   Procedure: APPENDECTOMY LAPAROSCOPIC;  Surgeon: Aviva Signs, MD;  Location: [facility];  Service: General;  Laterality: N/A;   Small bowel capsule endoscopy  06/10/2005   RMR: single erosion versus AVM, distal ileum   Small bowel capsule endoscopy  prior to 05/2005   [place]: reported erosions/ulcerations per medical record. actual report unavailable.   Family History  Problem Relation Age of Onset   Hypertension Mother    Stroke Mother    Diabetes Father        w/ BKA   Hypertension Father    Asthma Father    Cancer Sister    Diabetes Sister        stomach   Breast cancer Sister    Diabetes Sister    Cancer Sister        breast   Hypertension Sister    Diabetes Brother    Hypertension Daughter    Hypertension Son    Multiple sclerosis Son    Colon cancer Neg Hx    Inflammatory bowel disease Neg Hx    Social History   Socioeconomic  History   Marital status: Married    Spouse name: Herbie [place]   Number of children: 5   Years of education: 9th grade-then got her GED at [facility]   Highest education level: GED or equivalent  Occupational History   Occupation: retired  Tobacco Use   Smoking status: Never   Smokeless tobacco: Never  Vaping Use   Vaping Use: Never used  Substance and Sexual Activity   Alcohol use: No   Drug use: No   Sexual activity: Not Currently  Other Topics Concern   Not on file  Social History Narrative   Lives home with her husband - he is on dialysis - she helps care for him  Their children live nearby   She babysits her 54 year old grandchild   Social Determinants of Health   Financial Resource Strain: Low Risk  (05/31/2022)   Overall Financial Resource Strain (CARDIA)    Difficulty of Paying Living Expenses: Not hard at all  Food Insecurity: No Food Insecurity (05/31/2022)   Hunger Vital Sign    Worried About Running Out of Food in the Last Year: Never true    Ran Out of Food in the Last Year: Never true  Transportation Needs: No Transportation Needs (05/31/2022)   PRAPARE - Hydrologist (Medical): No    Lack of Transportation (Non-Medical): No  Physical Activity: Sufficiently Active (05/31/2022)   Exercise Vital Sign    Days of Exercise per Week: 7 days    Minutes of Exercise per Session: 60 min  Stress: No Stress Concern Present (05/31/2022)   [place]    Feeling of Stress : Not at all  Social Connections: [place] (05/31/2022)   Social Connection and Isolation Panel [NHANES]    Frequency of Communication with Friends and Family: More than three times a week    Frequency of Social Gatherings with Friends and Family: Twice a week    Attends Religious Services: More than 4 times per year    Active Member of [employer] or Organizations: Yes    Attends Music therapist: More  than 4 times per year    Marital Status: Married    Tobacco Counseling Counseling given: Not Answered   Clinical Intake:  Pre-visit preparation completed: Yes  Pain : 0-10 Pain Score: 5  Pain Type: Chronic pain Pain Location: Back Pain Orientation: Medial, Lower Pain Descriptors / Indicators: Sharp, Numbness Pain Onset: Other (comment) (since surgery August the 28th) Pain Frequency: Constant Pain Relieving Factors: medication, lying down can ease pain Effect of Pain on Daily Activities: moderate  Pain Relieving Factors: medication, lying down can ease pain  Diabetes: Yes CBG done?: No Did pt. bring in CBG monitor from home?: No  How often do you need to have someone help you when you read instructions, pamphlets, or other written materials from your doctor or pharmacy?: 2 - Rarely What is the last grade level you completed in school?: 10th grade (did receive GED)  Diabetic?Yes  Interpreter Needed?: No      Activities of Daily Living    05/31/2022   11:06 AM  In your present state of health, do you have any difficulty performing the following activities:  Hearing? 1  Comment light stroke, ringing in the right ear  Vision? 0  Comment corrected vision  Difficulty concentrating or making decisions? 0  Walking or climbing stairs? 0  Dressing or bathing? 0  Doing errands, shopping? 0  Preparing Food and eating ? N  Using the Toilet? N  In the past six months, have you accidently leaked urine? N  Do you have problems with loss of bowel control? N  Managing your Medications? N  Managing your Finances? N  Housekeeping or managing your Housekeeping? N    Patient Care Team: Dettinger, Fransisca Kaufmann, MD as PCP - General (Family Medicine) Gala Romney Cristopher Estimable, MD as Consulting Physician (Gastroenterology) Annitta Needs, NP (Gastroenterology) Verner Mould, MD as Consulting Physician (Orthopedic Surgery) Steffanie Rainwater, DPM as Consulting Physician (Podiatry) Suella Broad, MD as Consulting Physician (Physical Medicine and Rehabilitation)  Indicate any recent Medical Services you may have received  from other than [facility] providers in the past year (date may be approximate).     Assessment:   This is a routine wellness examination for Katja.  Hearing/Vision screen No results found.  Dietary issues and exercise activities discussed: Current Exercise Habits: Home exercise routine, Type of exercise: walking, Time (Minutes): 30, Frequency (Times/Week): 7, Weekly Exercise (Minutes/Week): 210, Intensity: Moderate, Exercise limited by: Other - see comments (back surgery)   Goals Addressed             This Visit's Progress    Patient Stated       05/31/2022 AWV Goal: Improved Nutrition/Diet  Patient will verbalize understanding that diet plays an important role in overall health and that a poor diet is a risk factor for many chronic medical conditions.  Over the next year, patient will improve self management of their diet by incorporating better food choices. Patient will utilize available community resources to help with food acquisition if needed (ex: food pantries, [address], etc) Patient will work with nutrition specialist if a referral was made        Depression Screen    05/31/2022   11:01 AM 04/22/2022    9:12 AM 02/21/2022    8:09 AM 11/19/2021   12:11 PM 11/08/2021    8:52 AM 08/03/2021    8:33 AM 07/02/2021    1:12 PM  PHQ 2/9 Scores  PHQ - 2 Score 0 0 0 0 0 0 0  PHQ- 9 Score  0  3 1      Fall Risk    04/22/2022    9:12 AM 02/21/2022    8:09 AM 11/19/2021   12:11 PM 11/08/2021    8:52 AM 08/03/2021    8:33 AM  [place] in the past year? 0 0 1 0 0  Number falls in past yr:   1    Injury with Fall?   1    Follow up   Falls prevention discussed      FALL RISK PREVENTION PERTAINING TO THE HOME:  Any stairs in or around the home? Yes  If so, are there any without handrails? Yes  Home free of loose throw rugs in  walkways, pet beds, electrical cords, etc? Yes  Adequate lighting in your home to reduce risk of falls? Yes   ASSISTIVE DEVICES UTILIZED TO PREVENT FALLS:  Life alert? No  Use of a cane, walker or w/c? Yes  Grab bars in the bathroom? No  Shower chair or bench in shower? Yes  Elevated toilet seat or a handicapped toilet? Yes   Cognitive Function:    05/19/2018   11:44 AM 05/01/2017   11:09 AM  MMSE - Mini Mental State Exam  Orientation to time 5 5  Orientation to Place 5 5  Registration 3 3  Attention/ Calculation 4 4  Recall 2 2  Language- name 2 objects 2 2  Language- repeat 1 1  Language- follow 3 step command 3 3  Language- read & follow direction 1 1  Write a sentence 1 1  Copy design 1 1  Total score 28 28        05/31/2022   11:09 AM 05/24/2020   10:40 AM 05/24/2019   10:49 AM  6CIT Screen  What Year? 0 points 0 points 0 points  What month? 0 points 0 points 0 points  What time? 0 points 0 points 0 points  Count back from 20 0 points 0 points 0  points  Months in reverse 0 points 0 points 0 points  Repeat phrase  0 points 0 points  Total Score  0 points 0 points    Immunizations Immunization History  Administered Date(s) Administered   Fluad Quad(high Dose 65+) 05/01/2020, 05/04/2021   Influenza, High Dose Seasonal PF 05/19/2018, 04/24/2019   Influenza,inj,Quad PF,6+ Mos 05/13/2013, 05/27/2014, 06/22/2015, 06/11/2016, 06/26/2017   Moderna Sars-Covid-2 Vaccination 09/23/2019, 10/26/2019, 06/26/2020, 12/27/2020   Pneumococcal Conjugate-13 07/08/2014   Pneumococcal Polysaccharide-23 12/16/2016   Tdap 12/30/2017   Zoster Recombinat (Shingrix) 07/14/2019, 12/12/2021    TDAP status: Up to date  Flu Vaccine status: Up to date  Pneumococcal vaccine status: Up to date  Covid-19 vaccine status: Information provided on how to obtain vaccines.   Qualifies for Shingles Vaccine? No   Zostavax completed  not on record   Shingrix Completed?: Yes  Screening  Tests Health Maintenance  Topic Date Due   Diabetic kidney evaluation - Urine ACR  10/26/2020   COVID-19 Vaccine (5 - Moderna series) 02/21/2021   INFLUENZA VACCINE  11/10/2022 (Originally 03/12/2022)   HEMOGLOBIN A1C  08/24/2022   OPHTHALMOLOGY EXAM  10/08/2022   FOOT EXAM  11/09/2022   Diabetic kidney evaluation - GFR measurement  03/15/2023   DEXA SCAN  06/15/2023   TETANUS/TDAP  12/31/2027   Pneumonia Vaccine 52+ Years old  Completed   Hepatitis C Screening  Completed   Zoster Vaccines- Shingrix  Completed   HPV VACCINES  Aged Out   COLONOSCOPY (Pts 45-28yr Insurance coverage will need to be confirmed)  Discontinued    Health Maintenance  Health Maintenance Due  Topic Date Due   Diabetic kidney evaluation - Urine ACR  10/26/2020   COVID-19 Vaccine (5 - Moderna series) 02/21/2021    Colorectal cancer screening: Referral to GI placed patient has appointment. Pt aware the office will call re: appt.  Mammogram status: Completed 2023. Repeat every year  Bone Density status: Completed 2023. Results reflect: Bone density results: OSTEOPENIA. Repeat every 2 years.  Lung Cancer Screening: (Low Dose CT Chest recommended if Age 76-80years, 30 pack-year currently smoking OR have quit w/in 15years.) does not qualify.   Lung Cancer Screening Referral: N/A  Additional Screening:  Hepatitis C Screening: does not qualify;  Vision Screening: Recommended annual ophthalmology exams for early detection of glaucoma and other disorders of the eye. Is the patient up to date with their annual eye exam?  Yes  Who is the provider or what is the name of the office in which the patient attends annual eye exams? [facility] Dental Screening: Recommended annual dental exams for proper oral hygiene  Community Resource Referral / Chronic Care Management: CRR required this visit?  No   CCM required this visit?  No      Plan:     I have personally reviewed and noted the following in the  patient's chart:   Medical and social history Use of alcohol, tobacco or illicit drugs  Current medications and supplements including opioid prescriptions. Patient is currently taking opioid prescriptions. Information provided to patient regarding non-opioid alternatives. Patient advised to discuss non-opioid treatment plan with their provider. Functional ability and status Nutritional status Physical activity Advanced directives List of other physicians Hospitalizations, surgeries, and ER visits in previous 12 months Vitals Screenings to include cognitive, depression, and falls Referrals and appointments  In addition, I have reviewed and discussed with patient certain preventive protocols, quality metrics, and best practice recommendations. A written personalized care plan for  preventive services as well as general preventive health recommendations were provided to patient.     Cindy Hazy, CMA   05/31/2022

## 2022-05-31 NOTE — Patient Instructions (Signed)
  Felicia Pugh , Thank you for taking time to come for your Medicare Wellness Visit. I appreciate your ongoing commitment to your health goals. Please review the following plan we discussed and let me know if I can assist you in the future.   These are the goals we discussed:  Goals      DIET - INCREASE WATER INTAKE     Try to drink 6-8 glasses of water daily.     Exercise 3x per week (30 min per time)     HEMOGLOBIN A1C < 7.0     Watch carbohydrate intake and reduce sugary snacks.       Patient Stated     05/31/2022 AWV Goal: Improved Nutrition/Diet  Patient will verbalize understanding that diet plays an important role in overall health and that a poor diet is a risk factor for many chronic medical conditions.  Over the next year, patient will improve self management of their diet by incorporating better food choices. Patient will utilize available community resources to help with food acquisition if needed (ex: food pantries, [address], etc) Patient will work with nutrition specialist if a referral was made         This is a list of the screening recommended for you and due dates:  Health Maintenance  Topic Date Due   Yearly kidney health urinalysis for diabetes  10/26/2020   COVID-19 Vaccine (5 - Moderna series) 02/21/2021   Flu Shot  11/10/2022*   Hemoglobin A1C  08/24/2022   Eye exam for diabetics  10/08/2022   Complete foot exam   11/09/2022   Yearly kidney function blood test for diabetes  03/15/2023   DEXA scan (bone density measurement)  06/15/2023   Tetanus Vaccine  12/31/2027   Pneumonia Vaccine  Completed   Hepatitis C Screening: USPSTF Recommendation to screen - Ages 51-79 yo.  Completed   Zoster (Shingles) Vaccine  Completed   HPV Vaccine  Aged Out   Colon Cancer Screening  Discontinued  *Topic was postponed. The date shown is not the original due date.

## 2022-06-13 DIAGNOSIS — M4326 Fusion of spine, lumbar region: Secondary | ICD-10-CM | POA: Diagnosis not present

## 2022-06-25 ENCOUNTER — Other Ambulatory Visit: Payer: Self-pay | Admitting: *Deleted

## 2022-06-25 DIAGNOSIS — M25562 Pain in left knee: Secondary | ICD-10-CM

## 2022-07-03 ENCOUNTER — Encounter: Payer: Self-pay | Admitting: Family Medicine

## 2022-07-03 ENCOUNTER — Ambulatory Visit (INDEPENDENT_AMBULATORY_CARE_PROVIDER_SITE_OTHER): Payer: Medicare PPO | Admitting: Family Medicine

## 2022-07-03 VITALS — BP 141/76 | HR 85 | Ht 62.5 in | Wt 163.0 lb

## 2022-07-03 DIAGNOSIS — Z794 Long term (current) use of insulin: Secondary | ICD-10-CM

## 2022-07-03 DIAGNOSIS — E785 Hyperlipidemia, unspecified: Secondary | ICD-10-CM | POA: Diagnosis not present

## 2022-07-03 DIAGNOSIS — Z23 Encounter for immunization: Secondary | ICD-10-CM

## 2022-07-03 DIAGNOSIS — E1169 Type 2 diabetes mellitus with other specified complication: Secondary | ICD-10-CM

## 2022-07-03 DIAGNOSIS — E1159 Type 2 diabetes mellitus with other circulatory complications: Secondary | ICD-10-CM

## 2022-07-03 DIAGNOSIS — I152 Hypertension secondary to endocrine disorders: Secondary | ICD-10-CM | POA: Diagnosis not present

## 2022-07-03 LAB — URINALYSIS
Bilirubin, UA: NEGATIVE
Ketones, UA: NEGATIVE
Nitrite, UA: NEGATIVE
Protein,UA: NEGATIVE
RBC, UA: NEGATIVE
Specific Gravity, UA: 1.02 (ref 1.005–1.030)
Urobilinogen, Ur: 0.2 mg/dL (ref 0.2–1.0)
pH, UA: 7 (ref 5.0–7.5)

## 2022-07-03 LAB — BAYER DCA HB A1C WAIVED: HB A1C (BAYER DCA - WAIVED): 7.6 % — ABNORMAL HIGH (ref 4.8–5.6)

## 2022-07-03 NOTE — Progress Notes (Signed)
BP (!) 141/76   Pulse 85   Ht 5' 2.5" (1.588 m)   Wt 163 lb (73.9 kg)   SpO2 98%   BMI 29.34 kg/m    Subjective:   Patient ID: Felicia Pugh, female    DOB: 1945-12-13, 76 y.o.   MRN: [id number]  HPI: Felicia Pugh is a 76 y.o. female presenting on 07/03/2022 for Medical Management of Chronic Issues, Diabetes, dry mouth, and Dysuria   HPI Type 2 diabetes mellitus Patient comes in today for recheck of his diabetes. Patient has been currently taking Glyxambi and Tresiba 35.  She is getting on some prednisone for a little bit so recommended she go up to 40 while she is on the prednisone.. Patient is currently on an ACE inhibitor/ARB. Patient has seen an ophthalmologist this year. Patient denies any issues with their feet. The symptom started onset as an adult hypertension and hyperlipidemia ARE RELATED TO DM   Hyperlipidemia Patient is coming in for recheck of his hyperlipidemia. The patient is currently taking Lipitor. They deny any issues with myalgias or history of liver damage from it. They deny any focal numbness or weakness or chest pain.   Hypertension Patient is currently on losartan, and their blood pressure today is 141/76. Patient denies any lightheadedness or dizziness. Patient denies headaches, blurred vision, chest pains, shortness of breath, or weakness. Denies any side effects from medication and is content with current medication.   Relevant past medical, surgical, family and social history reviewed and updated as indicated. Interim medical history since our last visit reviewed. Allergies and medications reviewed and updated.  Review of Systems  Constitutional:  Negative for chills and fever.  Eyes:  Negative for redness and visual disturbance.  Respiratory:  Negative for chest tightness and shortness of breath.   Cardiovascular:  Negative for chest pain and leg swelling.  Genitourinary:  Negative for difficulty urinating and dysuria.  Musculoskeletal:  Positive  for arthralgias and back pain. Negative for gait problem.  Skin:  Negative for rash.  Neurological:  Negative for light-headedness and headaches.  Psychiatric/Behavioral:  Negative for agitation and behavioral problems.   All other systems reviewed and are negative.   Per HPI unless specifically indicated above   Allergies as of 07/03/2022       Reactions   Metformin And Related    Severe diarrhea   Asa [aspirin]    GI Dr does not want her to take due to hx ulcers   Nsaids Other (See Comments)   GI Dr does not want her to take due to hx of ulcers Instructed by GI not to take    Other Diarrhea   Steroids   Tolmetin Other (See Comments)   GI Dr does not want her to take due to hx of ulcers GI Dr does not want her to take due to hx of ulcers        Medication List        Accurate as of July 03, 2022 11:08 AM. If you have any questions, ask your nurse or doctor.          atorvastatin 40 MG tablet Commonly known as: LIPITOR Take 1 tablet (40 mg total) by mouth daily.   balsalazide 750 MG capsule Commonly known as: COLAZAL Take 2 capsules (1,500 mg total) by mouth in the morning and at bedtime.   ciclopirox 8 % solution Commonly known as: PENLAC APPLY EVERY DAY TO SPLIT TOENAIL ON RIGHT BIG TOE. CLEANSE WITH  NAIL POLISH REMOVER ONCE A WEEK.   Glyxambi 25-5 MG Tabs Generic drug: Empagliflozin-linaGLIPtin Take 1 tablet by mouth daily.   HYDROcodone-acetaminophen 10-325 MG tablet Commonly known as: NORCO Take 1 tablet by mouth every 4 (four) hours as needed.   Insulin Syringe-Needle U-100 31G X 1/4" 1 ML Misc 1 Syringe by Does not apply route 2 (two) times daily.   loperamide 2 MG capsule Commonly known as: IMODIUM Take by mouth.   losartan 25 MG tablet Commonly known as: COZAAR Take 1 tablet (25 mg total) by mouth daily.   methocarbamol 500 MG tablet Commonly known as: ROBAXIN Take 500 mg by mouth every 8 (eight) hours as needed.   nystatin  powder Generic drug: nystatin APPLY POWDER TOPICALLY TO AFFECTED AREA THREE TIMES DAILY   OneTouch Verio test strip Generic drug: glucose blood USE 1 STRIP TO CHECK GLUCOSE TWICE DAILY   Pen Needles 31G X 5 MM Misc Use to give insulin with flexpen   Tresiba FlexTouch 100 UNIT/ML FlexTouch Pen Generic drug: insulin degludec INJECT 40 UNITS SUBCUTANEOUSLY ONCE DAILY   WOMENS 50+ MULTI VITAMIN/MIN PO Take by mouth.         Objective:   BP (!) 141/76   Pulse 85   Ht 5' 2.5" (1.588 m)   Wt 163 lb (73.9 kg)   SpO2 98%   BMI 29.34 kg/m   Wt Readings from Last 3 Encounters:  07/03/22 163 lb (73.9 kg)  05/27/22 163 lb 6.4 oz (74.1 kg)  04/22/22 166 lb (75.3 kg)    Physical Exam Vitals and nursing note reviewed.  Constitutional:      General: She is not in acute distress.    Appearance: She is well-developed. She is not diaphoretic.  Eyes:     Conjunctiva/sclera: Conjunctivae normal.  Cardiovascular:     Rate and Rhythm: Normal rate and regular rhythm.     Heart sounds: Normal heart sounds. No murmur heard. Pulmonary:     Effort: Pulmonary effort is normal. No respiratory distress.     Breath sounds: Normal breath sounds. No wheezing.  Musculoskeletal:        General: No swelling or tenderness. Normal range of motion.  Skin:    General: Skin is warm and dry.     Findings: No rash.  Neurological:     Mental Status: She is alert and oriented to person, place, and time.     Coordination: Coordination normal.  Psychiatric:        Behavior: Behavior normal.       Assessment & Plan:   Problem List Items Addressed This Visit       Cardiovascular and Mediastinum   Hypertension associated with diabetes ([place])   Relevant Orders   CBC with Differential/Platelet   CMP14+EGFR   Lipid panel   Bayer DCA Hb A1c Waived     Endocrine   Hyperlipidemia associated with type 2 diabetes mellitus ([place])   Relevant Orders   CBC with Differential/Platelet   CMP14+EGFR    Lipid panel   Bayer DCA Hb A1c Waived   Type 2 diabetes mellitus with other specified complication (HCC) - Primary   Relevant Orders   CBC with Differential/Platelet   CMP14+EGFR   Lipid panel   Bayer DCA Hb A1c Waived   Urine Culture   Urinalysis   Microalbumin / creatinine urine ratio    A1c 7.6 which is about the same.  She has been less active because of her back issues which she is  hoping will improve and she can increase her activity.  She also has been drinking some abdominal teas so she is going to back off on that.  Increase the Tresiba to 40 units while on prednisone but after that go back to 35.  May consider Ozempic or Trulicity in the future. Follow up plan: Return in about 3 months (around 10/03/2022), or if symptoms worsen or fail to improve, for Diabetes and hypertension recheck.  Counseling provided for all of the vaccine components Orders Placed This Encounter  Procedures   Urine Culture   CBC with Differential/Platelet   CMP14+EGFR   Lipid panel   Bayer DCA Hb A1c Waived   Urinalysis   Microalbumin / creatinine urine ratio    Caryl Pina, MD [facility] 07/03/2022, 11:08 AM

## 2022-07-04 LAB — CMP14+EGFR
ALT: 11 IU/L (ref 0–32)
AST: 22 IU/L (ref 0–40)
Albumin/Globulin Ratio: 1.8 (ref 1.2–2.2)
Albumin: 4.2 g/dL (ref 3.8–4.8)
Alkaline Phosphatase: 120 IU/L (ref 44–121)
BUN/Creatinine Ratio: 20 (ref 12–28)
BUN: 13 mg/dL (ref 8–27)
Bilirubin Total: 0.4 mg/dL (ref 0.0–1.2)
CO2: 24 mmol/L (ref 20–29)
Calcium: 9.2 mg/dL (ref 8.7–10.3)
Chloride: 108 mmol/L — ABNORMAL HIGH (ref 96–106)
Creatinine, Ser: 0.64 mg/dL (ref 0.57–1.00)
Globulin, Total: 2.4 g/dL (ref 1.5–4.5)
Glucose: 91 mg/dL (ref 70–99)
Potassium: 4.2 mmol/L (ref 3.5–5.2)
Sodium: 145 mmol/L — ABNORMAL HIGH (ref 134–144)
Total Protein: 6.6 g/dL (ref 6.0–8.5)
eGFR: 92 mL/min/{1.73_m2} (ref >59)

## 2022-07-04 LAB — LIPID PANEL
Chol/HDL Ratio: 2 ratio (ref 0.0–4.4)
Cholesterol, Total: 169 mg/dL (ref 100–199)
HDL: 83 mg/dL (ref >39)
LDL Chol Calc (NIH): 76 mg/dL (ref 0–99)
Triglycerides: 45 mg/dL (ref 0–149)
VLDL Cholesterol Cal: 10 mg/dL (ref 5–40)

## 2022-07-04 LAB — CBC WITH DIFFERENTIAL/PLATELET
Basophils Absolute: 0.1 10*3/uL (ref 0.0–0.2)
Basos: 1 %
EOS (ABSOLUTE): 0.1 10*3/uL (ref 0.0–0.4)
Eos: 2 %
Hematocrit: 37.9 % (ref 34.0–46.6)
Hemoglobin: 12.2 g/dL (ref 11.1–15.9)
Immature Grans (Abs): 0 10*3/uL (ref 0.0–0.1)
Immature Granulocytes: 0 %
Lymphocytes Absolute: 1.6 10*3/uL (ref 0.7–3.1)
Lymphs: 21 %
MCH: 27.9 pg (ref 26.6–33.0)
MCHC: 32.2 g/dL (ref 31.5–35.7)
MCV: 87 fL (ref 79–97)
Monocytes Absolute: 0.7 10*3/uL (ref 0.1–0.9)
Monocytes: 9 %
Neutrophils Absolute: 5.2 10*3/uL (ref 1.4–7.0)
Neutrophils: 67 %
Platelets: 278 10*3/uL (ref 150–450)
RBC: 4.37 x10E6/uL (ref 3.77–5.28)
RDW: 13.2 % (ref 11.7–15.4)
WBC: 7.6 10*3/uL (ref 3.4–10.8)

## 2022-07-05 LAB — MICROALBUMIN / CREATININE URINE RATIO
Creatinine, Urine: 62.6 mg/dL
Microalb/Creat Ratio: 61 mg/g creat — ABNORMAL HIGH (ref 0–29)
Microalbumin, Urine: 37.9 ug/mL

## 2022-07-05 LAB — URINE CULTURE

## 2022-07-09 ENCOUNTER — Ambulatory Visit (INDEPENDENT_AMBULATORY_CARE_PROVIDER_SITE_OTHER): Payer: Medicare PPO | Admitting: Physician Assistant

## 2022-07-09 ENCOUNTER — Ambulatory Visit (HOSPITAL_COMMUNITY)
Admission: RE | Admit: 2022-07-09 | Discharge: 2022-07-09 | Disposition: A | Discharge status: Home / Self Care | Payer: Medicare PPO | Source: Ambulatory Visit | Attending: Vascular Surgery | Admitting: Vascular Surgery

## 2022-07-09 VITALS — BP 143/80 | HR 79 | Temp 97.7°F | Resp 18 | Ht 62.0 in | Wt 165.6 lb

## 2022-07-09 DIAGNOSIS — I872 Venous insufficiency (chronic) (peripheral): Secondary | ICD-10-CM | POA: Diagnosis not present

## 2022-07-09 DIAGNOSIS — I8393 Asymptomatic varicose veins of bilateral lower extremities: Secondary | ICD-10-CM

## 2022-07-09 DIAGNOSIS — M25562 Pain in left knee: Secondary | ICD-10-CM | POA: Insufficient documentation

## 2022-07-09 NOTE — Progress Notes (Unsigned)
[facility] [place]   Reason for referral: Swollen left leg  History of Present Illness  Felicia Pugh is a 76 y.o. female who presents with chief complaint: swollen leg.  Patient notes, onset of swelling several years ago, associated with prolonged sitting and standing.  The patient has had no history of DVT, positive history of varicose vein, no history of venous stasis ulcers, no history of  Lymphedema and no history of skin changes in lower legs.  There is unknown family history of venous disorders.  The patient has not used compression stockings in the past.  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  She does all her ADL's independently.  She states her swelling does dissipate over night and returns throughout the day.  The swelling causes pain and aching.    Past Medical History:  Diagnosis Date   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 Sonny Dandy, no longer seeing anyone (02/2015)   Cataract    Complication of anesthesia    difficulty breathing after waking up from anesthesia   Crohn's disease ([place]) 2016   diagnosed in 2016 and started on Colazal, but inflammation/ulceration in ileum/cecum dates back to 2006.   Diabetes mellitus without complication ([place])    Diabetic neuropathy ([place])    Hyperlipidemia    Hypertension    IBS (irritable bowel syndrome)    Stroke ([place]) 08/12/2009   Syncope and collapse    Thyroid nodule    Ulcer     Past Surgical History:  Procedure Laterality Date   ABDOMINAL HYSTERECTOMY     partial   BACK SURGERY     x3 (Dr Loyola Mast, Dr Patrice Paradise)   [place] SURGERY  03/2022   BREAST SURGERY Right 2002   cyst removed   COLONOSCOPY  2006   RMR: Normal colon except for ileocecal valve, eroded/ulcerated areas at ileocecal valve with mucosal hemorrhage present status post biopsy. ulcer (ileocecal valve, biopsy): Chronic active inflammation with ulceration, nonspecific.   COLONOSCOPY  09/06/2009   RMR: Suboptimal prep on the  right side. Ulcers noted at IC valve, base of cecum, and scattered through food part of ascending colon. Pathology wiht chronic active colitis, question Crohn's   COLONOSCOPY N/A 03/27/2015   Dr. Gala Romney: abnormal cecum and IC valve most consistent with IBD. TI intubated and appeared normal. Likely Crohn's disease. Patient denies NSAID use. No improvement with Lialda samples at time of initial diagnosis   COLONOSCOPY N/A 05/15/2017   Dr. Gala Romney: ab normal IC valve, s/p biopsy with active ileitis, diverticulosis in sigmoid and descending colon. Non-bleeding internal hemorrhoids   ESOPHAGOGASTRODUODENOSCOPY     HAND SURGERY Right    Carpel tunnel and right elbow   LAPAROSCOPIC APPENDECTOMY N/A 07/21/2016   Procedure: APPENDECTOMY LAPAROSCOPIC;  Surgeon: Aviva Signs, MD;  Location: [facility];  Service: General;  Laterality: N/A;   Small bowel capsule endoscopy  06/10/2005   RMR: single erosion versus AVM, distal ileum   Small bowel capsule endoscopy  prior to 05/2005   [place]: reported erosions/ulcerations per medical record. actual report unavailable.    Social History   Socioeconomic History   Marital status: Married    Spouse name: Herbie [place]   Number of children: 5   Years of education: 9th grade-then got her GED at [employer]   Highest education level: GED or equivalent  Occupational History   Occupation: retired  Tobacco Use   Smoking status: Never   Smokeless tobacco: Never  Vaping  Use   Vaping Use: Never used  Substance and Sexual Activity   Alcohol use: No   Drug use: No   Sexual activity: Not Currently  Other Topics Concern   Not on file  Social History Narrative   Lives home with her husband - he is on dialysis - she helps care for him   Their children live nearby   She babysits her 57 year old grandchild   Social Determinants of Health   Financial Resource Strain: [place]  (05/31/2022)   Overall Financial Resource Strain (CARDIA)    Difficulty of Paying Living Expenses: Not hard  at all  Food Insecurity: No Food Insecurity (05/31/2022)   Hunger Vital Sign    Worried About Running Out of Food in the Last Year: Never true    [place] in the Last Year: Never true  Transportation Needs: No Transportation Needs (05/31/2022)   PRAPARE - Hydrologist (Medical): No    Lack of Transportation (Non-Medical): No  Physical Activity: Sufficiently Active (05/31/2022)   Exercise Vital Sign    Days of Exercise per Week: 7 days    Minutes of Exercise per Session: 60 min  Stress: No Stress Concern Present (05/31/2022)   [place] of Stress : Not at all  Social Connections: [place] (05/31/2022)   Social Connection and Isolation Panel [NHANES]    Frequency of Communication with Friends and Family: More than three times a week    Frequency of Social Gatherings with Friends and Family: Twice a week    Attends Religious Services: More than 4 times per year    Active Member of [employer] or Organizations: Yes    Attends Music therapist: More than 4 times per year    Marital Status: Married  Human resources officer Violence: Not At Risk (05/31/2022)   Humiliation, Afraid, Rape, and Kick questionnaire    Fear of Current or Ex-Partner: No    Emotionally Abused: No    Physically Abused: No    Sexually Abused: No    Family History  Problem Relation Age of Onset   Hypertension Mother    Stroke Mother    Diabetes Father        w/ BKA   Hypertension Father    Asthma Father    Cancer Sister    Diabetes Sister        stomach   Breast cancer Sister    Diabetes Sister    Cancer Sister        breast   Hypertension Sister    Diabetes Brother    Hypertension Daughter    Hypertension Son    Multiple sclerosis Son    Colon cancer Neg Hx    Inflammatory bowel disease Neg Hx     Current Outpatient Medications on File Prior to Visit  Medication Sig  Dispense Refill   atorvastatin (LIPITOR) 40 MG tablet Take 1 tablet (40 mg total) by mouth daily. 90 tablet 3   balsalazide (COLAZAL) 750 MG capsule Take 2 capsules (1,500 mg total) by mouth in the morning and at bedtime. 360 capsule 3   ciclopirox (PENLAC) 8 % solution APPLY EVERY DAY TO SPLIT TOENAIL ON RIGHT BIG TOE. CLEANSE WITH NAIL POLISH REMOVER ONCE A WEEK.     Empagliflozin-linaGLIPtin (GLYXAMBI) 25-5 MG TABS Take 1 tablet by mouth daily. 90 tablet 3   glucose blood (ONETOUCH VERIO) test  strip USE 1 STRIP TO CHECK GLUCOSE TWICE DAILY 200 each 2   HYDROcodone-acetaminophen (NORCO) 10-325 MG tablet Take 1 tablet by mouth every 4 (four) hours as needed.     insulin degludec (TRESIBA FLEXTOUCH) 100 UNIT/ML FlexTouch Pen INJECT 40 UNITS SUBCUTANEOUSLY ONCE DAILY 30 mL 1   Insulin Pen Needle (PEN NEEDLES) 31G X 5 MM MISC Use to give insulin with flexpen 100 each 1   Insulin Syringe-Needle U-100 31G X 1/4" 1 ML MISC 1 Syringe by Does not apply route 2 (two) times daily. 60 each 6   loperamide (IMODIUM) 2 MG capsule Take by mouth.     losartan (COZAAR) 25 MG tablet Take 1 tablet (25 mg total) by mouth daily. 90 tablet 3   methocarbamol (ROBAXIN) 500 MG tablet Take 500 mg by mouth every 8 (eight) hours as needed.     Multiple Vitamins-Minerals (WOMENS 50+ MULTI VITAMIN/MIN PO) Take by mouth.     nystatin powder APPLY POWDER TOPICALLY TO AFFECTED AREA THREE TIMES DAILY     No current facility-administered medications on file prior to visit.    Allergies as of 07/09/2022 - Review Complete 07/09/2022  Allergen Reaction Noted   Metformin and related  05/11/2014   Asa [aspirin]  11/04/2012   Nsaids Other (See Comments) 11/04/2012   Other Diarrhea 12/25/2014   Tolmetin Other (See Comments) 11/04/2012     ROS:   General:  No weight loss, Fever, chills  HEENT: No recent headaches, no nasal bleeding, no visual changes, no sore throat  Neurologic: No dizziness, blackouts, seizures. No recent  symptoms of stroke or mini- stroke. No recent episodes of slurred speech, or temporary blindness.  Cardiac: No recent episodes of chest pain/pressure, no shortness of breath at rest.  No shortness of breath with exertion.  Denies history of atrial fibrillation or irregular heartbeat  Vascular: No history of rest pain in feet.  No history of claudication.  No history of non-healing ulcer, No history of DVT   Pulmonary: No home oxygen, no productive cough, no hemoptysis,  No asthma or wheezing  Musculoskeletal:  [ ]  Arthritis, [ ]  Low back pain,  [ ]  Joint pain  Hematologic:No history of hypercoagulable state.  No history of easy bleeding.  No history of anemia  Gastrointestinal: No hematochezia or melena,  No gastroesophageal reflux, no trouble swallowing  Urinary: [ ]  chronic Kidney disease, [ ]  on HD - [ ]  MWF or [ ]  TTHS, [ ]  Burning with urination, [ ]  Frequent urination, [ ]  Difficulty urinating;   Skin: No rashes  Psychological: No history of anxiety,  No history of depression  Physical Examination  Vitals:   07/09/22 1245  BP: (!) 143/80  Pulse: 79  Resp: 18  Temp: 97.7 F (36.5 C)  TempSrc: Temporal  SpO2: 98%  Weight: 165 lb 9.6 oz (75.1 kg)  Height: 5' 2"  (1.575 m)    Body mass index is 30.29 kg/m.  General:  Alert and oriented, no acute distress HEENT: Normal Neck: No bruit or JVD Pulmonary: Clear to auscultation bilaterally Cardiac: Regular Rate and Rhythm without murmur Abdomen: Soft, non-tender, non-distended, no mass, no scars Skin: No rash         Extremity Pulses:  2+ radial, brachial, femoral, dorsalis pedis, posterior tibial pulses bilaterally Musculoskeletal: No deformity or edema  Neurologic: Upper and lower extremity motor 5/5 and symmetric  DATA:  +--------------+---------+------+-----------+------------+-----------------  ----+  LEFT         Reflux NoRefluxReflux TimeDiameter cmsComments  Yes                                                 +--------------+---------+------+-----------+------------+-----------------  ----+  CFV          no                                                            +--------------+---------+------+-----------+------------+-----------------  ----+  FV mid        no                                                            +--------------+---------+------+-----------+------------+-----------------  ----+  Popliteal    no                                                            +--------------+---------+------+-----------+------------+-----------------  ----+  GSV at [facility]              yes    >500 ms      0.77    Numerous  varicosities  +--------------+---------+------+-----------+------------+-----------------  ----+  GSV prox thigh          yes    >500 ms      0.62                            +--------------+---------+------+-----------+------------+-----------------  ----+  GSV mid thigh           yes    >500 ms      0.56                            +--------------+---------+------+-----------+------------+-----------------  ----+  GSV dist thigh          yes    >500 ms      0.64    tortuous                +--------------+---------+------+-----------+------------+-----------------  ----+  GSV at knee             yes    >500 ms      0.51                            +--------------+---------+------+-----------+------------+-----------------  ----+  GSV prox calf           yes    >500 ms      0.67    out of fascia           +--------------+---------+------+-----------+------------+-----------------  ----+  GSV mid calf  no  NV                      +--------------+---------+------+-----------+------------+-----------------  ----+  SSV Pop Fossa no                            0.18                             +--------------+---------+------+-----------+------------+-----------------  ----+  SSV prox calf no                            0.17                            +--------------+---------+------+-----------+------------+-----------------  ----+  SSV mid calf  no                            0.21                            +--------------+---------+------+-----------+------------+-----------------  ----+  AASV O        no                            0.21                            +--------------+---------+------+-----------+------------+-----------------  ----+  AASV prx      no                            0.33                            +--------------+---------+------+-----------+------------+-----------------  ----+         Summary:  Left:  - No evidence of deep vein thrombosis seen in the left lower extremity,  from the common femoral through the popliteal veins.  - No evidence of superficial venous reflux seen in the left short  saphenous vein.  - Venous reflux is noted in the left sapheno-femoral junction.  - Venous reflux is noted in the left greater saphenous vein in the thigh.  - Venous reflux is noted in the left greater saphenous vein in the calf.   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 The GSV is enlarged > 0.4 cm No open wounds or ischemic changes.  She has palpable pedal pulses and is not at risk of limb loss.   Plan: She will placed in thigh high compression 20-30 mm hg, elevation and continued exercise.  She will f/u in 3 months with our [facility] to be considered for laser ablation therapy.    Ruta Hinds, MD [facility] [place] Office: [phone] Pager: [phone]

## 2022-07-10 DIAGNOSIS — M4326 Fusion of spine, lumbar region: Secondary | ICD-10-CM | POA: Diagnosis not present

## 2022-07-11 ENCOUNTER — Encounter: Payer: Self-pay | Admitting: Physician Assistant

## 2022-07-23 DIAGNOSIS — B351 Tinea unguium: Secondary | ICD-10-CM | POA: Diagnosis not present

## 2022-07-23 DIAGNOSIS — E1142 Type 2 diabetes mellitus with diabetic polyneuropathy: Secondary | ICD-10-CM | POA: Diagnosis not present

## 2022-07-23 DIAGNOSIS — M79676 Pain in unspecified toe(s): Secondary | ICD-10-CM | POA: Diagnosis not present

## 2022-07-23 DIAGNOSIS — L84 Corns and callosities: Secondary | ICD-10-CM | POA: Diagnosis not present

## 2022-07-25 ENCOUNTER — Telehealth: Payer: Self-pay

## 2022-07-25 DIAGNOSIS — K219 Gastro-esophageal reflux disease without esophagitis: Secondary | ICD-10-CM

## 2022-07-25 MED ORDER — BALSALAZIDE DISODIUM 750 MG PO CAPS: 1500.0000 mg | ORAL_CAPSULE | Freq: Two times a day (BID) | ORAL | 3 refills | Status: DC

## 2022-07-25 NOTE — Telephone Encounter (Signed)
Lmom notifying pt that rx was sent in.

## 2022-07-25 NOTE — Telephone Encounter (Signed)
Pt called requesting a refill on her balsalazide. Pt was last seen on 05/27/2022.

## 2022-07-25 NOTE — Addendum Note (Signed)
Addended by: Mahala Menghini on: 07/25/2022 01:30 PM   Modules accepted: Orders

## 2022-07-25 NOTE — Telephone Encounter (Signed)
RX completed.

## 2022-08-20 ENCOUNTER — Encounter: Payer: Self-pay | Admitting: Gastroenterology

## 2022-09-07 DIAGNOSIS — J988 Other specified respiratory disorders: Secondary | ICD-10-CM | POA: Diagnosis not present

## 2022-09-07 DIAGNOSIS — B9689 Other specified bacterial agents as the cause of diseases classified elsewhere: Secondary | ICD-10-CM | POA: Diagnosis not present

## 2022-09-07 DIAGNOSIS — R059 Cough, unspecified: Secondary | ICD-10-CM | POA: Diagnosis not present

## 2022-09-07 DIAGNOSIS — R062 Wheezing: Secondary | ICD-10-CM | POA: Diagnosis not present

## 2022-09-23 ENCOUNTER — Other Ambulatory Visit: Payer: Self-pay | Admitting: Family Medicine

## 2022-09-23 DIAGNOSIS — E1169 Type 2 diabetes mellitus with other specified complication: Secondary | ICD-10-CM

## 2022-09-23 NOTE — Telephone Encounter (Signed)
Dettnger NTBS in Feb for 3 mos FU from Nov for DM & HTN. RF SENT to pharmacy

## 2022-09-23 NOTE — Telephone Encounter (Signed)
Appt made for 3/7, first available in the am.

## 2022-09-26 ENCOUNTER — Other Ambulatory Visit: Payer: Self-pay | Admitting: Family Medicine

## 2022-09-26 DIAGNOSIS — E1169 Type 2 diabetes mellitus with other specified complication: Secondary | ICD-10-CM

## 2022-10-02 ENCOUNTER — Other Ambulatory Visit: Payer: Self-pay | Admitting: Family Medicine

## 2022-10-02 DIAGNOSIS — Z1231 Encounter for screening mammogram for malignant neoplasm of breast: Secondary | ICD-10-CM

## 2022-10-08 ENCOUNTER — Other Ambulatory Visit: Payer: Self-pay | Admitting: Family Medicine

## 2022-10-08 DIAGNOSIS — L84 Corns and callosities: Secondary | ICD-10-CM | POA: Diagnosis not present

## 2022-10-08 DIAGNOSIS — M79676 Pain in unspecified toe(s): Secondary | ICD-10-CM | POA: Diagnosis not present

## 2022-10-08 DIAGNOSIS — E1142 Type 2 diabetes mellitus with diabetic polyneuropathy: Secondary | ICD-10-CM | POA: Diagnosis not present

## 2022-10-08 DIAGNOSIS — B351 Tinea unguium: Secondary | ICD-10-CM | POA: Diagnosis not present

## 2022-10-09 ENCOUNTER — Ambulatory Visit: Payer: Medicare PPO | Admitting: Vascular Surgery

## 2022-10-10 ENCOUNTER — Encounter: Payer: Self-pay | Admitting: Nurse Practitioner

## 2022-10-10 ENCOUNTER — Telehealth (INDEPENDENT_AMBULATORY_CARE_PROVIDER_SITE_OTHER): Payer: Medicare PPO | Admitting: Nurse Practitioner

## 2022-10-10 DIAGNOSIS — A084 Viral intestinal infection, unspecified: Secondary | ICD-10-CM

## 2022-10-10 MED ORDER — ONDANSETRON HCL 4 MG PO TABS: 4.0000 mg | ORAL_TABLET | Freq: Three times a day (TID) | ORAL | 0 refills | Status: DC | PRN

## 2022-10-10 NOTE — Progress Notes (Signed)
Virtual Visit Consent   Felicia Pugh, you are scheduled for a virtual visit with Mary-Margaret Hassell Done, [place], a [facility] provider, today.     Just as with appointments in the office, your consent must be obtained to participate.  Your consent will be active for this visit and any virtual visit you may have with one of our providers in the next 365 days.     If you have a MyChart account, a copy of this consent can be sent to you electronically.  All virtual visits are billed to your insurance company just like a traditional visit in the office.    As this is a virtual visit, video technology does not allow for your provider to perform a traditional examination.  This may limit your provider's ability to fully assess your condition.  If your provider identifies any concerns that need to be evaluated in person or the need to arrange testing (such as labs, EKG, etc.), we will make arrangements to do so.     Although advances in technology are sophisticated, we cannot ensure that it will always work on either your end or our end.  If the connection with a video visit is poor, the visit may have to be switched to a telephone visit.  With either a video or telephone visit, we are not always able to ensure that we have a secure connection.     I need to obtain your verbal consent now.   Are you willing to proceed with your visit today? YES   Felicia Pugh has provided verbal consent on 10/10/2022 for a virtual visit (video or telephone).   Mary-Margaret Hassell Done, FNP   Date: 10/10/2022 9:09 AM   Virtual Visit via Video Note   I, Mary-Margaret Hassell Done, connected with Felicia Pugh ([id number], Dec 22, 1945) on 10/10/22 at 11:50 AM EST by a video-enabled telemedicine application and verified that I am speaking with the correct person using two identifiers.  Location: Patient: Virtual Visit Location Patient: Home Provider: Virtual Visit Location Provider: Mobile   I discussed the limitations of  evaluation and management by telemedicine and the availability of in person appointments. The patient expressed understanding and agreed to proceed.    History of Present Illness: Felicia Pugh is a 77 y.o. who identifies as a female who was assigned female at birth, and is being seen today for nausea and vomiting.  HPI: Patient developed diarrhes 2 nights ago with some vomiting and diarrhea. No abdominal pain. Has not been eating but has been drinking liquids.    Review of Systems  Constitutional:  Negative for chills and fever.  Gastrointestinal:  Positive for diarrhea, nausea and vomiting. Negative for abdominal pain.    Problems:  Patient Active Problem List   Diagnosis Date Noted   Crohn's disease of both small and large intestine without complication ([place]) [id number]   Loose stools 06/07/2021   Bilateral carpal tunnel syndrome 10/17/2020   Obesity (BMI 30-39.9) 05/08/2017   Solitary pulmonary nodule 05/08/2017   GERD (gastroesophageal reflux disease) 05/02/2017   Type 2 diabetes mellitus with other specified complication ([place]) 0000000   Crohn's disease with complication ([place]) 0000000   Diverticulosis of colon without hemorrhage    History of stroke 07/08/2014   Osteopenia 05/11/2014   Hyperlipidemia associated with type 2 diabetes mellitus ([place]) 06/01/2008   Anemia, chronic disease 06/01/2008   Hypertension associated with diabetes ([place]) 06/01/2008    Allergies:  Allergies  Allergen Reactions   Metformin And  Related     Severe diarrhea   Asa [Aspirin]     GI Dr does not want her to take due to hx ulcers   Nsaids Other (See Comments)    GI Dr does not want her to take due to hx of ulcers Instructed by GI not to take    Other Diarrhea    Steroids   Tolmetin Other (See Comments)    GI Dr does not want her to take due to hx of ulcers GI Dr does not want her to take due to hx of ulcers    Medications:  Current Outpatient Medications:    atorvastatin (LIPITOR)  40 MG tablet, Take 1 tablet by mouth once daily, Disp: 90 tablet, Rfl: 0   balsalazide (COLAZAL) 750 MG capsule, Take 2 capsules (1,500 mg total) by mouth in the morning and at bedtime., Disp: 360 capsule, Rfl: 3   ciclopirox (PENLAC) 8 % solution, APPLY EVERY DAY TO SPLIT TOENAIL ON RIGHT BIG TOE. CLEANSE WITH NAIL POLISH REMOVER ONCE A WEEK., Disp: , Rfl:    Empagliflozin-linaGLIPtin (GLYXAMBI) 25-5 MG TABS, Take 1 tablet by mouth daily., Disp: 90 tablet, Rfl: 3   glucose blood (ONETOUCH VERIO) test strip, USE 1 STRIP TO CHECK GLUCOSE TWICE DAILY, Disp: 200 each, Rfl: 2   HYDROcodone-acetaminophen (NORCO) 10-325 MG tablet, Take 1 tablet by mouth every 4 (four) hours as needed., Disp: , Rfl:    insulin degludec (TRESIBA FLEXTOUCH) 100 UNIT/ML FlexTouch Pen, INJECT 40 UNITS SUBCUTANEOUSLY ONCE DAILY, Disp: 30 mL, Rfl: 1   Insulin Pen Needle (PEN NEEDLES) 31G X 5 MM MISC, Use to give insulin with flexpen, Disp: 100 each, Rfl: 1   Insulin Syringe-Needle U-100 31G X 1/4" 1 ML MISC, 1 Syringe by Does not apply route 2 (two) times daily., Disp: 60 each, Rfl: 6   loperamide (IMODIUM) 2 MG capsule, Take by mouth., Disp: , Rfl:    losartan (COZAAR) 25 MG tablet, Take 1 tablet by mouth once daily, Disp: 90 tablet, Rfl: 0   methocarbamol (ROBAXIN) 500 MG tablet, Take 500 mg by mouth every 8 (eight) hours as needed., Disp: , Rfl:    Multiple Vitamins-Minerals (WOMENS 50+ MULTI VITAMIN/MIN PO), Take by mouth., Disp: , Rfl:    nystatin powder, APPLY POWDER TOPICALLY TO AFFECTED AREA THREE TIMES DAILY, Disp: , Rfl:   Observations/Objective: Patient is well-developed, well-nourished in no acute distress.  Resting comfortably at home.  Head is normocephalic, atraumatic.  No labored breathing. Speech is clear and coherent with logical content.  Patient is alert and oriented at baseline.  No abdomina pain  Assessment and Plan:  [name] in today with chief complaint of nausea and vomiting and Diarrhea  (/)   1. Viral gastroenteritis First 24 Hours-Clear liquids  popsicles  Jello  gatorade  Sprite Second 24 hours-Add Full liquids ( Liquids you cant see through) Third 24 hours- Bland diet ( foods that are baked or broiled)  *avoiding fried foods and highly spiced foods* During these 3 days  Avoid milk, cheese, ice cream or any other dairy products  Avoid caffeine- REMEMBER Mt. Dew and Mello Yellow contain lots of caffeine You should eat and drink in  Frequent small volumes If no improvement in symptoms or worsen in 2-3 days should RETRUN TO OFFICE or go to ER!    - ondansetron (ZOFRAN) 4 MG tablet; Take 1 tablet (4 mg total) by mouth every 8 (eight) hours as needed for nausea or vomiting.  Dispense: 20 tablet; Refill: 0    Follow Up Instructions: I discussed the assessment and treatment plan with the patient. The patient was provided an opportunity to ask questions and all were answered. The patient agreed with the plan and demonstrated an understanding of the instructions.  A copy of instructions were sent to the patient via MyChart.  The patient was advised to call back or seek an in-person evaluation if the symptoms worsen or if the condition fails to improve as anticipated.  Time:  I spent 9 minutes with the patient via telehealth technology discussing the above problems/concerns.    Mary-Margaret Hassell Done, FNP

## 2022-10-10 NOTE — Patient Instructions (Signed)
Felicia Pugh, thank you for joining Chevis Pretty, FNP for today's virtual visit.  While this provider is not your primary care provider (PCP), if your PCP is located in our provider database this encounter information will be shared with them immediately following your visit.   [facility] account gives you access to today's visit and all your visits, tests, and labs performed at [facility] " click here if you don't have a [facility] account or go to [email or web address]  Consent: (Patient) Felicia Pugh provided verbal consent for this virtual visit at the beginning of the encounter.  Current Medications:  Current Outpatient Medications:    atorvastatin (LIPITOR) 40 MG tablet, Take 1 tablet by mouth once daily, Disp: 90 tablet, Rfl: 0   balsalazide (COLAZAL) 750 MG capsule, Take 2 capsules (1,500 mg total) by mouth in the morning and at bedtime., Disp: 360 capsule, Rfl: 3   ciclopirox (PENLAC) 8 % solution, APPLY EVERY DAY TO SPLIT TOENAIL ON RIGHT BIG TOE. CLEANSE WITH NAIL POLISH REMOVER ONCE A WEEK., Disp: , Rfl:    Empagliflozin-linaGLIPtin (GLYXAMBI) 25-5 MG TABS, Take 1 tablet by mouth daily., Disp: 90 tablet, Rfl: 3   glucose blood (ONETOUCH VERIO) test strip, USE 1 STRIP TO CHECK GLUCOSE TWICE DAILY, Disp: 200 each, Rfl: 2   HYDROcodone-acetaminophen (NORCO) 10-325 MG tablet, Take 1 tablet by mouth every 4 (four) hours as needed., Disp: , Rfl:    insulin degludec (TRESIBA FLEXTOUCH) 100 UNIT/ML FlexTouch Pen, INJECT 40 UNITS SUBCUTANEOUSLY ONCE DAILY, Disp: 30 mL, Rfl: 1   Insulin Pen Needle (PEN NEEDLES) 31G X 5 MM MISC, Use to give insulin with flexpen, Disp: 100 each, Rfl: 1   Insulin Syringe-Needle U-100 31G X 1/4" 1 ML MISC, 1 Syringe by Does not apply route 2 (two) times daily., Disp: 60 each, Rfl: 6   loperamide (IMODIUM) 2 MG capsule, Take by mouth., Disp: , Rfl:    losartan (COZAAR) 25 MG tablet, Take 1 tablet by mouth once  daily, Disp: 90 tablet, Rfl: 0   methocarbamol (ROBAXIN) 500 MG tablet, Take 500 mg by mouth every 8 (eight) hours as needed., Disp: , Rfl:    Multiple Vitamins-Minerals (WOMENS 50+ MULTI VITAMIN/MIN PO), Take by mouth., Disp: , Rfl:    nystatin powder, APPLY POWDER TOPICALLY TO AFFECTED AREA THREE TIMES DAILY, Disp: , Rfl:    ondansetron (ZOFRAN) 4 MG tablet, Take 1 tablet (4 mg total) by mouth every 8 (eight) hours as needed for nausea or vomiting., Disp: 20 tablet, Rfl: 0   Medications ordered in this encounter:  Meds ordered this encounter  Medications   ondansetron (ZOFRAN) 4 MG tablet    Sig: Take 1 tablet (4 mg total) by mouth every 8 (eight) hours as needed for nausea or vomiting.    Dispense:  20 tablet    Refill:  0    Order Specific Question:   Supervising Provider    Answer:   Caryl Pina A [id number]     *If you need refills on other medications prior to your next appointment, please contact your pharmacy*  Follow-Up: Call back or seek an in-person evaluation if the symptoms worsen or if the condition fails to improve as anticipated.  [place]  Other Instructions First 24 Hours-Clear liquids  popsicles  Jello  gatorade  Sprite Second 24 hours-Add Full liquids ( Liquids you cant see through) Third 24 hours- Bland diet ( foods that are baked or broiled)  *  avoiding fried foods and highly spiced foods* During these 3 days  Avoid milk, cheese, ice cream or any other dairy products  Avoid caffeine- REMEMBER Mt. Dew and Mello Yellow contain lots of caffeine You should eat and drink in  Frequent small volumes If no improvement in symptoms or worsen in 2-3 days should RETRUN TO OFFICE or go to ER!      If you have been instructed to have an in-person evaluation today at a local Urgent Care facility, please use the link below. It will take you to a list of all of our available [facility], including address, phone number and  hours of operation. Please do not delay care.  [facility]  If you or a family member do not have a primary care provider, use the link below to schedule a visit and establish care. When you choose a [facility] primary care physician or advanced practice provider, you gain a long-term partner in health. Find a Primary Care Provider  Learn more about [facility] in-office and virtual care options: [facility]

## 2022-10-12 ENCOUNTER — Emergency Department (HOSPITAL_COMMUNITY)
Admission: EM | Admit: 2022-10-12 | Discharge: 2022-10-12 | Disposition: A | Discharge status: Home / Self Care | Payer: Medicare PPO | Attending: Emergency Medicine | Admitting: Emergency Medicine

## 2022-10-12 ENCOUNTER — Other Ambulatory Visit: Payer: Self-pay

## 2022-10-12 ENCOUNTER — Encounter (HOSPITAL_COMMUNITY): Payer: Self-pay | Admitting: Emergency Medicine

## 2022-10-12 ENCOUNTER — Emergency Department (HOSPITAL_COMMUNITY): Payer: Medicare PPO

## 2022-10-12 DIAGNOSIS — K5090 Crohn's disease, unspecified, without complications: Secondary | ICD-10-CM | POA: Insufficient documentation

## 2022-10-12 DIAGNOSIS — E876 Hypokalemia: Secondary | ICD-10-CM | POA: Insufficient documentation

## 2022-10-12 DIAGNOSIS — R109 Unspecified abdominal pain: Secondary | ICD-10-CM | POA: Diagnosis not present

## 2022-10-12 DIAGNOSIS — Z794 Long term (current) use of insulin: Secondary | ICD-10-CM | POA: Insufficient documentation

## 2022-10-12 DIAGNOSIS — K529 Noninfective gastroenteritis and colitis, unspecified: Secondary | ICD-10-CM | POA: Diagnosis not present

## 2022-10-12 DIAGNOSIS — Z79899 Other long term (current) drug therapy: Secondary | ICD-10-CM | POA: Insufficient documentation

## 2022-10-12 DIAGNOSIS — I700 Atherosclerosis of aorta: Secondary | ICD-10-CM | POA: Diagnosis not present

## 2022-10-12 DIAGNOSIS — E119 Type 2 diabetes mellitus without complications: Secondary | ICD-10-CM | POA: Insufficient documentation

## 2022-10-12 DIAGNOSIS — R1084 Generalized abdominal pain: Secondary | ICD-10-CM | POA: Diagnosis present

## 2022-10-12 DIAGNOSIS — I10 Essential (primary) hypertension: Secondary | ICD-10-CM | POA: Insufficient documentation

## 2022-10-12 DIAGNOSIS — Z8673 Personal history of transient ischemic attack (TIA), and cerebral infarction without residual deficits: Secondary | ICD-10-CM | POA: Diagnosis not present

## 2022-10-12 LAB — CBC WITH DIFFERENTIAL/PLATELET
Abs Immature Granulocytes: 0 10*3/uL (ref 0.00–0.07)
Band Neutrophils: 1 %
Basophils Absolute: 0.1 10*3/uL (ref 0.0–0.1)
Basophils Relative: 1 %
Eosinophils Absolute: 0.1 10*3/uL (ref 0.0–0.5)
Eosinophils Relative: 2 %
HCT: 42.6 % (ref 36.0–46.0)
Hemoglobin: 12.9 g/dL (ref 12.0–15.0)
Lymphocytes Relative: 18 %
Lymphs Abs: 1.2 10*3/uL (ref 0.7–4.0)
MCH: 27.6 pg (ref 26.0–34.0)
MCHC: 30.3 g/dL (ref 30.0–36.0)
MCV: 91 fL (ref 80.0–100.0)
Monocytes Absolute: 0.5 10*3/uL (ref 0.1–1.0)
Monocytes Relative: 8 %
Neutro Abs: 4.6 10*3/uL (ref 1.7–7.7)
Neutrophils Relative %: 70 %
Platelets: 238 10*3/uL (ref 150–400)
RBC: 4.68 MIL/uL (ref 3.87–5.11)
RDW: 14.5 % (ref 11.5–15.5)
WBC: 6.5 10*3/uL (ref 4.0–10.5)
nRBC: 0 % (ref 0.0–0.2)

## 2022-10-12 LAB — COMPREHENSIVE METABOLIC PANEL
ALT: 16 U/L (ref 0–44)
AST: 25 U/L (ref 15–41)
Albumin: 4 g/dL (ref 3.5–5.0)
Alkaline Phosphatase: 112 U/L (ref 38–126)
Anion gap: 8 (ref 5–15)
BUN: 24 mg/dL — ABNORMAL HIGH (ref 8–23)
CO2: 25 mmol/L (ref 22–32)
Calcium: 9 mg/dL (ref 8.9–10.3)
Chloride: 105 mmol/L (ref 98–111)
Creatinine, Ser: 0.83 mg/dL (ref 0.44–1.00)
GFR, Estimated: >60 mL/min (ref >60)
Glucose, Bld: 144 mg/dL — ABNORMAL HIGH (ref 70–99)
Potassium: 3 mmol/L — ABNORMAL LOW (ref 3.5–5.1)
Sodium: 138 mmol/L (ref 135–145)
Total Bilirubin: 0.6 mg/dL (ref 0.3–1.2)
Total Protein: 7.5 g/dL (ref 6.5–8.1)

## 2022-10-12 LAB — LIPASE, BLOOD: Lipase: 51 U/L (ref 11–51)

## 2022-10-12 MED ORDER — SODIUM CHLORIDE 0.9 % IV SOLN: INTRAVENOUS | Status: DC

## 2022-10-12 MED ORDER — POTASSIUM CHLORIDE CRYS ER 20 MEQ PO TBCR
40.0000 meq | EXTENDED_RELEASE_TABLET | Freq: Once | ORAL | Status: AC
  Administered 2022-10-12: 40 meq via ORAL
  Filled 2022-10-12: qty 2

## 2022-10-12 MED ORDER — LOPERAMIDE HCL 2 MG PO TABS: 2.0000 mg | ORAL_TABLET | Freq: Four times a day (QID) | ORAL | 0 refills | Status: DC | PRN

## 2022-10-12 MED ORDER — POTASSIUM CHLORIDE CRYS ER 20 MEQ PO TBCR: 20.0000 meq | EXTENDED_RELEASE_TABLET | Freq: Two times a day (BID) | ORAL | 0 refills | Status: DC

## 2022-10-12 MED ORDER — ONDANSETRON HCL 4 MG/2ML IJ SOLN
4.0000 mg | Freq: Once | INTRAMUSCULAR | Status: AC
  Administered 2022-10-12: 4 mg via INTRAVENOUS
  Filled 2022-10-12: qty 2

## 2022-10-12 MED ORDER — IOHEXOL 300 MG/ML  SOLN
100.0000 mL | Freq: Once | INTRAMUSCULAR | Status: AC | PRN
  Administered 2022-10-12: 100 mL via INTRAVENOUS

## 2022-10-12 MED ORDER — ONDANSETRON 4 MG PO TBDP: 4.0000 mg | ORAL_TABLET | Freq: Three times a day (TID) | ORAL | 1 refills | Status: DC | PRN

## 2022-10-12 MED ORDER — SODIUM CHLORIDE 0.9 % IV BOLUS
500.0000 mL | Freq: Once | INTRAVENOUS | Status: AC
  Administered 2022-10-12: 500 mL via INTRAVENOUS

## 2022-10-12 NOTE — ED Notes (Signed)
Patient Alert and oriented to baseline. Stable and ambulatory to baseline. Patient verbalized understanding of the discharge instructions.  Patient belongings were taken by the patient.   

## 2022-10-12 NOTE — ED Provider Notes (Addendum)
[facility] Provider Note   CSN: [id number] Arrival date & time: 10/12/22  [id number]     History  Chief Complaint  Patient presents with   Abdominal Pain    Felicia Pugh is a 77 y.o. female.  Patient with a history of nausea vomiting and diarrhea since Tuesday.  Did have a virtual visit where she was prescribed Zofran says helps some.  But since 2 in the morning patient's had 6 loose bowel movements no blood in it not black in color and vomiting x 2 no blood.  Associated with some generalized abdominal pain mostly periumbilical.  Past medical history significant for the patient having history of Crohn's disease.  As well as diabetes.  Also hypertension hyperlipidemia and a stroke in 2011.  Past surgical history significant for abdominal hysterectomy.  And appendectomy in 2017.       Home Medications Prior to Admission medications   Medication Sig Start Date End Date Taking? Authorizing Provider  loperamide (IMODIUM A-D) 2 MG tablet Take 1 tablet (2 mg total) by mouth 4 (four) times daily as needed for diarrhea or loose stools. 10/12/22  Yes Fredia Sorrow, MD  ondansetron (ZOFRAN-ODT) 4 MG disintegrating tablet Take 1 tablet (4 mg total) by mouth every 8 (eight) hours as needed for nausea or vomiting. 10/12/22  Yes Fredia Sorrow, MD  atorvastatin (LIPITOR) 40 MG tablet Take 1 tablet by mouth once daily 09/23/22   Dettinger, Fransisca Kaufmann, MD  balsalazide (COLAZAL) 750 MG capsule Take 2 capsules (1,500 mg total) by mouth in the morning and at bedtime. 07/25/22   Mahala Menghini, PA-C  ciclopirox (PENLAC) 8 % solution APPLY EVERY DAY TO SPLIT TOENAIL ON RIGHT BIG TOE. CLEANSE WITH NAIL POLISH REMOVER ONCE A WEEK. 03/05/22   [provider]  Empagliflozin-linaGLIPtin (GLYXAMBI) 25-5 MG TABS Take 1 tablet by mouth daily. 08/03/21   Dettinger, Fransisca Kaufmann, MD  glucose blood (ONETOUCH VERIO) test strip USE 1 STRIP TO CHECK GLUCOSE TWICE DAILY  11/08/21   Dettinger, Fransisca Kaufmann, MD  HYDROcodone-acetaminophen (NORCO) 10-325 MG tablet Take 1 tablet by mouth every 4 (four) hours as needed. 04/17/22   [provider]  insulin degludec (TRESIBA FLEXTOUCH) 100 UNIT/ML FlexTouch Pen INJECT 40 UNITS SUBCUTANEOUSLY ONCE DAILY 02/21/22   Dettinger, Fransisca Kaufmann, MD  Insulin Pen Needle (PEN NEEDLES) 31G X 5 MM MISC Use to give insulin with flexpen 11/08/21   Dettinger, Fransisca Kaufmann, MD  Insulin Syringe-Needle U-100 31G X 1/4" 1 ML MISC 1 Syringe by Does not apply route 2 (two) times daily. 11/08/21   Dettinger, Fransisca Kaufmann, MD  loperamide (IMODIUM) 2 MG capsule Take by mouth.    [provider]  losartan (COZAAR) 25 MG tablet Take 1 tablet by mouth once daily 10/08/22   Dettinger, Fransisca Kaufmann, MD  methocarbamol (ROBAXIN) 500 MG tablet Take 500 mg by mouth every 8 (eight) hours as needed. 04/24/22   [provider]  Multiple Vitamins-Minerals (WOMENS 50+ [place] VITAMIN/MIN PO) Take by mouth.    [provider]  nystatin powder APPLY POWDER TOPICALLY TO AFFECTED AREA THREE TIMES DAILY    [provider]  ondansetron (ZOFRAN) 4 MG tablet Take 1 tablet (4 mg total) by mouth every 8 (eight) hours as needed for nausea or vomiting. 10/10/22   Hassell Done Mary-Margaret, FNP      Allergies    Metformin and related, Asa [aspirin], Nsaids, Other, and Tolmetin    Review of Systems  Review of Systems  Constitutional:  Negative for chills and fever.  HENT:  Negative for ear pain and sore throat.   Eyes:  Negative for pain and visual disturbance.  Respiratory:  Negative for cough and shortness of breath.   Cardiovascular:  Negative for chest pain and palpitations.  Gastrointestinal:  Positive for abdominal pain, diarrhea, nausea and vomiting. Negative for blood in stool.  Genitourinary:  Negative for dysuria and hematuria.  Musculoskeletal:  Negative for arthralgias and back pain.  Skin:  Negative for color change and rash.  Neurological:   Negative for seizures and syncope.  All other systems reviewed and are negative.   Physical Exam Updated Vital Signs BP 113/69   Pulse 83   Resp 15   Ht 1.575 m ('5\' 2"'$ )   Wt 76.7 kg   SpO2 100%   BMI 30.91 kg/m  Physical Exam Vitals and nursing note reviewed.  Constitutional:      General: She is not in acute distress.    Appearance: Normal appearance. She is well-developed.  HENT:     Head: Normocephalic and atraumatic.  Eyes:     Extraocular Movements: Extraocular movements intact.     Conjunctiva/sclera: Conjunctivae normal.     Pupils: Pupils are equal, round, and reactive to light.  Cardiovascular:     Rate and Rhythm: Normal rate and regular rhythm.     Heart sounds: No murmur heard. Pulmonary:     Effort: Pulmonary effort is normal. No respiratory distress.     Breath sounds: Normal breath sounds.  Abdominal:     General: There is no distension.     Palpations: Abdomen is soft.     Tenderness: There is abdominal tenderness. There is no guarding.  Musculoskeletal:        General: No swelling.     Cervical back: Neck supple.  Skin:    General: Skin is warm and dry.     Capillary Refill: Capillary refill takes less than 2 seconds.  Neurological:     General: No focal deficit present.     Mental Status: She is alert and oriented to person, place, and time.  Psychiatric:        Mood and Affect: Mood normal.     ED Results / Procedures / Treatments   Labs (all labs ordered are listed, but only abnormal results are displayed) Labs Reviewed  COMPREHENSIVE METABOLIC PANEL - Abnormal; Notable for the following components:      Result Value   Potassium 3.0 (*)    Glucose, Bld 144 (*)    BUN 24 (*)    All other components within normal limits  CBC WITH DIFFERENTIAL/PLATELET  LIPASE, BLOOD    EKG None  Radiology CT Abdomen Pelvis W Contrast  Result Date: 10/12/2022 CLINICAL DATA:  Abdominal pain.  History of Crohn's disease. EXAM: CT ABDOMEN AND PELVIS  WITH CONTRAST TECHNIQUE: Multidetector CT imaging of the abdomen and pelvis was performed using the standard protocol following bolus administration of intravenous contrast. RADIATION DOSE REDUCTION: This exam was performed according to the departmental dose-optimization program which includes automated exposure control, adjustment of the mA and/or kV according to patient size and/or use of iterative reconstruction technique. CONTRAST:  155m OMNIPAQUE IOHEXOL 300 MG/ML  SOLN COMPARISON:  07/21/16 FINDINGS: Lower chest: Unchanged asymmetric elevation of the left hemidiaphragm. No pleural fluid or airspace consolidation. Hepatobiliary: No focal liver abnormality is seen. No gallstones, gallbladder wall thickening, or biliary dilatation. Pancreas: Unremarkable. No pancreatic ductal dilatation or surrounding  inflammatory changes. Spleen: Normal in size without focal abnormality. Adrenals/Urinary Tract: Normal adrenal glands. No nephrolithiasis, hydronephrosis or suspicious mass. Urinary bladder appears within normal limits. Stomach/Bowel: Stomach appears within normal limits. There is no pathologic dilatation of the large or small bowel loops to suggest obstruction. Status post appendectomy. No significant bowel wall thickening, inflammation, or distension. Vascular/Lymphatic: Aortic atherosclerosis. No aneurysm. No signs of abdominopelvic adenopathy. Reproductive: Status post hysterectomy. No adnexal masses. Other: No free fluid or fluid collections. No signs of pneumoperitoneum. Musculoskeletal: Postoperative change from posterior hardware fixation of L2 through L5. No acute or suspicious osseous findings. IMPRESSION: 1. No acute findings within the abdomen or pelvis. 2. Status post appendectomy and hysterectomy. 3. 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 4. No findings to suggest active Crohn's disease at this time. No evidence for bowel  obstruction or inflammation. 5. Status post posterior hardware fixation of L2 through L5. 6.  Aortic Atherosclerosis (ICD10-I70.0). Electronically Signed   By: Kerby Moors M.D.   On: 10/12/2022 10:02    Procedures Procedures    Medications Ordered in ED Medications  0.9 %  sodium chloride infusion ( Intravenous New Bag/Given 10/12/22 0900)  potassium chloride SA (KLOR-CON M) CR tablet 40 mEq (has no administration in time range)  sodium chloride 0.9 % bolus 500 mL (500 mLs Intravenous New Bag/Given 10/12/22 0859)  ondansetron (ZOFRAN) injection 4 mg (4 mg Intravenous Given 10/12/22 0901)  iohexol (OMNIPAQUE) 300 MG/ML solution 100 mL (100 mLs Intravenous Contrast Given 10/12/22 [id number])    ED Course/ Medical Decision Making/ A&P                             Medical Decision Making Amount and/or Complexity of Data Reviewed Labs: ordered. Radiology: ordered.  Risk OTC drugs. Prescription drug management.   Patient nontoxic no acute distress.  But patient's had vomiting and diarrhea illness since Tuesday.  Along with a history of Crohn's disease.  Will get CT scan abdomen.  Will give IV fluids check CBC complete metabolic panel and lipase.  Patient also has a history of diabetes.  Abdomen with some diffuse tenderness but no guarding.  Labs without significant abnormalities other than potassium being down a little bit.  At 3.0.  Patient given oral potassium here will be discharged home on potassium.  Renal function normal no significant electrolyte abnormalities otherwise.  No leukocytosis.  CT abdomen pelvis without any acute findings.   Final Clinical Impression(s) / ED Diagnoses Final diagnoses:  Gastroenteritis  Hypokalemia  Generalized abdominal pain    Rx / DC Orders ED Discharge Orders          Ordered    loperamide (IMODIUM A-D) 2 MG tablet  4 times daily PRN        10/12/22 1026    ondansetron (ZOFRAN-ODT) 4 MG disintegrating tablet  Every 8 hours PRN        10/12/22  1026              Fredia Sorrow, MD 10/12/22 [id number]    Fredia Sorrow, MD 10/12/22 [id number]    Fredia Sorrow, MD 10/12/22 1027

## 2022-10-12 NOTE — Discharge Instructions (Signed)
Take the dissolvable Zofran as needed for nausea vomiting.  Recommend a course of Imodium A-D.  Workup here today labs normal other than potassium being slightly down but renal function was normal no evidence of any significant dehydration.  Take the potassium supplement as directed.  Make an appointment to be seen in person with your primary care doctor later this week.  Return for any new or worse symptoms.

## 2022-10-12 NOTE — ED Triage Notes (Signed)
Patient c/o abd pain that started on Tuesday with nausea, vomiting, and diarrhea. Denies any fever or urinary symptoms. Patient now has fatigue from all the symptoms.

## 2022-10-14 ENCOUNTER — Ambulatory Visit
Admission: RE | Admit: 2022-10-14 | Discharge: 2022-10-14 | Disposition: A | Discharge status: Home / Self Care | Payer: Medicare PPO | Source: Ambulatory Visit | Attending: Family Medicine | Admitting: Family Medicine

## 2022-10-14 DIAGNOSIS — Z1231 Encounter for screening mammogram for malignant neoplasm of breast: Secondary | ICD-10-CM

## 2022-10-16 ENCOUNTER — Telehealth: Payer: Self-pay

## 2022-10-16 NOTE — Telephone Encounter (Signed)
     Patient  visit on 10/12/2022  at [facility] was for abdominal pain.  Have you been able to follow up with your primary care physician? Patient has appointment this Thursday.  The patient was or was not able to obtain any needed medicine or equipment. Patient was able to obtain medication.  Are there diet recommendations that you are having difficulty following? No  Patient expresses understanding of discharge instructions and education provided has no other needs at this time. Yes   [facility] Resource Care Guide   [email or web address]  ?? [email or web address]   [email or web address]: [email or web address]  [email or web address]

## 2022-10-17 ENCOUNTER — Ambulatory Visit: Admitting: Family Medicine

## 2022-10-17 DIAGNOSIS — M4326 Fusion of spine, lumbar region: Secondary | ICD-10-CM | POA: Diagnosis not present

## 2022-10-18 ENCOUNTER — Ambulatory Visit (INDEPENDENT_AMBULATORY_CARE_PROVIDER_SITE_OTHER): Payer: Medicare PPO | Admitting: Family Medicine

## 2022-10-18 ENCOUNTER — Encounter: Payer: Self-pay | Admitting: Family Medicine

## 2022-10-18 VITALS — BP 127/72 | HR 72 | Ht 62.0 in | Wt 164.0 lb

## 2022-10-18 DIAGNOSIS — E785 Hyperlipidemia, unspecified: Secondary | ICD-10-CM | POA: Diagnosis not present

## 2022-10-18 DIAGNOSIS — E1169 Type 2 diabetes mellitus with other specified complication: Secondary | ICD-10-CM | POA: Diagnosis not present

## 2022-10-18 DIAGNOSIS — I152 Hypertension secondary to endocrine disorders: Secondary | ICD-10-CM | POA: Diagnosis not present

## 2022-10-18 DIAGNOSIS — R6889 Other general symptoms and signs: Secondary | ICD-10-CM | POA: Diagnosis not present

## 2022-10-18 DIAGNOSIS — E1159 Type 2 diabetes mellitus with other circulatory complications: Secondary | ICD-10-CM | POA: Diagnosis not present

## 2022-10-18 DIAGNOSIS — Z794 Long term (current) use of insulin: Secondary | ICD-10-CM | POA: Diagnosis not present

## 2022-10-18 DIAGNOSIS — K529 Noninfective gastroenteritis and colitis, unspecified: Secondary | ICD-10-CM

## 2022-10-18 LAB — LIPID PANEL
Chol/HDL Ratio: 2 ratio (ref 0.0–4.4)
Cholesterol, Total: 135 mg/dL (ref 100–199)
HDL: 67 mg/dL (ref >39)
LDL Chol Calc (NIH): 58 mg/dL (ref 0–99)
Triglycerides: 44 mg/dL (ref 0–149)
VLDL Cholesterol Cal: 10 mg/dL (ref 5–40)

## 2022-10-18 LAB — BAYER DCA HB A1C WAIVED: HB A1C (BAYER DCA - WAIVED): 7.4 % — ABNORMAL HIGH (ref 4.8–5.6)

## 2022-10-18 MED ORDER — ATORVASTATIN CALCIUM 40 MG PO TABS: 40.0000 mg | ORAL_TABLET | Freq: Every day | ORAL | 3 refills | Status: DC

## 2022-10-18 NOTE — Progress Notes (Signed)
BP 127/72   Pulse 72   Ht '5\' 2"'$  (1.575 m)   Wt 164 lb (74.4 kg)   SpO2 99%   BMI 30.00 kg/m    Subjective:   Patient ID: Felicia Pugh, female    DOB: [birth year], 77 y.o.   MRN: [id number]  HPI: Felicia Pugh is a 77 y.o. female presenting on 10/18/2022 for Medical Management of Chronic Issues and Diabetes   HPI Type 2 diabetes mellitus Patient comes in today for recheck of his diabetes. Patient has been currently taking [place] and Glyxambi. Patient is not currently on an ACE inhibitor/ARB. Patient has not seen an ophthalmologist this year. Patient denies any issues with their feet. The symptom started onset as an adult hypertension and hyperlipidemia ARE RELATED TO DM   Hypertension Patient is currently on no blood pressure pills, diet control, and their blood pressure today is 127/72. Patient denies any lightheadedness or dizziness. Patient denies headaches, blurred vision, chest pains, shortness of breath, or weakness. Denies any side effects from medication and is content with current medication.   Hyperlipidemia Patient is coming in for recheck of his hyperlipidemia. The patient is currently taking Lipitor. They deny any issues with myalgias or history of liver damage from it. They deny any focal numbness or weakness or chest pain.   Relevant past medical, surgical, family and social history reviewed and updated as indicated. Interim medical history since our last visit reviewed. Allergies and medications reviewed and updated.  Review of Systems  Constitutional:  Negative for chills and fever.  Eyes:  Negative for visual disturbance.  Respiratory:  Negative for chest tightness and shortness of breath.   Cardiovascular:  Negative for chest pain and leg swelling.  Musculoskeletal:  Negative for back pain and gait problem.  Skin:  Negative for rash.  Neurological:  Negative for light-headedness and headaches.  Psychiatric/Behavioral:  Negative for agitation and behavioral  problems.   All other systems reviewed and are negative.   Per HPI unless specifically indicated above   Allergies as of 10/18/2022       Reactions   Metformin And Related    Severe diarrhea   Asa [aspirin]    GI Dr does not want her to take due to hx ulcers   Nsaids Other (See Comments)   GI Dr does not want her to take due to hx of ulcers Instructed by GI not to take    Other Diarrhea   Steroids   Tolmetin Other (See Comments)   GI Dr does not want her to take due to hx of ulcers GI Dr does not want her to take due to hx of ulcers        Medication List        Accurate as of October 18, 2022 10:02 AM. If you have any questions, ask your nurse or doctor.          STOP taking these medications    HYDROcodone-acetaminophen 10-325 MG tablet Commonly known as: NORCO Stopped by: Fransisca Kaufmann Smantha Boakye, MD   loperamide 2 MG capsule Commonly known as: IMODIUM Stopped by: Worthy Rancher, MD   loperamide 2 MG tablet Commonly known as: Imodium A-D Stopped by: Worthy Rancher, MD   losartan 25 MG tablet Commonly known as: COZAAR Stopped by: Worthy Rancher, MD   ondansetron 4 MG tablet Commonly known as: Zofran Stopped by: Fransisca Kaufmann Amauris Debois, MD   potassium chloride SA 20 MEQ tablet Commonly known as: Rhetta Mura  Stopped by: Worthy Rancher, MD       TAKE these medications    atorvastatin 40 MG tablet Commonly known as: LIPITOR Take 1 tablet (40 mg total) by mouth daily.   balsalazide 750 MG capsule Commonly known as: COLAZAL Take 2 capsules (1,500 mg total) by mouth in the morning and at bedtime.   ciclopirox 8 % solution Commonly known as: PENLAC APPLY EVERY DAY TO SPLIT TOENAIL ON RIGHT BIG TOE. CLEANSE WITH NAIL POLISH REMOVER ONCE A WEEK.   Glyxambi 25-5 MG Tabs Generic drug: Empagliflozin-linaGLIPtin Take 1 tablet by mouth daily.   Insulin Syringe-Needle U-100 31G X 1/4" 1 ML Misc 1 Syringe by Does not apply route 2 (two) times  daily.   methocarbamol 500 MG tablet Commonly known as: ROBAXIN Take 500 mg by mouth every 8 (eight) hours as needed.   nystatin powder Generic drug: nystatin APPLY POWDER TOPICALLY TO AFFECTED AREA THREE TIMES DAILY   ondansetron 4 MG disintegrating tablet Commonly known as: ZOFRAN-ODT Take 1 tablet (4 mg total) by mouth every 8 (eight) hours as needed for nausea or vomiting.   OneTouch Verio test strip Generic drug: glucose blood USE 1 STRIP TO CHECK GLUCOSE TWICE DAILY   Pen Needles 31G X 5 MM Misc Use to give insulin with flexpen   Tresiba FlexTouch 100 UNIT/ML FlexTouch Pen Generic drug: insulin degludec INJECT 40 UNITS SUBCUTANEOUSLY ONCE DAILY   WOMENS 50+ MULTI VITAMIN/MIN PO Take by mouth.         Objective:   BP 127/72   Pulse 72   Ht '5\' 2"'$  (1.575 m)   Wt 164 lb (74.4 kg)   SpO2 99%   BMI 30.00 kg/m   Wt Readings from Last 3 Encounters:  10/18/22 164 lb (74.4 kg)  10/12/22 169 lb (76.7 kg)  07/09/22 165 lb 9.6 oz (75.1 kg)    Physical Exam Vitals and nursing note reviewed.  Constitutional:      General: She is not in acute distress.    Appearance: She is well-developed. She is not diaphoretic.  Eyes:     Conjunctiva/sclera: Conjunctivae normal.  Cardiovascular:     Rate and Rhythm: Normal rate and regular rhythm.     Heart sounds: Normal heart sounds. No murmur heard. Pulmonary:     Effort: Pulmonary effort is normal. No respiratory distress.     Breath sounds: Normal breath sounds. No wheezing.  Musculoskeletal:        General: No swelling or tenderness. Normal range of motion.  Skin:    General: Skin is warm and dry.     Findings: No rash.  Neurological:     Mental Status: She is alert and oriented to person, place, and time.     Coordination: Coordination normal.  Psychiatric:        Behavior: Behavior normal.       Assessment & Plan:   Problem List Items Addressed This Visit       Cardiovascular and Mediastinum    Hypertension associated with diabetes ([place])   Relevant Medications   atorvastatin (LIPITOR) 40 MG tablet     Endocrine   Hyperlipidemia associated with type 2 diabetes mellitus (HCC)   Relevant Medications   atorvastatin (LIPITOR) 40 MG tablet   Other Relevant Orders   Lipid panel   Bayer DCA Hb A1c Waived   Type 2 diabetes mellitus with other specified complication (HCC) - Primary   Relevant Medications   atorvastatin (LIPITOR) 40 MG tablet  Other Relevant Orders   Lipid panel   Bayer DCA Hb A1c Waived   Other Visit Diagnoses     Gastroenteritis       Relevant Orders   BMP8+EGFR       A1c 7.4, encourage diet, slightly improved from last time which was 7.6.  Blood pressure and heart rate everything else looks good today.  No changes in medicine but just focus on diet Follow up plan: Return in about 3 months (around 01/18/2023), or if symptoms worsen or fail to improve, for Diabetes hypertension.  Counseling provided for all of the vaccine components Orders Placed This Encounter  Procedures   Lipid panel   Bayer [place] Hb A1c Waived   [place], MD [facility] 10/18/2022, 10:02 AM

## 2022-10-22 LAB — BASIC METABOLIC PANEL
BUN/Creatinine Ratio: 13 (ref 12–28)
BUN: 9 mg/dL (ref 8–27)
CO2: 25 mmol/L (ref 20–29)
Calcium: 8.9 mg/dL (ref 8.7–10.3)
Chloride: 107 mmol/L — ABNORMAL HIGH (ref 96–106)
Creatinine, Ser: 0.67 mg/dL (ref 0.57–1.00)
Glucose: 72 mg/dL (ref 70–99)
Potassium: 3.7 mmol/L (ref 3.5–5.2)
Sodium: 147 mmol/L — ABNORMAL HIGH (ref 134–144)
eGFR: 91 mL/min/{1.73_m2} (ref >59)

## 2022-10-22 LAB — SPECIMEN STATUS REPORT

## 2022-10-26 DIAGNOSIS — R42 Dizziness and giddiness: Secondary | ICD-10-CM | POA: Diagnosis not present

## 2022-10-26 DIAGNOSIS — J329 Chronic sinusitis, unspecified: Secondary | ICD-10-CM | POA: Diagnosis not present

## 2022-10-26 DIAGNOSIS — J020 Streptococcal pharyngitis: Secondary | ICD-10-CM | POA: Diagnosis not present

## 2022-10-26 DIAGNOSIS — J029 Acute pharyngitis, unspecified: Secondary | ICD-10-CM | POA: Diagnosis not present

## 2022-10-26 DIAGNOSIS — R509 Fever, unspecified: Secondary | ICD-10-CM | POA: Diagnosis not present

## 2022-10-26 DIAGNOSIS — Z20818 Contact with and (suspected) exposure to other bacterial communicable diseases: Secondary | ICD-10-CM | POA: Diagnosis not present

## 2022-10-26 DIAGNOSIS — R52 Pain, unspecified: Secondary | ICD-10-CM | POA: Diagnosis not present

## 2022-11-04 NOTE — Progress Notes (Signed)
GI Office Note    Referring Provider: Dettinger, Elige Radon, MD Primary Care Physician:  Dettinger, Elige Radon, MD  Primary Gastroenterologist:Michael Rourk, MD   Chief Complaint   Chief Complaint  Patient presents with   Follow-up    History of Present Illness   Felicia Pugh is a 77 y.o. female presenting today for follow up Crohn's. Last seen in 05/2022. 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 Also with history of osteopenia noted on DEXA scan in 2018, but normal in 2022.  She continues to recover from her fourth back surgery (end of 03/2022). She is now helping to raise her four year old grandson who lost his mother. She feels well. She is pleased with her stomach/bowels and how things are going. She has having regular BMs. No melena, brbpr. No abdominal pain. No heartburn, vomiting.   She reports couple of recent illness, went to ED first of this month for acute onset N/V/D (suspected viral). She also was treated for Strep throat two weeks ago.  CT A/P with contrast 10/2022: IMPRESSION: 1. No acute findings within the abdomen or pelvis. 2. Status post appendectomy and hysterectomy. 3. 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 4. No findings to suggest active Crohn's disease at this time. No evidence for bowel obstruction or inflammation. 5. Status post posterior hardware fixation of L2 through L5. 6.  Aortic Atherosclerosis (ICD10-I70.0).  Colonoscopy October 2018: - abnormal ileocecal valve?biopsied, focal active ileitis on biopsy - Diverticulosis in the sigmoid colon and in the descending colon. The examination was otherwise  normal on direct and retroflexion views. - Non-bleeding internal hemorrhoids. -5 year surveillance colonoscopy recommended   Medications   Current Outpatient Medications  Medication Sig Dispense Refill   atorvastatin (LIPITOR) 40 MG tablet Take 1 tablet (40 mg total) by mouth daily. 90 tablet 3   balsalazide (COLAZAL) 750 MG capsule Take 2 capsules (1,500 mg total) by mouth in the morning and at bedtime. 360 capsule 3   ciclopirox (PENLAC) 8 % solution APPLY EVERY DAY TO SPLIT TOENAIL ON RIGHT BIG TOE. CLEANSE WITH NAIL POLISH REMOVER ONCE A WEEK.     Empagliflozin-linaGLIPtin (GLYXAMBI) 25-5 MG TABS Take 1 tablet by mouth daily. 90 tablet 3   glucose blood (ONETOUCH VERIO) test strip USE 1 STRIP TO CHECK GLUCOSE TWICE DAILY 200 each 2   insulin degludec (TRESIBA FLEXTOUCH) 100 UNIT/ML FlexTouch Pen INJECT 40 UNITS SUBCUTANEOUSLY ONCE DAILY 30 mL 1   Insulin Pen Needle (PEN NEEDLES) 31G X 5 MM MISC Use to give insulin with flexpen 100 each 1   Insulin Syringe-Needle U-100 31G X 1/4" 1 ML MISC 1 Syringe by Does not apply route 2 (two) times daily. 60 each 6   losartan (COZAAR) 25 MG tablet Take by mouth.     Multiple Vitamins-Minerals (WOMENS 50+ MULTI VITAMIN/MIN PO) Take by mouth.     nystatin powder APPLY POWDER TOPICALLY TO AFFECTED AREA THREE TIMES DAILY     No current facility-administered medications for this visit.    Allergies   Allergies as of 11/05/2022 - Review Complete 11/05/2022  Allergen Reaction Noted   Metformin and related  05/11/2014   Asa [aspirin]  11/04/2012   Nsaids Other (See Comments) 11/04/2012  Other Diarrhea 12/25/2014   Tolmetin Other (See Comments) 11/04/2012        Review of Systems   General: Negative for anorexia, weight loss, fever, chills, fatigue, weakness. ENT: Negative for hoarseness, difficulty swallowing , nasal congestion. CV: Negative for chest pain, angina, palpitations, dyspnea on exertion, peripheral edema.  Respiratory: Negative  for dyspnea at rest, dyspnea on exertion, cough, sputum, wheezing.  GI: See history of present illness. GU:  Negative for dysuria, hematuria, urinary incontinence, urinary frequency, nocturnal urination.  Endo: Negative for unusual weight change.     Physical Exam   BP 139/80 (BP Location: Right Arm, Patient Position: Sitting, Cuff Size: Normal)   Pulse 87   Temp (!) 97.5 F (36.4 C) (Temporal)   Ht 5' 2.5" (1.588 m)   Wt 162 lb (73.5 kg)   SpO2 91%   BMI 29.16 kg/m    General: Well-nourished, well-developed in no acute distress.  Eyes: No icterus. Mouth: Oropharyngeal mucosa moist and pink   Abdomen: Bowel sounds are normal, nontender, nondistended, no hepatosplenomegaly or masses,  no abdominal bruits or hernia , no rebound or guarding.  Rectal: not performed Extremities: No lower extremity edema. No clubbing or deformities. Neuro: Alert and oriented x 4   Skin: Warm and dry, no jaundice.   Psych: Alert and cooperative, normal mood and affect.  Labs   Lab Results  Component Value Date   CREATININE 0.67 10/18/2022   BUN 9 10/18/2022   NA 147 (H) 10/18/2022   K 3.7 10/18/2022   CL 107 (H) 10/18/2022   CO2 25 10/18/2022   Lab Results  Component Value Date   ALT 16 10/12/2022   AST 25 10/12/2022   ALKPHOS 112 10/12/2022   BILITOT 0.6 10/12/2022   Lab Results  Component Value Date   WBC 6.5 10/12/2022   HGB 12.9 10/12/2022   HCT 42.6 10/12/2022   MCV 91.0 10/12/2022   PLT 238 10/12/2022   Lab Results  Component Value Date   LIPASE 51 10/12/2022   Lab Results  Component Value Date   HGBA1C 7.4 (H) 10/18/2022    Imaging Studies   MM 3D SCREEN BREAST BILATERAL  Result Date: 10/16/2022 CLINICAL DATA:  Screening. EXAM: DIGITAL SCREENING BILATERAL MAMMOGRAM WITH TOMOSYNTHESIS AND CAD TECHNIQUE: Bilateral screening digital craniocaudal and mediolateral oblique mammograms were obtained. Bilateral screening digital breast tomosynthesis was performed. The images  were evaluated with computer-aided detection. COMPARISON:  Previous exam(s). ACR Breast Density Category a: The breasts are almost entirely fatty. FINDINGS: There are no findings suspicious for malignancy. IMPRESSION: No mammographic evidence of malignancy. A result letter of this screening mammogram will be mailed directly to the patient. RECOMMENDATION: Screening mammogram in one year. (Code:[id number]-B-01Y) BI-RADS CATEGORY  1: Negative. Electronically Signed   By: Edwin Cap M.D.   On: 10/16/2022 14:17   CT Abdomen Pelvis W Contrast  Result Date: 10/12/2022 CLINICAL DATA:  Abdominal pain.  History of Crohn's disease. EXAM: CT ABDOMEN AND PELVIS WITH CONTRAST TECHNIQUE: Multidetector CT imaging of the abdomen and pelvis was performed using the standard protocol following bolus administration of intravenous contrast. RADIATION DOSE REDUCTION: This exam was performed according to the departmental dose-optimization program which includes automated exposure control, adjustment of the mA and/or kV according to patient size and/or use of iterative reconstruction technique. CONTRAST:  OMNIPAQUE IOHEXOL 300 MG/ML  SOLN COMPARISON:  07/21/16 FINDINGS: Lower chest: Unchanged asymmetric elevation of the left hemidiaphragm. No pleural fluid or airspace consolidation. Hepatobiliary: No focal liver abnormality  is seen. No gallstones, gallbladder wall thickening, or biliary dilatation. Pancreas: Unremarkable. No pancreatic ductal dilatation or surrounding inflammatory changes. Spleen: Normal in size without focal abnormality. Adrenals/Urinary Tract: Normal adrenal glands. No nephrolithiasis, hydronephrosis or suspicious mass. Urinary bladder appears within normal limits. Stomach/Bowel: Stomach appears within normal limits. There is no pathologic dilatation of the large or small bowel loops to suggest obstruction. Status post appendectomy. No significant bowel wall thickening, inflammation, or distension.  Vascular/Lymphatic: Aortic atherosclerosis. No aneurysm. No signs of abdominopelvic adenopathy. Reproductive: Status post hysterectomy. No adnexal masses. Other: No free fluid or fluid collections. No signs of pneumoperitoneum. Musculoskeletal: Postoperative change from posterior hardware fixation of L2 through L5. No acute or suspicious osseous findings. IMPRESSION: 1. No acute findings within the abdomen or pelvis. 2. Status post appendectomy and hysterectomy. 3. 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 4. No findings to suggest active Crohn's disease at this time. No evidence for bowel obstruction or inflammation. 5. Status post posterior hardware fixation of L2 through L5. 6.  Aortic Atherosclerosis (ICD10-I70.0). Electronically Signed   By: Signa Kell M.D.   On: 10/12/2022 10:02    Assessment   Crohn's: ileocolonic, mostly involving ICV. Doing well. Recent CT as outlined above with no findings to suggest active Crohn's. She is overdue for 5 year surveillance colonoscopy, has been postponed while recovering from her back surgery. She would like to postpone until after next ov.    PLAN   Continue Colazal 1.5 gram BID. Return ov in six months, plan for colonoscopy at that time per patient request.    Leanna Battles. Melvyn Neth, MHS, PA-C [facility]

## 2022-11-05 ENCOUNTER — Encounter: Payer: Self-pay | Admitting: Gastroenterology

## 2022-11-05 ENCOUNTER — Ambulatory Visit (INDEPENDENT_AMBULATORY_CARE_PROVIDER_SITE_OTHER): Payer: Medicare PPO | Admitting: Gastroenterology

## 2022-11-05 VITALS — BP 139/80 | HR 87 | Temp 97.5°F | Ht 62.5 in | Wt 162.0 lb

## 2022-11-05 DIAGNOSIS — K5080 Crohn's disease of both small and large intestine without complications: Secondary | ICD-10-CM | POA: Diagnosis not present

## 2022-11-05 NOTE — Patient Instructions (Signed)
Continue balsalazide (Colazal) 2 capsules twice a day.  We will see you back in six months and plan for colonoscopy at that time.  Please call if you have any questions or concerns.

## 2022-11-21 LAB — HM DIABETES EYE EXAM

## 2022-12-03 ENCOUNTER — Encounter: Payer: Self-pay | Admitting: Family

## 2022-12-03 ENCOUNTER — Ambulatory Visit (INDEPENDENT_AMBULATORY_CARE_PROVIDER_SITE_OTHER): Payer: Medicare PPO | Admitting: Family

## 2022-12-03 ENCOUNTER — Ambulatory Visit (INDEPENDENT_AMBULATORY_CARE_PROVIDER_SITE_OTHER): Payer: Medicare PPO

## 2022-12-03 VITALS — BP 124/71 | HR 91 | Temp 97.5°F | Ht 62.5 in | Wt 163.0 lb

## 2022-12-03 DIAGNOSIS — M25561 Pain in right knee: Secondary | ICD-10-CM

## 2022-12-03 DIAGNOSIS — E1169 Type 2 diabetes mellitus with other specified complication: Secondary | ICD-10-CM | POA: Diagnosis not present

## 2022-12-03 DIAGNOSIS — Z794 Long term (current) use of insulin: Secondary | ICD-10-CM

## 2022-12-03 MED ORDER — BUPIVACAINE HCL 0.25 % IJ SOLN
1.0000 mL | Freq: Once | INTRAMUSCULAR | Status: AC
  Administered 2022-12-03: 1 mL via INTRA_ARTICULAR

## 2022-12-03 MED ORDER — METHYLPREDNISOLONE ACETATE 80 MG/ML IJ SUSP
80.0000 mg | Freq: Once | INTRAMUSCULAR | Status: AC
  Administered 2022-12-03: 80 mg via INTRA_ARTICULAR

## 2022-12-03 NOTE — Progress Notes (Signed)
Subjective:    Patient ID: Felicia Pugh, female    DOB: Mar 11, 1946, 77 y.o.   MRN: [id number]  . Chief Complaint  Patient presents with   Knee Pain    Right knee fell on it a month ago. When she lifts it it hurts right in the middle.   PT presents to the office today with right knee pain that started 4 days ago. States she fell over a month ago, reports she had some soreness and resolved.   PT is a diabetic and last A1C was 7.4. Knee Pain  The incident occurred 3 to 5 days ago. There was no injury mechanism. The pain is present in the right knee. The quality of the pain is described as aching. The pain is at a severity of 7/10 (when moving 9 out 10). The pain is moderate. The pain has been Intermittent since onset. Pertinent negatives include no loss of motion, loss of sensation, muscle weakness, numbness or tingling. She reports no foreign bodies present. The symptoms are aggravated by movement and weight bearing. She has tried non-weight bearing and acetaminophen for the symptoms. The treatment provided mild relief.      Review of Systems  Neurological:  Negative for tingling and numbness.  All other systems reviewed and are negative.      Objective:   Physical Exam Vitals reviewed.  Constitutional:      General: She is not in acute distress.    Appearance: She is well-developed.  HENT:     Head: Normocephalic and atraumatic.  Eyes:     Pupils: Pupils are equal, round, and reactive to light.  Neck:     Thyroid: No thyromegaly.  Cardiovascular:     Rate and Rhythm: Normal rate and regular rhythm.     Heart sounds: Normal heart sounds. No murmur heard. Pulmonary:     Effort: Pulmonary effort is normal. No respiratory distress.     Breath sounds: Normal breath sounds. No wheezing.  Abdominal:     General: Bowel sounds are normal. There is no distension.     Palpations: Abdomen is soft. There is no mass.     Tenderness: There is no abdominal tenderness.   Musculoskeletal:        General: Tenderness present.     Cervical back: Normal range of motion and neck supple.     Comments: Pain in right knee with flexion and extension   Skin:    General: Skin is warm and dry.  Neurological:     Mental Status: She is alert and oriented to person, place, and time.     Cranial Nerves: No cranial nerve deficit.     Deep Tendon Reflexes: Reflexes are normal and symmetric.  Psychiatric:        Behavior: Behavior normal.        Thought Content: Thought content normal.        Judgment: Judgment normal.     rightknee prepped with betadine Injected with Marcaine .25% plain and methylprednisolone with 25 guage needle x 1. Patient tolerated well.   BP 124/71   Pulse 91   Temp (!) 97.5 F (36.4 C) (Temporal)   Ht 5' 2.5" (1.588 m)   Wt 163 lb (73.9 kg)   SpO2 99%   BMI 29.34 kg/m      Assessment & Plan:  Felicia Pugh comes in today with chief complaint of Knee Pain (Right knee fell on it a month ago. When she lifts it it  hurts right in the middle.)   Diagnosis and orders addressed:  1. Acute pain of right knee - DG Knee 1-2 Views Right - Ambulatory referral to Physical Therapy - methylPREDNISolone acetate (DEPO-MEDROL) injection 80 mg - bupivacaine (MARCAINE) 0.25 % (with pres) injection 1 mL  2. Type 2 diabetes mellitus with other specified complication, with long-term current use of insulin   Injection given today  Strict low carb diet  Referral to PT placed. Pt is a caregiver to multiple people. Discussed she could go and get home exercises to help.  ROM exercises encouraged Rest Keep follow up with PCP   Jannifer Rodney, FNP

## 2022-12-03 NOTE — Patient Instructions (Signed)
Acute Knee Pain, Adult Acute knee pain is sudden and may be caused by damage, swelling, or irritation of the muscles and tissues that support the knee. Pain may result from: A fall. An injury to the knee from twisting motions. A hit to the knee. Infection. Acute knee pain may go away on its own with time and rest. If it does not, your health care provider may order tests to find the cause of the pain. These may include: Imaging tests, such as an X-ray, MRI, CT scan, or ultrasound. Joint aspiration. In this test, fluid is removed from the knee and evaluated. Arthroscopy. In this test, a lighted tube is inserted into the knee and an image is projected onto a TV screen. Biopsy. In this test, a sample of tissue is removed from the body and studied under a microscope. Follow these instructions at home: If you have a knee sleeve or brace:  Wear the knee sleeve or brace as told by your health care provider. Remove it only as told by your health care provider. Loosen it if your toes tingle, become numb, or turn cold and blue. Keep it clean. If the knee sleeve or brace is not waterproof: Do not let it get wet. Cover it with a watertight covering when you take a bath or shower. Activity Rest your knee. Do not do things that cause pain or make pain worse. Avoid high-impact activities or exercises, such as running, jumping rope, or doing jumping jacks. Work with a physical therapist to make a safe exercise program, as recommended by your health care provider. Do exercises as told by your physical therapist. Managing pain, stiffness, and swelling  If directed, put ice on the affected knee. To do this: If you have a removable knee sleeve or brace, remove it as told by your health care provider. Put ice in a plastic bag. Place a towel between your skin and the bag. Leave the ice on for 20 minutes, 2-3 times a day. Remove the ice if your skin turns bright red. This is very important. If you cannot  feel pain, heat, or cold, you have a greater risk of damage to the area. If directed, use an elastic bandage to put pressure (compression) on your injured knee. This may control swelling, give support, and help with discomfort. Raise (elevate) your knee above the level of your heart while you are sitting or lying down. Sleep with a pillow under your knee. General instructions Take over-the-counter and prescription medicines only as told by your health care provider. Do not use any products that contain nicotine or tobacco, such as cigarettes, e-cigarettes, and chewing tobacco. If you need help quitting, ask your health care provider. If you are overweight, work with your health care provider and a dietitian to set a weight-loss goal that is healthy and reasonable for you. Extra weight can put pressure on your knee. Pay attention to any changes in your symptoms. Keep all follow-up visits. This is important. Contact a health care provider if: Your knee pain continues, changes, or gets worse. You have a fever along with knee pain. Your knee feels warm to the touch or is red. Your knee buckles or locks up. Get help right away if: Your knee swells, and the swelling becomes worse. You cannot move your knee. You have severe pain in your knee that cannot be managed with pain medicine. Summary Acute knee pain can be caused by a fall, an injury, an infection, or damage, swelling, or irritation   of the tissues that support your knee. Your health care provider may perform tests to find out the cause of the pain. Pay attention to any changes in your symptoms. Relieve your pain with rest, medicines, light activity, and the use of ice. Get help right away if your knee swells, you cannot move your knee, or you have severe pain that cannot be managed with medicine. This information is not intended to replace advice given to you by your health care provider. Make sure you discuss any questions you have with  your health care provider. Document Revised: 01/12/2020 Document Reviewed: 01/12/2020 Elsevier Patient Education  2023 [employer]  

## 2022-12-17 DIAGNOSIS — B351 Tinea unguium: Secondary | ICD-10-CM | POA: Diagnosis not present

## 2022-12-17 DIAGNOSIS — E1142 Type 2 diabetes mellitus with diabetic polyneuropathy: Secondary | ICD-10-CM | POA: Diagnosis not present

## 2022-12-17 DIAGNOSIS — M79676 Pain in unspecified toe(s): Secondary | ICD-10-CM | POA: Diagnosis not present

## 2022-12-17 DIAGNOSIS — L84 Corns and callosities: Secondary | ICD-10-CM | POA: Diagnosis not present

## 2022-12-20 ENCOUNTER — Other Ambulatory Visit: Payer: Self-pay | Admitting: Family Medicine

## 2022-12-20 DIAGNOSIS — Z794 Long term (current) use of insulin: Secondary | ICD-10-CM

## 2022-12-25 ENCOUNTER — Other Ambulatory Visit: Payer: Self-pay | Admitting: Family Medicine

## 2022-12-25 DIAGNOSIS — Z794 Long term (current) use of insulin: Secondary | ICD-10-CM

## 2023-01-03 ENCOUNTER — Other Ambulatory Visit: Payer: Self-pay | Admitting: Family Medicine

## 2023-01-03 NOTE — Telephone Encounter (Signed)
Seen 12/03/22 with Jannifer Rodney Medication on med list as historical, requires provider approval per protocol

## 2023-01-20 ENCOUNTER — Ambulatory Visit

## 2023-01-23 ENCOUNTER — Ambulatory Visit: Admitting: Family Medicine

## 2023-02-26 ENCOUNTER — Encounter: Payer: Self-pay | Admitting: Family Medicine

## 2023-02-26 ENCOUNTER — Ambulatory Visit: Payer: Medicare PPO | Admitting: Family Medicine

## 2023-02-26 VITALS — BP 132/81 | HR 85 | Ht 62.5 in | Wt 163.0 lb

## 2023-02-26 DIAGNOSIS — E785 Hyperlipidemia, unspecified: Secondary | ICD-10-CM

## 2023-02-26 DIAGNOSIS — I152 Hypertension secondary to endocrine disorders: Secondary | ICD-10-CM | POA: Diagnosis not present

## 2023-02-26 DIAGNOSIS — E1159 Type 2 diabetes mellitus with other circulatory complications: Secondary | ICD-10-CM | POA: Diagnosis not present

## 2023-02-26 DIAGNOSIS — Z794 Long term (current) use of insulin: Secondary | ICD-10-CM

## 2023-02-26 DIAGNOSIS — E1169 Type 2 diabetes mellitus with other specified complication: Secondary | ICD-10-CM | POA: Diagnosis not present

## 2023-02-26 LAB — BAYER DCA HB A1C WAIVED: HB A1C (BAYER DCA - WAIVED): 7.2 % — ABNORMAL HIGH (ref 4.8–5.6)

## 2023-02-26 MED ORDER — TRESIBA FLEXTOUCH 100 UNIT/ML ~~LOC~~ SOPN: PEN_INJECTOR | SUBCUTANEOUS | 3 refills | Status: DC

## 2023-02-26 MED ORDER — GLYXAMBI 25-5 MG PO TABS: 1.0000 | ORAL_TABLET | Freq: Every day | ORAL | 3 refills | Status: DC

## 2023-02-26 MED ORDER — "INSULIN SYRINGE-NEEDLE U-100 31G X 1/4"" 1 ML MISC": 1.0000 | Freq: Two times a day (BID) | 6 refills | Status: AC

## 2023-02-26 MED ORDER — LOSARTAN POTASSIUM 25 MG PO TABS: 25.0000 mg | ORAL_TABLET | Freq: Every day | ORAL | 3 refills | Status: DC

## 2023-02-26 MED ORDER — PEN NEEDLES 31G X 5 MM MISC: 1 refills | Status: DC

## 2023-02-26 MED ORDER — ONETOUCH VERIO VI STRP: ORAL_STRIP | 2 refills | Status: DC

## 2023-02-26 NOTE — Progress Notes (Signed)
BP 132/81   Pulse 85   Ht 5' 2.5" (1.588 m)   Wt 163 lb (73.9 kg)   SpO2 96%   BMI 29.34 kg/m    Subjective:   Patient ID: Felicia Pugh, female    DOB: 08-18-1945, 76 y.o.   MRN: [id number]  HPI: Felicia Pugh is a 77 y.o. female presenting on 02/26/2023 for Medical Management of Chronic Issues and Diabetes   HPI Type 2 diabetes mellitus Patient comes in today for recheck of his diabetes. Patient has been currently taking Glyxambi and [place]. Patient is currently on an ACE inhibitor/ARB. Patient has not seen an ophthalmologist this year. Patient denies any new issues with their feet, has some onychomycosis but sees podiatry for this. The symptom started onset as an adult hypertension and hyperlipidemia ARE RELATED TO DM   Hypertension Patient is currently on losartan, and their blood pressure today is 132/81. Patient denies any lightheadedness or dizziness. Patient denies headaches, blurred vision, chest pains, shortness of breath, or weakness. Denies any side effects from medication and is content with current medication.   Hyperlipidemia Patient is coming in for recheck of his hyperlipidemia. The patient is currently taking atorvastatin. They deny any issues with myalgias or history of liver damage from it. They deny any focal numbness or weakness or chest pain.   Relevant past medical, surgical, family and social history reviewed and updated as indicated. Interim medical history since our last visit reviewed. Allergies and medications reviewed and updated.  Review of Systems  Constitutional:  Negative for chills and fever.  HENT:  Negative for congestion, ear discharge and ear pain.   Eyes:  Negative for redness and visual disturbance.  Respiratory:  Negative for chest tightness and shortness of breath.   Cardiovascular:  Negative for chest pain and leg swelling.  Genitourinary:  Negative for difficulty urinating and dysuria.  Musculoskeletal:  Negative for back pain and  gait problem.  Skin:  Negative for rash.  Neurological:  Negative for light-headedness and headaches.  Psychiatric/Behavioral:  Negative for agitation and behavioral problems.   All other systems reviewed and are negative.   Per HPI unless specifically indicated above   Allergies as of 02/26/2023       Reactions   Metformin And Related    Severe diarrhea   Asa [aspirin]    GI Dr does not want her to take due to hx ulcers   Nsaids Other (See Comments)   GI Dr does not want her to take due to hx of ulcers Instructed by GI not to take    Other Diarrhea   Steroids   Tolmetin Other (See Comments)   GI Dr does not want her to take due to hx of ulcers GI Dr does not want her to take due to hx of ulcers        Medication List        Accurate as of February 26, 2023 11:06 AM. If you have any questions, ask your nurse or doctor.          atorvastatin 40 MG tablet Commonly known as: LIPITOR Take 1 tablet (40 mg total) by mouth daily.   balsalazide 750 MG capsule Commonly known as: COLAZAL Take 2 capsules (1,500 mg total) by mouth in the morning and at bedtime.   ciclopirox 8 % solution Commonly known as: PENLAC APPLY EVERY DAY TO SPLIT TOENAIL ON RIGHT BIG TOE. CLEANSE WITH NAIL POLISH REMOVER ONCE A WEEK.   Glyxambi 25-5  MG Tabs Generic drug: Empagliflozin-linaGLIPtin Take 1 tablet by mouth daily.   Insulin Syringe-Needle U-100 31G X 1/4" 1 ML Misc 1 Syringe by Does not apply route 2 (two) times daily.   losartan 25 MG tablet Commonly known as: COZAAR Take 1 tablet (25 mg total) by mouth daily.   nystatin powder APPLY POWDER TOPICALLY TO AFFECTED AREA THREE TIMES DAILY   OneTouch Verio test strip Generic drug: glucose blood USE 1 STRIP TO CHECK GLUCOSE TWICE DAILY   Pen Needles 31G X 5 MM Misc Use to give insulin with flexpen   Tresiba FlexTouch 100 UNIT/ML FlexTouch Pen Generic drug: insulin degludec INJECT 40 UNITS SUBCUTANEOUSLY ONCE DAILY   WOMENS 50+  MULTI VITAMIN/MIN PO Take by mouth.         Objective:   BP 132/81   Pulse 85   Ht 5' 2.5" (1.588 m)   Wt 163 lb (73.9 kg)   SpO2 96%   BMI 29.34 kg/m   Wt Readings from Last 3 Encounters:  02/26/23 163 lb (73.9 kg)  12/03/22 163 lb (73.9 kg)  11/05/22 162 lb (73.5 kg)    Physical Exam Vitals and nursing note reviewed.  Constitutional:      General: She is not in acute distress.    Appearance: She is well-developed. She is not diaphoretic.  Eyes:     Conjunctiva/sclera: Conjunctivae normal.  Cardiovascular:     Rate and Rhythm: Normal rate and regular rhythm.     Heart sounds: Normal heart sounds. No murmur heard. Pulmonary:     Effort: Pulmonary effort is normal. No respiratory distress.     Breath sounds: Normal breath sounds. No wheezing.  Musculoskeletal:        General: No swelling or tenderness. Normal range of motion.  Skin:    General: Skin is warm and dry.     Findings: No rash.  Neurological:     Mental Status: She is alert and oriented to person, place, and time.     Coordination: Coordination normal.  Psychiatric:        Behavior: Behavior normal.     Results for orders placed or performed in visit on 12/11/22  HM DIABETES EYE EXAM  Result Value Ref Range   HM Diabetic Eye Exam No Retinopathy No Retinopathy    Assessment & Plan:   Problem List Items Addressed This Visit       Cardiovascular and Mediastinum   Hypertension associated with diabetes (HCC)   Relevant Medications   Empagliflozin-linaGLIPtin (GLYXAMBI) 25-5 MG TABS   losartan (COZAAR) 25 MG tablet   insulin degludec (TRESIBA FLEXTOUCH) 100 UNIT/ML FlexTouch Pen     Endocrine   Hyperlipidemia associated with type 2 diabetes mellitus (HCC)   Relevant Medications   Empagliflozin-linaGLIPtin (GLYXAMBI) 25-5 MG TABS   losartan (COZAAR) 25 MG tablet   insulin degludec (TRESIBA FLEXTOUCH) 100 UNIT/ML FlexTouch Pen   Type 2 diabetes mellitus with other specified complication (HCC)  - Primary   Relevant Medications   Empagliflozin-linaGLIPtin (GLYXAMBI) 25-5 MG TABS   losartan (COZAAR) 25 MG tablet   insulin degludec (TRESIBA FLEXTOUCH) 100 UNIT/ML FlexTouch Pen   Other Relevant Orders   Bayer DCA Hb A1c Waived     Other   Encounter for long-term (current) use of insulin (HCC)    A1c 7.2, looks good, no changes.  Improved from last time.  Patient's husband is in the hospital with meningitis that she has been in and out of the hospital a lot with  him. Follow up plan: Return in about 3 months (around 05/29/2023), or if symptoms worsen or fail to improve, for Diabetes recheck.  Counseling provided for all of the vaccine components Orders Placed This Encounter  Procedures   Bayer DCA Hb A1c Waived    Arville Care, MD [facility] 02/26/2023, 11:06 AM

## 2023-03-27 ENCOUNTER — Encounter: Payer: Self-pay | Admitting: Gastroenterology

## 2023-03-27 DIAGNOSIS — M4326 Fusion of spine, lumbar region: Secondary | ICD-10-CM | POA: Diagnosis not present

## 2023-03-27 DIAGNOSIS — M7061 Trochanteric bursitis, right hip: Secondary | ICD-10-CM | POA: Diagnosis not present

## 2023-03-27 DIAGNOSIS — Z6832 Body mass index (BMI) 32.0-32.9, adult: Secondary | ICD-10-CM | POA: Diagnosis not present

## 2023-03-27 DIAGNOSIS — I10 Essential (primary) hypertension: Secondary | ICD-10-CM | POA: Diagnosis not present

## 2023-04-08 ENCOUNTER — Ambulatory Visit (INDEPENDENT_AMBULATORY_CARE_PROVIDER_SITE_OTHER): Payer: Medicare PPO | Admitting: Nurse Practitioner

## 2023-04-08 ENCOUNTER — Ambulatory Visit (INDEPENDENT_AMBULATORY_CARE_PROVIDER_SITE_OTHER): Payer: Medicare PPO

## 2023-04-08 ENCOUNTER — Encounter: Payer: Self-pay | Admitting: Nurse Practitioner

## 2023-04-08 VITALS — BP 125/84 | HR 88 | Temp 97.3°F | Resp 20 | Ht 62.0 in | Wt 163.0 lb

## 2023-04-08 DIAGNOSIS — M79671 Pain in right foot: Secondary | ICD-10-CM

## 2023-04-08 DIAGNOSIS — M722 Plantar fascial fibromatosis: Secondary | ICD-10-CM

## 2023-04-08 DIAGNOSIS — M7731 Calcaneal spur, right foot: Secondary | ICD-10-CM | POA: Diagnosis not present

## 2023-04-08 MED ORDER — PREDNISONE 20 MG PO TABS: 40.0000 mg | ORAL_TABLET | Freq: Every day | ORAL | 0 refills | Status: AC

## 2023-04-08 NOTE — Patient Instructions (Signed)
Plantar Fasciitis  Plantar fasciitis is a painful foot condition that affects the heel. It occurs when the band of tissue that connects the toes to the heel bone (plantar fascia) becomes irritated. This can happen as the result of exercising too much or doing other repetitive activities (overuse injury). Plantar fasciitis can cause mild irritation to severe pain that makes it difficult to walk or move. The pain is usually worse in the morning after sleeping, or after sitting or lying down for a period of time. Pain may also be worse after long periods of walking or standing. What are the causes? This condition may be caused by: Standing for long periods of time. Wearing shoes that do not have good arch support. Doing activities that put stress on joints (high-impact activities). This includes ballet and exercise that makes your heart beat faster (aerobic exercise), such as running. Being overweight. An abnormal way of walking (gait). Tight muscles in the back of your lower leg (calf). High arches in your feet or flat feet. Starting a new athletic activity. What are the signs or symptoms? The main symptom of this condition is heel pain. Pain may get worse after the following: Taking the first steps after a time of rest, especially in the morning after awakening, or after you have been sitting or lying down for a while. Long periods of standing still. Pain may decrease after 30-45 minutes of activity, such as gentle walking. How is this diagnosed? This condition may be diagnosed based on your medical history, a physical exam, and your symptoms. Your health care provider will check for: A tender area on the bottom of your foot. A high arch in your foot or flat feet. Pain when you move your foot. Difficulty moving your foot. You may have imaging tests to confirm the diagnosis, such as: X-rays. Ultrasound. MRI. How is this treated? Treatment for plantar fasciitis depends on how severe your  condition is. Treatment may include: Rest, ice, pressure (compression), and raising (elevating) the affected foot. This is called RICE therapy. Your health care provider may recommend RICE therapy along with over-the-counter pain medicines to manage your pain. Exercises to stretch your calves and your plantar fascia. A splint that holds your foot in a stretched, upward position while you sleep (night splint). Physical therapy to relieve symptoms and prevent problems in the future. Injections of steroid medicine (cortisone) to relieve pain and inflammation. Stimulating your plantar fascia with electrical impulses (extracorporeal shock wave therapy). This is usually the last treatment option before surgery. Surgery, if other treatments have not worked after 12 months. Follow these instructions at home: Managing pain, stiffness, and swelling  If directed, put ice on the painful area. To do this: Put ice in a plastic bag, or use a frozen bottle of water. Place a towel between your skin and the bag or bottle. Roll the bottom of your foot over the bag or bottle. Do this for 20 minutes, 2-3 times a day. Wear athletic shoes that have air-sole or gel-sole cushions, or try soft shoe inserts that are designed for plantar fasciitis. Elevate your foot above the level of your heart while you are sitting or lying down. Activity Avoid activities that cause pain. Ask your health care provider what activities are safe for you. Do physical therapy exercises and stretches as told by your health care provider. Try activities and forms of exercise that are easier on your joints (low impact). Examples include swimming, water aerobics, and biking. General instructions Take over-the-counter   and prescription medicines only as told by your health care provider. Wear a night splint while sleeping, if told by your health care provider. Loosen the splint if your toes tingle, become numb, or turn cold and blue. Maintain a  healthy weight, or work with your health care provider to lose weight as needed. Keep all follow-up visits. This is important. Contact a health care provider if you have: Symptoms that do not go away with home treatment. Pain that gets worse. Pain that affects your ability to move or do daily activities. Summary Plantar fasciitis is a painful foot condition that affects the heel. It occurs when the band of tissue that connects the toes to the heel bone (plantar fascia) becomes irritated. Heel pain is the main symptom of this condition. It may get worse after exercising too much or standing still for a long time. Treatment varies, but it usually starts with rest, ice, pressure (compression), and raising (elevating) the affected foot. This is called RICE therapy. Over-the-counter medicines can also be used to manage pain. This information is not intended to replace advice given to you by your health care provider. Make sure you discuss any questions you have with your health care provider. Document Revised: 11/15/2019 Document Reviewed: 11/15/2019 Elsevier Patient Education  2024 Elsevier Inc.  

## 2023-04-08 NOTE — Progress Notes (Signed)
Subjective:    Patient ID: Felicia Pugh, female    DOB: 04-Mar-1946, 77 y.o.   MRN: [id number]   Chief Complaint: Right heel pain and Foot Pain   Foot Pain This is a new problem. The problem occurs intermittently. The problem has been gradually worsening. Associated symptoms include arthralgias (right foot). Pertinent negatives include no abdominal pain, chest pain, diaphoresis, headaches, rash or weakness. The symptoms are aggravated by walking. She has tried acetaminophen for the symptoms. The treatment provided mild relief.    Patient Active Problem List   Diagnosis Date Noted   Encounter for long-term (current) use of insulin (HCC) 02/26/2023   Crohn's disease of both small and large intestine without complication (HCC) 06/07/2021   Loose stools 06/07/2021   Bilateral carpal tunnel syndrome 10/17/2020   Obesity (BMI 30-39.9) 05/08/2017   Solitary pulmonary nodule 05/08/2017   GERD (gastroesophageal reflux disease) 05/02/2017   Type 2 diabetes mellitus with other specified complication (HCC) 05/16/2015   Crohn's disease with complication (HCC) 05/16/2015   Diverticulosis of colon without hemorrhage    History of stroke 07/08/2014   Osteopenia 05/11/2014   Hyperlipidemia associated with type 2 diabetes mellitus (HCC) 06/01/2008   Anemia, chronic disease 06/01/2008   Hypertension associated with diabetes (HCC) 06/01/2008       Review of Systems  Constitutional:  Negative for diaphoresis.  Eyes:  Negative for pain.  Respiratory:  Negative for shortness of breath.   Cardiovascular:  Negative for chest pain, palpitations and leg swelling.  Gastrointestinal:  Negative for abdominal pain.  Endocrine: Negative for polydipsia.  Musculoskeletal:  Positive for arthralgias (right foot).  Skin:  Negative for rash.  Neurological:  Negative for dizziness, weakness and headaches.  Hematological:  Does not bruise/bleed easily.  All other systems reviewed and are negative.       Objective:   Physical Exam Constitutional:      Appearance: Normal appearance.  Cardiovascular:     Rate and Rhythm: Normal rate and regular rhythm.     Heart sounds: Normal heart sounds.  Pulmonary:     Effort: Pulmonary effort is normal.     Breath sounds: Normal breath sounds.  Musculoskeletal:     Comments: Right heel pain on palpation Slight pain on dorsal surface of foot on palpitation.  Skin:    General: Skin is warm.  Neurological:     General: No focal deficit present.     Mental Status: She is alert and oriented to person, place, and time.  Psychiatric:        Mood and Affect: Mood normal.        Behavior: Behavior normal.     BP 125/84   Pulse 88   Temp (!) 97.3 F (36.3 C) (Temporal)   Resp 20   Ht 5\' 2"  (1.575 m)   Wt 163 lb (73.9 kg)   SpO2 98%   BMI 29.81 kg/m   Right foot xray-SMALL HEEL SPUR-Preliminary reading by Paulene Floor, FNP  [facility]      Assessment & Plan:   Felicia Pugh in today with chief complaint of Right heel pain and Foot Pain   1. Right foot pain - DG Foot Complete Right  2. Plantar fasciitis of right foot Ice roll Elevate   Meds ordered this encounter  Medications   predniSONE (DELTASONE) 20 MG tablet    Sig: Take 2 tablets (40 mg total) by mouth daily with breakfast for 5 days. 2 po daily for 5 days  Dispense:  10 tablet    Refill:  0    Order Specific Question:   Supervising Provider    Answer:   Arville Care A [[id number]]        The above assessment and management plan was discussed with the patient. The patient verbalized understanding of and has agreed to the management plan. Patient is aware to call the clinic if symptoms persist or worsen. Patient is aware when to return to the clinic for a follow-up visit. Patient educated on when it is appropriate to go to the emergency department.   Mary-Margaret Daphine Deutscher, FNP

## 2023-04-27 ENCOUNTER — Other Ambulatory Visit: Payer: Self-pay

## 2023-04-27 ENCOUNTER — Emergency Department (HOSPITAL_COMMUNITY): Payer: Medicare PPO

## 2023-04-27 ENCOUNTER — Emergency Department (HOSPITAL_COMMUNITY)
Admission: EM | Admit: 2023-04-27 | Discharge: 2023-04-27 | Disposition: A | Discharge status: Home / Self Care | Payer: Medicare PPO | Attending: Emergency Medicine | Admitting: Emergency Medicine

## 2023-04-27 ENCOUNTER — Encounter (HOSPITAL_COMMUNITY): Payer: Self-pay | Admitting: *Deleted

## 2023-04-27 DIAGNOSIS — Z20822 Contact with and (suspected) exposure to covid-19: Secondary | ICD-10-CM | POA: Diagnosis not present

## 2023-04-27 DIAGNOSIS — J9811 Atelectasis: Secondary | ICD-10-CM | POA: Diagnosis not present

## 2023-04-27 DIAGNOSIS — R9389 Abnormal findings on diagnostic imaging of other specified body structures: Secondary | ICD-10-CM | POA: Diagnosis not present

## 2023-04-27 DIAGNOSIS — J069 Acute upper respiratory infection, unspecified: Secondary | ICD-10-CM | POA: Diagnosis not present

## 2023-04-27 DIAGNOSIS — Z794 Long term (current) use of insulin: Secondary | ICD-10-CM | POA: Insufficient documentation

## 2023-04-27 DIAGNOSIS — R059 Cough, unspecified: Secondary | ICD-10-CM | POA: Diagnosis not present

## 2023-04-27 LAB — SARS CORONAVIRUS 2 BY RT PCR: SARS Coronavirus 2 by RT PCR: NEGATIVE

## 2023-04-27 MED ORDER — GUAIFENESIN-DM 100-10 MG/5ML PO SYRP: 5.0000 mL | ORAL_SOLUTION | Freq: Three times a day (TID) | ORAL | 0 refills | Status: DC | PRN

## 2023-04-27 NOTE — ED Provider Notes (Signed)
[place] EMERGENCY DEPARTMENT AT [facility] Provider Note   CSN: [id number] Arrival date & time: 04/27/23  1016     History  Chief Complaint  Patient presents with   Cough    Chest soreness from coughing    Felicia Pugh is a 77 y.o. female.   Cough Patient with cough.  Mild sputum production.  Mild sore throat with the cough.  No history of COPD.  States she has been around her grandchildren have had similar symptoms.  She does not know if they were tested for COVID or other infections however.  No fevers.  She is diabetic but sugar was 130 this morning.     Home Medications Prior to Admission medications   Medication Sig Start Date End Date Taking? Authorizing Provider  guaiFENesin-dextromethorphan (ROBITUSSIN DM) 100-10 MG/5ML syrup Take 5 mLs by mouth 3 (three) times daily as needed for cough. 04/27/23  Yes Benjiman Core, MD  atorvastatin (LIPITOR) 40 MG tablet Take 1 tablet (40 mg total) by mouth daily. 10/18/22   Dettinger, Elige Radon, MD  balsalazide (COLAZAL) 750 MG capsule Take 2 capsules (1,500 mg total) by mouth in the morning and at bedtime. 07/25/22   Tiffany Kocher, PA-C  ciclopirox (PENLAC) 8 % solution APPLY EVERY DAY TO SPLIT TOENAIL ON RIGHT BIG TOE. CLEANSE WITH NAIL POLISH REMOVER ONCE A WEEK. 03/05/22   [provider]  Empagliflozin-linaGLIPtin (GLYXAMBI) 25-5 MG TABS Take 1 tablet by mouth daily. 02/26/23   Dettinger, Elige Radon, MD  glucose blood (ONETOUCH VERIO) test strip USE 1 STRIP TO CHECK GLUCOSE TWICE DAILY 02/26/23   Dettinger, Elige Radon, MD  insulin degludec (TRESIBA FLEXTOUCH) 100 UNIT/ML FlexTouch Pen INJECT 40 UNITS SUBCUTANEOUSLY ONCE DAILY 02/26/23   Dettinger, Elige Radon, MD  Insulin Pen Needle (PEN NEEDLES) 31G X 5 MM MISC Use to give insulin with flexpen 02/26/23   Dettinger, Elige Radon, MD  Insulin Syringe-Needle U-100 31G X 1/4" 1 ML MISC 1 Syringe by Does not apply route 2 (two) times daily. 02/26/23   Dettinger, Elige Radon, MD   losartan (COZAAR) 25 MG tablet Take 1 tablet (25 mg total) by mouth daily. 02/26/23   Dettinger, Elige Radon, MD  Multiple Vitamins-Minerals (WOMENS 50+ MULTI VITAMIN/MIN PO) Take by mouth.    [provider]  nystatin powder APPLY POWDER TOPICALLY TO AFFECTED AREA THREE TIMES DAILY    [provider]      Allergies    Metformin and related, Asa [aspirin], Nsaids, Other, and Tolmetin    Review of Systems   Review of Systems  Respiratory:  Positive for cough.     Physical Exam Updated Vital Signs BP 129/78   Pulse 83   Temp 97.9 F (36.6 C) (Oral)   Resp 18   Ht 5\' 2"  (1.575 m)   Wt 136.1 kg   SpO2 94%   BMI 54.87 kg/m  Physical Exam Vitals and nursing note reviewed.  Cardiovascular:     Rate and Rhythm: Regular rhythm.  Pulmonary:     Breath sounds: No wheezing or rhonchi.     Comments: Occasional cough. Abdominal:     Tenderness: There is no abdominal tenderness.  Skin:    General: Skin is warm.  Neurological:     Mental Status: She is alert.     ED Results / Procedures / Treatments   Labs (all labs ordered are listed, but only abnormal results are displayed) Labs Reviewed  SARS CORONAVIRUS 2 BY RT PCR  EKG None  Radiology DG Chest 2 View  Result Date: 04/27/2023 CLINICAL DATA:  Cough. EXAM: CHEST - 2 VIEW COMPARISON:  03/14/2022.  CT, 03/17/2017. FINDINGS: Cardiac silhouette is normal in size. No mediastinal or hilar masses. No evidence of adenopathy. Elevated left hemidiaphragm with associated left lung base atelectasis, stable from the prior study. Remainder of the lungs is clear. No pleural effusion or pneumothorax. Skeletal structures are intact. IMPRESSION: No active cardiopulmonary disease. Electronically Signed   By: Amie Portland M.D.   On: 04/27/2023 11:12    Procedures Procedures    Medications Ordered in ED Medications - No data to display  ED Course/ Medical Decision Making/ A&P                                 Medical  Decision Making Amount and/or Complexity of Data Reviewed Radiology: ordered.  Risk OTC drugs.   Patient with URI symptoms and cough.  No focal findings on lung exam.  Well-appearing.  Not hypoxic.  X-ray done and does not show pneumonia.  Negative flu and COVID testing.  Appears stable for discharge home with symptomatic treatment.  Doubt causes such as pneumonia.        Final Clinical Impression(s) / ED Diagnoses Final diagnoses:  Upper respiratory tract infection, unspecified type    Rx / DC Orders ED Discharge Orders          Ordered    guaiFENesin-dextromethorphan (ROBITUSSIN DM) 100-10 MG/5ML syrup  3 times daily PRN        04/27/23 1232              Benjiman Core, MD 04/27/23 1234

## 2023-04-27 NOTE — ED Notes (Signed)
Patient transported to X-ray 

## 2023-04-27 NOTE — ED Triage Notes (Signed)
Pt states she's had cough x2 days and chest has become sore from coughing. Denies fever.

## 2023-04-27 NOTE — ED Notes (Signed)
Pt back from x-ray.

## 2023-04-27 NOTE — ED Triage Notes (Signed)
Pt c/o cough x 2 days and chest soreness  Pt states she occasionally coughs up yellow sputum

## 2023-04-29 DIAGNOSIS — L84 Corns and callosities: Secondary | ICD-10-CM | POA: Diagnosis not present

## 2023-04-29 DIAGNOSIS — B351 Tinea unguium: Secondary | ICD-10-CM | POA: Diagnosis not present

## 2023-04-29 DIAGNOSIS — E1142 Type 2 diabetes mellitus with diabetic polyneuropathy: Secondary | ICD-10-CM | POA: Diagnosis not present

## 2023-04-29 DIAGNOSIS — M79676 Pain in unspecified toe(s): Secondary | ICD-10-CM | POA: Diagnosis not present

## 2023-04-30 ENCOUNTER — Encounter: Payer: Self-pay | Admitting: Family Medicine

## 2023-04-30 ENCOUNTER — Ambulatory Visit (INDEPENDENT_AMBULATORY_CARE_PROVIDER_SITE_OTHER): Payer: Medicare PPO | Admitting: Family Medicine

## 2023-04-30 ENCOUNTER — Ambulatory Visit (INDEPENDENT_AMBULATORY_CARE_PROVIDER_SITE_OTHER): Payer: Medicare PPO

## 2023-04-30 VITALS — BP 116/76 | HR 99 | Temp 98.3°F | Ht 62.0 in | Wt 165.0 lb

## 2023-04-30 DIAGNOSIS — R051 Acute cough: Secondary | ICD-10-CM

## 2023-04-30 DIAGNOSIS — R062 Wheezing: Secondary | ICD-10-CM | POA: Diagnosis not present

## 2023-04-30 DIAGNOSIS — R0989 Other specified symptoms and signs involving the circulatory and respiratory systems: Secondary | ICD-10-CM

## 2023-04-30 MED ORDER — AZITHROMYCIN 250 MG PO TABS: ORAL_TABLET | ORAL | 0 refills | Status: DC

## 2023-04-30 MED ORDER — AMOXICILLIN-POT CLAVULANATE 875-125 MG PO TABS: 1.0000 | ORAL_TABLET | Freq: Two times a day (BID) | ORAL | 0 refills | Status: DC

## 2023-04-30 MED ORDER — PREDNISONE 20 MG PO TABS: 40.0000 mg | ORAL_TABLET | Freq: Every day | ORAL | 0 refills | Status: AC

## 2023-04-30 MED ORDER — BENZONATATE 100 MG PO CAPS: 100.0000 mg | ORAL_CAPSULE | Freq: Three times a day (TID) | ORAL | 0 refills | Status: DC | PRN

## 2023-04-30 MED ORDER — ALBUTEROL SULFATE HFA 108 (90 BASE) MCG/ACT IN AERS: 2.0000 | INHALATION_SPRAY | Freq: Four times a day (QID) | RESPIRATORY_TRACT | 2 refills | Status: DC | PRN

## 2023-04-30 NOTE — Progress Notes (Signed)
Negative for pneumonia. Will send in tessalon perles for patient. Discussed that she does not need to start antibiotics.

## 2023-04-30 NOTE — Progress Notes (Addendum)
Subjective:  Patient ID: Felicia Pugh, female    DOB: 12-19-1945, 77 y.o.   MRN: [id number]  Patient Care Team: Dettinger, Elige Radon, MD as PCP - General (Family Medicine) Jena Gauss, Gerrit Friends, MD as Consulting Physician (Gastroenterology) Gelene Mink, NP (Gastroenterology) Ernest Mallick, MD as Consulting Physician (Orthopedic Surgery) Adam Phenix, DPM as Consulting Physician (Podiatry) Sheran Luz, MD as Consulting Physician (Physical Medicine and Rehabilitation) Delora Fuel, [place] (Optometry)   Chief Complaint:  Cough (X 1 week/ED visit over the weekend [facility]/Chest x-ray and covid test negative)  HPI: Felicia Pugh is a 77 y.o. female presenting on 04/30/2023 for Cough (X 1 week/ED visit over the weekend [facility]/Chest x-ray and covid test negative)  HPI Cough that started last Thursday. 6 days ago. Reports runny nose, mucus production. States that she can feel rattling and wheezing when she breathes. States mucus is yellow and dark yellow. Denies fever  Reports nasal congestion, ear pain, rib pain  States that today throat has started hurting, she believes due to coughing   Has gotten a humidifier and nebulizer every 8 hours  Weak, horse voice  Has been taking Robitussin, cough drops   She presented to ED 04/27/23. Received reassuring imaging at that time and was covid negative. States that she feels worse and her cough is worse than when she went to the ED.   Relevant past medical, surgical, family, and social history reviewed and updated as indicated.  Allergies and medications reviewed and updated. Data reviewed: Chart in Epic.  Past Medical History:  Diagnosis Date   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 Cleone Slim, no longer seeing anyone (02/2015)   Cataract    Complication of anesthesia    difficulty breathing after waking up from anesthesia   Crohn's disease (HCC) 2016   diagnosed in 2016 and started on Colazal, but inflammation/ulceration in ileum/cecum  dates back to 2006.   Diabetes mellitus without complication (HCC)    Diabetic neuropathy (HCC)    Hyperlipidemia    Hypertension    IBS (irritable bowel syndrome)    Stroke (HCC) 08/12/2009   Syncope and collapse    Thyroid nodule    Ulcer     Past Surgical History:  Procedure Laterality Date   ABDOMINAL HYSTERECTOMY     partial   BACK SURGERY     x3 (Dr Alben Deeds, Dr Noel Gerold)   BACK SURGERY  03/2022   BREAST SURGERY Right 2002   cyst removed   COLONOSCOPY  2006   RMR: Normal colon except for ileocecal valve, eroded/ulcerated areas at ileocecal valve with mucosal hemorrhage present status post biopsy. ulcer (ileocecal valve, biopsy): Chronic active inflammation with ulceration, nonspecific.   COLONOSCOPY  09/06/2009   RMR: Suboptimal prep on the right side. Ulcers noted at IC valve, base of cecum, and scattered through food part of ascending colon. Pathology wiht chronic active colitis, question Crohn's   COLONOSCOPY N/A 03/27/2015   Dr. Jena Gauss: abnormal cecum and IC valve most consistent with IBD. TI intubated and appeared normal. Likely Crohn's disease. Patient denies NSAID use. No improvement with Lialda samples at time of initial diagnosis   COLONOSCOPY N/A 05/15/2017   Dr. Jena Gauss: ab normal IC valve, s/p biopsy with active ileitis, diverticulosis in sigmoid and descending colon. Non-bleeding internal hemorrhoids   ESOPHAGOGASTRODUODENOSCOPY     HAND SURGERY Right    Carpel tunnel and right elbow   LAPAROSCOPIC APPENDECTOMY N/A 07/21/2016   Procedure: APPENDECTOMY  LAPAROSCOPIC;  Surgeon: Franky Macho, MD;  Location: [facility];  Service: General;  Laterality: N/A;   Small bowel capsule endoscopy  06/10/2005   RMR: single erosion versus AVM, distal ileum   Small bowel capsule endoscopy  prior to 05/2005   [place]: reported erosions/ulcerations per medical record. actual report unavailable.    Social History   Socioeconomic History   Marital status: Married    Spouse name: Molly Maduro    Number of children: 5   Years of education: 9th grade-then got her GED at [employer]   Highest education level: GED or equivalent  Occupational History   Occupation: retired  Tobacco Use   Smoking status: Never   Smokeless tobacco: Never  Vaping Use   Vaping status: Never Used  Substance and Sexual Activity   Alcohol use: No   Drug use: No   Sexual activity: Not Currently  Other Topics Concern   Not on file  Social History Narrative   Lives home with her husband - he is on dialysis - she helps care for him   Their children live nearby   She babysits her 61 year old grandchild   Social Determinants of Health   Financial Resource Strain: Low Risk  (05/31/2022)   Overall Financial Resource Strain (CARDIA)    Difficulty of Paying Living Expenses: Not hard at all  Food Insecurity: No Food Insecurity (05/31/2022)   Hunger Vital Sign    Worried About Running Out of Food in the Last Year: Never true    Ran Out of Food in the Last Year: Never true  Transportation Needs: No Transportation Needs (05/31/2022)   PRAPARE - Administrator, Civil Service (Medical): No    Lack of Transportation (Non-Medical): No  Physical Activity: Sufficiently Active (05/31/2022)   Exercise Vital Sign    Days of Exercise per Week: 7 days    Minutes of Exercise per Session: 60 min  Stress: No Stress Concern Present (05/31/2022)   [employer] of Occupational Health - Occupational Stress Questionnaire    Feeling of Stress : Not at all  Social Connections: Socially Integrated (05/31/2022)   Social Connection and Isolation Panel [NHANES]    Frequency of Communication with Friends and Family: More than three times a week    Frequency of Social Gatherings with Friends and Family: Twice a week    Attends Religious Services: More than 4 times per year    Active Member of [employer] or Organizations: Yes    Attends Engineer, structural: More than 4 times per year    Marital Status: Married   Catering manager Violence: Not At Risk (05/31/2022)   Humiliation, Afraid, Rape, and Kick questionnaire    Fear of Current or Ex-Partner: No    Emotionally Abused: No    Physically Abused: No    Sexually Abused: No   Outpatient Encounter Medications as of 04/30/2023  Medication Sig   atorvastatin (LIPITOR) 40 MG tablet Take 1 tablet (40 mg total) by mouth daily.   balsalazide (COLAZAL) 750 MG capsule Take 2 capsules (1,500 mg total) by mouth in the morning and at bedtime.   ciclopirox (PENLAC) 8 % solution APPLY EVERY DAY TO SPLIT TOENAIL ON RIGHT BIG TOE. CLEANSE WITH NAIL POLISH REMOVER ONCE A WEEK.   Empagliflozin-linaGLIPtin (GLYXAMBI) 25-5 MG TABS Take 1 tablet by mouth daily.   glucose blood (ONETOUCH VERIO) test strip USE 1 STRIP TO CHECK GLUCOSE TWICE DAILY   guaiFENesin-dextromethorphan (ROBITUSSIN DM) 100-10 MG/5ML syrup Take 5  mLs by mouth 3 (three) times daily as needed for cough.   insulin degludec (TRESIBA FLEXTOUCH) 100 UNIT/ML FlexTouch Pen INJECT 40 UNITS SUBCUTANEOUSLY ONCE DAILY   Insulin Pen Needle (PEN NEEDLES) 31G X 5 MM MISC Use to give insulin with flexpen   Insulin Syringe-Needle U-100 31G X 1/4" 1 ML MISC 1 Syringe by Does not apply route 2 (two) times daily.   losartan (COZAAR) 25 MG tablet Take 1 tablet (25 mg total) by mouth daily.   Multiple Vitamins-Minerals (WOMENS 50+ MULTI VITAMIN/MIN PO) Take by mouth.   nystatin powder APPLY POWDER TOPICALLY TO AFFECTED AREA THREE TIMES DAILY   No facility-administered encounter medications on file as of 04/30/2023.    Allergies  Allergen Reactions   Metformin And Related     Severe diarrhea   Asa [Aspirin]     GI Dr does not want her to take due to hx ulcers   Nsaids Other (See Comments)    GI Dr does not want her to take due to hx of ulcers Instructed by GI not to take    Other Diarrhea    Steroids   Tolmetin Other (See Comments)    GI Dr does not want her to take due to hx of ulcers GI Dr does not want her  to take due to hx of ulcers     Review of Systems As per HPI  Objective:  BP 116/76   Pulse 99   Temp 98.3 F (36.8 C)   Ht 5\' 2"  (1.575 m)   Wt 165 lb (74.8 kg)   SpO2 96%   BMI 30.18 kg/m    Wt Readings from Last 3 Encounters:  04/30/23 165 lb (74.8 kg)  04/27/23 300 lb (136.1 kg)  04/08/23 163 lb (73.9 kg)    Physical Exam Constitutional:      General: She is awake. She is not in acute distress.    Appearance: Normal appearance. She is well-developed and well-groomed. She is ill-appearing.  HENT:     Right Ear: Tympanic membrane is injected.     Left Ear: Tympanic membrane is injected.     Nose: Congestion and rhinorrhea present. Rhinorrhea is clear.     Right Sinus: No maxillary sinus tenderness or frontal sinus tenderness.     Left Sinus: No maxillary sinus tenderness or frontal sinus tenderness.     Mouth/Throat:     Lips: Pink.     Mouth: Mucous membranes are moist.     Tongue: No lesions.     Palate: No mass.     Pharynx: Posterior oropharyngeal erythema present. No pharyngeal swelling, oropharyngeal exudate, uvula swelling or postnasal drip.     Tonsils: No tonsillar exudate or tonsillar abscesses.  Cardiovascular:     Rate and Rhythm: Normal rate and regular rhythm.     Pulses:          Radial pulses are 2+ on the right side and 2+ on the left side.     Heart sounds: Normal heart sounds.  Pulmonary:     Effort: Pulmonary effort is normal.     Breath sounds: Transmitted upper airway sounds present. No stridor or decreased air movement. Examination of the right-upper field reveals wheezing, rhonchi and rales. Examination of the left-upper field reveals wheezing, rhonchi and rales. Examination of the right-middle field reveals wheezing, rhonchi and rales. Examination of the left-middle field reveals wheezing, rhonchi and rales. Examination of the right-lower field reveals wheezing, rhonchi and rales. Examination of the left-lower  field reveals wheezing, rhonchi  and rales. Wheezing, rhonchi and rales present. No decreased breath sounds.  Abdominal:     General: Bowel sounds are normal.     Tenderness: There is no abdominal tenderness.     Hernia: No hernia is present.  Musculoskeletal:     Right lower leg: No edema.     Left lower leg: No edema.  Lymphadenopathy:     Head:     Right side of head: No submental, submandibular, tonsillar, preauricular or posterior auricular adenopathy.     Left side of head: No submental, submandibular, tonsillar, preauricular or posterior auricular adenopathy.  Skin:    General: Skin is warm.     Capillary Refill: Capillary refill takes less than 2 seconds.  Neurological:     General: No focal deficit present.     Mental Status: She is alert, oriented to person, place, and time and easily aroused.  Psychiatric:        Attention and Perception: Attention and perception normal.        Mood and Affect: Mood and affect normal.        Speech: Speech normal.        Behavior: Behavior normal. Behavior is cooperative.        Thought Content: Thought content normal.        Cognition and Memory: Cognition and memory normal.        Judgment: Judgment normal.    Results for orders placed or performed during the hospital encounter of 04/27/23  SARS Coronavirus 2 by RT PCR (hospital order, performed in [facility] hospital lab) *cepheid single result test* Anterior Nasal Swab   Specimen: Anterior Nasal Swab  Result Value Ref Range   SARS Coronavirus 2 by RT PCR NEGATIVE NEGATIVE      04/30/2023    3:26 PM 04/08/2023    2:54 PM 02/26/2023   10:32 AM 12/03/2022   10:47 AM 10/18/2022    9:40 AM  Depression screen PHQ 2/9  Decreased Interest 0 0 0 0   Down, Depressed, Hopeless 0 0 0 0   PHQ - 2 Score 0 0 0 0   Altered sleeping 0 0 0  0  Tired, decreased energy 1 0 0  0  Change in appetite 0 0 0  0  Feeling bad or failure about yourself  0 0 0  0  Trouble concentrating 0 0 0  0  Moving slowly or fidgety/restless 0 0 0   0  Suicidal thoughts 0 0 0  0  PHQ-9 Score 1 0 0    Difficult doing work/chores Not difficult at all Not difficult at all Not difficult at all  Not difficult at all       04/30/2023    3:27 PM 04/08/2023    2:54 PM 02/26/2023   10:32 AM 10/18/2022    9:39 AM  GAD 7 : Generalized Anxiety Score  Nervous, Anxious, on Edge 0 0 0 0  Control/stop worrying 0 0 0 0  Worry too much - different things 0 0 0 0  Trouble relaxing 0 0 0 0  Restless 0 0 0 0  Easily annoyed or irritable 0 0 0 0  Afraid - awful might happen 0 0 0 0  Total GAD 7 Score 0 0 0 0  Anxiety Difficulty Not difficult at all Not difficult at all Not difficult at all Not difficult at all   Pertinent labs & imaging results that were available during my  care of the patient were reviewed by me and considered in my medical decision making.  Assessment & Plan:  Ryley was seen today for cough.  Diagnoses and all orders for this visit:  [facility] as below. Initially sent in antibiotics to cover for pneumonia. Due to no abnormalities instructed patient that she does not need to start antibiotics. Will send in tessalon perles for her, prednisone and albuterol for symptoms. Patient to continue OTC supportive treatments.  -     DG Chest 2 View; Future -     predniSONE (DELTASONE) 20 MG tablet; Take 2 tablets (40 mg total) by mouth daily with breakfast for 5 days. -     albuterol (VENTOLIN HFA) 108 (90 Base) MCG/ACT inhaler; Inhale 2 puffs into the lungs every 6 (six) hours as needed for wheezing or shortness of breath. -     Discontinue: amoxicillin-clavulanate (AUGMENTIN) 875-125 MG tablet; Take 1 tablet by mouth 2 (two) times daily for 7 days. -     Discontinue: azithromycin (ZITHROMAX) 250 MG tablet; Take 2 tablets on day 1, then 1 tablet daily on days 2 through 5  Acute cough As above  -     Discontinue: amoxicillin-clavulanate (AUGMENTIN) 875-125 MG tablet; Take 1 tablet by mouth 2 (two) times daily for 7 days. -      Discontinue: azithromycin (ZITHROMAX) 250 MG tablet; Take 2 tablets on day 1, then 1 tablet daily on days 2 through 5 -     benzonatate (TESSALON PERLES) 100 MG capsule; Take 1 capsule (100 mg total) by mouth 3 (three) times daily as needed for cough.  Chest congestion As above.  -     Discontinue: amoxicillin-clavulanate (AUGMENTIN) 875-125 MG tablet; Take 1 tablet by mouth 2 (two) times daily for 7 days. -     Discontinue: azithromycin (ZITHROMAX) 250 MG tablet; Take 2 tablets on day 1, then 1 tablet daily on days 2 through 5   Continue all other maintenance medications.  Follow up plan: Return if symptoms worsen or fail to improve.  Continue healthy lifestyle choices, including diet (rich in fruits, vegetables, and lean proteins, and low in salt and simple carbohydrates) and exercise (at least 30 minutes of moderate physical activity daily).  Written and verbal instructions provided   The above assessment and management plan was discussed with the patient. The patient verbalized understanding of and has agreed to the management plan. Patient is aware to call the clinic if they develop any new symptoms or if symptoms persist or worsen. Patient is aware when to return to the clinic for a follow-up visit. Patient educated on when it is appropriate to go to the emergency department.   Neale Burly, DNP-FNP [facility] [address] [place], [place] [address] [phone]

## 2023-04-30 NOTE — Addendum Note (Signed)
Addended by: Neale Burly on: 04/30/2023 04:57 PM   Modules accepted: Orders

## 2023-05-07 ENCOUNTER — Encounter: Payer: Self-pay | Admitting: Gastroenterology

## 2023-05-07 ENCOUNTER — Ambulatory Visit (INDEPENDENT_AMBULATORY_CARE_PROVIDER_SITE_OTHER): Payer: Medicare PPO | Admitting: Gastroenterology

## 2023-05-07 VITALS — BP 148/82 | HR 84 | Temp 98.6°F | Ht 62.0 in | Wt 168.8 lb

## 2023-05-07 DIAGNOSIS — K5080 Crohn's disease of both small and large intestine without complications: Secondary | ICD-10-CM | POA: Diagnosis not present

## 2023-05-07 NOTE — Progress Notes (Addendum)
GI Office Note    Referring Provider: Dettinger, Elige Radon, MD Primary Care Physician:  Dettinger, Elige Radon, MD  Primary Gastroenterologist: Roetta Sessions, MD   Chief Complaint   Chief Complaint  Patient presents with   Follow-up    Doing well GI related    History of Present Illness   Felicia Pugh is a 77 y.o. female presenting today for follow up. 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   Doing okay from a GI standpoint.  She has been through a lot since we last saw her.  Her husband died from spinal meningitis/sepsis in July.  Patient has been dealing with upper respiratory infection, just completing Augmentin last night.  Has also been on Zithromax, prednisone as well.  Continues to have a cough.  2 recent x-rays no signs of active lung disease.  Recent COVID test negative.  Started having diarrhea 3 days ago while on antibiotics.  Last night was up 5 times with watery stool.  2 episodes this morning.  No blood per rectum or melena.  Prior to taking antibiotics, bowel movements regular, normal. No abdominal pain. No heartburn, vomiting.  CT A/P with contrast 10/2022: IMPRESSION: 1. No acute findings within the abdomen or pelvis. 2. Status post appendectomy and hysterectomy. 3. 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 4. No findings to suggest active Crohn's disease at this time. No evidence for bowel obstruction or inflammation. 5. Status post posterior hardware fixation of L2 through L5. 6.  Aortic Atherosclerosis (ICD10-I70.0).   Colonoscopy October 2018: - abnormal ileocecal valve?biopsied, focal active ileitis on biopsy - Diverticulosis in the sigmoid colon and in  the descending colon. The examination was otherwise normal on direct and retroflexion views. - Non-bleeding internal hemorrhoids. -5 year surveillance colonoscopy recommended    Medications   Current Outpatient Medications  Medication Sig Dispense Refill   albuterol (VENTOLIN HFA) 108 (90 Base) MCG/ACT inhaler Inhale 2 puffs into the lungs every 6 (six) hours as needed for wheezing or shortness of breath. 8 g 2   atorvastatin (LIPITOR) 40 MG tablet Take 1 tablet (40 mg total) by mouth daily. 90 tablet 3   balsalazide (COLAZAL) 750 MG capsule Take 2 capsules (1,500 mg total) by mouth in the morning and at bedtime. 360 capsule 3   ciclopirox (PENLAC) 8 % solution APPLY EVERY DAY TO SPLIT TOENAIL ON RIGHT BIG TOE. CLEANSE WITH NAIL POLISH REMOVER ONCE A WEEK.     Empagliflozin-linaGLIPtin (GLYXAMBI) 25-5 MG TABS Take 1 tablet by mouth daily. 90 tablet 3   glucose blood (ONETOUCH VERIO) test strip USE 1 STRIP TO CHECK GLUCOSE TWICE DAILY 200 each 2   guaiFENesin-dextromethorphan (ROBITUSSIN DM) 100-10 MG/5ML syrup Take 5 mLs by mouth 3 (three) times daily as needed for cough. 118 mL 0   insulin degludec (TRESIBA FLEXTOUCH) 100 UNIT/ML FlexTouch Pen INJECT 40 UNITS SUBCUTANEOUSLY ONCE DAILY 30 mL 3   Insulin Pen Needle (PEN NEEDLES) 31G X 5 MM MISC Use to give insulin with flexpen 100 each 1   Insulin Syringe-Needle U-100 31G X 1/4" 1 ML MISC 1 Syringe by Does not apply route 2 (two) times daily. 60 each 6   losartan (COZAAR) 25 MG tablet Take 1 tablet (25 mg total)  by mouth daily. 90 tablet 3   Multiple Vitamins-Minerals (WOMENS 50+ MULTI VITAMIN/MIN PO) Take by mouth.     nystatin powder APPLY POWDER TOPICALLY TO AFFECTED AREA THREE TIMES DAILY     No current facility-administered medications for this visit.    Allergies   Allergies as of 05/07/2023 - Review Complete 05/07/2023  Allergen Reaction Noted   Metformin and related  05/11/2014   Asa [aspirin]  11/04/2012   Nsaids Other (See  Comments) 11/04/2012   Other Diarrhea 12/25/2014   Tolmetin Other (See Comments) 11/04/2012    Review of Systems   General: Negative for anorexia, weight loss, fever, chills, fatigue, weakness. ENT: Negative for hoarseness, difficulty swallowing , nasal congestion. CV: Negative for chest pain, angina, palpitations, dyspnea on exertion, peripheral edema.  Respiratory: Negative for dyspnea at rest, dyspnea on exertion, +cough, sputum, wheezing.  GI: See history of present illness. GU:  Negative for dysuria, hematuria, urinary incontinence, urinary frequency, nocturnal urination.  Endo: Negative for unusual weight change.     Physical Exam   BP (!) 148/82 (BP Location: Right Arm, Patient Position: Sitting, Cuff Size: Normal)   Pulse 84   Temp 98.6 F (37 C) (Oral)   Ht 5\' 2"  (1.575 m)   Wt 168 lb 12.8 oz (76.6 kg)   SpO2 100%   BMI 30.87 kg/m    General: Well-nourished, well-developed in no acute distress.  Eyes: No icterus. Mouth: Oropharyngeal mucosa moist and pink  Lungs: Clear to auscultation bilaterally.  Heart: Regular rate and rhythm, no murmurs rubs or gallops.  Abdomen: Bowel sounds are normal, nontender, nondistended, no hepatosplenomegaly or masses,  no abdominal bruits or hernia , no rebound or guarding.  Rectal: not performed  Extremities: No lower extremity edema. No clubbing or deformities. Neuro: Alert and oriented x 4   Skin: Warm and dry, no jaundice.   Psych: Alert and cooperative, normal mood and affect.  Labs   Lab Results  Component Value Date   HGBA1C 7.2 (H) 02/26/2023   Lab Results  Component Value Date   NA 147 (H) 10/18/2022   CL 107 (H) 10/18/2022   K 3.7 10/18/2022   CO2 25 10/18/2022   BUN 9 10/18/2022   CREATININE 0.67 10/18/2022   EGFR 91 10/18/2022   CALCIUM 8.9 10/18/2022   ALBUMIN 4.0 10/12/2022   GLUCOSE 72 10/18/2022   Lab Results  Component Value Date   ALT 16 10/12/2022   AST 25 10/12/2022   ALKPHOS 112 10/12/2022    BILITOT 0.6 10/12/2022   Lab Results  Component Value Date   WBC 6.5 10/12/2022   HGB 12.9 10/12/2022   HCT 42.6 10/12/2022   MCV 91.0 10/12/2022   PLT 238 10/12/2022    Imaging Studies   DG Chest 2 View  Result Date: 04/30/2023 CLINICAL DATA:  Wheezing EXAM: CHEST - 2 VIEW COMPARISON:  None 1524 FINDINGS: Chronic elevation of the left diaphragmatic leaflet.  Lungs clear. Heart size and mediastinal contours are within normal limits. No effusion. Lumbar fixation hardware partially visualized. IMPRESSION: No acute cardiopulmonary disease. Electronically Signed   By: Corlis Leak M.D.   On: 04/30/2023 16:18   DG Chest 2 View  Result Date: 04/27/2023 CLINICAL DATA:  Cough. EXAM: CHEST - 2 VIEW COMPARISON:  03/14/2022.  CT, 03/17/2017. FINDINGS: Cardiac silhouette is normal in size. No mediastinal or hilar masses. No evidence of adenopathy. Elevated left hemidiaphragm with associated left lung base atelectasis, stable from the prior study. Remainder of the lungs  is clear. No pleural effusion or pneumothorax. Skeletal structures are intact. IMPRESSION: No active cardiopulmonary disease. Electronically Signed   By: Amie Portland M.D.   On: 04/27/2023 11:12   DG Foot Complete Right  Result Date: 04/16/2023 CLINICAL DATA:  Pain. EXAM: RIGHT FOOT COMPLETE - 3 VIEW COMPARISON:  None Available. FINDINGS: There is no evidence of fracture or dislocation. There is no evidence of arthropathy or other focal bone abnormality. Soft tissues are unremarkable. There are small posterior and plantar calcaneal spurs. IMPRESSION: Plantar and posterior small calcaneal spurs. No acute osseous abnormalities. Electronically Signed   By: Layla Maw M.D.   On: 04/16/2023 18:43    Assessment   *Ileocolonic Crohn's, mostly involving ICV *Diarrhea  Recent diarrhea in setting of antibiotics and prednisone use for URI. May simply be antibiotic associated diarrhea and get better over the next few days. She will monitor  for persistent symptoms, would consider ruling out Cdiff at that time.  She has historically had well controlled Crohn's, will normal BMs up until recently.   She is not ready to pursue surveillance colonoscopy at this time given numerous issues as outlined above. Given her age, we could consider no future surveillance colonoscopies but will discuss with her primary GI, Dr. Jena Gauss.      PLAN   Continue balsalazide 2 capsules BID. Monitor for persisting diarrhea, if does not resolve over the next few days, she will complete Cdiff testing, orders provided.  To determine if future surveillance colonoscopy necessary, given age. To discuss with Dr. Jena Gauss. Return ov in six months.  Leanna Battles. Melvyn Neth, MHS, PA-C [facility]  Addendum: Discussed with Dr. Jena Gauss. No need for surveillance colonoscopy due to age. Colonoscopy only if new symptoms or specifically for Crohn's issues/management.

## 2023-05-07 NOTE — Patient Instructions (Signed)
Continue balsalazide 2 capsules in the morning and 2 capsules in the evening. Monitor for persisting diarrhea, if you continue to have diarrhea over the next several days, please collect stool sample to rule out C.diff ([facility]).  I will ask Dr. Jena Gauss if he still wants you to have future  colonoscopy (given age). We will let you know! Return office visit in six months.

## 2023-05-12 DIAGNOSIS — K5080 Crohn's disease of both small and large intestine without complications: Secondary | ICD-10-CM | POA: Diagnosis not present

## 2023-05-13 LAB — C. DIFFICILE GDH AND TOXIN A/B
GDH ANTIGEN: NOT DETECTED
MICRO NUMBER:: 15531623
SPECIMEN QUALITY:: ADEQUATE
TOXIN A AND B: NOT DETECTED

## 2023-05-21 ENCOUNTER — Other Ambulatory Visit: Payer: Self-pay

## 2023-05-21 ENCOUNTER — Telehealth: Payer: Self-pay

## 2023-05-21 DIAGNOSIS — K5080 Crohn's disease of both small and large intestine without complications: Secondary | ICD-10-CM

## 2023-05-21 DIAGNOSIS — E1169 Type 2 diabetes mellitus with other specified complication: Secondary | ICD-10-CM | POA: Diagnosis not present

## 2023-05-21 NOTE — Telephone Encounter (Signed)
Pt called stating that that 1/2 tablet of imodium twice daily did not work and that she is still having severe diarrhea. Pt is wanting to know what else she can do.

## 2023-05-21 NOTE — Telephone Encounter (Signed)
Discussed with dr Jena Gauss. We need to determine if she is having a crohn's flare.   She needs  Sed rate CRP TSH CBC BMET Fecal calprotectin  Try to get as soon as possible.  Also, can you find out how many stools daily she is having?

## 2023-05-21 NOTE — Telephone Encounter (Signed)
Pt was made aware and verbalized understanding. Labs and stool study was ordered, pt states that she has anywhere from 6 to 10 stools a day and that some days are worse than others.

## 2023-05-22 ENCOUNTER — Other Ambulatory Visit: Payer: Self-pay | Admitting: Gastroenterology

## 2023-05-22 DIAGNOSIS — K5080 Crohn's disease of both small and large intestine without complications: Secondary | ICD-10-CM | POA: Diagnosis not present

## 2023-05-22 LAB — BASIC METABOLIC PANEL
BUN: 19 mg/dL (ref 7–25)
CO2: 27 mmol/L (ref 20–32)
Calcium: 9.2 mg/dL (ref 8.6–10.4)
Chloride: 107 mmol/L (ref 98–110)
Creat: 0.69 mg/dL (ref 0.60–1.00)
Glucose, Bld: 172 mg/dL — ABNORMAL HIGH (ref 65–99)
Potassium: 4.2 mmol/L (ref 3.5–5.3)
Sodium: 144 mmol/L (ref 135–146)

## 2023-05-22 LAB — TSH: TSH: 1.14 m[IU]/L (ref 0.40–4.50)

## 2023-05-22 LAB — CBC WITH DIFFERENTIAL/PLATELET
Absolute Monocytes: 660 {cells}/uL (ref 200–950)
Basophils Absolute: 50 {cells}/uL (ref 0–200)
Basophils Relative: 0.5 %
Eosinophils Absolute: 150 {cells}/uL (ref 15–500)
Eosinophils Relative: 1.5 %
HCT: 38.3 % (ref 35.0–45.0)
Hemoglobin: 11.9 g/dL (ref 11.7–15.5)
Lymphs Abs: 1710 {cells}/uL (ref 850–3900)
MCH: 28.1 pg (ref 27.0–33.0)
MCHC: 31.1 g/dL — ABNORMAL LOW (ref 32.0–36.0)
MCV: 90.3 fL (ref 80.0–100.0)
MPV: 10.4 fL (ref 7.5–12.5)
Monocytes Relative: 6.6 %
Neutro Abs: 7430 {cells}/uL (ref 1500–7800)
Neutrophils Relative %: 74.3 %
Platelets: 244 10*3/uL (ref 140–400)
RBC: 4.24 10*6/uL (ref 3.80–5.10)
RDW: 13 % (ref 11.0–15.0)
Total Lymphocyte: 17.1 %
WBC: 10 10*3/uL (ref 3.8–10.8)

## 2023-05-22 LAB — SEDIMENTATION RATE: Sed Rate: 11 mm/h (ref 0–30)

## 2023-05-22 LAB — C-REACTIVE PROTEIN: CRP: 3.9 mg/L (ref <8.0)

## 2023-05-22 MED ORDER — BUDESONIDE 3 MG PO CPEP: 9.0000 mg | ORAL_CAPSULE | Freq: Every day | ORAL | 1 refills | Status: DC

## 2023-05-27 ENCOUNTER — Other Ambulatory Visit: Payer: Self-pay | Admitting: *Deleted

## 2023-05-27 MED ORDER — ONETOUCH VERIO VI STRP: ORAL_STRIP | 3 refills | Status: DC

## 2023-05-27 NOTE — Telephone Encounter (Signed)
Fax from [facility] Test strips needing Dx, sent

## 2023-05-29 ENCOUNTER — Ambulatory Visit (INDEPENDENT_AMBULATORY_CARE_PROVIDER_SITE_OTHER): Payer: Medicare PPO | Admitting: Family Medicine

## 2023-05-29 ENCOUNTER — Encounter: Payer: Self-pay | Admitting: Family Medicine

## 2023-05-29 VITALS — BP 162/80 | HR 82 | Ht 62.0 in | Wt 167.0 lb

## 2023-05-29 DIAGNOSIS — I152 Hypertension secondary to endocrine disorders: Secondary | ICD-10-CM

## 2023-05-29 DIAGNOSIS — E785 Hyperlipidemia, unspecified: Secondary | ICD-10-CM

## 2023-05-29 DIAGNOSIS — E1159 Type 2 diabetes mellitus with other circulatory complications: Secondary | ICD-10-CM

## 2023-05-29 DIAGNOSIS — Z23 Encounter for immunization: Secondary | ICD-10-CM | POA: Diagnosis not present

## 2023-05-29 DIAGNOSIS — E1169 Type 2 diabetes mellitus with other specified complication: Secondary | ICD-10-CM | POA: Diagnosis not present

## 2023-05-29 DIAGNOSIS — Z794 Long term (current) use of insulin: Secondary | ICD-10-CM

## 2023-05-29 LAB — CALPROTECTIN: Calprotectin: 62 ug/g

## 2023-05-29 LAB — BAYER DCA HB A1C WAIVED: HB A1C (BAYER DCA - WAIVED): 7.3 % — ABNORMAL HIGH (ref 4.8–5.6)

## 2023-05-29 NOTE — Progress Notes (Signed)
BP (!) 162/80   Pulse 82   Ht 5\' 2"  (1.575 m)   Wt 167 lb (75.8 kg)   SpO2 97%   BMI 30.54 kg/m    Subjective:   Patient ID: Felicia Pugh, female    DOB: 17-Sep-1945, 77 y.o.   MRN: [id number]  HPI: Felicia Pugh is a 77 y.o. female presenting on 05/29/2023 for Medical Management of Chronic Issues and Diabetes   HPI Type 2 diabetes mellitus Patient comes in today for recheck of his diabetes. Patient has been currently taking Glyxambi and [place]. Patient is currently on an ACE inhibitor/ARB. Patient has not seen an ophthalmologist this year. Patient denies any new issues with their feet. The symptom started onset as an adult hypertension and hyperlipidemia ARE RELATED TO DM   Hypertension Patient is currently on losartan, and their blood pressure today is 162/80 but she states is running better at home.  She has not taken her medicines this morning either.. Patient denies any lightheadedness or dizziness. Patient denies headaches, blurred vision, chest pains, shortness of breath, or weakness. Denies any side effects from medication and is content with current medication.   Hyperlipidemia Patient is coming in for recheck of his hyperlipidemia. The patient is currently taking atorvastatin. They deny any issues with myalgias or history of liver damage from it. They deny any focal numbness or weakness or chest pain.   Relevant past medical, surgical, family and social history reviewed and updated as indicated. Interim medical history since our last visit reviewed. Allergies and medications reviewed and updated.  Review of Systems  Constitutional:  Negative for chills and fever.  Eyes:  Negative for visual disturbance.  Respiratory:  Negative for chest tightness and shortness of breath.   Cardiovascular:  Negative for chest pain and leg swelling.  Genitourinary:  Negative for difficulty urinating and dysuria.  Musculoskeletal:  Negative for back pain and gait problem.  Skin:   Negative for rash.  Neurological:  Negative for dizziness, light-headedness and headaches.  Psychiatric/Behavioral:  Negative for agitation and behavioral problems.   All other systems reviewed and are negative.   Per HPI unless specifically indicated above   Allergies as of 05/29/2023       Reactions   Metformin And Related    Severe diarrhea   Asa [aspirin]    GI Dr does not want her to take due to hx ulcers   Nsaids Other (See Comments)   GI Dr does not want her to take due to hx of ulcers Instructed by GI not to take    Other Diarrhea   Steroids   Tolmetin Other (See Comments)   GI Dr does not want her to take due to hx of ulcers GI Dr does not want her to take due to hx of ulcers        Medication List        Accurate as of May 29, 2023 10:12 AM. If you have any questions, ask your nurse or doctor.          STOP taking these medications    guaiFENesin-dextromethorphan 100-10 MG/5ML syrup Commonly known as: ROBITUSSIN DM Stopped by: Elige Radon Moraima Burd       TAKE these medications    albuterol 108 (90 Base) MCG/ACT inhaler Commonly known as: VENTOLIN HFA Inhale 2 puffs into the lungs every 6 (six) hours as needed for wheezing or shortness of breath.   atorvastatin 40 MG tablet Commonly known as: LIPITOR Take 1 tablet (  40 mg total) by mouth daily.   balsalazide 750 MG capsule Commonly known as: COLAZAL Take 2 capsules (1,500 mg total) by mouth in the morning and at bedtime.   budesonide 3 MG 24 hr capsule Commonly known as: ENTOCORT EC Take 3 capsules (9 mg total) by mouth daily.   ciclopirox 8 % solution Commonly known as: PENLAC APPLY EVERY DAY TO SPLIT TOENAIL ON RIGHT BIG TOE. CLEANSE WITH NAIL POLISH REMOVER ONCE A WEEK.   Glyxambi 25-5 MG Tabs Generic drug: Empagliflozin-linaGLIPtin Take 1 tablet by mouth daily.   Insulin Syringe-Needle U-100 31G X 1/4" 1 ML Misc 1 Syringe by Does not apply route 2 (two) times daily.   losartan  25 MG tablet Commonly known as: COZAAR Take 1 tablet (25 mg total) by mouth daily.   nystatin powder APPLY POWDER TOPICALLY TO AFFECTED AREA THREE TIMES DAILY   OneTouch Verio test strip Generic drug: glucose blood CHECK GLUCOSE TWICE DAILY Dx E11.69   Pen Needles 31G X 5 MM Misc Use to give insulin with flexpen   Tresiba FlexTouch 100 UNIT/ML FlexTouch Pen Generic drug: insulin degludec INJECT 40 UNITS SUBCUTANEOUSLY ONCE DAILY   WOMENS 50+ MULTI VITAMIN/MIN PO Take by mouth.         Objective:   BP (!) 162/80   Pulse 82   Ht 5\' 2"  (1.575 m)   Wt 167 lb (75.8 kg)   SpO2 97%   BMI 30.54 kg/m   Wt Readings from Last 3 Encounters:  05/29/23 167 lb (75.8 kg)  05/07/23 168 lb 12.8 oz (76.6 kg)  04/30/23 165 lb (74.8 kg)    Physical Exam Vitals and nursing note reviewed.  Constitutional:      General: She is not in acute distress.    Appearance: She is well-developed. She is not diaphoretic.  Eyes:     Conjunctiva/sclera: Conjunctivae normal.  Cardiovascular:     Rate and Rhythm: Normal rate and regular rhythm.     Heart sounds: Normal heart sounds. No murmur heard. Pulmonary:     Effort: Pulmonary effort is normal. No respiratory distress.     Breath sounds: Normal breath sounds. No wheezing.  Musculoskeletal:        General: No swelling or tenderness. Normal range of motion.  Skin:    General: Skin is warm and dry.     Findings: No rash.  Neurological:     Mental Status: She is alert and oriented to person, place, and time.     Coordination: Coordination normal.  Psychiatric:        Behavior: Behavior normal.       Assessment & Plan:   Problem List Items Addressed This Visit       Cardiovascular and Mediastinum   Hypertension associated with diabetes (HCC)     Endocrine   Hyperlipidemia associated with type 2 diabetes mellitus (HCC)   Type 2 diabetes mellitus with other specified complication (HCC) - Primary   Relevant Orders   Bayer DCA Hb  A1c Waived    A1c is 7.3 which is up slightly but she did have some steroids a couple ago so is likely because of that, mainly focus on diet.  She did also start some budesonide and had some recent steroids.  Monitor blood pressure at home and bring blood pressure machine with her next time she comes. Follow up plan: Return in about 3 months (around 08/29/2023), or if symptoms worsen or fail to improve, for Diabetes and hypertension.  Counseling  provided for all of the vaccine components Orders Placed This Encounter  Procedures   Bayer DCA Hb A1c Waived    Arville Care, MD [facility] 05/29/2023, 10:12 AM

## 2023-05-30 DIAGNOSIS — H9313 Tinnitus, bilateral: Secondary | ICD-10-CM | POA: Diagnosis not present

## 2023-05-30 DIAGNOSIS — H903 Sensorineural hearing loss, bilateral: Secondary | ICD-10-CM | POA: Diagnosis not present

## 2023-06-03 ENCOUNTER — Ambulatory Visit: Payer: Medicare PPO

## 2023-06-03 VITALS — Ht 62.0 in | Wt 168.0 lb

## 2023-06-03 DIAGNOSIS — Z0000 Encounter for general adult medical examination without abnormal findings: Secondary | ICD-10-CM

## 2023-06-03 DIAGNOSIS — Z780 Asymptomatic menopausal state: Secondary | ICD-10-CM | POA: Diagnosis not present

## 2023-06-03 NOTE — Patient Instructions (Signed)
Felicia Pugh , Thank you for taking time to come for your Medicare Wellness Visit. I appreciate your ongoing commitment to your health goals. Please review the following plan we discussed and let me know if I can assist you in the future.   Referrals/Orders/Follow-Ups/Clinician Recommendations: Aim for 30 minutes of exercise or brisk walking, 6-8 glasses of water, and 5 servings of fruits and vegetables each day.   This is a list of the screening recommended for you and due dates:  Health Maintenance  Topic Date Due   COVID-19 Vaccine (5 - 2023-24 season) 04/13/2023   Yearly kidney health urinalysis for diabetes  07/04/2023   DEXA scan (bone density measurement)  06/15/2023   Eye exam for diabetics  11/21/2023   Hemoglobin A1C  11/27/2023   Complete foot exam   02/26/2024   Yearly kidney function blood test for diabetes  05/20/2024   Medicare Annual Wellness Visit  06/02/2024   DTaP/Tdap/Td vaccine (2 - Td or Tdap) 12/31/2027   Pneumonia Vaccine  Completed   Flu Shot  Completed   Hepatitis C Screening  Completed   Zoster (Shingles) Vaccine  Completed   HPV Vaccine  Aged Out   Colon Cancer Screening  Discontinued    Advanced directives: (Copy Requested) Please bring a copy of your health care power of attorney and living will to the office to be added to your chart at your convenience.  Next Medicare Annual Wellness Visit scheduled for next year: Yes  Insert Preventive Care attachment Insert FALL PREVENTION attachment if needed

## 2023-06-03 NOTE — Progress Notes (Signed)
Subjective:   Felicia Pugh is a 77 y.o. female who presents for Medicare Annual (Subsequent) preventive examination.  Visit Complete: Virtual I connected with  Trevor Iha on 06/03/23 by a audio enabled telemedicine application and verified that I am speaking with the correct person using two identifiers.  Patient Location: Home  Provider Location: Home Office  I discussed the limitations of evaluation and management by telemedicine. The patient expressed understanding and agreed to proceed.  Vital Signs: Because this visit was a virtual/telehealth visit, some criteria may be missing or patient reported. Any vitals not documented were not able to be obtained and vitals that have been documented are patient reported.  Patient Medicare AWV questionnaire was completed by the patient on 06/03/2023; I have confirmed that all information answered by patient is correct and no changes since this date.  Cardiac Risk Factors include: advanced age (>61men, >22 women);diabetes mellitus;dyslipidemia;hypertension     Objective:    Today's Vitals   06/03/23 1106  Weight: 168 lb (76.2 kg)  Height: 5\' 2"  (1.575 m)   Body mass index is 30.73 kg/m.     06/03/2023   11:11 AM 04/27/2023   10:35 AM 10/12/2022    8:46 AM 05/31/2022   11:03 AM 03/14/2022    6:06 PM 12/12/2021    1:00 PM 05/30/2021   10:32 AM  Advanced Directives  Does Patient Have a Medical Advance Directive? Yes No Yes No No Yes No  Type of Estate agent of [place];Living will  Healthcare Power of Attorney      Copy of Healthcare Power of Attorney in Chart? No - copy requested        Would patient like information on creating a medical advance directive?  No - Patient declined  No - Patient declined   No - Patient declined    Current Medications (verified) Outpatient Encounter Medications as of 06/03/2023  Medication Sig   albuterol (VENTOLIN HFA) 108 (90 Base) MCG/ACT inhaler Inhale 2 puffs into the  lungs every 6 (six) hours as needed for wheezing or shortness of breath.   atorvastatin (LIPITOR) 40 MG tablet Take 1 tablet (40 mg total) by mouth daily.   balsalazide (COLAZAL) 750 MG capsule Take 2 capsules (1,500 mg total) by mouth in the morning and at bedtime.   budesonide (ENTOCORT EC) 3 MG 24 hr capsule Take 3 capsules (9 mg total) by mouth daily.   ciclopirox (PENLAC) 8 % solution APPLY EVERY DAY TO SPLIT TOENAIL ON RIGHT BIG TOE. CLEANSE WITH NAIL POLISH REMOVER ONCE A WEEK.   Empagliflozin-linaGLIPtin (GLYXAMBI) 25-5 MG TABS Take 1 tablet by mouth daily.   glucose blood (ONETOUCH VERIO) test strip CHECK GLUCOSE TWICE DAILY Dx E11.69   insulin degludec (TRESIBA FLEXTOUCH) 100 UNIT/ML FlexTouch Pen INJECT 40 UNITS SUBCUTANEOUSLY ONCE DAILY   Insulin Pen Needle (PEN NEEDLES) 31G X 5 MM MISC Use to give insulin with flexpen   Insulin Syringe-Needle U-100 31G X 1/4" 1 ML MISC 1 Syringe by Does not apply route 2 (two) times daily.   losartan (COZAAR) 25 MG tablet Take 1 tablet (25 mg total) by mouth daily.   Multiple Vitamins-Minerals (WOMENS 50+ MULTI VITAMIN/MIN PO) Take by mouth.   nystatin powder APPLY POWDER TOPICALLY TO AFFECTED AREA THREE TIMES DAILY   No facility-administered encounter medications on file as of 06/03/2023.    Allergies (verified) Metformin and related, Asa [aspirin], Nsaids, Other, and Tolmetin   History: Past Medical History:  Diagnosis Date  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 Cleone Slim, no longer seeing anyone (02/2015)   Cataract    Complication of anesthesia    difficulty breathing after waking up from anesthesia   Crohn's disease (HCC) 2016   diagnosed in 2016 and started on Colazal, but inflammation/ulceration in ileum/cecum dates back to 2006.   Diabetes mellitus without complication (HCC)    Diabetic neuropathy (HCC)    Hyperlipidemia    Hypertension    IBS (irritable bowel syndrome)    Stroke (HCC) 08/12/2009   Syncope and  collapse    Thyroid nodule    Ulcer    Past Surgical History:  Procedure Laterality Date   ABDOMINAL HYSTERECTOMY     partial   BACK SURGERY     x3 (Dr Alben Deeds, Dr Noel Gerold)   BACK SURGERY  03/2022   BREAST SURGERY Right 2002   cyst removed   COLONOSCOPY  2006   RMR: Normal colon except for ileocecal valve, eroded/ulcerated areas at ileocecal valve with mucosal hemorrhage present status post biopsy. ulcer (ileocecal valve, biopsy): Chronic active inflammation with ulceration, nonspecific.   COLONOSCOPY  09/06/2009   RMR: Suboptimal prep on the right side. Ulcers noted at IC valve, base of cecum, and scattered through food part of ascending colon. Pathology wiht chronic active colitis, question Crohn's   COLONOSCOPY N/A 03/27/2015   Dr. Jena Gauss: abnormal cecum and IC valve most consistent with IBD. TI intubated and appeared normal. Likely Crohn's disease. Patient denies NSAID use. No improvement with Lialda samples at time of initial diagnosis   COLONOSCOPY N/A 05/15/2017   Dr. Jena Gauss: ab normal IC valve, s/p biopsy with active ileitis, diverticulosis in sigmoid and descending colon. Non-bleeding internal hemorrhoids   ESOPHAGOGASTRODUODENOSCOPY     HAND SURGERY Right    Carpel tunnel and right elbow   LAPAROSCOPIC APPENDECTOMY N/A 07/21/2016   Procedure: APPENDECTOMY LAPAROSCOPIC;  Surgeon: Franky Macho, MD;  Location: [facility];  Service: General;  Laterality: N/A;   Small bowel capsule endoscopy  06/10/2005   RMR: single erosion versus AVM, distal ileum   Small bowel capsule endoscopy  prior to 05/2005   [place]: reported erosions/ulcerations per medical record. actual report unavailable.   Family History  Problem Relation Age of Onset   Hypertension Mother    Stroke Mother    Diabetes Father        w/ BKA   Hypertension Father    Asthma Father    Cancer Sister    Diabetes Sister        stomach   Breast cancer Sister    Diabetes Sister    Cancer Sister        breast   Hypertension  Sister    Diabetes Brother    Hypertension Daughter    Hypertension Son    Multiple sclerosis Son    Colon cancer Neg Hx    Inflammatory bowel disease Neg Hx    Social History   Socioeconomic History   Marital status: Married    Spouse name: Molly Maduro   Number of children: 5   Years of education: 9th grade-then got her GED at [employer]   Highest education level: GED or equivalent  Occupational History   Occupation: retired  Tobacco Use   Smoking status: Never   Smokeless tobacco: Never  Vaping Use   Vaping status: Never Used  Substance and Sexual Activity   Alcohol use: No   Drug use: No   Sexual activity: Not Currently  Other  Topics Concern   Not on file  Social History Narrative   Lives home with her husband - he is on dialysis - she helps care for him   Their children live nearby   She babysits her 72 year old grandchild   Social Determinants of Health   Financial Resource Strain: Low Risk  (06/03/2023)   Overall Financial Resource Strain (CARDIA)    Difficulty of Paying Living Expenses: Not hard at all  Food Insecurity: No Food Insecurity (06/03/2023)   Hunger Vital Sign    Worried About Running Out of Food in the Last Year: Never true    Ran Out of Food in the Last Year: Never true  Transportation Needs: No Transportation Needs (06/03/2023)   PRAPARE - Administrator, Civil Service (Medical): No    Lack of Transportation (Non-Medical): No  Physical Activity: Insufficiently Active (06/03/2023)   Exercise Vital Sign    Days of Exercise per Week: 3 days    Minutes of Exercise per Session: 30 min  Stress: No Stress Concern Present (06/03/2023)   [employer] of Occupational Health - Occupational Stress Questionnaire    Feeling of Stress : Not at all  Social Connections: Moderately Isolated (06/03/2023)   Social Connection and Isolation Panel [NHANES]    Frequency of Communication with Friends and Family: More than three times a week    Frequency of  Social Gatherings with Friends and Family: More than three times a week    Attends Religious Services: More than 4 times per year    Active Member of [employer] or Organizations: No    Attends Banker Meetings: Never    Marital Status: Widowed    Tobacco Counseling Counseling given: Not Answered   Clinical Intake:  Pre-visit preparation completed: Yes  Pain : No/denies pain     Nutritional Risks: None Diabetes: Yes CBG done?: No Did pt. bring in CBG monitor from home?: No  How often do you need to have someone help you when you read instructions, pamphlets, or other written materials from your doctor or pharmacy?: 1 - Never  Interpreter Needed?: No  Information entered by :: Renie Ora, LPN   Activities of Daily Living    06/03/2023   11:11 AM  In your present state of health, do you have any difficulty performing the following activities:  Hearing? 0  Vision? 0  Difficulty concentrating or making decisions? 0  Walking or climbing stairs? 0  Dressing or bathing? 0  Doing errands, shopping? 0  Preparing Food and eating ? N  Using the Toilet? N  In the past six months, have you accidently leaked urine? N  Do you have problems with loss of bowel control? N  Managing your Medications? N  Managing your Finances? N  Housekeeping or managing your Housekeeping? N    Patient Care Team: Dettinger, Elige Radon, MD as PCP - General (Family Medicine) Jena Gauss Gerrit Friends, MD as Consulting Physician (Gastroenterology) Gelene Mink, NP (Gastroenterology) Ernest Mallick, MD as Consulting Physician (Orthopedic Surgery) Adam Phenix, DPM as Consulting Physician (Podiatry) Sheran Luz, MD as Consulting Physician (Physical Medicine and Rehabilitation) Delora Fuel, OD (Optometry)  Indicate any recent Medical Services you may have received from other than [facility] providers in the past year (date may be approximate).     Assessment:   This is a routine wellness  examination for [place].  Hearing/Vision screen Vision Screening - Comments:: Wears rx glasses - up to date with routine  eye exams with  Dr.Johnson    Goals Addressed             This Visit's Progress    DIET - EAT MORE FRUITS AND VEGETABLES         Depression Screen    06/03/2023   11:10 AM 05/29/2023    9:39 AM 04/30/2023    3:26 PM 04/08/2023    2:54 PM 02/26/2023   10:32 AM 12/03/2022   10:47 AM 10/18/2022    9:39 AM  PHQ 2/9 Scores  PHQ - 2 Score 0 0 0 0 0 0 0  PHQ- 9 Score 0 0 1 0 0      Fall Risk    06/03/2023   11:07 AM 05/29/2023    9:38 AM 04/30/2023    3:29 PM 04/08/2023    2:54 PM 02/26/2023   10:32 AM  Fall Risk   Falls in the past year? 0 1 0 0 1  Number falls in past yr: 0 0 0  1  Injury with Fall? 0 0 0  0  Risk for fall due to : No Fall Risks Impaired balance/gait No Fall Risks  Impaired balance/gait  Follow up Falls prevention discussed Falls evaluation completed Falls evaluation completed  Falls evaluation completed    MEDICARE RISK AT HOME: Medicare Risk at Home Any stairs in or around the home?: Yes If so, are there any without handrails?: No Home free of loose throw rugs in walkways, pet beds, electrical cords, etc?: Yes Adequate lighting in your home to reduce risk of falls?: Yes Life alert?: No Use of a cane, walker or w/c?: No Grab bars in the bathroom?: Yes Shower chair or bench in shower?: Yes Elevated toilet seat or a handicapped toilet?: Yes  TIMED UP AND GO:  Was the test performed?  No    Cognitive Function:    05/19/2018   11:44 AM 05/01/2017   11:09 AM  MMSE - Mini Mental State Exam  Orientation to time 5 5  Orientation to Place 5 5  Registration 3 3  Attention/ Calculation 4 4  Recall 2 2  Language- name 2 objects 2 2  Language- repeat 1 1  Language- follow 3 step command 3 3  Language- read & follow direction 1 1  Write a sentence 1 1  Copy design 1 1  Total score 28 28        06/03/2023   11:12 AM 05/31/2022    11:09 AM 05/24/2020   10:40 AM 05/24/2019   10:49 AM  6CIT Screen  What Year? 0 points 0 points 0 points 0 points  What month? 0 points 0 points 0 points 0 points  What time? 0 points 0 points 0 points 0 points  Count back from 20 0 points 0 points 0 points 0 points  Months in reverse 0 points 0 points 0 points 0 points  Repeat phrase 0 points  0 points 0 points  Total Score 0 points  0 points 0 points    Immunizations Immunization History  Administered Date(s) Administered   Fluad Quad(high Dose 65+) 05/01/2020, 05/04/2021, 07/03/2022   Fluad Trivalent(High Dose 65+) 05/29/2023   Influenza, High Dose Seasonal PF 05/19/2018, 04/24/2019   Influenza,inj,Quad PF,6+ Mos 05/13/2013, 05/27/2014, 06/22/2015, 06/11/2016, 06/26/2017   Moderna Sars-Covid-2 Vaccination 09/23/2019, 10/26/2019, 06/26/2020, 12/27/2020   Pneumococcal Conjugate-13 07/08/2014   Pneumococcal Polysaccharide-23 12/16/2016   Tdap 12/30/2017   Zoster Recombinant(Shingrix) 07/14/2019, 12/12/2021    TDAP status: Up to date  Flu Vaccine status: Up to date  Pneumococcal vaccine status: Up to date  Covid-19 vaccine status: Completed vaccines  Qualifies for Shingles Vaccine? Yes   Zostavax completed Yes   Shingrix Completed?: Yes  Screening Tests Health Maintenance  Topic Date Due   COVID-19 Vaccine (5 - 2023-24 season) 04/13/2023   Diabetic kidney evaluation - Urine ACR  07/04/2023   DEXA SCAN  06/15/2023   OPHTHALMOLOGY EXAM  11/21/2023   HEMOGLOBIN A1C  11/27/2023   FOOT EXAM  02/26/2024   Diabetic kidney evaluation - eGFR measurement  05/20/2024   Medicare Annual Wellness (AWV)  06/02/2024   DTaP/Tdap/Td (2 - Td or Tdap) 12/31/2027   Pneumonia Vaccine 2+ Years old  Completed   INFLUENZA VACCINE  Completed   Hepatitis C Screening  Completed   Zoster Vaccines- Shingrix  Completed   HPV VACCINES  Aged Out   Colonoscopy  Discontinued    Health Maintenance  Health Maintenance Due  Topic Date Due    COVID-19 Vaccine (5 - 2023-24 season) 04/13/2023   Diabetic kidney evaluation - Urine ACR  07/04/2023    Colorectal cancer screening: No longer required.   Mammogram status: No longer required due to age.  Bone Density status: Ordered 06/03/2023. Pt provided with contact info and advised to call to schedule appt.  Lung Cancer Screening: (Low Dose CT Chest recommended if Age 64-80 years, 20 pack-year currently smoking OR have quit w/in 15years.) does not qualify.   Lung Cancer Screening Referral: n/a  Additional Screening:  Hepatitis C Screening: does not qualify; Completed 12/16/2016  Vision Screening: Recommended annual ophthalmology exams for early detection of glaucoma and other disorders of the eye. Is the patient up to date with their annual eye exam?  Yes  Who is the provider or what is the name of the office in which the patient attends annual eye exams? Dr.Johnson  If pt is not established with a provider, would they like to be referred to a provider to establish care? No .   Dental Screening: Recommended annual dental exams for proper oral hygiene  Diabetic Foot Exam: Diabetic Foot Exam: Overdue, Pt has been advised about the importance in completing this exam. Pt is scheduled for diabetic foot exam on next office visit .  Community Resource Referral / Chronic Care Management: CRR required this visit?  No   CCM required this visit?  No     Plan:     I have personally reviewed and noted the following in the patient's chart:   Medical and social history Use of alcohol, tobacco or illicit drugs  Current medications and supplements including opioid prescriptions. Patient is not currently taking opioid prescriptions. Functional ability and status Nutritional status Physical activity Advanced directives List of other physicians Hospitalizations, surgeries, and ER visits in previous 12 months Vitals Screenings to include cognitive, depression, and falls Referrals  and appointments  In addition, I have reviewed and discussed with patient certain preventive protocols, quality metrics, and best practice recommendations. A written personalized care plan for preventive services as well as general preventive health recommendations were provided to patient.     Lorrene Reid, LPN   19/14/7829   After Visit Summary: (MyChart) Due to this being a telephonic visit, the after visit summary with patients personalized plan was offered to patient via MyChart   Nurse Notes: none

## 2023-06-18 ENCOUNTER — Encounter: Payer: Self-pay | Admitting: Gastroenterology

## 2023-06-18 ENCOUNTER — Ambulatory Visit (INDEPENDENT_AMBULATORY_CARE_PROVIDER_SITE_OTHER): Payer: Medicare PPO | Admitting: Gastroenterology

## 2023-06-18 VITALS — BP 158/79 | HR 89 | Temp 98.2°F | Ht 62.0 in | Wt 169.4 lb

## 2023-06-18 DIAGNOSIS — K5080 Crohn's disease of both small and large intestine without complications: Secondary | ICD-10-CM

## 2023-06-18 NOTE — Progress Notes (Signed)
GI Office Note    Referring Provider: Dettinger, Elige Radon, MD Primary Care Physician:  Dettinger, Elige Radon, MD  Primary Gastroenterologist: Roetta Sessions, MD   Chief Complaint   Chief Complaint  Patient presents with   Follow-up    Follow up on crohn's. Pt states diarrhea has calmed down with medication    History of Present Illness   Felicia Pugh is a 77 y.o. female presenting today for follow up. Last seen 04/2023. 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   At last ov, with acute onset diarrhea while on antibiotics.  Stressors including loss of her husband due to spinal meningitis/sepsis in July as well, possibly playing a role.  Historically her Crohn's has been well-controlled on balsalazide.  Stool studies were negative for C. difficile. Sed rate 11, CRP 3.9, TSH normal. Fecal calprotectin 62, borderline. She was started on budesonide.   Today: Doing better but not quite at baseline.  Previously would have 1-2 stools daily.  Currently having a few stools in the morning but typically none throughout the remainder of the day.  No melena or rectal bleeding.  No abdominal pain.  No nausea/vomiting, heartburn.  If she needs to go on a long car ride or be away from her house and/or bathroom she sometimes takes loperamide to control bowel movements.  Typically no more than 1 to 1-1/2 tablets daily but does not take on a routine basis.  CT A/P with contrast 10/2022: IMPRESSION: 1. No acute findings within the abdomen or pelvis. 2. Status post appendectomy and hysterectomy. 3. 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 4. No findings to suggest active Crohn's disease at this time.  No evidence for bowel obstruction or inflammation. 5. Status post posterior hardware fixation of L2 through L5. 6.  Aortic Atherosclerosis (ICD10-I70.0).   Colonoscopy October 2018: - abnormal ileocecal valve?biopsied, focal active ileitis on biopsy - Diverticulosis in the sigmoid colon and in the descending colon. The examination was otherwise normal on direct and retroflexion views. - Non-bleeding internal hemorrhoids. -5 year surveillance colonoscopy recommended     Medications   Current Outpatient Medications  Medication Sig Dispense Refill   albuterol (VENTOLIN HFA) 108 (90 Base) MCG/ACT inhaler Inhale 2 puffs into the lungs every 6 (six) hours as needed for wheezing or shortness of breath. 8 g 2   Apoaequorin (PREVAGEN) 10 MG CAPS Take by mouth.     atorvastatin (LIPITOR) 40 MG tablet Take 1 tablet (40 mg total) by mouth daily. 90 tablet 3   balsalazide (COLAZAL) 750 MG capsule Take 2 capsules (1,500 mg total) by mouth in the morning and at bedtime. 360 capsule 3   budesonide (ENTOCORT EC) 3 MG 24 hr capsule Take 3 capsules (9 mg total) by mouth daily. 90 capsule 1   Elderberry-Vitamin C-Zinc (ELDERBERRY IMMUNE HEALTH GUMMY PO) Take by mouth.     Empagliflozin-linaGLIPtin (GLYXAMBI) 25-5 MG TABS Take 1 tablet by mouth daily. 90 tablet 3   glucose blood (ONETOUCH VERIO) test strip CHECK GLUCOSE TWICE DAILY Dx E11.69 200 each 3   insulin degludec (TRESIBA FLEXTOUCH) 100 UNIT/ML FlexTouch Pen INJECT 40 UNITS SUBCUTANEOUSLY ONCE DAILY 30 mL 3   Insulin Pen Needle (PEN NEEDLES) 31G X 5 MM MISC Use to  give insulin with flexpen 100 each 1   Insulin Syringe-Needle U-100 31G X 1/4" 1 ML MISC 1 Syringe by Does not apply route 2 (two) times daily. 60 each 6   loperamide (IMODIUM) 2 MG capsule Take 2 mg by mouth as needed for diarrhea or loose stools.     losartan (COZAAR) 25 MG tablet Take 1 tablet (25 mg total) by mouth daily. 90 tablet 3   Multiple Vitamins-Minerals (WOMENS 50+ MULTI  VITAMIN/MIN PO) Take by mouth.     nystatin powder APPLY POWDER TOPICALLY TO AFFECTED AREA THREE TIMES DAILY     No current facility-administered medications for this visit.    Allergies   Allergies as of 06/18/2023 - Review Complete 06/18/2023  Allergen Reaction Noted   Metformin and related  05/11/2014   Asa [aspirin]  11/04/2012   Nsaids Other (See Comments) 11/04/2012   Other Diarrhea 12/25/2014   Tolmetin Other (See Comments) 11/04/2012        Review of Systems   General: Negative for anorexia, weight loss, fever, chills, fatigue, weakness. ENT: Negative for hoarseness, difficulty swallowing , nasal congestion. CV: Negative for chest pain, angina, palpitations, dyspnea on exertion, peripheral edema.  Respiratory: Negative for dyspnea at rest, dyspnea on exertion, cough, sputum, wheezing.  GI: See history of present illness. GU:  Negative for dysuria, hematuria, urinary incontinence, urinary frequency, nocturnal urination.  Endo: Negative for unusual weight change.     Physical Exam   BP (!) 158/79   Pulse 89   Temp 98.2 F (36.8 C)   Ht 5\' 2"  (1.575 m)   Wt 169 lb 6.4 oz (76.8 kg)   BMI 30.98 kg/m    General: Well-nourished, well-developed in no acute distress.  Eyes: No icterus. Mouth: Oropharyngeal mucosa moist and pink   Abdomen: Bowel sounds are normal, nontender, nondistended, no hepatosplenomegaly or masses,  no abdominal bruits or hernia , no rebound or guarding.  Rectal: Not performed Extremities: No lower extremity edema. No clubbing or deformities. Neuro: Alert and oriented x 4   Skin: Warm and dry, no jaundice.   Psych: Alert and cooperative, normal mood and affect.  Labs   Lab Results  Component Value Date   NA 144 05/21/2023   CL 107 05/21/2023   K 4.2 05/21/2023   CO2 27 05/21/2023   BUN 19 05/21/2023   CREATININE 0.69 05/21/2023   EGFR 91 10/18/2022   CALCIUM 9.2 05/21/2023   ALBUMIN 4.0 10/12/2022   GLUCOSE 172 (H) 05/21/2023    Lab Results  Component Value Date   ALT 16 10/12/2022   AST 25 10/12/2022   ALKPHOS 112 10/12/2022   BILITOT 0.6 10/12/2022   Lab Results  Component Value Date   TSH 1.14 05/21/2023   Lab Results  Component Value Date   ESRSEDRATE 11 05/21/2023   Lab Results  Component Value Date   CRP 3.9 05/21/2023   Lab Results  Component Value Date   ALT 16 10/12/2022   AST 25 10/12/2022   ALKPHOS 112 10/12/2022   BILITOT 0.6 10/12/2022    Imaging Studies   No results found.  Assessment/Plan:   Ileocolonic Crohn's, mostly involving ICV -Recent flare, improved on budesonide -Complete 8 weeks of budesonide, if recurrent diarrhea she will let us know and may consider repeat course with taper -Continue balsalazide as before -No plans for surveillance colonoscopy given her age however if new symptoms or specifically for Crohn's issue/management, would consider colonoscopy at that time. -May use loperamide 2 mg  up to twice daily as needed      Leanna Battles. Melvyn Neth, MHS, PA-C [facility]

## 2023-06-18 NOTE — Patient Instructions (Signed)
Continue balsalazide 2 capsules by mouth in the morning and at bedtime for your Crohn's disease. You will complete a total of 8 weeks of budesonide 9 mg daily.  So make sure you get another refill when you complete your current bottle.  You should be off medication around first week of December.  When you stop the medication if you notice recurrent diarrhea, please let us know.  Please call my CMA Tammy at [phone] if needed.  You may use loperamide 2 mg up to twice daily as needed to control diarrhea. Return office visit in 1 year or call sooner if needed.

## 2023-07-04 ENCOUNTER — Encounter: Payer: Self-pay | Admitting: Family Medicine

## 2023-07-04 ENCOUNTER — Ambulatory Visit (INDEPENDENT_AMBULATORY_CARE_PROVIDER_SITE_OTHER): Payer: Medicare PPO | Admitting: Family Medicine

## 2023-07-04 VITALS — BP 147/81 | HR 111 | Temp 98.1°F | Ht 62.0 in | Wt 163.0 lb

## 2023-07-04 DIAGNOSIS — J683 Other acute and subacute respiratory conditions due to chemicals, gases, fumes and vapors: Secondary | ICD-10-CM

## 2023-07-04 DIAGNOSIS — J40 Bronchitis, not specified as acute or chronic: Secondary | ICD-10-CM | POA: Diagnosis not present

## 2023-07-04 MED ORDER — BENZONATATE 100 MG PO CAPS: 100.0000 mg | ORAL_CAPSULE | Freq: Three times a day (TID) | ORAL | 0 refills | Status: DC | PRN

## 2023-07-04 MED ORDER — PREDNISONE 20 MG PO TABS: ORAL_TABLET | ORAL | 0 refills | Status: DC

## 2023-07-04 MED ORDER — DOXYCYCLINE HYCLATE 100 MG PO TABS: 100.0000 mg | ORAL_TABLET | Freq: Two times a day (BID) | ORAL | 0 refills | Status: DC

## 2023-07-04 NOTE — Progress Notes (Signed)
BP (!) 147/81   Pulse (!) 111   Temp 98.1 F (36.7 C)   Ht 5\' 2"  (1.575 m)   Wt 163 lb (73.9 kg)   SpO2 91%   BMI 29.81 kg/m    Subjective:   Patient ID: Felicia Pugh, female    DOB: July 14, 1946, 77 y.o.   MRN: [id number]  HPI: Felicia Pugh is a 77 y.o. female presenting on 07/04/2023 for Cough (With sob)   HPI Shortness of breath and cough and congestion and wheezing. Patient is coming in today because she has been having shortness of breath and cough and congestion and wheezing that has been going on over the past 5 days.  She says she just not getting better.  She tried over-the-counter Coricidin and Robitussin and it does not seem to be helping.  She has been using her albuterol inhalers and nebulizer and those are not helping as much either and she still is having a lot of coughing spells and a lot of wheezing.  She denies any sick contacts that she knows of but she does have some grandbabies that she helps bring to and from school and one of them has been coughing a little bit.  Relevant past medical, surgical, family and social history reviewed and updated as indicated. Interim medical history since our last visit reviewed. Allergies and medications reviewed and updated.  Review of Systems  Constitutional:  Negative for chills and fever.  HENT:  Positive for congestion, postnasal drip, rhinorrhea and sinus pressure. Negative for ear discharge, ear pain, sneezing and sore throat.   Eyes:  Negative for pain, redness and visual disturbance.  Respiratory:  Positive for cough, shortness of breath and wheezing. Negative for chest tightness.   Cardiovascular:  Negative for chest pain and leg swelling.  Genitourinary:  Negative for difficulty urinating and dysuria.  Musculoskeletal:  Negative for back pain and gait problem.  Skin:  Negative for rash.  Neurological:  Negative for light-headedness and headaches.  Psychiatric/Behavioral:  Negative for agitation and behavioral  problems.   All other systems reviewed and are negative.   Per HPI unless specifically indicated above   Allergies as of 07/04/2023       Reactions   Metformin And Related    Severe diarrhea   Asa [aspirin]    GI Dr does not want her to take due to hx ulcers   Nsaids Other (See Comments)   GI Dr does not want her to take due to hx of ulcers Instructed by GI not to take    Other Diarrhea   Steroids   Tolmetin Other (See Comments)   GI Dr does not want her to take due to hx of ulcers GI Dr does not want her to take due to hx of ulcers        Medication List        Accurate as of July 04, 2023 12:01 PM. If you have any questions, ask your nurse or doctor.          albuterol 108 (90 Base) MCG/ACT inhaler Commonly known as: VENTOLIN HFA Inhale 2 puffs into the lungs every 6 (six) hours as needed for wheezing or shortness of breath.   atorvastatin 40 MG tablet Commonly known as: LIPITOR Take 1 tablet (40 mg total) by mouth daily.   balsalazide 750 MG capsule Commonly known as: COLAZAL Take 2 capsules (1,500 mg total) by mouth in the morning and at bedtime.   benzonatate 100 MG  capsule Commonly known as: Tessalon Perles Take 1 capsule (100 mg total) by mouth 3 (three) times daily as needed for cough. Started by: Elige Radon Yuvaan Olander   budesonide 3 MG 24 hr capsule Commonly known as: ENTOCORT EC Take 3 capsules (9 mg total) by mouth daily.   doxycycline 100 MG tablet Commonly known as: VIBRA-TABS Take 1 tablet (100 mg total) by mouth 2 (two) times daily. 1 po bid Started by: Elige Radon Bowie Delia   ELDERBERRY IMMUNE HEALTH GUMMY PO Take by mouth.   Glyxambi 25-5 MG Tabs Generic drug: Empagliflozin-linaGLIPtin Take 1 tablet by mouth daily.   Insulin Syringe-Needle U-100 31G X 1/4" 1 ML Misc 1 Syringe by Does not apply route 2 (two) times daily.   loperamide 2 MG capsule Commonly known as: IMODIUM Take 2 mg by mouth as needed for diarrhea or loose  stools.   losartan 25 MG tablet Commonly known as: COZAAR Take 1 tablet (25 mg total) by mouth daily.   nystatin powder APPLY POWDER TOPICALLY TO AFFECTED AREA THREE TIMES DAILY   OneTouch Verio test strip Generic drug: glucose blood CHECK GLUCOSE TWICE DAILY Dx E11.69   Pen Needles 31G X 5 MM Misc Use to give insulin with flexpen   predniSONE 20 MG tablet Commonly known as: DELTASONE 2 po at same time daily for 5 days Started by: Elige Radon Joanna Borawski   Prevagen 10 MG Caps Generic drug: Apoaequorin Take by mouth.   Evaristo Bury FlexTouch 100 UNIT/ML FlexTouch Pen Generic drug: insulin degludec INJECT 40 UNITS SUBCUTANEOUSLY ONCE DAILY   WOMENS 50+ MULTI VITAMIN/MIN PO Take by mouth.         Objective:   BP (!) 147/81   Pulse (!) 111   Temp 98.1 F (36.7 C)   Ht 5\' 2"  (1.575 m)   Wt 163 lb (73.9 kg)   SpO2 91%   BMI 29.81 kg/m   Wt Readings from Last 3 Encounters:  07/04/23 163 lb (73.9 kg)  06/18/23 169 lb 6.4 oz (76.8 kg)  06/03/23 168 lb (76.2 kg)    Physical Exam Vitals reviewed.  Constitutional:      General: She is not in acute distress.    Appearance: She is well-developed. She is not diaphoretic.  HENT:     Right Ear: Tympanic membrane, ear canal and external ear normal.     Left Ear: Tympanic membrane, ear canal and external ear normal.     Nose: Mucosal edema and rhinorrhea present.     Right Sinus: No maxillary sinus tenderness or frontal sinus tenderness.     Left Sinus: No maxillary sinus tenderness or frontal sinus tenderness.     Mouth/Throat:     Pharynx: Uvula midline. Posterior oropharyngeal erythema present. No oropharyngeal exudate.     Tonsils: No tonsillar abscesses.  Eyes:     Conjunctiva/sclera: Conjunctivae normal.  Cardiovascular:     Rate and Rhythm: Normal rate and regular rhythm.     Heart sounds: Normal heart sounds. No murmur heard. Pulmonary:     Effort: Pulmonary effort is normal. No respiratory distress.     Breath  sounds: Wheezing and rhonchi present. No rales.  Chest:     Chest wall: No tenderness.  Musculoskeletal:        General: No tenderness. Normal range of motion.  Skin:    General: Skin is warm and dry.     Findings: No rash.  Neurological:     Mental Status: She is alert and oriented to person,  place, and time.     Coordination: Coordination normal.  Psychiatric:        Behavior: Behavior normal.       Assessment & Plan:   Problem List Items Addressed This Visit   None Visit Diagnoses     Bronchitis    -  Primary   Relevant Medications   predniSONE (DELTASONE) 20 MG tablet   doxycycline (VIBRA-TABS) 100 MG tablet   benzonatate (TESSALON PERLES) 100 MG capsule   Reactive airways dysfunction syndrome (HCC)       Relevant Medications   predniSONE (DELTASONE) 20 MG tablet   doxycycline (VIBRA-TABS) 100 MG tablet   benzonatate (TESSALON PERLES) 100 MG capsule       Will treat like possible reactive airways with congestion and wheezing, will give steroids and antibiotic.  Recommended a probiotic to help with this symptoms and diarrhea that she gets from antibiotics. Follow up plan: Return if symptoms worsen or fail to improve.  Counseling provided for all of the vaccine components No orders of the defined types were placed in this encounter.   Arville Care, MD [facility] 07/04/2023, 12:01 PM

## 2023-07-07 ENCOUNTER — Other Ambulatory Visit: Payer: Self-pay | Admitting: Gastroenterology

## 2023-07-07 DIAGNOSIS — K219 Gastro-esophageal reflux disease without esophagitis: Secondary | ICD-10-CM

## 2023-07-08 DIAGNOSIS — B351 Tinea unguium: Secondary | ICD-10-CM | POA: Diagnosis not present

## 2023-07-08 DIAGNOSIS — E1142 Type 2 diabetes mellitus with diabetic polyneuropathy: Secondary | ICD-10-CM | POA: Diagnosis not present

## 2023-07-08 DIAGNOSIS — L84 Corns and callosities: Secondary | ICD-10-CM | POA: Diagnosis not present

## 2023-07-08 DIAGNOSIS — M79676 Pain in unspecified toe(s): Secondary | ICD-10-CM | POA: Diagnosis not present

## 2023-08-07 ENCOUNTER — Ambulatory Visit: Payer: Self-pay | Admitting: Family Medicine

## 2023-08-07 NOTE — Telephone Encounter (Signed)
Copied from CRM [id number]. Topic: Clinical - Red Word Triage >> Aug 07, 2023 12:53 PM Gaetano Hawthorne wrote: Red Word that prompted transfer to Nurse Triage: cough, shortness of breath, exhaustion - no fever.   Chief Complaint: Cough and Shortness of Breath Symptoms: Cough Productive (Morning), Mild SOB Frequency: 2-3 days Pertinent Negatives: Patient denies chest pain, orthopnea, or hemoptysis Disposition: [] ED /[x] Urgent Care (no appt availability in office) / [] Appointment(In office/virtual)/ []  [facility]/ [] Home Care/ [] Refused Recommended Disposition /[] [facility]/ []  Follow-up with PCP Additional Notes: Y. Favorite is a 77 year old female being triaged for a cough and shortness of breath. The patient states this has been ongoing for 2-3 days and she would like to be treated as soon as possible. Referred the patient to the nearest [facility] UC, patient agrees to disposition. No in office availability today.    Reason for Disposition  [1] MILD difficulty breathing (e.g., minimal/no SOB at rest, SOB with walking, pulse <100) AND [2] still present when not coughing  Answer Assessment - Initial Assessment Questions 1. ONSET: "When did the cough begin?"      2-3 days.  2. SEVERITY: "How bad is the cough today?"      Moderate  3. SPUTUM: "Describe the color of your sputum" (none, dry cough; clear, white, yellow, green)     Pale White  4. HEMOPTYSIS: "Are you coughing up any blood?" If so ask: "How much?" (flecks, streaks, tablespoons, etc.)     No  5. DIFFICULTY BREATHING: "Are you having difficulty breathing?" If Yes, ask: "How bad is it?" (e.g., mild, moderate, severe)    - MILD: No SOB at rest, mild SOB with walking, speaks normally in sentences, can lie down, no retractions, pulse < 100.    - MODERATE: SOB at rest, SOB with minimal exertion and prefers to sit, cannot lie down flat, speaks in phrases, mild retractions, audible wheezing, pulse 100-120.    - SEVERE:  Very SOB at rest, speaks in single words, struggling to breathe, sitting hunched forward, retractions, pulse > 120      Mild  6. FEVER: "Do you have a fever?" If Yes, ask: "What is your temperature, how was it measured, and when did it start?"     No  7. CARDIAC HISTORY: "Do you have any history of heart disease?" (e.g., heart attack, congestive heart failure)      No  8. LUNG HISTORY: "Do you have any history of lung disease?"  (e.g., pulmonary embolus, asthma, emphysema)     No, Family History  9. PE RISK FACTORS: "Do you have a history of blood clots?" (or: recent major surgery, recent prolonged travel, bedridden)     No  10. OTHER SYMPTOMS: "Do you have any other symptoms?" (e.g., runny nose, wheezing, chest pain)        Occasional chest tightness, runny nose, wheezing,  11. PREGNANCY: "Is there any chance you are pregnant?" "When was your last menstrual period?"       No  12. TRAVEL: "Have you traveled out of the country in the last month?" (e.g., travel history, exposures)       No  Protocols used: Cough - Acute Productive-A-AH

## 2023-08-14 DIAGNOSIS — M4326 Fusion of spine, lumbar region: Secondary | ICD-10-CM | POA: Diagnosis not present

## 2023-08-14 DIAGNOSIS — I10 Essential (primary) hypertension: Secondary | ICD-10-CM | POA: Diagnosis not present

## 2023-08-18 ENCOUNTER — Encounter: Payer: Self-pay | Admitting: Family Medicine

## 2023-08-18 ENCOUNTER — Ambulatory Visit (INDEPENDENT_AMBULATORY_CARE_PROVIDER_SITE_OTHER): Payer: Medicare PPO | Admitting: Family Medicine

## 2023-08-18 VITALS — BP 163/81 | HR 86 | Temp 97.2°F | Ht 62.0 in | Wt 166.5 lb

## 2023-08-18 DIAGNOSIS — J208 Acute bronchitis due to other specified organisms: Secondary | ICD-10-CM

## 2023-08-18 DIAGNOSIS — B9689 Other specified bacterial agents as the cause of diseases classified elsewhere: Secondary | ICD-10-CM | POA: Diagnosis not present

## 2023-08-18 MED ORDER — BETAMETHASONE SOD PHOS & ACET 6 (3-3) MG/ML IJ SUSP
6.0000 mg | Freq: Once | INTRAMUSCULAR | Status: AC
  Administered 2023-08-18: 6 mg via INTRAMUSCULAR

## 2023-08-18 MED ORDER — HYDROCODONE BIT-HOMATROP MBR 5-1.5 MG/5ML PO SOLN: 5.0000 mL | Freq: Four times a day (QID) | ORAL | 0 refills | Status: AC | PRN

## 2023-08-18 MED ORDER — AMOXICILLIN-POT CLAVULANATE 875-125 MG PO TABS: 1.0000 | ORAL_TABLET | Freq: Two times a day (BID) | ORAL | 0 refills | Status: DC

## 2023-08-18 NOTE — Progress Notes (Signed)
 Chief Complaint  Patient presents with   Cough    FOR 2 WEEKS SOB HEAD ACHE AND WEAKNESS     HPI  Patient presents today for SUBJECTIVE:  Felicia Pugh is a 78 y.o. female who complains of congestion, dry cough, headache, and low grade fevers for 7 days. She denies a history of chest pain, dizziness, nausea, vomiting, and sputum production and denies a history of asthma. Patient denies smoke cigarettes.  PMH: Smoking status noted ROS: Per HPI  Objective: BP (!) 163/81   Pulse 86   Temp (!) 97.2 F (36.2 C) (Temporal)   Ht 5' 2 (1.575 m)   Wt 166 lb 8 oz (75.5 kg)   SpO2 95%   BMI 30.45 kg/m  Gen: NAD, alert, cooperative with exam HEENT: NCAT, EOMI, PERRL CV: RRR, good S1/S2, no murmur Resp:Few scattered rhonchi Ext: No edema, warm Neuro: Alert and oriented, No gross deficits  Assessment and plan:  1. Acute bacterial bronchitis     Meds ordered this encounter  Medications   amoxicillin -clavulanate (AUGMENTIN ) 875-125 MG tablet    Sig: Take 1 tablet by mouth 2 (two) times daily. Take all of this medication    Dispense:  20 tablet    Refill:  0   HYDROcodone  bit-homatropine (HYCODAN) 5-1.5 MG/5ML syrup    Sig: Take 5 mLs by mouth every 6 (six) hours as needed for up to 5 days for cough.    Dispense:  100 mL    Refill:  0   betamethasone  acetate-betamethasone  sodium phosphate (CELESTONE ) injection 6 mg    Orders Placed This Encounter  Procedures   CBC with Differential/Platelet   CMP14+EGFR    Has the patient fasted?:   Yes    Release to patient:   Immediate    Follow up as needed.  Butler Der, MD

## 2023-08-19 LAB — CBC WITH DIFFERENTIAL/PLATELET
Basophils Absolute: 0.1 10*3/uL (ref 0.0–0.2)
Basos: 1 %
EOS (ABSOLUTE): 0.1 10*3/uL (ref 0.0–0.4)
Eos: 1 %
Hematocrit: 40.4 % (ref 34.0–46.6)
Hemoglobin: 12.6 g/dL (ref 11.1–15.9)
Immature Grans (Abs): 0.1 10*3/uL (ref 0.0–0.1)
Immature Granulocytes: 1 %
Lymphocytes Absolute: 1.6 10*3/uL (ref 0.7–3.1)
Lymphs: 14 %
MCH: 28.3 pg (ref 26.6–33.0)
MCHC: 31.2 g/dL — ABNORMAL LOW (ref 31.5–35.7)
MCV: 91 fL (ref 79–97)
Monocytes Absolute: 1 10*3/uL — ABNORMAL HIGH (ref 0.1–0.9)
Monocytes: 8 %
Neutrophils Absolute: 9 10*3/uL — ABNORMAL HIGH (ref 1.4–7.0)
Neutrophils: 75 %
Platelets: 267 10*3/uL (ref 150–450)
RBC: 4.45 x10E6/uL (ref 3.77–5.28)
RDW: 12.6 % (ref 11.7–15.4)
WBC: 11.9 10*3/uL — ABNORMAL HIGH (ref 3.4–10.8)

## 2023-08-19 LAB — CMP14+EGFR
ALT: 16 IU/L (ref 0–32)
AST: 23 IU/L (ref 0–40)
Albumin: 4.1 g/dL (ref 3.8–4.8)
Alkaline Phosphatase: 137 IU/L — ABNORMAL HIGH (ref 44–121)
BUN/Creatinine Ratio: 18 (ref 12–28)
BUN: 14 mg/dL (ref 8–27)
Bilirubin Total: 0.4 mg/dL (ref 0.0–1.2)
CO2: 25 mmol/L (ref 20–29)
Calcium: 9.2 mg/dL (ref 8.7–10.3)
Chloride: 108 mmol/L — ABNORMAL HIGH (ref 96–106)
Creatinine, Ser: 0.76 mg/dL (ref 0.57–1.00)
Globulin, Total: 2.5 g/dL (ref 1.5–4.5)
Glucose: 83 mg/dL (ref 70–99)
Potassium: 4.6 mmol/L (ref 3.5–5.2)
Sodium: 147 mmol/L — ABNORMAL HIGH (ref 134–144)
Total Protein: 6.6 g/dL (ref 6.0–8.5)
eGFR: 81 mL/min/{1.73_m2} (ref >59)

## 2023-08-19 NOTE — Progress Notes (Signed)
 Hello Arlisha,  Your lab result is normal and/or stable.Some minor variations that are not significant are commonly marked abnormal, but do not represent any medical problem for you.  Best regards, Mechele Claude, M.D.

## 2023-08-20 ENCOUNTER — Other Ambulatory Visit: Payer: Self-pay | Admitting: Physician Assistant

## 2023-08-20 DIAGNOSIS — M5416 Radiculopathy, lumbar region: Secondary | ICD-10-CM

## 2023-08-20 DIAGNOSIS — M4326 Fusion of spine, lumbar region: Secondary | ICD-10-CM

## 2023-08-29 ENCOUNTER — Other Ambulatory Visit

## 2023-08-29 ENCOUNTER — Encounter: Payer: Self-pay | Admitting: Family Medicine

## 2023-08-29 ENCOUNTER — Ambulatory Visit: Payer: Medicare PPO | Admitting: Family Medicine

## 2023-08-29 VITALS — BP 114/74 | HR 94 | Ht 62.0 in | Wt 165.0 lb

## 2023-08-29 DIAGNOSIS — I152 Hypertension secondary to endocrine disorders: Secondary | ICD-10-CM | POA: Diagnosis not present

## 2023-08-29 DIAGNOSIS — R051 Acute cough: Secondary | ICD-10-CM | POA: Diagnosis not present

## 2023-08-29 DIAGNOSIS — Z780 Asymptomatic menopausal state: Secondary | ICD-10-CM | POA: Diagnosis not present

## 2023-08-29 DIAGNOSIS — E1169 Type 2 diabetes mellitus with other specified complication: Secondary | ICD-10-CM

## 2023-08-29 DIAGNOSIS — E785 Hyperlipidemia, unspecified: Secondary | ICD-10-CM

## 2023-08-29 DIAGNOSIS — E1159 Type 2 diabetes mellitus with other circulatory complications: Secondary | ICD-10-CM

## 2023-08-29 DIAGNOSIS — Z794 Long term (current) use of insulin: Secondary | ICD-10-CM

## 2023-08-29 LAB — BAYER DCA HB A1C WAIVED: HB A1C (BAYER DCA - WAIVED): 8 % — ABNORMAL HIGH (ref 4.8–5.6)

## 2023-08-29 MED ORDER — LORATADINE 10 MG PO TABS: 10.0000 mg | ORAL_TABLET | Freq: Every day | ORAL | 11 refills | Status: DC

## 2023-08-29 MED ORDER — FLUTICASONE PROPIONATE 50 MCG/ACT NA SUSP: 1.0000 | Freq: Two times a day (BID) | NASAL | 6 refills | Status: DC | PRN

## 2023-08-29 NOTE — Progress Notes (Signed)
BP 114/74   Pulse 94   Ht 5\' 2"  (1.575 m)   Wt 165 lb (74.8 kg)   SpO2 94%   BMI 30.18 kg/m    Subjective:   Patient ID: Felicia Pugh, female    DOB: 09-Jun-1946, 78 y.o.   MRN: [id number]  HPI: PATRICK SOHM is a 78 y.o. female presenting on 08/29/2023 for Medical Management of Chronic Issues and Diabetes  Type 2 diabetes mellitus Patient is currently taking Glyxambi and [place]. She recently completed a course of steroids for bronchitis, so her fasting blood sugar has ranged between 150-190s at home. Patient denies any hypoglycemic episodes. Patient has seen an ophthalmologist this year. Patient denies any new issues with their feet. Her diabetes is complicated by hypertension and hyperlipidemia.   Hypertension Patient is currently taking losartan. Her blood pressure today is 114/74. Blood pressure at home averages around 170/80s. She reports occasional lightheadedness upon standing but denies dizziness, vision changes, chest pain, or shortness of breath.    Hyperlipidemia Patient is currently taking atorvastatin. She denies myalgias or weakness. She does not have a history of liver damage from it.   Congestion Patient reports a 77-month history of cough, congestion, chest tightness, sinus pressure, headaches, and sore throat. She has completed two courses of steroids and antibiotics during this time. She feels like her symptoms somewhat improve with the medications but return shortly after finishing them. She reports the cough is productive of yellow phlegm and rhinorrhea is cream/yellow mucus. She has not tried any OTC medications. She has tried her albuterol as prescribed but did not notice any changes.  Relevant past medical, surgical, family and social history reviewed and updated as indicated. Interim medical history since our last visit reviewed. Allergies and medications reviewed and updated.  Review of Systems  Constitutional:  Positive for fatigue. Negative for chills and  fever.  HENT:  Positive for congestion, postnasal drip, rhinorrhea, sinus pressure and sore throat. Negative for ear pain, sneezing and trouble swallowing.   Eyes:  Negative for pain and visual disturbance.  Respiratory:  Positive for cough and chest tightness. Negative for shortness of breath and wheezing.   Cardiovascular:  Negative for chest pain, palpitations and leg swelling.  Gastrointestinal:  Positive for diarrhea (from her crohn's disease). Negative for abdominal distention, abdominal pain and constipation.  Genitourinary:  Negative for difficulty urinating, dysuria, frequency and hematuria.  Musculoskeletal:  Positive for back pain. Negative for myalgias.  Skin:  Negative for rash.  Neurological:  Positive for light-headedness and headaches. Negative for syncope and weakness.  Psychiatric/Behavioral:  Negative for dysphoric mood. The patient is not nervous/anxious.     Per HPI unless specifically indicated above   Allergies as of 08/29/2023       Reactions   Metformin And Related    Severe diarrhea   Asa [aspirin]    GI Dr does not want her to take due to hx ulcers   Nsaids Other (See Comments)   GI Dr does not want her to take due to hx of ulcers Instructed by GI not to take    Other Diarrhea   Steroids   Tolmetin Other (See Comments)   GI Dr does not want her to take due to hx of ulcers GI Dr does not want her to take due to hx of ulcers        Medication List        Accurate as of August 29, 2023 11:59 PM. If you have  any questions, ask your nurse or doctor.          STOP taking these medications    amoxicillin-clavulanate 875-125 MG tablet Commonly known as: AUGMENTIN Stopped by: Elige Radon Isis Costanza   budesonide 3 MG 24 hr capsule Commonly known as: ENTOCORT EC Stopped by: Elige Radon Matti Killingsworth   predniSONE 20 MG tablet Commonly known as: DELTASONE Stopped by: Elige Radon Jaquelinne Glendening       TAKE these medications    albuterol 108 (90 Base) MCG/ACT  inhaler Commonly known as: VENTOLIN HFA Inhale 2 puffs into the lungs every 6 (six) hours as needed for wheezing or shortness of breath.   atorvastatin 40 MG tablet Commonly known as: LIPITOR Take 1 tablet (40 mg total) by mouth daily.   balsalazide 750 MG capsule Commonly known as: COLAZAL TAKE 2 CAPSULES BY MOUTH IN THE MORNING AND AT BEDTIME   ELDERBERRY IMMUNE HEALTH GUMMY PO Take by mouth.   fluticasone 50 MCG/ACT nasal spray Commonly known as: FLONASE Place 1 spray into both nostrils 2 (two) times daily as needed for allergies or rhinitis. Started by: Elige Radon Fayetta Sorenson   Glyxambi 25-5 MG Tabs Generic drug: Empagliflozin-linaGLIPtin Take 1 tablet by mouth daily.   Insulin Syringe-Needle U-100 31G X 1/4" 1 ML Misc 1 Syringe by Does not apply route 2 (two) times daily.   loperamide 2 MG capsule Commonly known as: IMODIUM Take 2 mg by mouth as needed for diarrhea or loose stools.   loratadine 10 MG tablet Commonly known as: CLARITIN Take 1 tablet (10 mg total) by mouth daily. Started by: Elige Radon Kyle Stansell   losartan 25 MG tablet Commonly known as: COZAAR Take 1 tablet (25 mg total) by mouth daily.   nystatin powder APPLY POWDER TOPICALLY TO AFFECTED AREA THREE TIMES DAILY   OneTouch Verio test strip Generic drug: glucose blood CHECK GLUCOSE TWICE DAILY Dx E11.69   Pen Needles 31G X 5 MM Misc Use to give insulin with flexpen   Prevagen 10 MG Caps Generic drug: Apoaequorin Take by mouth.   Evaristo Bury FlexTouch 100 UNIT/ML FlexTouch Pen Generic drug: insulin degludec INJECT 40 UNITS SUBCUTANEOUSLY ONCE DAILY   WOMENS 50+ MULTI VITAMIN/MIN PO Take by mouth.         Objective:   BP 114/74   Pulse 94   Ht 5\' 2"  (1.575 m)   Wt 165 lb (74.8 kg)   SpO2 94%   BMI 30.18 kg/m   Wt Readings from Last 3 Encounters:  08/29/23 165 lb (74.8 kg)  08/18/23 166 lb 8 oz (75.5 kg)  07/04/23 163 lb (73.9 kg)    Physical Exam Vitals and nursing note reviewed.   Constitutional:      Appearance: Normal appearance. She is obese. She is not ill-appearing.  HENT:     Head: Normocephalic and atraumatic.     Comments: Tenderness to palpation over bilateral frontal sinuses    Right Ear: Tympanic membrane, ear canal and external ear normal.     Left Ear: Tympanic membrane, ear canal and external ear normal.     Nose: Congestion and rhinorrhea present.     Mouth/Throat:     Mouth: Mucous membranes are moist.     Pharynx: Posterior oropharyngeal erythema present.  Eyes:     Conjunctiva/sclera: Conjunctivae normal.  Cardiovascular:     Rate and Rhythm: Normal rate and regular rhythm.     Heart sounds: Normal heart sounds.  Pulmonary:     Effort: Pulmonary effort is normal.  Breath sounds: Normal breath sounds. No wheezing or rales.  Abdominal:     General: Abdomen is flat. There is no distension.     Palpations: Abdomen is soft.     Tenderness: There is no abdominal tenderness. There is no right CVA tenderness or left CVA tenderness.  Musculoskeletal:     Cervical back: Normal range of motion and neck supple. No tenderness.     Right lower leg: No edema.     Left lower leg: No edema.  Lymphadenopathy:     Cervical: No cervical adenopathy.  Skin:    General: Skin is warm and dry.  Neurological:     Mental Status: She is alert and oriented to person, place, and time.  Psychiatric:        Mood and Affect: Mood normal.        Behavior: Behavior normal.        Thought Content: Thought content normal.        Judgment: Judgment normal.     Results for orders placed or performed in visit on 08/29/23  Microalbumin / creatinine urine ratio   Collection Time: 08/29/23 10:34 AM  Result Value Ref Range   Creatinine, Urine 92.0 Not Estab. mg/dL   Microalbumin, Urine 16.1 Not Estab. ug/mL   Microalb/Creat Ratio 96 (H) 0 - 29 mg/g creat  Bayer DCA Hb A1c Waived   Collection Time: 08/29/23 10:34 AM  Result Value Ref Range   HB A1C (BAYER DCA -  WAIVED) 8.0 (H) 4.8 - 5.6 %    Assessment & Plan:   Problem List Items Addressed This Visit       Cardiovascular and Mediastinum   Hypertension associated with diabetes (HCC)     Endocrine   Hyperlipidemia associated with type 2 diabetes mellitus (HCC)   Type 2 diabetes mellitus with other specified complication (HCC) - Primary   Relevant Orders   Microalbumin / creatinine urine ratio (Completed)   Bayer DCA Hb A1c Waived (Completed)   Other Visit Diagnoses       Postmenopausal       Relevant Orders   DG [facility] DEXA     Acute cough       Relevant Medications   loratadine (CLARITIN) 10 MG tablet   fluticasone (FLONASE) 50 MCG/ACT nasal spray       Blood pressure well-controlled at 114/74 in office. Unsure why her blood pressure has been elevated in the 170/80s at home. Her blood pressure cuff may need to be checked for accuracy, but recommended patient monitor her blood pressure daily at home over the next 2 weeks and notify us if her blood pressure remains elevated. Suspect blood glucose may be elevated due to recent steroids, but will check HgbA1c today. Will send prescription for loratadine and Flonase to help with nasal congestion. Can consider referral to ENT if symptoms fail to resolve over the coming weeks.  Follow up plan: Return in about 3 months (around 11/27/2023), or if symptoms worsen or fail to improve, for Diabetes recheck.  Counseling provided for all of the vaccine components Orders Placed This Encounter  Procedures   DG [facility] DEXA   Microalbumin / creatinine urine ratio   Bayer DCA Hb A1c Waived    Gillermina Phy, Medical Student [facility] 09/10/2023, 11:35 AM  I was personally present for all components of the history, physical exam and/or medical decision making.  I agree with the documentation performed by the student and agree with assessment and plan  above.  Blood pressure looks great today, do not know why it has been elevated  at home but looks good today, continue to monitor.  Check blood work on the way out. Arville Care, MD [facility] 09/10/2023, 11:35 AM

## 2023-08-30 LAB — MICROALBUMIN / CREATININE URINE RATIO
Creatinine, Urine: 92 mg/dL
Microalb/Creat Ratio: 96 mg/g{creat} — ABNORMAL HIGH (ref 0–29)
Microalbumin, Urine: 88.7 ug/mL

## 2023-09-01 ENCOUNTER — Encounter: Payer: Self-pay | Admitting: Family Medicine

## 2023-09-02 ENCOUNTER — Encounter: Payer: Self-pay | Admitting: Physician Assistant

## 2023-09-05 ENCOUNTER — Ambulatory Visit
Admission: RE | Admit: 2023-09-05 | Discharge: 2023-09-05 | Disposition: A | Discharge status: Home / Self Care | Payer: Medicare PPO | Source: Ambulatory Visit | Attending: Physician Assistant | Admitting: Physician Assistant

## 2023-09-05 DIAGNOSIS — M4326 Fusion of spine, lumbar region: Secondary | ICD-10-CM

## 2023-09-05 DIAGNOSIS — M545 Low back pain, unspecified: Secondary | ICD-10-CM | POA: Diagnosis not present

## 2023-09-05 DIAGNOSIS — M5416 Radiculopathy, lumbar region: Secondary | ICD-10-CM

## 2023-09-05 DIAGNOSIS — Z981 Arthrodesis status: Secondary | ICD-10-CM | POA: Diagnosis not present

## 2023-09-11 DIAGNOSIS — Z6832 Body mass index (BMI) 32.0-32.9, adult: Secondary | ICD-10-CM | POA: Diagnosis not present

## 2023-09-11 DIAGNOSIS — I10 Essential (primary) hypertension: Secondary | ICD-10-CM | POA: Diagnosis not present

## 2023-09-11 DIAGNOSIS — M4326 Fusion of spine, lumbar region: Secondary | ICD-10-CM | POA: Diagnosis not present

## 2023-09-11 DIAGNOSIS — M7062 Trochanteric bursitis, left hip: Secondary | ICD-10-CM | POA: Diagnosis not present

## 2023-09-12 ENCOUNTER — Telehealth: Payer: Self-pay | Admitting: Family Medicine

## 2023-09-12 DIAGNOSIS — R053 Chronic cough: Secondary | ICD-10-CM

## 2023-09-12 DIAGNOSIS — R0981 Nasal congestion: Secondary | ICD-10-CM

## 2023-09-12 NOTE — Telephone Encounter (Signed)
Copied from CRM [id number]. Topic: Referral - Request for Referral >> Sep 12, 2023  1:34 PM Carlatta H wrote: Did the patient discuss referral with their provider in the last year? Yes (If No - schedule appointment) (If Yes - send message)  Appointment offered? No  Type of order/referral and detailed reason for visit: Sinus   Preference of office, provider, location: [place] or [place]  If referral order, have you been seen by this specialty before? No (If Yes, this issue or another issue? When? Where?  Can we respond through MyChart? No

## 2023-09-15 NOTE — Telephone Encounter (Signed)
Got and placed referral to ENT for her, she prefers [facility] if there is 1 but I do not know that there is.  Diagnosis chronic cough and sinus congestion

## 2023-09-15 NOTE — Telephone Encounter (Signed)
Pt made aware. States that she already establish with ENT on [facility]. She will call them for an appt.  Pt would also like to schedule her Dexa. Message routed to [facility] to schedule.

## 2023-09-16 DIAGNOSIS — L84 Corns and callosities: Secondary | ICD-10-CM | POA: Diagnosis not present

## 2023-09-16 DIAGNOSIS — E1142 Type 2 diabetes mellitus with diabetic polyneuropathy: Secondary | ICD-10-CM | POA: Diagnosis not present

## 2023-09-16 DIAGNOSIS — M79676 Pain in unspecified toe(s): Secondary | ICD-10-CM | POA: Diagnosis not present

## 2023-09-16 DIAGNOSIS — B351 Tinea unguium: Secondary | ICD-10-CM | POA: Diagnosis not present

## 2023-09-17 ENCOUNTER — Telehealth: Payer: Self-pay

## 2023-09-17 NOTE — Telephone Encounter (Signed)
 Copied from CRM [id number]. Topic: Appointments - Scheduling Inquiry for Clinic >> Sep 17, 2023  1:49 PM Lennie Ra H wrote: Reason for CRM: Patient needs to get scheduled for bone density test

## 2023-09-17 NOTE — Telephone Encounter (Signed)
 Called and LMTCB.

## 2023-09-18 ENCOUNTER — Ambulatory Visit: Payer: Self-pay | Admitting: Family Medicine

## 2023-09-18 NOTE — Telephone Encounter (Signed)
 Chief Complaint: Left hip pain Symptoms: Hip pain radiating to left leg Frequency: ongoing for 2 months Pertinent Negatives: Patient denies n/a Disposition: [] ED /[] Urgent Care (no appt availability in office) / [x] Appointment(In office/virtual)/ []  [facility]/ [] Home Care/ [x] Refused Recommended Disposition /[] [place] [facility]/ []  Follow-up with PCP Additional Notes: Patient called in stating she had recently received a shot in her let hip to help with the pain but it has not resolved any pain. Patient was calling to get back into the office, but realized her son had dialed the wrong number. Patient is not looking to come back into PCP at this time, but instead was attempting to reach the [facility]. Patient states she will call us  back with further needs after she follows up with spine clinic.    Copied from CRM [id number]. Topic: Clinical - Red Word Triage >> Sep 18, 2023  2:24 PM Georgia RAMAN wrote: Red Word that prompted transfer to Nurse Triage: Pain, worsening Reason for Disposition  [1] MODERATE pain (e.g., interferes with normal activities, limping) AND [2] present > 3 days  Answer Assessment - Initial Assessment Questions 1. LOCATION and RADIATION: Where is the pain located?      Left hip and down left leg 2. QUALITY: What does the pain feel like?  (e.g., sharp, dull, aching, burning)     Worsened pain with activity 3. SEVERITY: How bad is the pain? What does it keep you from doing?   (Scale 1-10; or mild, moderate, severe)   -  MILD (1-3): doesn't interfere with normal activities    -  MODERATE (4-7): interferes with normal activities (e.g., work or school) or awakens from sleep, limping    -  SEVERE (8-10): excruciating pain, unable to do any normal activities, unable to walk     8/10 4. ONSET: When did the pain start? Does it come and go, or is it there all the time?     After Christmas 6. CAUSE: What do you think is causing the hip pain?       No 7. AGGRAVATING FACTORS: What makes the hip pain worse? (e.g., walking, climbing stairs, running)     Sitting for a while and walking around too long 8. OTHER SYMPTOMS: Do you have any other symptoms? (e.g., back pain, pain shooting down leg,  fever, rash)     Shooting down left leg  Protocols used: Hip Pain-A-AH

## 2023-09-19 DIAGNOSIS — M7062 Trochanteric bursitis, left hip: Secondary | ICD-10-CM | POA: Diagnosis not present

## 2023-09-22 ENCOUNTER — Other Ambulatory Visit: Payer: Self-pay | Admitting: Family Medicine

## 2023-09-23 NOTE — Telephone Encounter (Signed)
Appt made for 09/25/23.

## 2023-09-25 ENCOUNTER — Ambulatory Visit (INDEPENDENT_AMBULATORY_CARE_PROVIDER_SITE_OTHER)

## 2023-09-25 DIAGNOSIS — Z780 Asymptomatic menopausal state: Secondary | ICD-10-CM | POA: Diagnosis not present

## 2023-09-26 DIAGNOSIS — M85831 Other specified disorders of bone density and structure, right forearm: Secondary | ICD-10-CM | POA: Diagnosis not present

## 2023-09-26 DIAGNOSIS — Z780 Asymptomatic menopausal state: Secondary | ICD-10-CM | POA: Diagnosis not present

## 2023-10-07 ENCOUNTER — Telehealth: Payer: Self-pay | Admitting: Family Medicine

## 2023-10-07 DIAGNOSIS — R059 Cough, unspecified: Secondary | ICD-10-CM | POA: Diagnosis not present

## 2023-10-07 DIAGNOSIS — R519 Headache, unspecified: Secondary | ICD-10-CM | POA: Diagnosis not present

## 2023-10-07 DIAGNOSIS — R490 Dysphonia: Secondary | ICD-10-CM | POA: Diagnosis not present

## 2023-10-07 NOTE — Telephone Encounter (Signed)
**Note De-identified Hilliary Jock Obfuscation** Please advise 

## 2023-10-07 NOTE — Telephone Encounter (Unsigned)
 Copied from CRM [id number]. Topic: Clinical - Request for Lab/Test Order >> Oct 07, 2023  1:31 PM Felicia Pugh wrote: Reason for CRM: Patient called states she saw ENT Dr. Aleene Davidson and she talked to him about pain side of head and he suggested that she have her pcp put in request for CT scan. Would like to know how to get that set up. Thank You

## 2023-10-08 NOTE — Telephone Encounter (Signed)
 I do not see in his note where he talked about wanting a CT scan, if he would like [place] to have 1 then please have him send me a message saying what diagnosis he would like [place] to put it under and what kind of CT scan he would like.  If not just ask him if they can schedule it

## 2023-10-14 NOTE — Telephone Encounter (Signed)
 Copied from CRM [id number]. Topic: Referral - Request for Referral >> Oct 13, 2023 10:50 AM Maree Krabbe H wrote: Did the patient discuss referral with their provider in the last year? Yes (If No - schedule appointment) (If Yes - send message)  Appointment offered? No  Type of order/referral and detailed reason for visit: Check for her head  Preference of office, provider, location: None  If referral order, have you been seen by this specialty before? No (If Yes, this issue or another issue? When? Where?  Can we respond through MyChart? Yes

## 2023-10-15 ENCOUNTER — Other Ambulatory Visit: Payer: Self-pay

## 2023-10-15 ENCOUNTER — Emergency Department (HOSPITAL_COMMUNITY): Admission: EM | Admit: 2023-10-15 | Discharge: 2023-10-15 | Disposition: A | Discharge status: Home / Self Care

## 2023-10-15 ENCOUNTER — Emergency Department (HOSPITAL_COMMUNITY)

## 2023-10-15 ENCOUNTER — Emergency Department (HOSPITAL_BASED_OUTPATIENT_CLINIC_OR_DEPARTMENT_OTHER)
Admit: 2023-10-15 | Discharge: 2023-10-15 | Disposition: A | Discharge status: Home / Self Care | Attending: Emergency Medicine | Admitting: Emergency Medicine

## 2023-10-15 ENCOUNTER — Encounter (HOSPITAL_COMMUNITY): Payer: Self-pay

## 2023-10-15 DIAGNOSIS — M7989 Other specified soft tissue disorders: Secondary | ICD-10-CM

## 2023-10-15 DIAGNOSIS — D72829 Elevated white blood cell count, unspecified: Secondary | ICD-10-CM | POA: Diagnosis not present

## 2023-10-15 DIAGNOSIS — M1612 Unilateral primary osteoarthritis, left hip: Secondary | ICD-10-CM | POA: Diagnosis not present

## 2023-10-15 DIAGNOSIS — R0789 Other chest pain: Secondary | ICD-10-CM | POA: Diagnosis not present

## 2023-10-15 DIAGNOSIS — I10 Essential (primary) hypertension: Secondary | ICD-10-CM | POA: Diagnosis not present

## 2023-10-15 DIAGNOSIS — R2242 Localized swelling, mass and lump, left lower limb: Secondary | ICD-10-CM | POA: Diagnosis not present

## 2023-10-15 DIAGNOSIS — R079 Chest pain, unspecified: Secondary | ICD-10-CM | POA: Diagnosis not present

## 2023-10-15 DIAGNOSIS — R9389 Abnormal findings on diagnostic imaging of other specified body structures: Secondary | ICD-10-CM | POA: Diagnosis not present

## 2023-10-15 DIAGNOSIS — Z79899 Other long term (current) drug therapy: Secondary | ICD-10-CM | POA: Insufficient documentation

## 2023-10-15 DIAGNOSIS — E119 Type 2 diabetes mellitus without complications: Secondary | ICD-10-CM | POA: Insufficient documentation

## 2023-10-15 DIAGNOSIS — R058 Other specified cough: Secondary | ICD-10-CM | POA: Diagnosis not present

## 2023-10-15 DIAGNOSIS — R600 Localized edema: Secondary | ICD-10-CM | POA: Insufficient documentation

## 2023-10-15 DIAGNOSIS — M7062 Trochanteric bursitis, left hip: Secondary | ICD-10-CM | POA: Diagnosis not present

## 2023-10-15 DIAGNOSIS — Z794 Long term (current) use of insulin: Secondary | ICD-10-CM | POA: Insufficient documentation

## 2023-10-15 DIAGNOSIS — R0602 Shortness of breath: Secondary | ICD-10-CM | POA: Diagnosis not present

## 2023-10-15 DIAGNOSIS — M79605 Pain in left leg: Secondary | ICD-10-CM | POA: Diagnosis not present

## 2023-10-15 LAB — CBG MONITORING, ED
Glucose-Capillary: 34 mg/dL — CL (ref 70–99)
Glucose-Capillary: 105 mg/dL — ABNORMAL HIGH (ref 70–99)

## 2023-10-15 LAB — BASIC METABOLIC PANEL
Anion gap: 11 (ref 5–15)
BUN: 10 mg/dL (ref 8–23)
CO2: 27 mmol/L (ref 22–32)
Calcium: 9.6 mg/dL (ref 8.9–10.3)
Chloride: 106 mmol/L (ref 98–111)
Creatinine, Ser: 0.61 mg/dL (ref 0.44–1.00)
GFR, Estimated: >60 mL/min (ref >60)
Glucose, Bld: 80 mg/dL (ref 70–99)
Potassium: 4 mmol/L (ref 3.5–5.1)
Sodium: 144 mmol/L (ref 135–145)

## 2023-10-15 LAB — CBC WITH DIFFERENTIAL/PLATELET
Abs Immature Granulocytes: 0.04 10*3/uL (ref 0.00–0.07)
Basophils Absolute: 0 10*3/uL (ref 0.0–0.1)
Basophils Relative: 0 %
Eosinophils Absolute: 0.1 10*3/uL (ref 0.0–0.5)
Eosinophils Relative: 1 %
HCT: 39.7 % (ref 36.0–46.0)
Hemoglobin: 12.3 g/dL (ref 12.0–15.0)
Immature Granulocytes: 0 %
Lymphocytes Relative: 18 %
Lymphs Abs: 2 10*3/uL (ref 0.7–4.0)
MCH: 28.3 pg (ref 26.0–34.0)
MCHC: 31 g/dL (ref 30.0–36.0)
MCV: 91.3 fL (ref 80.0–100.0)
Monocytes Absolute: 0.8 10*3/uL (ref 0.1–1.0)
Monocytes Relative: 7 %
Neutro Abs: 8.4 10*3/uL — ABNORMAL HIGH (ref 1.7–7.7)
Neutrophils Relative %: 74 %
Platelets: 232 10*3/uL (ref 150–400)
RBC: 4.35 MIL/uL (ref 3.87–5.11)
RDW: 14.3 % (ref 11.5–15.5)
WBC: 11.3 10*3/uL — ABNORMAL HIGH (ref 4.0–10.5)
nRBC: 0 % (ref 0.0–0.2)

## 2023-10-15 LAB — TROPONIN I (HIGH SENSITIVITY): Troponin I (High Sensitivity): 5 ng/L (ref <18)

## 2023-10-15 LAB — BRAIN NATRIURETIC PEPTIDE: B Natriuretic Peptide: 96 pg/mL (ref 0.0–100.0)

## 2023-10-15 NOTE — Progress Notes (Signed)
 Left lower extremity venous duplex has been completed. Preliminary results can be found in CV Proc through chart review.  Results were given to Dr. Fredderick Phenix.  10/15/23 1:25 PM Olen Cordial RVT

## 2023-10-15 NOTE — ED Notes (Signed)
 MD notified of BGL. Soda and graham crackers given. Pt remained alert and oriented and able to coordinate swallowing.

## 2023-10-15 NOTE — ED Triage Notes (Signed)
 Pt c/o LLE swelling and pain since last Spring and L hip pain radiating down L leg x 6 months.  Pain score 10/10.  Pt reports having a "shot" in her hip x2-3 months ago w/o relief.

## 2023-10-15 NOTE — ED Provider Triage Note (Signed)
 Emergency Medicine Provider Triage Evaluation Note  Felicia Pugh , a 78 y.o. female  was evaluated in triage.  Pt complains of swelling of left leg, sent here for u/s.  This been going on for several months..  Has had some associated shortness of breath  Review of Systems  Positive: Leg swelling, shortness of breath Negative: Fever  Physical Exam  There were no vitals taken for this visit. Gen:   Awake, no distress    Resp:  Normal effort   MSK:   Moves extremities without difficulty   Other:     Medical Decision Making  Medically screening exam initiated at 11:15 AM.  Appropriate orders placed.  Trevor Iha was informed that the remainder of the evaluation will be completed by another provider, this initial triage assessment does not replace that evaluation, and the importance of remaining in the ED until their evaluation is complete.      Rolan Bucco, MD 10/15/23 [phone]

## 2023-10-15 NOTE — ED Provider Notes (Signed)
 [place] EM[facility]GENC[facility] DEPARTMENT AT [facility] Provider Note   CSN: [id number] Arrival date & time: 10/15/23  1109     History  Chief Complaint  Patient presents with   Leg Swelling   Hip Pain    Felicia Pugh is a 78 y.o. female.   Hip Pain Associated symptoms include chest pain.  Patient is a 78 year old female presents the ED today complaining of left-sided hip pain that has been present for the last 6 months but has been accompanied with left leg swelling x 3 weeks.  Previous medical history of type 2 diabetes, HTN, pulmonary nodule, Crohn's, back surgery.  Was seen by orthopedics today and was told to come to the ED for a DVT rule out of that leg.  Had also reported intermittent chest pain with shortness of breath that has been treated multiple times for possible underlying pneumonia with prednisone and antibiotics.  Denies fever, vision changes, sore throat, abdominal pain, nausea, vomiting, diarrhea, lower extremity weakness.      Home Medications Prior to Admission medications   Medication Sig Start Date End Date Taking? Authorizing Provider  albuterol (VENTOLIN HFA) 108 (90 Base) MCG/ACT inhaler Inhale 2 puffs into the lungs every 6 (six) hours as needed for wheezing or shortness of breath. 04/30/23   Milian, Aleen Campi, FNP  Apoaequorin (PREVAGEN) 10 MG CAPS Take by mouth.    [provider]  atorvastatin (LIPITOR) 40 MG tablet Take 1 tablet (40 mg total) by mouth daily. 10/18/22   Dettinger, Elige Radon, MD  balsalazide (COLAZAL) 750 MG capsule TAKE 2 CAPSULES BY MOUTH IN THE MORNING AND AT BEDTIME 07/07/23   Tiffany Kocher, PA-C  [place]-Vitamin C-Zinc [facility] IMMUNE HEALTH GUMMY PO) Take by mouth.    [provider]  Empagliflozin-linaGLIPtin (GLYXAMBI) 25-5 MG TABS Take 1 tablet by mouth daily. 02/26/23   Dettinger, Elige Radon, MD  fluticasone (FLONASE) 50 MCG/ACT nasal spray Place 1 spray into both nostrils 2 (two) times daily as  needed for allergies or rhinitis. 08/29/23   Dettinger, Elige Radon, MD  glucose blood (ONETOUCH VERIO) test strip CHECK GLUCOSE TWICE DAILY Dx E11.69 05/27/23   Dettinger, Elige Radon, MD  insulin degludec (TRESIBA FLEXTOUCH) 100 UNIT/ML FlexTouch Pen INJECT 40 UNITS SUBCUTANEOUSLY ONCE DAILY 02/26/23   Dettinger, Elige Radon, MD  Insulin Pen Needle (PEN NEEDLES) 31G X 5 MM MISC Use to give insulin with flexpen 02/26/23   Dettinger, Elige Radon, MD  Insulin Syringe-Needle U-100 31G X 1/4" 1 ML MISC 1 Syringe by Does not apply route 2 (two) times daily. 02/26/23   Dettinger, Elige Radon, MD  loperamide (IMODIUM) 2 MG capsule Take 2 mg by mouth as needed for diarrhea or loose stools.    [provider]  loratadine (CLARITIN) 10 MG tablet Take 1 tablet (10 mg total) by mouth daily. 08/29/23   Dettinger, Elige Radon, MD  losartan (COZAAR) 25 MG tablet Take 1 tablet (25 mg total) by mouth daily. 02/26/23   Dettinger, Elige Radon, MD  Multiple Vitamins-Minerals (WOMENS 50+ MULTI VITAMIN/MIN PO) Take by mouth.    [provider]  nystatin (MYCOSTATIN/NYSTOP) powder APPLY POWDER TO AFFECTED AREA 3 TIMES DAILY 09/22/23   Dettinger, Elige Radon, MD      Allergies    Metformin and related, Asa [aspirin], Nsaids, Other, and Tolmetin    Review of Systems   Review of Systems  Respiratory:  Positive for cough.   Cardiovascular:  Positive for chest pain and leg swelling.  Musculoskeletal:  Positive for arthralgias.  All other systems reviewed and are negative.   Physical Exam Updated Vital Signs BP 116/65   Pulse 85   Temp 98.2 F (36.8 C) (Oral)   Resp (!) 25   Ht 5\' 2"  (1.575 m)   Wt 74.8 kg   SpO2 93%   BMI 30.18 kg/m  Physical Exam Vitals and nursing note reviewed.  Constitutional:      General: She is not in acute distress.    Appearance: Normal appearance. She is not ill-appearing.  HENT:     Head: Normocephalic and atraumatic.     Nose: Nose normal. No congestion or rhinorrhea.      Mouth/Throat:     Mouth: Mucous membranes are moist.     Pharynx: Oropharynx is clear. No oropharyngeal exudate or posterior oropharyngeal erythema.  Eyes:     General:        Right eye: No discharge.        Left eye: No discharge.     Extraocular Movements: Extraocular movements intact.     Conjunctiva/sclera: Conjunctivae normal.  Cardiovascular:     Rate and Rhythm: Normal rate and regular rhythm.     Pulses: Normal pulses.     Heart sounds: Normal heart sounds. No murmur heard.    No friction rub. No gallop.     Comments: DP pulse 2+ bilaterally Pulmonary:     Effort: Pulmonary effort is normal. No respiratory distress.     Breath sounds: Normal breath sounds. No stridor. No wheezing, rhonchi or rales.  Chest:     Chest wall: No tenderness.  Abdominal:     General: Abdomen is flat.     Palpations: Abdomen is soft.     Tenderness: There is no abdominal tenderness. There is no right CVA tenderness or left CVA tenderness.  Musculoskeletal:        General: Tenderness (Calf tenderness noted to left lower extremity) present. No deformity.     Cervical back: Normal range of motion.     Right lower leg: No edema.     Left lower leg: Edema present.  Skin:    General: Skin is warm and dry.     Capillary Refill: Capillary refill takes less than 2 seconds.  Neurological:     General: No focal deficit present.     Mental Status: She is alert and oriented to person, place, and time. Mental status is at baseline.     Sensory: No sensory deficit.     Motor: No weakness.  Psychiatric:        Mood and Affect: Mood normal.     ED Results / Procedures / Treatments   Labs (all labs ordered are listed, but only abnormal results are displayed) Labs Reviewed  CBC WITH DIFFERENTIAL/PLATELET - Abnormal; Notable for the following components:      Result Value   WBC 11.3 (*)    Neutro Abs 8.4 (*)    All other components within normal limits  CBG MONITORING, ED - Abnormal; Notable for the  following components:   Glucose-Capillary 34 (*)    All other components within normal limits  CBG MONITORING, ED - Abnormal; Notable for the following components:   Glucose-Capillary 105 (*)    All other components within normal limits  BASIC METABOLIC PANEL  BRAIN NATRIURETIC PEPTIDE  TROPONIN I (HIGH SENSITIVITY)    EKG None  Radiology VAS [place] LOWER EXTREMITY VENOUS (DVT) (7a-7p) Result Date: 10/15/2023  Lower Venous DVT Study  Patient Name:  DAWNETTE MIONE  Date of Exam:   10/15/2023 Medical Rec #: [id number]       Accession #:    [id number] Date of Birth: 1945-11-02       Patient Gender: F Patient Age:   1 years Exam Location:  [facility] Procedure:      VAS [place] LOWER EXTREMITY VENOUS (DVT) Referring Phys: MELANIE BELFI --------------------------------------------------------------------------------  Indications: Swelling.  Risk Factors: None identified. Comparison Study: No prior studies. Performing Technologist: Chanda Busing RVT  Examination Guidelines: A complete evaluation includes B-mode imaging, spectral Doppler, color Doppler, and power Doppler as needed of all accessible portions of each vessel. Bilateral testing is considered an integral part of a complete examination. Limited examinations for reoccurring indications may be performed as noted. The reflux portion of the exam is performed with the patient in reverse Trendelenburg.  +-----+---------------+---------+-----------+----------+--------------+ RIGHTCompressibilityPhasicitySpontaneityPropertiesThrombus Aging +-----+---------------+---------+-----------+----------+--------------+ CFV  Full           Yes      Yes                                 +-----+---------------+---------+-----------+----------+--------------+   +---------+---------------+---------+-----------+----------+--------------+ LEFT     CompressibilityPhasicitySpontaneityPropertiesThrombus Aging  +---------+---------------+---------+-----------+----------+--------------+ CFV      Full           Yes      Yes                                 +---------+---------------+---------+-----------+----------+--------------+ SFJ      Full                                                        +---------+---------------+---------+-----------+----------+--------------+ FV Prox  Full                                                        +---------+---------------+---------+-----------+----------+--------------+ FV Mid   Full                                                        +---------+---------------+---------+-----------+----------+--------------+ FV DistalFull                                                        +---------+---------------+---------+-----------+----------+--------------+ PFV      Full                                                        +---------+---------------+---------+-----------+----------+--------------+ POP      Full           Yes  Yes                                 +---------+---------------+---------+-----------+----------+--------------+ PTV      Full                                                        +---------+---------------+---------+-----------+----------+--------------+ PERO     Full                                                        +---------+---------------+---------+-----------+----------+--------------+     Summary: RIGHT: - No evidence of common femoral vein obstruction.   LEFT: - There is no evidence of deep vein thrombosis in the lower extremity.  - No cystic structure found in the popliteal fossa.  *See table(s) above for measurements and observations.    Preliminary    DG Chest 2 View Result Date: 10/15/2023 CLINICAL DATA:  Shortness of breath, productive cough. EXAM: CHEST - 2 VIEW COMPARISON:  April 30, 2023. FINDINGS: Stable cardiomediastinal silhouette. Elevated left hemidiaphragm.  Both lungs are clear. The visualized skeletal structures are unremarkable. IMPRESSION: No active cardiopulmonary disease. Electronically Signed   By: Lupita Raider M.D.   On: 10/15/2023 14:07    Procedures Procedures    Medications Ordered in ED Medications - No data to display           HEART Score: 5                    Medical Decision Making  Patient is a 78 year old female presents the ED today complaining of left-sided hip pain that has been present for the last 6 months but has been accompanied with left leg swelling x 3 weeks.  Previous medical history of type 2 diabetes, HTN, pulmonary nodule, Crohn's, back surgery.  Was seen by orthopedics today and was told to come to the ED for a DVT rule out of that leg.  Had also reported intermittent chest pain with shortness of breath that has been treated multiple times for possible underlying pneumonia with prednisone and antibiotics over the last year.  On exam, patient is notably tender to palpation in the left hip joint space.  Patient also really notably having left lower leg extremity with some calf pain to palpation.  Negative Homans' sign.  But is otherwise unremarkable physical exam.  LCTAB, normal vital signs, alert and oriented x 4.  Patient was noted to have initial blood close of 34, patient was given crackers and repeat CBG was noted 105.  Labs were otherwise unremarkable outside of an isolated elevated white count of 11.3 which was notably down from 1 month ago 11.9.  DVT was negative, chest x-ray unremarkable.  Low suspicion for any emergent process present at this time including low suspicion for PE, ACS, aortic dissection, pneumonia.  However due to patient's chronic symptoms recommended that she follow with PCP for possible pulmonology testing due to her having had multiple treatments with antibiotics and prednisone for chronic cough.  Also recommended that she follow-up with cardiology to her heart score being 5 despite benign  workup and ECG today.  Provided strict return to ED precautions.  Patient vital signs remained stable at the course of her time here.  She also has a already scheduled follow-up with Ortho for her left hip.  I believe this patient is here to be discharged at this time.  Differential diagnoses prior to evaluation: The emergent differential diagnosis includes, but is not limited to, DVT, ACS, PE, heart failure, pneumonia, Boerhaave's, cellulitis, venous stasis, ischemic limb. This is not an exhaustive differential.   Past Medical History / Co-morbidities / Social History: Anemia, HTN, type 2 diabetes, Crohn's, GERD, pulmonary nodule, hyperlipidemia  Additional history: Chart reviewed. Pertinent results include:   Seen by ENT on 10/07/2023 for hoarseness, considered to be allergic in relation with patient continuing cetirizine, continuing fluticasone, started on omeprazole.  Lab Tests/Imaging studies: I personally interpreted labs/imaging and the pertinent results include:   CBC is notable for an elevated white count of 11.3 which is notably down from 1 month ago which was 11.9 BNP was 96  BMP unremarkable Troponin and delta troponin unremarkable CBG was initially 35 with crackers repeat CBG was noted to be 105. DVT study of left leg was unremarkable Chest x-ray unremarkable I agree with the radiologist interpretation.  Cardiac monitoring: EKG obtained and interpreted by myself and attending physician which shows: NSR .muse   Medications: No medications required for this visit.  I have reviewed the patients home medicines and have made adjustments as needed.    Disposition: After consideration of the diagnostic results and the patients response to treatment, I feel that the patient benefit from discharge and treatment as above.   emergency department workup does not suggest an emergent condition requiring admission or immediate intervention beyond what has been performed at this time.  The plan is: Monitor symptoms at home, urgent cardiology follow-up,Follow-up with orthopedics, follow-up with PCP, turn for any new or worsening symptoms. The patient is safe for discharge and has been instructed to return immediately for worsening symptoms, change in symptoms or any other concerns.  Final Clinical Impression(s) / ED Diagnoses Final diagnoses:  Left leg swelling  Chest pain, unspecified type    Rx / DC Orders ED Discharge Orders          Ordered    Ambulatory referral to Cardiology       Comments: If you have not heard from the Cardiology office within the next 72 hours please call [phone].   10/15/23 1542              Lunette Stands, PA-C 10/15/23 1556    Coral Spikes, [place] 10/16/23 1555

## 2023-10-15 NOTE — Discharge Instructions (Addendum)
 You were seen today for left hip pain and DVT rule out.  However due to complaints of chronic chest pain, also worked you up for possible underlying heart issues.  All of which were very reassuring today with low suspicion for any emergent process happening at this time.  However due to your symptoms being persistent recommend that you follow-up with your PCP to get scheduled for a pulmonology visit, as well as follow-up with Ortho again for your hip pain, and I have also sent in a referral to cardiology for you to see them due to your age and symptoms.  Return to the ED if you begin to experience any new or worsening symptoms including worsening shortness of breath when walking, worsening chest pain when walking, fever, confusion, increased pain.

## 2023-10-16 ENCOUNTER — Other Ambulatory Visit (HOSPITAL_COMMUNITY): Payer: Self-pay | Admitting: Physician Assistant

## 2023-10-16 ENCOUNTER — Other Ambulatory Visit: Payer: Self-pay | Admitting: Student in an Organized Health Care Education/Training Program

## 2023-10-16 DIAGNOSIS — M7062 Trochanteric bursitis, left hip: Secondary | ICD-10-CM

## 2023-10-16 DIAGNOSIS — M1612 Unilateral primary osteoarthritis, left hip: Secondary | ICD-10-CM

## 2023-10-16 DIAGNOSIS — R519 Headache, unspecified: Secondary | ICD-10-CM

## 2023-10-20 ENCOUNTER — Encounter: Payer: Self-pay | Admitting: Nurse Practitioner

## 2023-10-20 ENCOUNTER — Ambulatory Visit: Attending: Nurse Practitioner | Admitting: Nurse Practitioner

## 2023-10-20 VITALS — BP 142/80 | HR 83 | Ht 62.0 in | Wt 169.6 lb

## 2023-10-20 DIAGNOSIS — I700 Atherosclerosis of aorta: Secondary | ICD-10-CM | POA: Diagnosis not present

## 2023-10-20 DIAGNOSIS — R911 Solitary pulmonary nodule: Secondary | ICD-10-CM | POA: Diagnosis not present

## 2023-10-20 DIAGNOSIS — E785 Hyperlipidemia, unspecified: Secondary | ICD-10-CM | POA: Diagnosis not present

## 2023-10-20 DIAGNOSIS — I872 Venous insufficiency (chronic) (peripheral): Secondary | ICD-10-CM

## 2023-10-20 DIAGNOSIS — I10 Essential (primary) hypertension: Secondary | ICD-10-CM

## 2023-10-20 DIAGNOSIS — E1169 Type 2 diabetes mellitus with other specified complication: Secondary | ICD-10-CM

## 2023-10-20 DIAGNOSIS — Z794 Long term (current) use of insulin: Secondary | ICD-10-CM

## 2023-10-20 DIAGNOSIS — R0602 Shortness of breath: Secondary | ICD-10-CM

## 2023-10-20 DIAGNOSIS — R079 Chest pain, unspecified: Secondary | ICD-10-CM | POA: Diagnosis not present

## 2023-10-20 MED ORDER — LOSARTAN POTASSIUM 50 MG PO TABS: 50.0000 mg | ORAL_TABLET | Freq: Every day | ORAL | 3 refills | Status: DC

## 2023-10-20 NOTE — Progress Notes (Unsigned)
 Office Visit    Patient Name: Felicia Pugh Date of Encounter: 10/20/2023  Primary Care Provider:  Dettinger, Elige Radon, MD Primary Cardiologist:  Chrystie Nose, MD  Chief Complaint    78 year old female with a history of aortic atherosclerosis, hypertension, hyperlipidemia, venous reflux, pulmonary nodule, type 2 diabetes, Crohn's disease, anemia and GERD who presents for new patient visit and for evaluation of chest pain and shortness of breath.   Past Medical History    Past Medical History:  Diagnosis Date   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 Cleone Slim, no longer seeing anyone (02/2015)   Cataract    Complication of anesthesia    difficulty breathing after waking up from anesthesia   Crohn's disease (HCC) 2016   diagnosed in 2016 and started on Colazal, but inflammation/ulceration in ileum/cecum dates back to 2006.   Diabetes mellitus without complication (HCC)    Diabetic neuropathy (HCC)    Hyperlipidemia    Hypertension    IBS (irritable bowel syndrome)    Stroke (HCC) 08/12/2009   Syncope and collapse    Thyroid nodule    Ulcer    Past Surgical History:  Procedure Laterality Date   ABDOMINAL HYSTERECTOMY     partial   BACK SURGERY     x3 (Dr Alben Deeds, Dr Noel Gerold)   BACK SURGERY  03/2022   BREAST SURGERY Right 2002   cyst removed   COLONOSCOPY  2006   RMR: Normal colon except for ileocecal valve, eroded/ulcerated areas at ileocecal valve with mucosal hemorrhage present status post biopsy. ulcer (ileocecal valve, biopsy): Chronic active inflammation with ulceration, nonspecific.   COLONOSCOPY  09/06/2009   RMR: Suboptimal prep on the right side. Ulcers noted at IC valve, base of cecum, and scattered through food part of ascending colon. Pathology wiht chronic active colitis, question Crohn's   COLONOSCOPY N/A 03/27/2015   Dr. Jena Gauss: abnormal cecum and IC valve most consistent with IBD. TI intubated and appeared normal. Likely Crohn's disease.  Patient denies NSAID use. No improvement with Lialda samples at time of initial diagnosis   COLONOSCOPY N/A 05/15/2017   Dr. Jena Gauss: ab normal IC valve, s/p biopsy with active ileitis, diverticulosis in sigmoid and descending colon. Non-bleeding internal hemorrhoids   ESOPHAGOGASTRODUODENOSCOPY     HAND SURGERY Right    Carpel tunnel and right elbow   LAPAROSCOPIC APPENDECTOMY N/A 07/21/2016   Procedure: APPENDECTOMY LAPAROSCOPIC;  Surgeon: Franky Macho, MD;  Location: [facility];  Service: General;  Laterality: N/A;   Small bowel capsule endoscopy  06/10/2005   RMR: single erosion versus AVM, distal ileum   Small bowel capsule endoscopy  prior to 05/2005   [place]: reported erosions/ulcerations per medical record. actual report unavailable.    Allergies  Allergies  Allergen Reactions   Metformin And Related     Severe diarrhea   Asa [Aspirin]     GI Dr does not want her to take due to hx ulcers   Nsaids Other (See Comments)    GI Dr does not want her to take due to hx of ulcers Instructed by GI not to take    Other Diarrhea    Steroids   Tolmetin Other (See Comments)    GI Dr does not want her to take due to hx of ulcers GI Dr does not want her to take due to hx of ulcers      Labs/Other Studies Reviewed    The following studies were reviewed today:  Cardiac  Studies & Procedures   ______________________________________________________________________________________________     ECHOCARDIOGRAM  ECHOCARDIOGRAM COMPLETE 07/23/2016  Narrative *[facility]* [address] [place], [place] [address] [phone]  ------------------------------------------------------------------- Transthoracic Echocardiography  Patient:    Felicia Pugh MR #:       [id number] Study Date: 07/23/2016 Gender:     F Age:        70 Height:     157.5 cm Weight:     85.5 kg BSA:        1.97 m^2 Pt. Status: Room:       A316  ATTENDING    Jennelle Human [id number] Ignacia Palma, Ramon Dredge [id number] ADMITTING    Franky Macho A. SONOGRAPHER  Jeryl Columbia PERFORMING   [facility], Jeani Hawking  cc:  ------------------------------------------------------------------- LV EF: 65% -   70%  ------------------------------------------------------------------- Indications:      Dyspnea 786.09.  ------------------------------------------------------------------- History:   Risk factors:  Hypertension. Diabetes mellitus. Dyslipidemia.  ------------------------------------------------------------------- Study Conclusions  - Left ventricle: The cavity size was normal. Wall thickness was normal. Systolic function was vigorous. The estimated ejection fraction was in the range of 65% to 70%. Wall motion was normal; there were no regional wall motion abnormalities. Doppler parameters are consistent with abnormal left ventricular relaxation (grade 1 diastolic dysfunction). - Aortic valve: Mildly calcified annulus. Trileaflet; mildly thickened leaflets. Valve area (VTI): 3 cm^2. Valve area (Vmax): 2.48 cm^2. - Systemic veins: IVC is small, suggesting low RA pressure and hypovolemia. - Technically adequate study.  ------------------------------------------------------------------- Study data:  No prior study was available for comparison.  Study status:  Routine.  Procedure:  Transthoracic echocardiography. Image quality was adequate.          Transthoracic echocardiography.  M-mode, complete 2D, spectral Doppler, and color Doppler.  Birthdate:  Patient birthdate: 1946/03/01.  Age:  Patient is 79 yr old.  Sex:  Gender: female.    BMI: 34.5 kg/m^2.  Blood pressure:     123/69  Patient status:  Inpatient.  Study date: Study date: 07/23/2016. Study time: 10:21 AM.  Location:  Bedside.  -------------------------------------------------------------------  ------------------------------------------------------------------- Left ventricle:  The  cavity size was normal. Wall thickness was normal. Systolic function was vigorous. The estimated ejection fraction was in the range of 65% to 70%. Wall motion was normal; there were no regional wall motion abnormalities. Doppler parameters are consistent with abnormal left ventricular relaxation (grade 1 diastolic dysfunction). LA pressure is indeterminate.  ------------------------------------------------------------------- Aortic valve:   Mildly calcified annulus. Trileaflet; mildly thickened leaflets.  Doppler:   There was no stenosis.   There was no significant regurgitation.    VTI ratio of LVOT to aortic valve: 1.06. Valve area (VTI): 3 cm^2. Indexed valve area (VTI): 1.52 cm^2/m^2. Peak velocity ratio of LVOT to aortic valve: 0.87. Valve area (Vmax): 2.48 cm^2. Indexed valve area (Vmax): 1.25 cm^2/m^2. Mean gradient (S): 5 mm Hg. Peak gradient (S): 11 mm Hg.  ------------------------------------------------------------------- Aorta:  Aortic root: The aortic root was normal in size.  ------------------------------------------------------------------- Mitral valve:   Normal thickness leaflets .  Doppler:   There was no evidence for stenosis.   There was no significant regurgitation. Peak gradient (D): 2 mm Hg.  ------------------------------------------------------------------- Left atrium:  The atrium was normal in size.  ------------------------------------------------------------------- Atrial septum:  No defect or patent foramen ovale was identified.  ------------------------------------------------------------------- Right ventricle:  The cavity size was normal. Wall thickness was normal. Systolic function was normal.  -------------------------------------------------------------------  Pulmonic valve:   Not well visualized.  Doppler:   There was no evidence for stenosis.   There was no significant  regurgitation.  ------------------------------------------------------------------- Tricuspid valve:   Normal thickness leaflets.  Doppler:   There was no evidence for stenosis.   There was no significant regurgitation.  ------------------------------------------------------------------- Pulmonary artery:    Systolic pressure could not be accurately estimated.   Inadequate TR jet.  ------------------------------------------------------------------- Right atrium:  The atrium was normal in size.  ------------------------------------------------------------------- Pericardium:  There was no pericardial effusion.  ------------------------------------------------------------------- Systemic veins:  IVC is small, suggesting low RA pressure and hypovolemia.  ------------------------------------------------------------------- Measurements  Left ventricle                           Value           Reference LV ID, ED, PLAX chordal          (L)     39     mm       43 - 52 LV ID, ES, PLAX chordal          (L)     19.5   mm       23 - 38 LV PW thickness, ED                      9      mm       --------- IVS/LV PW ratio, ED                      1.08            <=1.3  Ventricular septum                       Value           Reference IVS thickness, ED                        9.7    mm       ---------  LVOT                                     Value           Reference LVOT ID, S                               19     mm       --------- LVOT area                                2.84   cm^2     --------- LVOT peak velocity, S                    144.2  cm/s     --------- LVOT VTI, S                              24.73  cm       --------- LVOT peak gradient, S  8      mm Hg    ---------  Aortic valve                             Value           Reference Aortic valve peak velocity, S            165.19 cm/s     --------- Aortic valve mean velocity, S            100.58 cm/s      --------- Aortic valve VTI, S                      23.37  cm       --------- Aortic mean gradient, S                  5      mm Hg    --------- Aortic peak gradient, S                  11     mm Hg    --------- VTI ratio, LVOT/AV                       1.06            --------- Aortic valve area, VTI                   3      cm^2     --------- Aortic valve area/bsa, VTI               1.52   cm^2/m^2 --------- Velocity ratio, peak, LVOT/AV            0.87            --------- Aortic valve area, peak velocity         2.48   cm^2     --------- Aortic valve area/bsa, peak              1.25   cm^2/m^2 --------- velocity  Aorta                                    Value           Reference Aortic root ID, ED                       32     mm       ---------  Left atrium                              Value           Reference LA ID, A-P, ES                           25     mm       --------- LA volume/bsa, S                         24.5   ml/m^2   ---------  Mitral valve  Value           Reference Mitral E-wave peak velocity              71.3   cm/s     --------- Mitral A-wave peak velocity              114    cm/s     --------- Mitral deceleration time         (H)     254    ms       150 - 230 Mitral peak gradient, D                  2      mm Hg    --------- Mitral E/A ratio, peak                   0.6             ---------  Right ventricle                          Value           Reference TAPSE                                    24.4   mm       --------- RV s&', lateral, S                        17.3   cm/s     ---------  Legend: (L)  and  (H)  mark values outside specified reference range.  ------------------------------------------------------------------- Prepared and Electronically Authenticated by  Patrick Jupiter, M.D. 2017-12-12T12:12:42          ______________________________________________________________________________________________      Recent Labs: 05/21/2023: TSH 1.14 08/18/2023: ALT 16 10/15/2023: B Natriuretic Peptide 96.0; BUN 10; Creatinine, Ser 0.61; Hemoglobin 12.3; Platelets 232; Potassium 4.0; Sodium 144  Recent Lipid Panel    Component Value Date/Time   CHOL 135 10/18/2022 0930   CHOL 181 02/09/2013 1001   TRIG 44 10/18/2022 0930   TRIG 42 05/27/2014 0950   TRIG 58 02/09/2013 1001   HDL 67 10/18/2022 0930   HDL 69 05/27/2014 0950   HDL 62 02/09/2013 1001   CHOLHDL 2.0 10/18/2022 0930   LDLCALC 58 10/18/2022 0930   LDLCALC 67 05/27/2014 0950   LDLCALC 107 (H) 02/09/2013 1001    History of Present Illness   78 year old female with the above past medical history including aortic atherosclerosis, hypertension, hyperlipidemia, venous reflux, pulmonary nodule,  type 2 diabetes, Crohn's disease, anemia and GERD.   Echocardiogram in 2017 showed EF 65 to 70%, no RWMA, G1 DD, no significant valvular abnormalities.  Lower extremity venous reflux study in 2023 showed evidence of venous reflux.  She presented to the ED on 10/15/2023 with a 97-month history of left-sided hip pain, 3-week history of left leg swelling, intermittent chest pain and shortness of breath.  Prior to her ED visit she had been treated for bronchitis, possible pneumonia per PCP.  In the ED, LE venous duplex was negative for DVT.  Chest x-ray was unremarkable.  EKG was without acute changes.  Troponin was negative x 2.  CBG was 35 upon arrival to the ED, improved to 100 with treatment.  She was referred to cardiology.  She presents today  for a new patient visit for further evaluation of chest pain and shortness of breath.  She notes a history of stroke in her mother, her father had a heart attack in his 11s.  Over the past 3 to 4 months she has noticed intermittent tightness in her chest as well as shortness of breath both at rest and with activity.  She also reports intermittent dizziness.  She denies any palpitations, presyncope, syncope, PND, orthopnea,or  weight gain.  Her BP has been elevated.  She has intermittent numbness in her left hand, this has been ongoing for some time.  She also reports frequent headaches on the right side of her head.  She is pending a CT scan for further evaluation.    Home Medications    Current Outpatient Medications  Medication Sig Dispense Refill   albuterol (VENTOLIN HFA) 108 (90 Base) MCG/ACT inhaler Inhale 2 puffs into the lungs every 6 (six) hours as needed for wheezing or shortness of breath. 8 g 2   Apoaequorin (PREVAGEN) 10 MG CAPS Take by mouth.     atorvastatin (LIPITOR) 40 MG tablet Take 1 tablet (40 mg total) by mouth daily. 90 tablet 3   balsalazide (COLAZAL) 750 MG capsule TAKE 2 CAPSULES BY MOUTH IN THE MORNING AND AT BEDTIME 360 capsule 1   Elderberry-Vitamin C-Zinc (ELDERBERRY IMMUNE HEALTH GUMMY PO) Take by mouth.     Empagliflozin-linaGLIPtin (GLYXAMBI) 25-5 MG TABS Take 1 tablet by mouth daily. 90 tablet 3   fluticasone (FLONASE) 50 MCG/ACT nasal spray Place 1 spray into both nostrils 2 (two) times daily as needed for allergies or rhinitis. 16 g 6   glucose blood (ONETOUCH VERIO) test strip CHECK GLUCOSE TWICE DAILY Dx E11.69 200 each 3   insulin degludec (TRESIBA FLEXTOUCH) 100 UNIT/ML FlexTouch Pen INJECT 40 UNITS SUBCUTANEOUSLY ONCE DAILY 30 mL 3   Insulin Pen Needle (PEN NEEDLES) 31G X 5 MM MISC Use to give insulin with flexpen 100 each 1   Insulin Syringe-Needle U-100 31G X 1/4" 1 ML MISC 1 Syringe by Does not apply route 2 (two) times daily. 60 each 6   loperamide (IMODIUM) 2 MG capsule Take 2 mg by mouth as needed for diarrhea or loose stools.     loratadine (CLARITIN) 10 MG tablet Take 1 tablet (10 mg total) by mouth daily. 30 tablet 11   Multiple Vitamins-Minerals (WOMENS 50+ MULTI VITAMIN/MIN PO) Take by mouth.     nystatin (MYCOSTATIN/NYSTOP) powder APPLY POWDER TO AFFECTED AREA 3 TIMES DAILY 60 g 0   omeprazole (PRILOSEC) 40 MG capsule Take 40 mg by mouth daily.     losartan  (COZAAR) 50 MG tablet Take 1 tablet (50 mg total) by mouth daily. 90 tablet 3   No current facility-administered medications for this visit.     Review of Systems    She denies palpitations, pnd, orthopnea, n, v, syncope, edema, weight gain, or early satiety. All other systems reviewed and are otherwise negative except as noted above.   Physical Exam    VS:  BP (!) 142/80   Pulse 83   Ht 5\' 2"  (1.575 m)   Wt 169 lb 9.6 oz (76.9 kg)   SpO2 92%   BMI 31.02 kg/m   GEN: Well nourished, well developed, in no acute distress. HEENT: normal. Neck: Supple, no JVD, carotid bruits, or masses. Cardiac: RRR, no murmurs, rubs, or gallops. No clubbing, cyanosis, edema.  Radials/DP/PT 2+ and equal bilaterally.  Respiratory:  Respirations regular and unlabored, clear  to auscultation bilaterally. GI: Soft, nontender, nondistended, BS + x 4. MS: no deformity or atrophy. Skin: warm and dry, no rash. Neuro:  Strength and sensation are intact. Psych: Normal affect.  Accessory Clinical Findings    ECG personally reviewed by me today - EKG Interpretation Date/Time:  Monday October 20 2023 09:04:03 EDT Ventricular Rate:  83 PR Interval:  150 QRS Duration:  92 QT Interval:  374 QTC Calculation: 439 R Axis:   45  Text Interpretation: Normal sinus rhythm Normal ECG When compared with ECG of 15-Oct-2023 11:32, ST no longer depressed in Inferior leads T wave inversion no longer evident in Inferior leads Nonspecific T wave abnormality now evident in Lateral leads Confirmed by Bernadene Person ([id number]) on 10/20/2023 9:36:30 AM  - no acute changes.   Lab Results  Component Value Date   WBC 11.3 (H) 10/15/2023   HGB 12.3 10/15/2023   HCT 39.7 10/15/2023   MCV 91.3 10/15/2023   PLT 232 10/15/2023   Lab Results  Component Value Date   CREATININE 0.61 10/15/2023   BUN 10 10/15/2023   NA 144 10/15/2023   K 4.0 10/15/2023   CL 106 10/15/2023   CO2 27 10/15/2023   Lab Results  Component Value Date   ALT  16 08/18/2023   AST 23 08/18/2023   ALKPHOS 137 (H) 08/18/2023   BILITOT 0.4 08/18/2023   Lab Results  Component Value Date   CHOL 135 10/18/2022   HDL 67 10/18/2022   LDLCALC 58 10/18/2022   TRIG 44 10/18/2022   CHOLHDL 2.0 10/18/2022    Lab Results  Component Value Date   HGBA1C 8.0 (H) 08/29/2023    Assessment & Plan    1. Chest pain/shortness of breath/dizziness: Echocardiogram in 2017 showed EF 65 to 70%, no RWMA, G1 DD, no significant valvular abnormalities.  She has not had a formal ischemic evaluation that I can see. She reports a 3 to 57-month history of intermittent chest tightness lasting seconds to minutes as well as shortness of breath both at rest and with activity.  She also notes intermittent dizziness and frequent headaches.  She is pending a CT of her head.  She denies palpitations, presyncope, or syncope.  Recent ED workup reassuring, EKG without acute changes, troponin negative x 2.  Euvolemic and well compensated on exam. Discussed further evaluation with possible coronary CT angiogram, echocardiogram. However, patient wishes to defer any additional testing at this time.  Contoinue to monitor symptoms, reviewed ED precautions.  2. Aortic atherosclerosis: Noted on prior CT.  Continue Lipitor.  3. Hypertension: BP has been elevated, and is elevated in office today.  Will increase losartan to 50 mg daily.  Continue to monitor BP report BP consistently greater than 130/80.    4. Hyperlipidemia: LDL was 58 in 10/2022.  She is due for repeat fasting labs with her PCP in April.  Continue Lipitor.  5. Lower extremity swelling/venous reflux:  Lower extremity venous reflux study in 2023 showed evidence of venous reflux.  She reports recent right lower extremity edema.  Not appreciated on exam today.  Recent lower extremity venous duplex was negative for DVT.  Euvolemic and well compensated on exam. Encouraged compression, elevation.  Pending MRI right hip.  6. History of  pulmonary nodule: Stable, monitored per PCP.  7. Type 2 diabetes: A1c was 8.0 in 08/2023.  Monitor managed per PCP.  8. Crohn's disease: Following with GI.  9. Disposition: Pt lives in [place], she is asking about other office  locations including [place], which would be closer to her.  She is a widow and has to rely on her son for transportation.  Will discuss  with Dr. Rennis Golden transitioning to [facility] office MD. In the meantime, will have her follow-up in 3 to 4 months with [facility].  HYPERTENSION CONTROL Vitals:   10/20/23 0858 10/20/23 1005  BP: (!) 156/80 (!) 142/80    The patient's blood pressure is elevated above target today.  In order to address the patient's elevated BP: Follow up with primary care provider for management.      Joylene Grapes, NP 10/21/2023, 7:33 PM

## 2023-10-20 NOTE — Patient Instructions (Signed)
 Medication Instructions:  Increase Losartan 50 mg daily   *If you need a refill on your cardiac medications before your next appointment, please call your pharmacy*   Lab Work: NONE ordered at this time of appointment   Testing/Procedures: NONE ordered at this time of appointment   Follow-Up: At [facility], you and your health needs are our priority.  As part of our continuing mission to provide you with exceptional heart care, we have created designated Provider Care Teams.  These Care Teams include your primary Cardiologist (physician) and Advanced Practice Providers (APPs -  Physician Assistants and Nurse Practitioners) who all work together to provide you with the care you need, when you need it.  We recommend signing up for the patient portal called "MyChart".  Sign up information is provided on this After Visit Summary.  MyChart is used to connect with patients for Virtual Visits (Telemedicine).  Patients are able to view lab/test results, encounter notes, upcoming appointments, etc.  Non-urgent messages can be sent to your provider as well.   To learn more about what you can do with MyChart, go to [email or web address].    Your next appointment:   3-4 month(s)  Provider:   Sharlene Dory, NP      Other Instructions Monitor blood pressure. Report BP consistently greater than 130/80.

## 2023-10-21 ENCOUNTER — Encounter: Payer: Self-pay | Admitting: Nurse Practitioner

## 2023-10-25 ENCOUNTER — Encounter (HOSPITAL_COMMUNITY): Payer: Self-pay

## 2023-10-25 ENCOUNTER — Ambulatory Visit (HOSPITAL_COMMUNITY): Admission: RE | Admit: 2023-10-25 | Source: Ambulatory Visit

## 2023-10-30 ENCOUNTER — Encounter: Payer: Self-pay | Admitting: Student in an Organized Health Care Education/Training Program

## 2023-11-01 ENCOUNTER — Ambulatory Visit
Admission: RE | Admit: 2023-11-01 | Discharge: 2023-11-01 | Disposition: A | Discharge status: Home / Self Care | Source: Ambulatory Visit | Attending: Student in an Organized Health Care Education/Training Program | Admitting: Student in an Organized Health Care Education/Training Program

## 2023-11-01 DIAGNOSIS — M1612 Unilateral primary osteoarthritis, left hip: Secondary | ICD-10-CM

## 2023-11-01 DIAGNOSIS — M160 Bilateral primary osteoarthritis of hip: Secondary | ICD-10-CM | POA: Diagnosis not present

## 2023-11-01 DIAGNOSIS — M25552 Pain in left hip: Secondary | ICD-10-CM | POA: Diagnosis not present

## 2023-11-01 DIAGNOSIS — S76312A Strain of muscle, fascia and tendon of the posterior muscle group at thigh level, left thigh, initial encounter: Secondary | ICD-10-CM | POA: Diagnosis not present

## 2023-11-01 DIAGNOSIS — M7062 Trochanteric bursitis, left hip: Secondary | ICD-10-CM

## 2023-11-09 ENCOUNTER — Ambulatory Visit (HOSPITAL_COMMUNITY)
Admission: RE | Admit: 2023-11-09 | Discharge: 2023-11-09 | Disposition: A | Discharge status: Home / Self Care | Source: Ambulatory Visit | Attending: Physician Assistant | Admitting: Physician Assistant

## 2023-11-09 DIAGNOSIS — R519 Headache, unspecified: Secondary | ICD-10-CM | POA: Diagnosis not present

## 2023-11-11 DIAGNOSIS — M25552 Pain in left hip: Secondary | ICD-10-CM | POA: Diagnosis not present

## 2023-11-21 DIAGNOSIS — M1612 Unilateral primary osteoarthritis, left hip: Secondary | ICD-10-CM | POA: Diagnosis not present

## 2023-11-24 LAB — HM DIABETES EYE EXAM

## 2023-11-25 ENCOUNTER — Other Ambulatory Visit: Payer: Self-pay | Admitting: Family Medicine

## 2023-11-25 ENCOUNTER — Encounter: Payer: Self-pay | Admitting: Family Medicine

## 2023-11-25 DIAGNOSIS — H3500 Unspecified background retinopathy: Secondary | ICD-10-CM

## 2023-11-25 NOTE — Progress Notes (Signed)
 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 Will place referral to ophthalmology for patient to follow up.

## 2023-11-27 DIAGNOSIS — L84 Corns and callosities: Secondary | ICD-10-CM | POA: Diagnosis not present

## 2023-11-27 DIAGNOSIS — B351 Tinea unguium: Secondary | ICD-10-CM | POA: Diagnosis not present

## 2023-11-27 DIAGNOSIS — M79674 Pain in right toe(s): Secondary | ICD-10-CM | POA: Diagnosis not present

## 2023-11-27 DIAGNOSIS — E1142 Type 2 diabetes mellitus with diabetic polyneuropathy: Secondary | ICD-10-CM | POA: Diagnosis not present

## 2023-11-27 DIAGNOSIS — M79675 Pain in left toe(s): Secondary | ICD-10-CM | POA: Diagnosis not present

## 2023-11-28 ENCOUNTER — Other Ambulatory Visit: Payer: Self-pay | Admitting: Family Medicine

## 2023-11-28 DIAGNOSIS — Z1231 Encounter for screening mammogram for malignant neoplasm of breast: Secondary | ICD-10-CM

## 2023-12-10 DIAGNOSIS — M4326 Fusion of spine, lumbar region: Secondary | ICD-10-CM | POA: Diagnosis not present

## 2023-12-10 DIAGNOSIS — M79605 Pain in left leg: Secondary | ICD-10-CM | POA: Diagnosis not present

## 2023-12-12 ENCOUNTER — Ambulatory Visit (INDEPENDENT_AMBULATORY_CARE_PROVIDER_SITE_OTHER): Admitting: Family Medicine

## 2023-12-12 ENCOUNTER — Encounter: Payer: Self-pay | Admitting: Family Medicine

## 2023-12-12 ENCOUNTER — Other Ambulatory Visit

## 2023-12-12 VITALS — BP 146/82 | HR 88 | Ht 62.0 in | Wt 168.0 lb

## 2023-12-12 DIAGNOSIS — E11319 Type 2 diabetes mellitus with unspecified diabetic retinopathy without macular edema: Secondary | ICD-10-CM

## 2023-12-12 DIAGNOSIS — E1159 Type 2 diabetes mellitus with other circulatory complications: Secondary | ICD-10-CM

## 2023-12-12 DIAGNOSIS — E785 Hyperlipidemia, unspecified: Secondary | ICD-10-CM

## 2023-12-12 DIAGNOSIS — E1169 Type 2 diabetes mellitus with other specified complication: Secondary | ICD-10-CM | POA: Diagnosis not present

## 2023-12-12 DIAGNOSIS — N644 Mastodynia: Secondary | ICD-10-CM | POA: Diagnosis not present

## 2023-12-12 DIAGNOSIS — I152 Hypertension secondary to endocrine disorders: Secondary | ICD-10-CM

## 2023-12-12 DIAGNOSIS — Z794 Long term (current) use of insulin: Secondary | ICD-10-CM | POA: Insufficient documentation

## 2023-12-12 LAB — BAYER DCA HB A1C WAIVED: HB A1C (BAYER DCA - WAIVED): 6.9 % — ABNORMAL HIGH (ref 4.8–5.6)

## 2023-12-12 MED ORDER — GLYXAMBI 25-5 MG PO TABS
1.0000 | ORAL_TABLET | Freq: Every day | ORAL | 3 refills | Status: AC
Start: 2023-12-12 — End: ?

## 2023-12-12 MED ORDER — ATORVASTATIN CALCIUM 40 MG PO TABS: 40.0000 mg | ORAL_TABLET | Freq: Every day | ORAL | 3 refills | Status: AC

## 2023-12-12 NOTE — Progress Notes (Signed)
 BP (!) 146/82   Pulse 88   Ht 5\' 2"  (1.575 m)   Wt 168 lb (76.2 kg)   SpO2 96%   BMI 30.73 kg/m    Subjective:   Patient ID: Felicia Pugh, female    DOB: July 15, 1946, 78 y.o.   MRN: [id number]  HPI: Felicia Pugh is a 78 y.o. female presenting on 12/12/2023 for Medical Management of Chronic Issues, Diabetes, and Edema (BLE- L>R)   HPI Type 2 diabetes mellitus Patient comes in today for recheck of his diabetes. Patient has been currently taking Glyxambi  and Tresiba . Patient is currently on an ACE inhibitor/ARB. Patient has seen an ophthalmologist this year. Patient denies any new issues with their feet. The symptom started onset as an adult hypertension and hyperlipidemia ARE RELATED TO DM   Hypertension Patient is currently on losartan , and their blood pressure today is 146/82 but she says runs better at home.. Patient denies any lightheadedness or dizziness. Patient denies headaches, blurred vision, chest pains, shortness of breath, or weakness. Denies any side effects from medication and is content with current medication.   Hyperlipidemia Patient is coming in for recheck of his hyperlipidemia. The patient is currently taking atorvastatin . They deny any issues with myalgias or history of liver damage from it. They deny any focal numbness or weakness or chest pain.   Right breast tenderness Patient has she has occasional right lateral breast tenderness.  She is due for mammograms but would like called and she said she was having breast tenderness they recommended she get a diagnostic instead of a normal.  Relevant past medical, surgical, family and social history reviewed and updated as indicated. Interim medical history since our last visit reviewed. Allergies and medications reviewed and updated.  Review of Systems  Constitutional:  Negative for chills and fever.  HENT:  Negative for congestion, ear discharge and ear pain.   Eyes:  Negative for visual disturbance.   Respiratory:  Negative for chest tightness and shortness of breath.   Cardiovascular:  Negative for chest pain and leg swelling.  Genitourinary:  Negative for difficulty urinating and dysuria.  Musculoskeletal:  Negative for back pain and gait problem.  Skin:  Negative for rash.  Neurological:  Negative for dizziness, light-headedness and headaches.  Psychiatric/Behavioral:  Negative for agitation and behavioral problems.   All other systems reviewed and are negative.   Per HPI unless specifically indicated above   Allergies as of 12/12/2023       Reactions   Metformin  And Related    Severe diarrhea   Asa [aspirin]    GI Dr does not want her to take due to hx ulcers   Nsaids Other (See Comments)   GI Dr does not want her to take due to hx of ulcers Instructed by GI not to take    Other Diarrhea   Steroids   Tolmetin Other (See Comments)   GI Dr does not want her to take due to hx of ulcers GI Dr does not want her to take due to hx of ulcers        Medication List        Accurate as of Dec 12, 2023 10:00 AM. If you have any questions, ask your nurse or doctor.          albuterol  108 (90 Base) MCG/ACT inhaler Commonly known as: VENTOLIN  HFA Inhale 2 puffs into the lungs every 6 (six) hours as needed for wheezing or shortness of breath.  atorvastatin  40 MG tablet Commonly known as: LIPITOR Take 1 tablet (40 mg total) by mouth daily.   balsalazide 750 MG capsule Commonly known as: COLAZAL  TAKE 2 CAPSULES BY MOUTH IN THE MORNING AND AT BEDTIME   ELDERBERRY IMMUNE HEALTH GUMMY PO Take by mouth.   fluticasone  50 MCG/ACT nasal spray Commonly known as: FLONASE  Place 1 spray into both nostrils 2 (two) times daily as needed for allergies or rhinitis.   Glyxambi  25-5 MG Tabs Generic drug: Empagliflozin -linaGLIPtin  Take 1 tablet by mouth daily.   Insulin  Syringe-Needle U-100 31G X 1/4" 1 ML Misc 1 Syringe by Does not apply route 2 (two) times daily.   loperamide   2 MG capsule Commonly known as: IMODIUM  Take 2 mg by mouth as needed for diarrhea or loose stools.   loratadine  10 MG tablet Commonly known as: CLARITIN  Take 1 tablet (10 mg total) by mouth daily.   losartan  50 MG tablet Commonly known as: COZAAR  Take 1 tablet (50 mg total) by mouth daily.   nystatin  powder Commonly known as: MYCOSTATIN /NYSTOP  APPLY POWDER TO AFFECTED AREA 3 TIMES DAILY   omeprazole 40 MG capsule Commonly known as: PRILOSEC Take 40 mg by mouth daily.   OneTouch Verio test strip Generic drug: glucose blood CHECK GLUCOSE TWICE DAILY Dx E11.69   Pen Needles 31G X 5 MM Misc Use to give insulin  with flexpen   Prevagen 10 MG Caps Generic drug: Apoaequorin Take by mouth.   Tresiba  FlexTouch 100 UNIT/ML FlexTouch Pen Generic drug: insulin  degludec INJECT 40 UNITS SUBCUTANEOUSLY ONCE DAILY   WOMENS 50+ MULTI VITAMIN/MIN PO Take by mouth.         Objective:   BP (!) 146/82   Pulse 88   Ht 5\' 2"  (1.575 m)   Wt 168 lb (76.2 kg)   SpO2 96%   BMI 30.73 kg/m   Wt Readings from Last 3 Encounters:  12/12/23 168 lb (76.2 kg)  10/20/23 169 lb 9.6 oz (76.9 kg)  10/15/23 165 lb (74.8 kg)    Physical Exam Vitals and nursing note reviewed.  Constitutional:      General: She is not in acute distress.    Appearance: She is well-developed. She is not diaphoretic.  Eyes:     Conjunctiva/sclera: Conjunctivae normal.  Cardiovascular:     Rate and Rhythm: Normal rate and regular rhythm.     Heart sounds: Normal heart sounds. No murmur heard. Pulmonary:     Effort: Pulmonary effort is normal. No respiratory distress.     Breath sounds: Normal breath sounds. No wheezing.  Chest:  Breasts:    Right: Tenderness present.    Musculoskeletal:        General: No tenderness. Normal range of motion.  Skin:    General: Skin is warm and dry.     Findings: No rash.  Neurological:     Mental Status: She is alert and oriented to person, place, and time.      Coordination: Coordination normal.  Psychiatric:        Behavior: Behavior normal.     Results for orders placed or performed in visit on 11/24/23  HM DIABETES EYE EXAM   Collection Time: 11/24/23  3:21 PM  Result Value Ref Range   HM Diabetic Eye Exam Retinopathy (A) No Retinopathy    Assessment & Plan:   Problem List Items Addressed This Visit       Cardiovascular and Mediastinum   Hypertension associated with diabetes (HCC)   Relevant Medications  atorvastatin  (LIPITOR) 40 MG tablet   Empagliflozin -linaGLIPtin  (GLYXAMBI ) 25-5 MG TABS     Endocrine   Hyperlipidemia associated with type 2 diabetes mellitus (HCC)   Relevant Medications   atorvastatin  (LIPITOR) 40 MG tablet   Empagliflozin -linaGLIPtin  (GLYXAMBI ) 25-5 MG TABS   Type 2 diabetes mellitus with other specified complication (HCC) - Primary   Relevant Medications   atorvastatin  (LIPITOR) 40 MG tablet   Empagliflozin -linaGLIPtin  (GLYXAMBI ) 25-5 MG TABS   Other Relevant Orders   Bayer DCA Hb A1c Waived   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  Ordered diagnostic mammogram because of right breast tenderness. Follow up plan: Return in about 3 months (around 03/13/2024), or if symptoms worsen or fail to improve, for Diabetes recheck.  Counseling provided for all of the vaccine components Orders Placed This Encounter  Procedures   MM Digital Diagnostic Bilat   Bayer DCA Hb A1c Waived    Jolyne Needs, MD [facility] 12/12/2023, 10:00 AM

## 2023-12-18 ENCOUNTER — Other Ambulatory Visit: Payer: Self-pay | Admitting: Family Medicine

## 2023-12-18 DIAGNOSIS — Z794 Long term (current) use of insulin: Secondary | ICD-10-CM

## 2023-12-19 DIAGNOSIS — M533 Sacrococcygeal disorders, not elsewhere classified: Secondary | ICD-10-CM | POA: Diagnosis not present

## 2023-12-19 DIAGNOSIS — Z9889 Other specified postprocedural states: Secondary | ICD-10-CM | POA: Diagnosis not present

## 2023-12-19 DIAGNOSIS — Z981 Arthrodesis status: Secondary | ICD-10-CM | POA: Diagnosis not present

## 2023-12-19 DIAGNOSIS — G8929 Other chronic pain: Secondary | ICD-10-CM | POA: Diagnosis not present

## 2023-12-23 ENCOUNTER — Other Ambulatory Visit: Payer: Self-pay

## 2023-12-23 DIAGNOSIS — N644 Mastodynia: Secondary | ICD-10-CM

## 2024-01-02 ENCOUNTER — Other Ambulatory Visit: Payer: Self-pay | Admitting: Gastroenterology

## 2024-01-02 DIAGNOSIS — K219 Gastro-esophageal reflux disease without esophagitis: Secondary | ICD-10-CM

## 2024-01-06 DIAGNOSIS — R490 Dysphonia: Secondary | ICD-10-CM | POA: Diagnosis not present

## 2024-01-14 DIAGNOSIS — M1612 Unilateral primary osteoarthritis, left hip: Secondary | ICD-10-CM | POA: Diagnosis not present

## 2024-01-14 DIAGNOSIS — M25552 Pain in left hip: Secondary | ICD-10-CM | POA: Diagnosis not present

## 2024-01-14 DIAGNOSIS — Z981 Arthrodesis status: Secondary | ICD-10-CM | POA: Diagnosis not present

## 2024-01-14 DIAGNOSIS — G8929 Other chronic pain: Secondary | ICD-10-CM | POA: Diagnosis not present

## 2024-01-15 ENCOUNTER — Ambulatory Visit (HOSPITAL_COMMUNITY)
Admission: RE | Admit: 2024-01-15 | Discharge: 2024-01-15 | Disposition: A | Discharge status: Home / Self Care | Source: Ambulatory Visit | Attending: Family Medicine | Admitting: Family Medicine

## 2024-01-15 ENCOUNTER — Ambulatory Visit (HOSPITAL_COMMUNITY)
Admission: RE | Admit: 2024-01-15 | Discharge: 2024-01-15 | Disposition: A | Discharge status: Home / Self Care | Source: Ambulatory Visit | Attending: Family Medicine

## 2024-01-15 ENCOUNTER — Other Ambulatory Visit (HOSPITAL_COMMUNITY): Payer: Self-pay | Admitting: Family Medicine

## 2024-01-15 ENCOUNTER — Encounter (HOSPITAL_COMMUNITY): Payer: Self-pay

## 2024-01-15 DIAGNOSIS — R92313 Mammographic fatty tissue density, bilateral breasts: Secondary | ICD-10-CM | POA: Diagnosis not present

## 2024-01-15 DIAGNOSIS — N6310 Unspecified lump in the right breast, unspecified quadrant: Secondary | ICD-10-CM | POA: Diagnosis not present

## 2024-01-15 DIAGNOSIS — N644 Mastodynia: Secondary | ICD-10-CM | POA: Insufficient documentation

## 2024-01-15 DIAGNOSIS — Z803 Family history of malignant neoplasm of breast: Secondary | ICD-10-CM | POA: Diagnosis not present

## 2024-01-15 DIAGNOSIS — R928 Other abnormal and inconclusive findings on diagnostic imaging of breast: Secondary | ICD-10-CM

## 2024-01-16 ENCOUNTER — Other Ambulatory Visit (HOSPITAL_COMMUNITY): Payer: Self-pay | Admitting: Family Medicine

## 2024-01-16 DIAGNOSIS — R928 Other abnormal and inconclusive findings on diagnostic imaging of breast: Secondary | ICD-10-CM

## 2024-01-20 ENCOUNTER — Encounter (HOSPITAL_COMMUNITY): Payer: Self-pay

## 2024-01-20 ENCOUNTER — Ambulatory Visit (HOSPITAL_COMMUNITY)
Admission: RE | Admit: 2024-01-20 | Discharge: 2024-01-20 | Disposition: A | Discharge status: Home / Self Care | Source: Ambulatory Visit | Attending: Family Medicine | Admitting: Family Medicine

## 2024-01-20 ENCOUNTER — Ambulatory Visit: Admitting: Nurse Practitioner

## 2024-01-20 ENCOUNTER — Other Ambulatory Visit (HOSPITAL_COMMUNITY): Payer: Self-pay | Admitting: Family Medicine

## 2024-01-20 DIAGNOSIS — R928 Other abnormal and inconclusive findings on diagnostic imaging of breast: Secondary | ICD-10-CM | POA: Diagnosis not present

## 2024-02-04 DIAGNOSIS — M6281 Muscle weakness (generalized): Secondary | ICD-10-CM | POA: Diagnosis not present

## 2024-02-04 DIAGNOSIS — M2569 Stiffness of other specified joint, not elsewhere classified: Secondary | ICD-10-CM | POA: Diagnosis not present

## 2024-02-04 DIAGNOSIS — M25552 Pain in left hip: Secondary | ICD-10-CM | POA: Diagnosis not present

## 2024-02-05 DIAGNOSIS — B351 Tinea unguium: Secondary | ICD-10-CM | POA: Diagnosis not present

## 2024-02-05 DIAGNOSIS — M79674 Pain in right toe(s): Secondary | ICD-10-CM | POA: Diagnosis not present

## 2024-02-05 DIAGNOSIS — E1142 Type 2 diabetes mellitus with diabetic polyneuropathy: Secondary | ICD-10-CM | POA: Diagnosis not present

## 2024-02-05 DIAGNOSIS — L84 Corns and callosities: Secondary | ICD-10-CM | POA: Diagnosis not present

## 2024-02-05 DIAGNOSIS — M79675 Pain in left toe(s): Secondary | ICD-10-CM | POA: Diagnosis not present

## 2024-02-09 DIAGNOSIS — M6281 Muscle weakness (generalized): Secondary | ICD-10-CM | POA: Diagnosis not present

## 2024-02-09 DIAGNOSIS — M2569 Stiffness of other specified joint, not elsewhere classified: Secondary | ICD-10-CM | POA: Diagnosis not present

## 2024-02-09 DIAGNOSIS — M25552 Pain in left hip: Secondary | ICD-10-CM | POA: Diagnosis not present

## 2024-02-11 DIAGNOSIS — M6281 Muscle weakness (generalized): Secondary | ICD-10-CM | POA: Diagnosis not present

## 2024-02-11 DIAGNOSIS — M25552 Pain in left hip: Secondary | ICD-10-CM | POA: Diagnosis not present

## 2024-02-11 DIAGNOSIS — M2569 Stiffness of other specified joint, not elsewhere classified: Secondary | ICD-10-CM | POA: Diagnosis not present

## 2024-02-16 DIAGNOSIS — M2569 Stiffness of other specified joint, not elsewhere classified: Secondary | ICD-10-CM | POA: Diagnosis not present

## 2024-02-16 DIAGNOSIS — M25552 Pain in left hip: Secondary | ICD-10-CM | POA: Diagnosis not present

## 2024-02-16 DIAGNOSIS — M6281 Muscle weakness (generalized): Secondary | ICD-10-CM | POA: Diagnosis not present

## 2024-02-18 DIAGNOSIS — M25552 Pain in left hip: Secondary | ICD-10-CM | POA: Diagnosis not present

## 2024-02-18 DIAGNOSIS — M6281 Muscle weakness (generalized): Secondary | ICD-10-CM | POA: Diagnosis not present

## 2024-02-18 DIAGNOSIS — M2569 Stiffness of other specified joint, not elsewhere classified: Secondary | ICD-10-CM | POA: Diagnosis not present

## 2024-02-23 DIAGNOSIS — M25552 Pain in left hip: Secondary | ICD-10-CM | POA: Diagnosis not present

## 2024-02-29 ENCOUNTER — Other Ambulatory Visit: Payer: Self-pay | Admitting: Family Medicine

## 2024-02-29 DIAGNOSIS — Z794 Long term (current) use of insulin: Secondary | ICD-10-CM

## 2024-03-04 ENCOUNTER — Ambulatory Visit: Attending: Nurse Practitioner | Admitting: Nurse Practitioner

## 2024-03-04 ENCOUNTER — Encounter: Payer: Self-pay | Admitting: Nurse Practitioner

## 2024-03-04 VITALS — BP 124/72 | HR 94 | Ht 62.0 in | Wt 169.2 lb

## 2024-03-04 DIAGNOSIS — R0789 Other chest pain: Secondary | ICD-10-CM | POA: Diagnosis not present

## 2024-03-04 DIAGNOSIS — R0609 Other forms of dyspnea: Secondary | ICD-10-CM

## 2024-03-04 DIAGNOSIS — E785 Hyperlipidemia, unspecified: Secondary | ICD-10-CM | POA: Diagnosis not present

## 2024-03-04 DIAGNOSIS — R5383 Other fatigue: Secondary | ICD-10-CM | POA: Diagnosis not present

## 2024-03-04 DIAGNOSIS — R0602 Shortness of breath: Secondary | ICD-10-CM

## 2024-03-04 NOTE — Patient Instructions (Addendum)
 Medication Instructions:  Your physician recommends that you continue on your current medications as directed. Please refer to the Current Medication list given to you today.  Labwork: In 1-2 weeks at [facility]  Testing/Procedures: Your physician has requested that you have an echocardiogram. Echocardiography is a painless test that uses sound waves to create images of your heart. It provides your doctor with information about the size and shape of your heart and how well your heart's chambers and valves are working. This procedure takes approximately one hour. There are no restrictions for this procedure. Please do NOT wear cologne, perfume, aftershave, or lotions (deodorant is allowed). Please arrive 15 minutes prior to your appointment time.  Please note: We ask at that you not bring children with you during ultrasound (echo/ vascular) testing. Due to room size and safety concerns, children are not allowed in the ultrasound rooms during exams. Our front office staff cannot provide observation of children in our lobby area while testing is being conducted. An adult accompanying a patient to their appointment will only be allowed in the ultrasound room at the discretion of the ultrasound technician under special circumstances. We apologize for any inconvenience.  Follow-Up: Your physician recommends that you schedule a follow-up appointment in: 6-8 weeks   Any Other Special Instructions Will Be Listed Below (If Applicable).  If you need a refill on your cardiac medications before your next appointment, please call your pharmacy.

## 2024-03-04 NOTE — Progress Notes (Signed)
 Cardiology Office Note   Date:  03/04/2024 ID:  Zanetta, Dehaan 1945/09/16, MRN [id number] PCP: Dettinger, Fonda LABOR, MD  [facility] Providers Cardiologist:  Vinie JAYSON Maxcy, MD     History of Present Illness Felicia Pugh is a 78 y.o. female with a PMH of chest pain, aortic atherosclerosis, HTN, HLD, T2DM, venous reflux, pulmonary nodule, Crohn's disease, anemia, and GERD, who presents today for 3-4 month follow-up visit.   ED visit in early March 2025 for intermittent CP, SHOB, and leg edema. CXR unremarkable. LE doppler negative for DVT. Workup negative for ACS. CBG was 35 upon arrival, improved to 100. Was referred to cardiology.  Last seen by Damien Braver, NP-C on October 20, 2023 for evaluation for chest pain and shortness of breath. At the time, she endorsed 3-4 month intermittent chest tightness and SHOB with rest and activity. Also noted intermittent dizziness. Noted some occasional numbness along left hand along with frequent right-sided headaches. Was pending CT scan - nothing acute noted on imaging.   Today she presents for follow-up.  She admits to intermittent dyspnea on exertion.  Admits to two-pillow orthopnea, cannot lay flat as she would get short of breath with this. Tells me she sleeps better in her recliner. Also admits to exertional chest pain along her right lower chest, along her arms as well. Has been going on for 1 month, brief in duration, and attributes this to from her activities such as cooking/baking and housework. Does admit to some swelling noted in her feet sometimes, knees are week. Denies any palpitations, syncope, presyncope, dizziness, orthopnea, PND, significant weight changes, acute bleeding, or claudication.  FH: Father - heart attack in his 48's, stroke in her mother.   ROS: negative. See HPI.   Studies Reviewed  EKG: EKG is not ordered today.   Echo 07/2016:  Study Conclusions   - Left ventricle: The cavity size was normal. Wall  thickness was    normal. Systolic function was vigorous. The estimated ejection    fraction was in the range of 65% to 70%. Wall motion was normal;    there were no regional wall motion abnormalities. Doppler    parameters are consistent with abnormal left ventricular    relaxation (grade 1 diastolic dysfunction).  - Aortic valve: Mildly calcified annulus. Trileaflet; mildly    thickened leaflets. Valve area (VTI): 3 cm^2. Valve area (Vmax):    2.48 cm^2.  - Systemic veins: IVC is small, suggesting low RA pressure and    hypovolemia.  - Technically adequate study.  Physical Exam VS:  BP 124/72   Pulse 94   Ht 5' 2 (1.575 m)   Wt 169 lb 3.2 oz (76.7 kg)   SpO2 95%   BMI 30.95 kg/m        Wt Readings from Last 3 Encounters:  03/04/24 169 lb 3.2 oz (76.7 kg)  12/12/23 168 lb (76.2 kg)  10/20/23 169 lb 9.6 oz (76.9 kg)    GEN: Well nourished, well developed in no acute distress NECK: No JVD; No carotid bruits CARDIAC: S1/S2, RRR, no murmurs, rubs, gallops RESPIRATORY:  Clear to auscultation without rales, wheezing or rhonchi  ABDOMEN: Soft, non-tender, non-distended EXTREMITIES:  No edema; No deformity   ASSESSMENT AND PLAN  Atypical chest pain Very atypical etiology, sounds MSK related.  Will arrange echocardiogram as noted below.  No indication for ischemic evaluation at this time.  Came to shared medical decision we will continue to monitor.  No medication  changes at this time. Will obtain labs.   2. DOE  Does admit to intermittent dyspnea on exertion.  Also admits to some lower extremity swelling as well as 2 pillow orthopnea.  Will arrange echocardiogram for further evaluation.   3. Fatigue Admits to staying tired. Will update Echo as noted above. Also will obtain thyroid  panel, CMET, and Mag.  Continue to follow with PCP.  4. HLD No recent labs on file.  Will obtain FLP in addition to labs as noted above. Continue to follow with PCP who is managing this. Continue  Lipitor. Heart healthy diet and regular cardiovascular exercise encouraged.    Dispo: Follow-up with MD/APP in 6 to 8 weeks or sooner any changes.  Signed, Almarie Crate, NP

## 2024-03-08 DIAGNOSIS — R0609 Other forms of dyspnea: Secondary | ICD-10-CM | POA: Diagnosis not present

## 2024-03-08 DIAGNOSIS — E785 Hyperlipidemia, unspecified: Secondary | ICD-10-CM | POA: Diagnosis not present

## 2024-03-08 DIAGNOSIS — R0602 Shortness of breath: Secondary | ICD-10-CM | POA: Diagnosis not present

## 2024-03-09 LAB — LIPID PANEL
Cholesterol: 181 mg/dL (ref <200)
HDL: 80 mg/dL (ref >=50)
LDL Cholesterol (Calc): 87 mg/dL
Non-HDL Cholesterol (Calc): 101 mg/dL (ref <130)
Total CHOL/HDL Ratio: 2.3 (calc) (ref <5.0)
Triglycerides: 59 mg/dL (ref <150)

## 2024-03-09 LAB — COMPREHENSIVE METABOLIC PANEL WITH GFR
AG Ratio: 1.6 (calc) (ref 1.0–2.5)
ALT: 11 U/L (ref 6–29)
AST: 16 U/L (ref 10–35)
Albumin: 4.1 g/dL (ref 3.6–5.1)
Alkaline phosphatase (APISO): 108 U/L (ref 37–153)
BUN: 10 mg/dL (ref 7–25)
CO2: 30 mmol/L (ref 20–32)
Calcium: 9.1 mg/dL (ref 8.6–10.4)
Chloride: 108 mmol/L (ref 98–110)
Creat: 0.63 mg/dL (ref 0.60–1.00)
Globulin: 2.5 g/dL (ref 1.9–3.7)
Glucose, Bld: 137 mg/dL — ABNORMAL HIGH (ref 65–99)
Potassium: 3.5 mmol/L (ref 3.5–5.3)
Sodium: 144 mmol/L (ref 135–146)
Total Bilirubin: 0.5 mg/dL (ref 0.2–1.2)
Total Protein: 6.6 g/dL (ref 6.1–8.1)
eGFR: 91 mL/min/1.73m2 (ref >=60)

## 2024-03-09 LAB — THYROID PANEL WITH TSH
Free Thyroxine Index: 2.5 (ref 1.4–3.8)
T3 Uptake: 33 % (ref 22–35)
T4, Total: 7.7 ug/dL (ref 5.1–11.9)
TSH: 1.36 m[IU]/L (ref 0.40–4.50)

## 2024-03-09 LAB — MAGNESIUM: Magnesium: 1.8 mg/dL (ref 1.5–2.5)

## 2024-03-12 ENCOUNTER — Ambulatory Visit: Payer: Self-pay | Admitting: Nurse Practitioner

## 2024-03-15 ENCOUNTER — Encounter: Payer: Self-pay | Admitting: Family Medicine

## 2024-03-15 ENCOUNTER — Ambulatory Visit (INDEPENDENT_AMBULATORY_CARE_PROVIDER_SITE_OTHER): Admitting: Family Medicine

## 2024-03-15 VITALS — BP 134/74 | HR 84 | Ht 62.0 in | Wt 169.0 lb

## 2024-03-15 DIAGNOSIS — E1159 Type 2 diabetes mellitus with other circulatory complications: Secondary | ICD-10-CM | POA: Diagnosis not present

## 2024-03-15 DIAGNOSIS — I8390 Asymptomatic varicose veins of unspecified lower extremity: Secondary | ICD-10-CM | POA: Diagnosis not present

## 2024-03-15 DIAGNOSIS — R300 Dysuria: Secondary | ICD-10-CM | POA: Diagnosis not present

## 2024-03-15 DIAGNOSIS — E785 Hyperlipidemia, unspecified: Secondary | ICD-10-CM | POA: Diagnosis not present

## 2024-03-15 DIAGNOSIS — Z794 Long term (current) use of insulin: Secondary | ICD-10-CM | POA: Diagnosis not present

## 2024-03-15 DIAGNOSIS — E1169 Type 2 diabetes mellitus with other specified complication: Secondary | ICD-10-CM | POA: Diagnosis not present

## 2024-03-15 DIAGNOSIS — I152 Hypertension secondary to endocrine disorders: Secondary | ICD-10-CM

## 2024-03-15 LAB — URINALYSIS, COMPLETE
Bilirubin, UA: NEGATIVE
Ketones, UA: NEGATIVE
Nitrite, UA: NEGATIVE
RBC, UA: NEGATIVE
Specific Gravity, UA: 1.02 (ref 1.005–1.030)
Urobilinogen, Ur: 0.2 mg/dL (ref 0.2–1.0)
pH, UA: 5.5 (ref 5.0–7.5)

## 2024-03-15 LAB — MICROSCOPIC EXAMINATION
Bacteria, UA: NONE SEEN
RBC, Urine: NONE SEEN /HPF (ref 0–2)
Renal Epithel, UA: NONE SEEN /HPF
Yeast, UA: NONE SEEN

## 2024-03-15 LAB — BAYER DCA HB A1C WAIVED: HB A1C (BAYER DCA - WAIVED): 7.3 % — ABNORMAL HIGH (ref 4.8–5.6)

## 2024-03-15 MED ORDER — TRESIBA FLEXTOUCH 100 UNIT/ML ~~LOC~~ SOPN: PEN_INJECTOR | SUBCUTANEOUS | 1 refills | Status: AC

## 2024-03-15 MED ORDER — PEN NEEDLES 31G X 5 MM MISC: 3 refills | Status: AC

## 2024-03-15 NOTE — Progress Notes (Signed)
 BP 134/74   Pulse 84   Ht 5' 2 (1.575 m)   Wt 169 lb (76.7 kg)   SpO2 98%   BMI 30.91 kg/m    Subjective:   Patient ID: Felicia Pugh, female    DOB: [birth year], 78 y.o.   MRN: [id number]  HPI: Felicia Pugh is a 78 y.o. female presenting on 03/15/2024 for Medical Management of Chronic Issues and Diabetes   Discussed the use of AI scribe software for clinical note transcription with the patient, who gave verbal consent to proceed.  History of Present Illness   Felicia Pugh is a 78 year old female with diabetes and hypertension who presents for a recheck of her blood sugar and blood pressure control.  Her blood sugar levels have been fluctuating, with readings of 97 mg/dL this morning, 879 mg/dL yesterday, and 829 mg/dL on some days last week. She checks her blood sugar levels in the mornings and is currently taking Glyxambi  and 40 units of Tresiba  daily for her diabetes.  Her blood pressure has also been variable. She last checked it three days ago, with readings of 102/68 mmHg and 120/70 mmHg on different days. She is taking losartan  50 mg for her hypertension.  She is also on atorvastatin  for cholesterol management and has no muscle soreness. She experiences leg pain, which she attributes to a nerve issue related to her back.  She is concerned about her feet, noting they have been 'turning hard' and have some darkness. She has been experiencing tingling and burning sensations in her feet for about a month, which she associates with her diabetes. She has been experiencing tingling and burning sensations in her feet for about a month, which she associates with her diabetes, and her recent A1c was 7.3%.  She has noticed more spider veins and varicose veins, which sometimes swell and become sore. She has ordered compression socks to help with this issue but has not received them yet. She finds it difficult to put on the socks due to back trouble.  In terms of her social history, she  is actively involved in raising her grandchildren, a five-year-old and a three-year-old, which keeps her physically active.       Relevant past medical, surgical, family and social history reviewed and updated as indicated. Interim medical history since our last visit reviewed. Allergies and medications reviewed and updated.  Review of Systems  Constitutional:  Negative for chills and fever.  HENT:  Negative for congestion, ear discharge and ear pain.   Eyes:  Negative for redness and visual disturbance.  Respiratory:  Negative for chest tightness and shortness of breath.   Cardiovascular:  Negative for chest pain and leg swelling.  Musculoskeletal:  Negative for back pain and gait problem.  Skin:  Negative for rash.  Neurological:  Positive for numbness. Negative for light-headedness and headaches.  Psychiatric/Behavioral:  Negative for agitation and behavioral problems.   All other systems reviewed and are negative.   Per HPI unless specifically indicated above   Allergies as of 03/15/2024       Reactions   Metformin  And Related    Severe diarrhea   Asa [aspirin]    GI Dr does not want her to take due to hx ulcers   Nsaids Other (See Comments)   GI Dr does not want her to take due to hx of ulcers Instructed by GI not to take    Other Diarrhea   Steroids   Tolmetin Other (  See Comments)   GI Dr does not want her to take due to hx of ulcers GI Dr does not want her to take due to hx of ulcers        Medication List        Accurate as of March 15, 2024 10:22 AM. If you have any questions, ask your nurse or doctor.          STOP taking these medications    albuterol  108 (90 Base) MCG/ACT inhaler Commonly known as: VENTOLIN  HFA Stopped by: Fonda LABOR Emmer Lillibridge   ELDERBERRY IMMUNE HEALTH GUMMY PO Stopped by: Fonda LABOR Trilby Way   loperamide  2 MG capsule Commonly known as: IMODIUM  Stopped by: Fonda LABOR Jeimy Bickert   loratadine  10 MG tablet Commonly known as:  CLARITIN  Stopped by: Fonda LABOR Geraldine Sandberg   nystatin  powder Commonly known as: MYCOSTATIN /NYSTOP  Stopped by: Fonda LABOR Ayyan Sites   omeprazole 40 MG capsule Commonly known as: PRILOSEC Stopped by: Fonda LABOR Latyra Jaye       TAKE these medications    acetaminophen  650 MG CR tablet Commonly known as: TYLENOL  Take 650 mg by mouth every 8 (eight) hours as needed for pain.   atorvastatin  40 MG tablet Commonly known as: LIPITOR Take 1 tablet (40 mg total) by mouth daily.   balsalazide 750 MG capsule Commonly known as: COLAZAL  TAKE 2 CAPSULES BY MOUTH IN THE MORNING AND AT BEDTIME   fluticasone  50 MCG/ACT nasal spray Commonly known as: FLONASE  Place 1 spray into both nostrils 2 (two) times daily as needed for allergies or rhinitis.   Glyxambi  25-5 MG Tabs Generic drug: Empagliflozin -linaGLIPtin  Take 1 tablet by mouth daily.   Insulin  Syringe-Needle U-100 31G X 1/4 1 ML Misc 1 Syringe by Does not apply route 2 (two) times daily.   losartan  50 MG tablet Commonly known as: COZAAR  Take 1 tablet (50 mg total) by mouth daily.   OneTouch Verio test strip Generic drug: glucose blood CHECK GLUCOSE TWICE DAILY Dx E11.69   Pen Needles 31G X 5 MM Misc Use to give insulin  with flexpen   Prevagen 10 MG Caps Generic drug: Apoaequorin Take by mouth.   Tresiba  FlexTouch 100 UNIT/ML FlexTouch Pen Generic drug: insulin  degludec INJECT 40 UNITS SUBCUTANEOUSLY ONCE DAILY   WOMENS 50+ MULTI VITAMIN/MIN PO Take by mouth.         Objective:   BP 134/74   Pulse 84   Ht 5' 2 (1.575 m)   Wt 169 lb (76.7 kg)   SpO2 98%   BMI 30.91 kg/m   Wt Readings from Last 3 Encounters:  03/15/24 169 lb (76.7 kg)  03/04/24 169 lb 3.2 oz (76.7 kg)  12/12/23 168 lb (76.2 kg)    Physical Exam Vitals and nursing note reviewed.  Constitutional:      General: She is not in acute distress.    Appearance: She is well-developed. She is not diaphoretic.  Eyes:     Conjunctiva/sclera:  Conjunctivae normal.  Cardiovascular:     Rate and Rhythm: Normal rate and regular rhythm.     Heart sounds: Normal heart sounds. No murmur heard. Pulmonary:     Effort: Pulmonary effort is normal. No respiratory distress.     Breath sounds: Normal breath sounds. No wheezing.  Musculoskeletal:        General: No swelling (Trace) or tenderness. Normal range of motion.  Neurological:     Mental Status: She is alert and oriented to person, place, and time.     Coordination:  Coordination normal.  Psychiatric:        Behavior: Behavior normal.    Physical Exam   VITALS: BP- 150/ CHEST: Lungs clear to auscultation bilaterally. CARDIOVASCULAR: Heart regular rate and rhythm. EXTREMITIES: Good sensation in feet, no cuts, no sores. NEUROLOGICAL: Sensation intact in feet.         Assessment & Plan:   Problem List Items Addressed This Visit       Cardiovascular and Mediastinum   Hypertension associated with diabetes (HCC)   Relevant Medications   insulin  degludec (TRESIBA  FLEXTOUCH) 100 UNIT/ML FlexTouch Pen     Endocrine   Hyperlipidemia associated with type 2 diabetes mellitus (HCC)   Relevant Medications   insulin  degludec (TRESIBA  FLEXTOUCH) 100 UNIT/ML FlexTouch Pen   Type 2 diabetes mellitus with other specified complication (HCC) - Primary   Relevant Medications   insulin  degludec (TRESIBA  FLEXTOUCH) 100 UNIT/ML FlexTouch Pen   Other Relevant Orders   Bayer DCA Hb A1c Waived   Other Visit Diagnoses       Dysuria       Relevant Orders   Urinalysis, Complete   Urine Culture     Varicose veins of ankle              Type 2 diabetes mellitus with diabetic neuropathy Blood glucose fluctuates between 97-170s. A1c at 7.3 indicates suboptimal control. Neuropathy symptoms likely exacerbated by elevated glucose. Non-adherence to diet and activity noted. - Continue Glyxambi  and Tresiba . - Encouraged adherence to dietary and activity recommendations. - Consider stationary  bicycle or pedal exerciser for increased activity.  Hypertension Home blood pressure readings variable. In-office reading elevated, possibly situational. On losartan  50 mg. - Recheck blood pressure before leaving office. - Monitor blood pressure at home regularly. - Blood pressure recheck was 130s over 70s, continue to monitor at home  Hyperlipidemia On atorvastatin  with no side effects. Liver function monitored. - Continue atorvastatin . - Monitor liver function tests regularly.  Varicose veins of lower extremities Spider and varicose veins present, causing occasional soreness. Compression stockings recommended but difficult to wear. - Use compression stockings if possible. - Apply ice to areas of swelling.       Follow up plan: Return in about 3 months (around 06/15/2024), or if symptoms worsen or fail to improve, for Diabetes.  Counseling provided for all of the vaccine components Orders Placed This Encounter  Procedures   Urine Culture   Bayer DCA Hb A1c Waived   Urinalysis, Complete    Fonda Levins, MD [facility] 03/15/2024, 10:22 AM

## 2024-03-17 ENCOUNTER — Ambulatory Visit: Payer: Self-pay | Admitting: Family Medicine

## 2024-03-17 LAB — URINE CULTURE

## 2024-03-19 ENCOUNTER — Other Ambulatory Visit: Payer: Self-pay | Admitting: Family Medicine

## 2024-03-26 ENCOUNTER — Telehealth: Payer: Self-pay | Admitting: Family Medicine

## 2024-03-26 NOTE — Telephone Encounter (Unsigned)
 Copied from CRM #[id number]. Topic: General - Other >> Mar 26, 2024 10:59 AM Myrick T wrote: Reason for CRM: patient called requesting a disable tag due to the swelling and pain she has in her legs. Please f/u with patient to assist with getting the paperwork completed

## 2024-03-29 NOTE — Telephone Encounter (Signed)
 Form completed. Pt aware to come by and pick up.

## 2024-03-29 NOTE — Telephone Encounter (Signed)
 Yes that is fine, we can fill out the handicap placard form for her car

## 2024-03-30 ENCOUNTER — Ambulatory Visit: Attending: Nurse Practitioner

## 2024-03-30 DIAGNOSIS — R0609 Other forms of dyspnea: Secondary | ICD-10-CM

## 2024-03-30 MED ORDER — PERFLUTREN LIPID MICROSPHERE
1.0000 mL | INTRAVENOUS | Status: AC | PRN
  Administered 2024-03-30: 1.5 mL via INTRAVENOUS

## 2024-03-31 LAB — ECHOCARDIOGRAM COMPLETE
AR max vel: 1.83 cm2
AV Area VTI: 2.31 cm2
AV Area mean vel: 2.18 cm2
AV Mean grad: 5 mmHg
AV Peak grad: 11.6 mmHg
AV Vena cont: 0.3 cm
Ao pk vel: 1.7 m/s
Area-P 1/2: 3.5 cm2
Calc EF: 71.5 %
MV VTI: 1.84 cm2
P 1/2 time: 779 ms
S' Lateral: 2.5 cm
Single Plane A2C EF: 77.1 %
Single Plane A4C EF: 62.1 %

## 2024-04-15 DIAGNOSIS — M79675 Pain in left toe(s): Secondary | ICD-10-CM | POA: Diagnosis not present

## 2024-04-15 DIAGNOSIS — E1142 Type 2 diabetes mellitus with diabetic polyneuropathy: Secondary | ICD-10-CM | POA: Diagnosis not present

## 2024-04-15 DIAGNOSIS — B351 Tinea unguium: Secondary | ICD-10-CM | POA: Diagnosis not present

## 2024-04-15 DIAGNOSIS — L84 Corns and callosities: Secondary | ICD-10-CM | POA: Diagnosis not present

## 2024-04-15 DIAGNOSIS — M79674 Pain in right toe(s): Secondary | ICD-10-CM | POA: Diagnosis not present

## 2024-04-20 ENCOUNTER — Encounter: Payer: Self-pay | Admitting: Nurse Practitioner

## 2024-04-20 ENCOUNTER — Ambulatory Visit: Attending: Nurse Practitioner | Admitting: Nurse Practitioner

## 2024-04-20 VITALS — BP 126/78 | HR 81 | Ht 62.0 in | Wt 168.6 lb

## 2024-04-20 DIAGNOSIS — R0609 Other forms of dyspnea: Secondary | ICD-10-CM

## 2024-04-20 DIAGNOSIS — J069 Acute upper respiratory infection, unspecified: Secondary | ICD-10-CM | POA: Diagnosis not present

## 2024-04-20 DIAGNOSIS — E669 Obesity, unspecified: Secondary | ICD-10-CM | POA: Diagnosis not present

## 2024-04-20 DIAGNOSIS — R5383 Other fatigue: Secondary | ICD-10-CM | POA: Diagnosis not present

## 2024-04-20 DIAGNOSIS — E785 Hyperlipidemia, unspecified: Secondary | ICD-10-CM | POA: Diagnosis not present

## 2024-04-20 NOTE — Patient Instructions (Addendum)
 Medication Instructions:  Your physician recommends that you continue on your current medications as directed. Please refer to the Current Medication list given to you today.  Labwork: None   Testing/Procedures: None   Follow-Up: Your physician recommends that you schedule a follow-up appointment in: 8 weeks   Any Other Special Instructions Will Be Listed Below (If Applicable).  If you need a refill on your cardiac medications before your next appointment, please call your pharmacy.

## 2024-04-20 NOTE — Progress Notes (Signed)
 Cardiology Office Note   Date:  04/20/2024 ID:  Martika, Egler [birth year], MRN [id number] PCP: Dettinger, Fonda LABOR, MD  [facility] Providers Cardiologist:  Vinie JAYSON Maxcy, MD     History of Present Illness Felicia Pugh is a 78 y.o. female with a PMH of chest pain, aortic atherosclerosis, HTN, HLD, T2DM, venous reflux, pulmonary nodule, Crohn's disease, anemia, and GERD, who presents today for 6 week follow-up visit.   ED visit in early March 2025 for intermittent CP, SHOB, and leg edema. CXR unremarkable. LE doppler negative for DVT. Workup negative for ACS. CBG was 35 upon arrival, improved to 100. Was referred to cardiology.  Last seen by Damien Braver, NP-C on October 20, 2023 for evaluation for chest pain and shortness of breath. At the time, she endorsed 3-4 month intermittent chest tightness and SHOB with rest and activity. Also noted intermittent dizziness. Noted some occasional numbness along left hand along with frequent right-sided headaches. Was pending CT scan - nothing acute noted on imaging.   03/04/2024 - Today she presents for follow-up.  She admits to intermittent dyspnea on exertion.  Admits to two-pillow orthopnea, cannot lay flat as she would get short of breath with this. Tells me she sleeps better in her recliner. Also admits to exertional chest pain along her right lower chest, along her arms as well. Has been going on for 1 month, brief in duration, and attributes this to from her activities such as cooking/baking and housework. Does admit to some swelling noted in her feet sometimes, knees are week. Denies any palpitations, syncope, presyncope, dizziness, orthopnea, PND, significant weight changes, acute bleeding, or claudication.  04/20/2024 - Here for follow-up. Doing better since I last saw her in the office. Denies any recurrent chest pain since last visit. Does admit to URI symptoms currently. Also wants to know options for weight loss, however does have hx of  Crohn's and IBS. Denies any chest pain, palpitations, syncope, presyncope, dizziness, orthopnea, PND, swelling or significant weight changes, acute bleeding, or claudication.  FH: Father - heart attack in his 46's, stroke in her mother.   ROS: negative. See HPI.   Studies Reviewed  EKG: EKG is not ordered today.   Echo 03/2024:  1. Left ventricular ejection fraction, by estimation, is 60 to 65%. The  left ventricle has normal function. Left ventricular endocardial border  not optimally defined to evaluate regional wall motion. Left ventricular  diastolic parameters are consistent  with Grade I diastolic dysfunction (impaired relaxation). Elevated left  atrial pressure.   2. Right ventricular systolic function is normal. The right ventricular  size is normal. Tricuspid regurgitation signal is inadequate for assessing  PA pressure.   3. The mitral valve is normal in structure. No evidence of mitral valve  regurgitation. No evidence of mitral stenosis.   4. The aortic valve has an indeterminant number of cusps. There is mild  calcification of the aortic valve. There is mild thickening of the aortic  valve. Aortic valve regurgitation is trivial. No aortic stenosis is  present.   Comparison(s): A prior study was performed on 07/23/2016. EF 65-70%.   Echo 07/2016:  Study Conclusions   - Left ventricle: The cavity size was normal. Wall thickness was    normal. Systolic function was vigorous. The estimated ejection    fraction was in the range of 65% to 70%. Wall motion was normal;    there were no regional wall motion abnormalities. Doppler    parameters  are consistent with abnormal left ventricular    relaxation (grade 1 diastolic dysfunction).  - Aortic valve: Mildly calcified annulus. Trileaflet; mildly    thickened leaflets. Valve area (VTI): 3 cm^2. Valve area (Vmax):    2.48 cm^2.  - Systemic veins: IVC is small, suggesting low RA pressure and    hypovolemia.  - Technically  adequate study.  Physical Exam VS:  BP 126/78   Pulse 81   Ht 5' 2 (1.575 m)   Wt 168 lb 9.6 oz (76.5 kg)   SpO2 98%   BMI 30.84 kg/m        Wt Readings from Last 3 Encounters:  04/20/24 168 lb 9.6 oz (76.5 kg)  03/15/24 169 lb (76.7 kg)  03/04/24 169 lb 3.2 oz (76.7 kg)    GEN: Well nourished, well developed in no acute distress NECK: No JVD; No carotid bruits CARDIAC: S1/S2, RRR, no murmurs, rubs, gallops RESPIRATORY:  Clear to auscultation without rales, wheezing or rhonchi  ABDOMEN: Soft, non-tender, non-distended EXTREMITIES:  No edema; No deformity   ASSESSMENT AND PLAN  1. DOE Currently admits to some URI symptoms. No adventitious breath sounds heard on exam.  Does admit to intermittent dyspnea on exertion.  Recent Echo does not explain her symptoms.  Recommended she follow-up with PCP to treat her URI symptoms and if symptoms continue to persist by next office visit, plan to consider ischemic evaluation.  2. Fatigue Admits to staying tired. Continue to follow with PCP for further evaluation.   3. HLD Most recent LDL 87.  Continue to follow with PCP who is managing this. Continue Lipitor. Heart healthy diet and regular cardiovascular exercise encouraged.   4. URI Admits to symptoms that sound like upper respiratory infection.  I recommend she follow-up with PCP for further evaluation.  No indication for chest x-ray at this time as no adventitious breath sounds are heard on exam.  5. Obesity Did discuss lifestyle changes. Heart healthy diet and regular cardiovascular exercise encouraged. Plan to discuss further at next office visit once URI symptoms are resolved.    Dispo: Follow-up with MD/APP in 8 weeks or sooner any changes.  Signed, Almarie Crate, NP

## 2024-04-29 ENCOUNTER — Ambulatory Visit (INDEPENDENT_AMBULATORY_CARE_PROVIDER_SITE_OTHER)

## 2024-04-29 ENCOUNTER — Ambulatory Visit (INDEPENDENT_AMBULATORY_CARE_PROVIDER_SITE_OTHER): Admitting: Family Medicine

## 2024-04-29 ENCOUNTER — Encounter: Payer: Self-pay | Admitting: Family Medicine

## 2024-04-29 ENCOUNTER — Telehealth: Payer: Self-pay | Admitting: Family Medicine

## 2024-04-29 ENCOUNTER — Ambulatory Visit: Payer: Self-pay

## 2024-04-29 VITALS — BP 121/71 | HR 93 | Temp 97.8°F | Ht 62.0 in | Wt 169.0 lb

## 2024-04-29 DIAGNOSIS — R0601 Orthopnea: Secondary | ICD-10-CM

## 2024-04-29 DIAGNOSIS — R053 Chronic cough: Secondary | ICD-10-CM

## 2024-04-29 NOTE — Telephone Encounter (Signed)
 E2C2 scheduled appointment.

## 2024-04-29 NOTE — Progress Notes (Addendum)
 BP 121/71   Pulse 93   Temp 97.8 F (36.6 C)   Ht 5' 2 (1.575 m)   Wt 169 lb (76.7 kg)   SpO2 95%   BMI 30.91 kg/m    Subjective:   Patient ID: Felicia Pugh, female    DOB: 1945-09-01, 78 y.o.   MRN: [id number]  HPI: Felicia Pugh is a 78 y.o. female presenting on 04/29/2024 for Cough (Persistent ) and Fatigue   Discussed the use of AI scribe software for clinical note transcription with the patient, who gave verbal consent to proceed.  History of Present Illness   Felicia Pugh is a 78 year old female who presents with a persistent cough and shortness of breath.  She has been experiencing a persistent cough that occurs constantly and sometimes produces mucus. The cough is severe enough to cause headaches and is associated with shortness of breath and fatigue. These symptoms have been present intermittently for months.  She has been using Zyrtec, Robitussin, and Flonase  intermittently to manage her symptoms. She recalls using an inhaler in the past, possibly for bronchitis, but has not been diagnosed with asthma despite her father's history of the condition.  No fevers, chills, or body aches, although she experiences aches due to arthritis. She reports occasional wheezing and chest soreness when coughing, but no significant chest pain or pressure.  She experiences difficulty sleeping in her bed due to shortness of breath and arm aches, requiring her to sleep propped up.          Relevant past medical, surgical, family and social history reviewed and updated as indicated. Interim medical history since our last visit reviewed. Allergies and medications reviewed and updated.  Review of Systems  Constitutional:  Negative for chills and fever.  HENT:  Positive for congestion. Negative for ear discharge, ear pain, sneezing and sore throat.   Eyes:  Negative for visual disturbance.  Respiratory:  Positive for cough and shortness of breath (Exertional shortness of breath and  orthopnea). Negative for chest tightness and wheezing.   Cardiovascular:  Negative for chest pain and leg swelling.  Genitourinary:  Negative for difficulty urinating and dysuria.  Musculoskeletal:  Negative for back pain and gait problem.  Skin:  Negative for rash.  Neurological:  Negative for light-headedness and headaches.  Psychiatric/Behavioral:  Negative for agitation and behavioral problems.   All other systems reviewed and are negative.   Per HPI unless specifically indicated above   Allergies as of 04/29/2024       Reactions   Metformin  And Related    Severe diarrhea   Asa [aspirin]    GI Dr does not want her to take due to hx ulcers   Nsaids Other (See Comments)   GI Dr does not want her to take due to hx of ulcers Instructed by GI not to take    Other Diarrhea   Steroids   Tolmetin Other (See Comments)   GI Dr does not want her to take due to hx of ulcers GI Dr does not want her to take due to hx of ulcers        Medication List        Accurate as of April 29, 2024  9:47 AM. If you have any questions, ask your nurse or doctor.          acetaminophen  650 MG CR tablet Commonly known as: TYLENOL  Take 650 mg by mouth every 8 (eight) hours as needed for pain.  atorvastatin  40 MG tablet Commonly known as: LIPITOR Take 1 tablet (40 mg total) by mouth daily.   balsalazide 750 MG capsule Commonly known as: COLAZAL  TAKE 2 CAPSULES BY MOUTH IN THE MORNING AND AT BEDTIME   fluticasone  50 MCG/ACT nasal spray Commonly known as: FLONASE  Place 1 spray into both nostrils 2 (two) times daily as needed for allergies or rhinitis.   Glyxambi  25-5 MG Tabs Generic drug: Empagliflozin -linaGLIPtin  Take 1 tablet by mouth daily.   Insulin  Syringe-Needle U-100 31G X 1/4 1 ML Misc 1 Syringe by Does not apply route 2 (two) times daily.   losartan  50 MG tablet Commonly known as: COZAAR  Take 1 tablet (50 mg total) by mouth daily.   OneTouch Verio test  strip Generic drug: glucose blood CHECK GLUCOSE TWICE DAILY Dx E11.69   Pen Needles 31G X 5 MM Misc Use to give insulin  with flexpen   Prevagen 10 MG Caps Generic drug: Apoaequorin Take by mouth.   Tresiba  FlexTouch 100 UNIT/ML FlexTouch Pen Generic drug: insulin  degludec INJECT 40 UNITS SUBCUTANEOUSLY ONCE DAILY   WOMENS 50+ MULTI VITAMIN/MIN PO Take by mouth.         Objective:   BP 121/71   Pulse 93   Temp 97.8 F (36.6 C)   Ht 5' 2 (1.575 m)   Wt 169 lb (76.7 kg)   SpO2 95%   BMI 30.91 kg/m   Wt Readings from Last 3 Encounters:  04/29/24 169 lb (76.7 kg)  04/20/24 168 lb 9.6 oz (76.5 kg)  03/15/24 169 lb (76.7 kg)    Physical Exam Vitals and nursing note reviewed.  Constitutional:      General: She is not in acute distress.    Appearance: She is well-developed. She is not diaphoretic.  Eyes:     Conjunctiva/sclera: Conjunctivae normal.  Cardiovascular:     Rate and Rhythm: Normal rate and regular rhythm.     Heart sounds: Normal heart sounds. No murmur heard. Pulmonary:     Effort: Pulmonary effort is normal. No respiratory distress.     Breath sounds: Normal breath sounds. No wheezing, rhonchi or rales.  Chest:     Chest wall: No tenderness.  Musculoskeletal:        General: No swelling or tenderness. Normal range of motion.  Skin:    General: Skin is warm and dry.     Findings: No rash.  Neurological:     Mental Status: She is alert and oriented to person, place, and time.     Coordination: Coordination normal.  Psychiatric:        Behavior: Behavior normal.               Chest x-ray: Lungs look clear except for she does have a very large gastric bubble/possible hiatal hernia.  Await final read from radiology.  Assessment & Plan:   Problem List Items Addressed This Visit   None Visit Diagnoses       Persistent cough    -  Primary   Relevant Orders   DG Chest 2 View   Ambulatory referral to Pulmonology     Orthopnea        Relevant Orders   DG Chest 2 View   Ambulatory referral to Pulmonology         Chronic cough with sputum production and dyspnea Chronic cough with sputum, dyspnea, and wheezing.  - Order chest X-ray to evaluate for underlying pulmonary conditions.    Large, questionable hiatal hernia, will refer  to pulmonology for shortness of breath and cough and orthopnea and also recommended for her to discuss the large gastric bubble with her gastroenterology      Follow up plan: Return if symptoms worsen or fail to improve.  Counseling provided for all of the vaccine components Orders Placed This Encounter  Procedures   DG Chest 2 View   Ambulatory referral to Pulmonology    Fonda Levins, MD [facility] 04/29/2024, 9:47 AM

## 2024-04-29 NOTE — Telephone Encounter (Signed)
 Copied from CRM [id number]. Topic: General - Other >> Apr 29, 2024  1:52 PM DeAngela L wrote: Reason for CRM: patient calling to ask if she can be referred to [facility] if this is possible, she believes [place] is a long ride, so she is trying to find a location closer   [facility] Address: [address], [place], [place] [address] Phone: [phone]  Pt num [phone] (H)

## 2024-04-29 NOTE — Telephone Encounter (Signed)
 Ok per Museum/gallery exhibitions officer to change to [employer]. New referral placed.

## 2024-04-29 NOTE — Telephone Encounter (Signed)
 FYI Only or Action Required?: FYI only for provider.  Patient was last seen in primary care on 03/15/2024 by Dettinger, Fonda LABOR, MD.  Called Nurse Triage reporting Cough.  Symptoms began yesterday.  Interventions attempted: Nothing.  Symptoms are: unchanged.  Triage Disposition: See HCP Within 4 Hours (Or PCP Triage)  Patient/caregiver understands and will follow disposition?: Yes   Copied from CRM #[id number]. Topic: Clinical - Red Word Triage >> Apr 29, 2024  8:04 AM Myrick T wrote: Red Word that prompted transfer to Nurse Triage: experiencing cough and congestion. She gets shortness for breath and wants to see a ling specialist Reason for Disposition  [1] MILD difficulty breathing (e.g., minimal/no SOB at rest, SOB with walking, pulse < 100) AND [2] still present when not coughing  Answer Assessment - Initial Assessment Questions 1. ONSET: When did the cough begin?      Yesterday and had it Sunday before last 2. SEVERITY: How bad is the cough today?      Mild to moderate 3. SPUTUM: Describe the color of your sputum (e.g., none, dry cough; clear, white, yellow, green)     Pale yellow 4. HEMOPTYSIS: Are you coughing up any blood? If Yes, ask: How much? (e.g., flecks, streaks, tablespoons, etc.)     denies 5. DIFFICULTY BREATHING: Are you having difficulty breathing? If Yes, ask: How bad is it? (e.g., mild, moderate, severe)      Sob when she coughs 6. FEVER: Do you have a fever? If Yes, ask: What is your temperature, how was it measured, and when did it start?     denies 7. CARDIAC HISTORY: Do you have any history of heart disease? (e.g., heart attack, congestive heart failure)      denies 8. LUNG HISTORY: Do you have any history of lung disease?  (e.g., pulmonary embolus, asthma, emphysema)     denies 9. PE RISK FACTORS: Do you have a history of blood clots? (or: recent major surgery, recent prolonged travel, bedridden)     denies 10. OTHER SYMPTOMS:  Do you have any other symptoms? (e.g., runny nose, wheezing, chest pain)       Wheezing at times 11. PREGNANCY: Is there any chance you are pregnant? When was your last menstrual period?       na 12. TRAVEL: Have you traveled out of the country in the last month? (e.g., travel history, exposures)       no  Protocols used: Cough - Acute Productive-A-AH

## 2024-05-13 ENCOUNTER — Encounter: Payer: Self-pay | Admitting: Gastroenterology

## 2024-05-24 ENCOUNTER — Ambulatory Visit: Admitting: Internal Medicine

## 2024-05-24 ENCOUNTER — Encounter: Payer: Self-pay | Admitting: Internal Medicine

## 2024-05-24 VITALS — BP 151/73 | HR 99 | Ht 62.0 in | Wt 168.8 lb

## 2024-05-24 DIAGNOSIS — E1159 Type 2 diabetes mellitus with other circulatory complications: Secondary | ICD-10-CM

## 2024-05-24 DIAGNOSIS — R058 Other specified cough: Secondary | ICD-10-CM

## 2024-05-24 DIAGNOSIS — I152 Hypertension secondary to endocrine disorders: Secondary | ICD-10-CM

## 2024-05-24 DIAGNOSIS — Z23 Encounter for immunization: Secondary | ICD-10-CM | POA: Diagnosis not present

## 2024-05-24 MED ORDER — PANTOPRAZOLE SODIUM 40 MG PO TBEC: 40.0000 mg | DELAYED_RELEASE_TABLET | Freq: Every day | ORAL | 2 refills | Status: DC

## 2024-05-24 MED ORDER — FAMOTIDINE 20 MG PO TABS: ORAL_TABLET | ORAL | 11 refills | Status: AC

## 2024-05-24 MED ORDER — BENZONATATE 200 MG PO CAPS: 200.0000 mg | ORAL_CAPSULE | Freq: Three times a day (TID) | ORAL | 1 refills | Status: DC | PRN

## 2024-05-24 MED ORDER — VALSARTAN 160 MG PO TABS: 160.0000 mg | ORAL_TABLET | Freq: Every day | ORAL | 11 refills | Status: DC

## 2024-05-24 NOTE — Progress Notes (Unsigned)
 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 Maryanne.  Pt c/o SOB x 2 months- occurs with exertion such as walking to her mailbox and back. She also get SOB when she lies down.  She also c/o cough that comes and goes-  mostly non prod   variable mostly dry cough / sob, globus sensation x 2 months, indolent onset daily >> noct symptoms with neg w/u to date while on ACEi / feels choked when lies down but fine once asleep no change on gerd rx to date but only been a week Rec Protononix (pantoprazole ) 40  Mg Take 30- 60 min before your first and last meals of the day  Stop lisinopril  and start losartan  100-25 one daily in its place GERD rx    05/24/2024 re-establish  - consult Dr Dettinger ov/[facility]/Raffi Milstein re: cough / sob / hoarseness  x Feb 2025 maint on no  resp rx and no longer on gerd rx   Chief Complaint  Patient presents with   Establish Care    Coughing - shob   Dyspnea:  [employer]  Cough: most productive no pattern  daytime > noct and slt beige assoc with pnds / sneezing  Sleeping: in recliner x July 2024  30 degrees s resp cc  SABA use: none 02: none   No obvious day to day or daytime variability or assoc excess/ purulent sputum or mucus plugs or hemoptysis or cp or chest tightness, subjective wheeze or overt sinus or hb symptoms.    Also denies any obvious fluctuation of symptoms with weather or environmental changes or other aggravating or alleviating factors except as outlined above   No unusual exposure hx or h/o childhood pna/ asthma or knowledge of  premature birth.  Current Allergies, Complete Past Medical History, Past Surgical History, Family History, and Social History were reviewed in [employer] record.  ROS  The following are not active complaints unless bolded Hoarseness, sore throat= globus, dysphagia, dental problems, itching, sneezing,  nasal congestion or discharge of excess mucus or purulent secretions, ear ache,   fever, chills, sweats, unintended wt loss or wt gain, classically pleuritic or exertional cp,  orthopnea pnd or arm/hand swelling  or leg swelling, presyncope, palpitations, abdominal pain, anorexia, nausea, vomiting, diarrhea  or change in bowel habits or change in bladder habits, change in stools or change in urine, dysuria, hematuria,  rash, arthralgias, visual complaints, headache, numbness (glove pattern L hand, dominant) , weakness or ataxia or problems with walking/uses cane or coordination,  change in mood or  memory.        Current Meds  Medication Sig   acetaminophen  (TYLENOL ) 650 MG CR tablet Take 650 mg by mouth every 8 (eight) hours as needed for pain.   Apoaequorin (PREVAGEN) 10 MG CAPS Take by mouth.   atorvastatin  (LIPITOR) 40 MG tablet Take 1 tablet (40 mg total) by mouth daily.   balsalazide (COLAZAL ) 750 MG capsule TAKE  2 CAPSULES BY MOUTH IN THE MORNING AND AT BEDTIME   Empagliflozin -linaGLIPtin  (GLYXAMBI ) 25-5 MG TABS Take 1 tablet by mouth daily.   fluticasone  (FLONASE ) 50 MCG/ACT nasal spray Place 1 spray into both nostrils 2 (two) times daily as needed for allergies or rhinitis.   glucose blood (ONETOUCH VERIO) test strip CHECK GLUCOSE TWICE DAILY Dx E11.69   insulin  degludec (TRESIBA  FLEXTOUCH) 100 UNIT/ML FlexTouch Pen INJECT 40 UNITS SUBCUTANEOUSLY ONCE DAILY   Insulin  Pen Needle (PEN NEEDLES) 31G X 5 MM MISC Use to give insulin  with flexpen   Insulin  Syringe-Needle U-100 31G X 1/4 1 ML MISC 1 Syringe by Does not apply route 2 (two) times daily.   losartan  (COZAAR ) 50  MG tablet Take 1 tablet (50 mg total) by mouth daily.   Multiple Vitamins-Minerals (WOMENS 50+ MULTI VITAMIN/MIN PO) Take by mouth.                Objective:   Physical Exam     05/24/2024   05/07/17 180 lb (81.6 kg)  05/02/17 179 lb 9.6 oz (81.5 kg)  05/01/17 187 lb (84.8 kg)    Vital signs reviewed  05/24/2024  - Note at rest 02 sats  98% on RA   General appearance:    very pleasant amb bf nad/ occ throat clearing   HEENT : Oropharynx  min cobblestoning/ erythema/     Nasal turbinates mild edema s polyps or purulent d/c   NECK :  without  apparent JVD/ palpable Nodes/TM    LUNGS: no acc muscle use,  Nl contour chest which is clear to A and P bilaterally without cough on insp or exp maneuvers   CV:  RRR  no s3 or murmur or increase in P2, and no edema   ABD:  soft and nontender   MS:   ext warm without deformities Or obvious joint restrictions  calf tenderness, cyanosis or clubbing    SKIN: warm and dry without lesions    NEURO:  alert, approp, nl sensorium with  no motor or cerebellar deficits apparent.       I personally reviewed images and agree with radiology impression as follows:  CXR:   pa and lateral   04/29/24  1. No acute findings. 2. Persistent elevation of the left hemidiaphragm.(Since 02/2000, the 1st cxr report in [facility]) 3. Lumbar fusion hardware.         Assessment:     Assessment & Plan Upper airway cough syndrome Trial off acei 05/07/2017 and off losartan  50 05/24/2024  - Allergy screen 05/24/2024 >  Eos 0. /  IgE 50  - max gerd rx 05/24/2024 >>>   Upper airway cough syndrome (previously labeled PNDS),  is so named because it's frequently impossible to sort out how much is  CR/sinusitis with freq throat clearing (which can be related to primary GERD)   vs  causing  secondary ( extra esophageal)  GERD from wide swings in gastric pressure that occur with throat clearing, often  promoting self use of mint and menthol lozenges that reduce the  lower esophageal sphincter tone and exacerbate the problem further in a cyclical fashion.   These are the same pts (now being labeled as having irritable larynx syndrome by some cough centers) who not infrequently have a history of having failed to tolerate ace inhibitors(as was the case here )  and generic losartan   ( For reasons that may related to vascular permability and nitric oxide pathways but not elevated  bradykinin  levels (as seen with  ACEi use) losartan  in the generic form has been reported now from mulitple sources  to cause a similar pattern of non-specific  upper airway symptoms as seen with acei which has not been reported with exposure to the other ARB's to date)  dry powder inhalers or biphosphonates or report having atypical/extraesophageal reflux symptoms from LPR (globus, throat clearing)  that don't respond to standard doses of PPI  and are easily confused as having aecopd or asthma flares by even experienced allergists/ pulmonologists (myself included).   Rec 1) max gerd rx  2) change losartan  to valsartan (see  hbp) 3) tessalon  200 mg up to qid prn  4) depomedrol 120 mg IM   in case of component of Th-2 driven upper or lower airways inflammation (if cough responds short term only to relapse before return while will on full rx for uacs (as above), then that would point to allergic rhinitis/ asthma or eos bronchitis as alternative dx)   5) labs above ok  6) f/u in 6 weeks with all meds in hand using a trust but verify approach to confirm accurate Medication  Reconciliation The principal here is that until we are certain that the  patients are doing what we've asked, it makes no sense to ask them to do more.   Add: needs walking study on return if still reporting doe p correct the cough.    Hypertension associated with diabetes (HCC) Trial off acei 05/07/2017  and off loartan 05/24/2024  - valsartan 160 mg one daily 05/24/2024 >>>   BP a bit high on losartan  50 but may not  need the 160mg  valsartan so suggested she start with one-half the 160 and self monitor / titrate frm there  Discussed in detail all the  indications, usual  risks and alternatives  relative to the benefits with patient who agrees to proceed with Rx as outlined.             Each maintenance medication was reviewed in detail including emphasizing most importantly the difference between maintenance and prns and under what circumstances the prns are to be triggered using an action plan format where appropriate.  Total time for H and P, chart review, counseling,  and generating customized AVS unique to this office visit / same day charting = 50 min with pt not seen > 3 y and  recurrent  refractory respiratory  symptoms of uncertain etiology             AVS  Patient Instructions  Stop losartan  and try valsartan 160 mg one half tablet daily and adjust upward if needed to one daily   Tessalon  pearls 200 mg up to 4 x daily if needed with goal to stop coughing completely   Pantoprazole  (protonix ) 40 mg   Take  30-60 min before first meal of the day and Pepcid (famotidine)  20 mg after supper until return to office - this is the best way to tell whether stomach acid is contributing to your problem.    GERD (REFLUX)  is an extremely common cause of respiratory symptoms just like yours , many times with no obvious heartburn at all.    It can be treated with medication, but also with lifestyle changes including elevation of the head of your bed (ideally with 6 -8inch blocks under the headboard of your bed),  Smoking cessation, avoidance of late meals, excessive alcohol, and avoid fatty foods, chocolate, peppermint, colas, red wine, and acidic juices  such as orange juice.  NO MINT OR MENTHOL PRODUCTS SO NO COUGH DROPS  USE SUGARLESS CANDY INSTEAD (Jolley ranchers or Stover's or Life Savers) or even ice chips will also do - the key is to swallow to prevent all throat clearing. NO OIL BASED VITAMINS - use  powdered substitutes.  Avoid fish oil when coughing.   Depomedrol 120 mg IM   Please remember to go to the lab department   for your tests - we will call you with the results when they are available.  You need to see your orthopedist about your arm pain which I suspect carpal tunnel syndrome   Please schedule a follow up office visit in 6 weeks, call sooner if needed              Ozell America, MD 05/25/2024

## 2024-05-24 NOTE — Assessment & Plan Note (Addendum)
 Trial off acei 05/07/2017 and off losartan  50 05/24/2024  - Allergy screen 05/24/2024 >  Eos 0.1 /  IgE 50  - max gerd rx 05/24/2024 >>>   Upper airway cough syndrome (previously labeled PNDS),  is so named because it's frequently impossible to sort out how much is  CR/sinusitis with freq throat clearing (which can be related to primary GERD)   vs  causing  secondary ( extra esophageal)  GERD from wide swings in gastric pressure that occur with throat clearing, often  promoting self use of mint and menthol lozenges that reduce the lower esophageal sphincter tone and exacerbate the problem further in a cyclical fashion.   These are the same pts (now being labeled as having irritable larynx syndrome by some cough centers) who not infrequently have a history of having failed to tolerate ace inhibitors(as was the case here )  and generic losartan   ( For reasons that may related to vascular permability and nitric oxide pathways but not elevated  bradykinin levels (as seen with  ACEi use) losartan  in the generic form has been reported now from mulitple sources  to cause a similar pattern of non-specific  upper airway symptoms as seen with acei which has not been reported with exposure to the other ARB's to date)  dry powder inhalers or biphosphonates or report having atypical/extraesophageal reflux symptoms from LPR (globus, throat clearing)  that don't respond to standard doses of PPI  and are easily confused as having aecopd or asthma flares by even experienced allergists/ pulmonologists (myself included).   Rec 1) max gerd rx  2) change losartan  to valsartan (see  hbp) 3) tessalon  200 mg up to qid prn  4) depomedrol 120 mg IM   in case of component of Th-2 driven upper or lower airways inflammation (if cough responds short term only to relapse before return while will on full rx for uacs (as above), then that would point to allergic rhinitis/ asthma or eos bronchitis as alternative dx)   5) labs above  ok  6) f/u in 6 weeks with all meds in hand using a trust but verify approach to confirm accurate Medication  Reconciliation The principal here is that until we are certain that the  patients are doing what we've asked, it makes no sense to ask them to do more.   Add: needs walking study on return if still reporting doe p the cough is addressed adequately

## 2024-05-24 NOTE — Patient Instructions (Addendum)
 Stop losartan  and try valsartan 160 mg one half tablet daily and adjust upward if needed to one daily   Tessalon  pearls 200 mg up to 4 x daily if needed with goal to stop coughing completely   Pantoprazole  (protonix ) 40 mg   Take  30-60 min before first meal of the day and Pepcid (famotidine)  20 mg after supper until return to office - this is the best way to tell whether stomach acid is contributing to your problem.    GERD (REFLUX)  is an extremely common cause of respiratory symptoms just like yours , many times with no obvious heartburn at all.    It can be treated with medication, but also with lifestyle changes including elevation of the head of your bed (ideally with 6 -8inch blocks under the headboard of your bed),  Smoking cessation, avoidance of late meals, excessive alcohol, and avoid fatty foods, chocolate, peppermint, colas, red wine, and acidic juices such as orange juice.  NO MINT OR MENTHOL PRODUCTS SO NO COUGH DROPS  USE SUGARLESS CANDY INSTEAD (Jolley ranchers or Stover's or Life Savers) or even ice chips will also do - the key is to swallow to prevent all throat clearing. NO OIL BASED VITAMINS - use powdered substitutes.  Avoid fish oil when coughing.   Depomedrol 120 mg IM   Please remember to go to the lab department   for your tests - we will call you with the results when they are available.  You need to see your orthopedist about your arm pain which I suspect carpal tunnel syndrome   Please schedule a follow up office visit in 6 weeks, call sooner if needed   Add: needs walking study on return if still reporting doe p correct the cough.

## 2024-05-24 NOTE — Assessment & Plan Note (Addendum)
 Trial off acei 05/07/2017  and off loartan 05/24/2024  - valsartan 160 mg one daily 05/24/2024 >>>   BP a bit high on losartan  50 but may not need the 160mg  valsartan so suggested she start with one-half the 160 and self monitor / titrate frm there  Discussed in detail all the  indications, usual  risks and alternatives  relative to the benefits with patient who agrees to proceed with Rx as outlined.             Each maintenance medication was reviewed in detail including emphasizing most importantly the difference between maintenance and prns and under what circumstances the prns are to be triggered using an action plan format where appropriate.  Total time for H and P, chart review, counseling,  and generating customized AVS unique to this office visit / same day charting = 50 min with pt not seen > 3 y and  recurrent  refractory respiratory  symptoms of uncertain etiology

## 2024-05-25 LAB — CBC WITH DIFFERENTIAL/PLATELET
Basophils Absolute: 0.1 x10E3/uL (ref 0.0–0.2)
Basos: 1 %
EOS (ABSOLUTE): 0.1 x10E3/uL (ref 0.0–0.4)
Eos: 1 %
Hematocrit: 39.9 % (ref 34.0–46.6)
Hemoglobin: 12.2 g/dL (ref 11.1–15.9)
Immature Grans (Abs): 0 x10E3/uL (ref 0.0–0.1)
Immature Granulocytes: 0 %
Lymphocytes Absolute: 1.8 x10E3/uL (ref 0.7–3.1)
Lymphs: 23 %
MCH: 27.7 pg (ref 26.6–33.0)
MCHC: 30.6 g/dL — ABNORMAL LOW (ref 31.5–35.7)
MCV: 91 fL (ref 79–97)
Monocytes Absolute: 0.6 x10E3/uL (ref 0.1–0.9)
Monocytes: 8 %
Neutrophils Absolute: 5.2 x10E3/uL (ref 1.4–7.0)
Neutrophils: 67 %
Platelets: 249 x10E3/uL (ref 150–450)
RBC: 4.41 x10E6/uL (ref 3.77–5.28)
RDW: 12.5 % (ref 11.7–15.4)
WBC: 7.8 x10E3/uL (ref 3.4–10.8)

## 2024-05-26 LAB — IGE: IgE (Immunoglobulin E), Serum: 50 [IU]/mL (ref 6–495)

## 2024-05-27 ENCOUNTER — Ambulatory Visit: Payer: Self-pay | Admitting: Internal Medicine

## 2024-05-27 ENCOUNTER — Ambulatory Visit: Admitting: Internal Medicine

## 2024-05-27 NOTE — Progress Notes (Signed)
 Atc x1 lmtcb

## 2024-05-28 DIAGNOSIS — I209 Angina pectoris, unspecified: Secondary | ICD-10-CM | POA: Diagnosis not present

## 2024-05-28 DIAGNOSIS — Z8249 Family history of ischemic heart disease and other diseases of the circulatory system: Secondary | ICD-10-CM | POA: Diagnosis not present

## 2024-05-28 DIAGNOSIS — E1140 Type 2 diabetes mellitus with diabetic neuropathy, unspecified: Secondary | ICD-10-CM | POA: Diagnosis not present

## 2024-05-28 DIAGNOSIS — Z794 Long term (current) use of insulin: Secondary | ICD-10-CM | POA: Diagnosis not present

## 2024-05-28 DIAGNOSIS — K5090 Crohn's disease, unspecified, without complications: Secondary | ICD-10-CM | POA: Diagnosis not present

## 2024-05-28 DIAGNOSIS — M1990 Unspecified osteoarthritis, unspecified site: Secondary | ICD-10-CM | POA: Diagnosis not present

## 2024-05-28 DIAGNOSIS — E785 Hyperlipidemia, unspecified: Secondary | ICD-10-CM | POA: Diagnosis not present

## 2024-05-28 DIAGNOSIS — K219 Gastro-esophageal reflux disease without esophagitis: Secondary | ICD-10-CM | POA: Diagnosis not present

## 2024-05-28 DIAGNOSIS — E1151 Type 2 diabetes mellitus with diabetic peripheral angiopathy without gangrene: Secondary | ICD-10-CM | POA: Diagnosis not present

## 2024-05-31 DIAGNOSIS — I152 Hypertension secondary to endocrine disorders: Secondary | ICD-10-CM | POA: Diagnosis not present

## 2024-05-31 DIAGNOSIS — Z23 Encounter for immunization: Secondary | ICD-10-CM

## 2024-05-31 DIAGNOSIS — E1159 Type 2 diabetes mellitus with other circulatory complications: Secondary | ICD-10-CM | POA: Diagnosis not present

## 2024-05-31 DIAGNOSIS — R058 Other specified cough: Secondary | ICD-10-CM | POA: Diagnosis not present

## 2024-06-02 ENCOUNTER — Ambulatory Visit (INDEPENDENT_AMBULATORY_CARE_PROVIDER_SITE_OTHER)

## 2024-06-02 ENCOUNTER — Telehealth: Payer: Self-pay | Admitting: Family Medicine

## 2024-06-02 VITALS — BP 151/73 | HR 99 | Ht 62.0 in | Wt 168.0 lb

## 2024-06-02 DIAGNOSIS — Z0000 Encounter for general adult medical examination without abnormal findings: Secondary | ICD-10-CM

## 2024-06-02 NOTE — Telephone Encounter (Signed)
 Yes the valsartan is okay to take in place of the losartan , just have her monitor her blood pressures over the next few weeks and let me know how it runs with the change.  The benzonatate  and famotidine are also good things to try for chronic cough so I am okay with her taking those as well.

## 2024-06-02 NOTE — Progress Notes (Signed)
 Atc x2 lmtcb sending letter

## 2024-06-02 NOTE — Telephone Encounter (Signed)
 Valsartan, Benzonatate  200mg  as needed TID, Famotidine 20mg  after supper,  Pantoprazole  40mg  every day. Started medications 10/13. Pt has not noticed a change.  Chronic cough Sometimes productive, sometimes not

## 2024-06-02 NOTE — Progress Notes (Unsigned)
 Subjective:   Felicia Pugh is a 78 y.o. who presents for a Medicare Wellness preventive visit.  As a reminder, Annual Wellness Visits don't include a physical exam, and some assessments may be limited, especially if this visit is performed virtually. We may recommend an in-person follow-up visit with your provider if needed.  Visit Complete: Virtual I connected with  Felicia Pugh on 06/02/24 by a audio enabled telemedicine application and verified that I am speaking with the correct person using two identifiers.  Patient Location: Home  Provider Location: Home Office  I discussed the limitations of evaluation and management by telemedicine. The patient expressed understanding and agreed to proceed.  Vital Signs: Because this visit was a virtual/telehealth visit, some criteria may be missing or patient reported. Any vitals not documented were not able to be obtained and vitals that have been documented are patient reported.  VideoDeclined- This patient declined Librarian, academic. Therefore the visit was completed with audio only.  Persons Participating in Visit: Patient.  AWV Questionnaire: No: Patient Medicare AWV questionnaire was not completed prior to this visit.  Cardiac Risk Factors include: advanced age (>58men, >63 women);diabetes mellitus;hypertension;obesity (BMI >30kg/m2)     Objective:    Today's Vitals   06/02/24 1000  BP: (!) 151/73  Pulse: 99  Weight: 168 lb (76.2 kg)  Height: 5' 2 (1.575 m)   Body mass index is 30.73 kg/m.     06/02/2024   10:10 AM 10/15/2023   11:26 AM 06/03/2023   11:11 AM 04/27/2023   10:35 AM 10/12/2022    8:46 AM 05/31/2022   11:03 AM 03/14/2022    6:06 PM  Advanced Directives  Does Patient Have a Medical Advance Directive? Yes No Yes No Yes No No  Type of Estate agent of [place];Living will  Healthcare Power of [place];Living will  Healthcare Power of Attorney    Copy of  Healthcare Power of Attorney in Chart? No - copy requested  No - copy requested      Would patient like information on creating a medical advance directive?  No - Patient declined  No - Patient declined  No - Patient declined     Current Medications (verified) Outpatient Encounter Medications as of 06/02/2024  Medication Sig   acetaminophen  (TYLENOL ) 650 MG CR tablet Take 650 mg by mouth every 8 (eight) hours as needed for pain.   Apoaequorin (PREVAGEN) 10 MG CAPS Take by mouth.   atorvastatin  (LIPITOR) 40 MG tablet Take 1 tablet (40 mg total) by mouth daily.   balsalazide (COLAZAL ) 750 MG capsule TAKE 2 CAPSULES BY MOUTH IN THE MORNING AND AT BEDTIME   benzonatate  (TESSALON ) 200 MG capsule Take 1 capsule (200 mg total) by mouth 3 (three) times daily as needed for cough.   Empagliflozin -linaGLIPtin  (GLYXAMBI ) 25-5 MG TABS Take 1 tablet by mouth daily.   famotidine (PEPCID) 20 MG tablet One after supper   fluticasone  (FLONASE ) 50 MCG/ACT nasal spray Place 1 spray into both nostrils 2 (two) times daily as needed for allergies or rhinitis.   glucose blood (ONETOUCH VERIO) test strip CHECK GLUCOSE TWICE DAILY Dx E11.69   insulin  degludec (TRESIBA  FLEXTOUCH) 100 UNIT/ML FlexTouch Pen INJECT 40 UNITS SUBCUTANEOUSLY ONCE DAILY   Insulin  Pen Needle (PEN NEEDLES) 31G X 5 MM MISC Use to give insulin  with flexpen   Insulin  Syringe-Needle U-100 31G X 1/4 1 ML MISC 1 Syringe by Does not apply route 2 (two) times daily.  Multiple Vitamins-Minerals (WOMENS 50+ MULTI VITAMIN/MIN PO) Take by mouth.   pantoprazole  (PROTONIX ) 40 MG tablet Take 1 tablet (40 mg total) by mouth daily. Take 30-60 min before first meal of the day   valsartan (DIOVAN) 160 MG tablet Take 1 tablet (160 mg total) by mouth daily.   losartan  (COZAAR ) 50 MG tablet Take 1 tablet (50 mg total) by mouth daily. (Patient not taking: Reported on 06/02/2024)   No facility-administered encounter medications on file as of 06/02/2024.     Allergies (verified) Metformin  and related, Asa [aspirin], Nsaids, Other, and Tolmetin   History: Past Medical History:  Diagnosis Date   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 Tona, no longer seeing anyone (02/2015)   Cataract    Complication of anesthesia    difficulty breathing after waking up from anesthesia   Crohn's disease (HCC) 2016   diagnosed in 2016 and started on Colazal , but inflammation/ulceration in ileum/cecum dates back to 2006.   Diabetes mellitus without complication (HCC)    Diabetic neuropathy (HCC)    Hyperlipidemia    Hypertension    IBS (irritable bowel syndrome)    Stroke (HCC) 08/12/2009   Syncope and collapse    Thyroid  nodule    Ulcer    Past Surgical History:  Procedure Laterality Date   ABDOMINAL HYSTERECTOMY     partial   BACK SURGERY     x3 (Dr Marcellus, Dr Gust)   BACK SURGERY  03/2022   BREAST CYST EXCISION Right 2002   COLONOSCOPY  2006   RMR: Normal colon except for ileocecal valve, eroded/ulcerated areas at ileocecal valve with mucosal hemorrhage present status post biopsy. ulcer (ileocecal valve, biopsy): Chronic active inflammation with ulceration, nonspecific.   COLONOSCOPY  09/06/2009   RMR: Suboptimal prep on the right side. Ulcers noted at IC valve, base of cecum, and scattered through food part of ascending colon. Pathology wiht chronic active colitis, question Crohn's   COLONOSCOPY N/A 03/27/2015   Dr. Shaaron: abnormal cecum and IC valve most consistent with IBD. TI intubated and appeared normal. Likely Crohn's disease. Patient denies NSAID use. No improvement with Lialda samples at time of initial diagnosis   COLONOSCOPY N/A 05/15/2017   Dr. Shaaron: ab normal IC valve, s/p biopsy with active ileitis, diverticulosis in sigmoid and descending colon. Non-bleeding internal hemorrhoids   ESOPHAGOGASTRODUODENOSCOPY     HAND SURGERY Right    Carpel tunnel and right elbow   LAPAROSCOPIC APPENDECTOMY N/A 07/21/2016    Procedure: APPENDECTOMY LAPAROSCOPIC;  Surgeon: Oneil Budge, MD;  Location: [facility];  Service: General;  Laterality: N/A;   Small bowel capsule endoscopy  06/10/2005   RMR: single erosion versus AVM, distal ileum   Small bowel capsule endoscopy  prior to 05/2005   [place]: reported erosions/ulcerations per medical record. actual report unavailable.   Family History  Problem Relation Age of Onset   Hypertension Mother    Stroke Mother    Diabetes Father        w/ BKA   Hypertension Father    Asthma Father    Cancer Sister    Diabetes Sister        stomach   Breast cancer Sister    Diabetes Sister    Cancer Sister        breast   Hypertension Sister    Breast cancer Daughter    Hypertension Daughter    Breast cancer Daughter    Diabetes Brother    Hypertension Son  Multiple sclerosis Son    Colon cancer Neg Hx    Inflammatory bowel disease Neg Hx    Social History   Socioeconomic History   Marital status: Widowed    Spouse name: Lamar   Number of children: 5   Years of education: 9th grade-then got her GED at [employer]   Highest education level: GED or equivalent  Occupational History   Occupation: retired  Tobacco Use   Smoking status: Never   Smokeless tobacco: Never  Vaping Use   Vaping status: Never Used  Substance and Sexual Activity   Alcohol use: No   Drug use: No   Sexual activity: Not Currently  Other Topics Concern   Not on file  Social History Narrative   Lives home with her husband - he is on dialysis - she helps care for him   Their children live nearby   She babysits her 79 year old grandchild   Social Drivers of Corporate investment banker Strain: Low Risk  (06/02/2024)   Overall Financial Resource Strain (CARDIA)    Difficulty of Paying Living Expenses: Not hard at all  Food Insecurity: No Food Insecurity (06/02/2024)   Hunger Vital Sign    Worried About Running Out of Food in the Last Year: Never true    Ran Out of Food in the Last Year: Never  true  Transportation Needs: No Transportation Needs (06/02/2024)   PRAPARE - Administrator, Civil Service (Medical): No    Lack of Transportation (Non-Medical): No  Physical Activity: Insufficiently Active (06/02/2024)   Exercise Vital Sign    Days of Exercise per Week: 3 days    Minutes of Exercise per Session: 30 min  Stress: No Stress Concern Present (06/02/2024)   [employer] of Occupational Health - Occupational Stress Questionnaire    Feeling of Stress: Not at all  Social Connections: Moderately Isolated (06/03/2023)   Social Connection and Isolation Panel    Frequency of Communication with Friends and Family: More than three times a week    Frequency of Social Gatherings with Friends and Family: More than three times a week    Attends Religious Services: More than 4 times per year    Active Member of [employer] or Organizations: No    Attends Banker Meetings: Never    Marital Status: Widowed    Tobacco Counseling Counseling given: Yes    Clinical Intake:  Pre-visit preparation completed: Yes  Pain : No/denies pain     BMI - recorded: 30.71 Nutritional Status: BMI > 30  Obese Nutritional Risks: None Diabetes: Yes  Lab Results  Component Value Date   HGBA1C 7.3 (H) 03/15/2024   HGBA1C 6.9 (H) 12/12/2023   HGBA1C 8.0 (H) 08/29/2023     How often do you need to have someone help you when you read instructions, pamphlets, or other written materials from your doctor or pharmacy?: 1 - Never  Interpreter Needed?: No  Information entered by :: alia t/cma   Activities of Daily Living     06/02/2024   10:10 AM  In your present state of health, do you have any difficulty performing the following activities:  Hearing? 0  Vision? 0  Difficulty concentrating or making decisions? 1  Walking or climbing stairs? 0  Dressing or bathing? 0  Doing errands, shopping? 0  Comment only in town  Quarry manager and eating ? N  Using the  Toilet? N  In the past six months, have you  accidently leaked urine? N  Do you have problems with loss of bowel control? N  Managing your Medications? N  Managing your Finances? N  Housekeeping or managing your Housekeeping? N    Patient Care Team: Dettinger, Fonda LABOR, MD as PCP - General (Family Medicine) Mona Vinie BROCKS, MD as PCP - Cardiology (Cardiology) Shaaron Lamar HERO, MD as Consulting Physician (Gastroenterology) Shirlean Therisa ORN, NP (Gastroenterology) Carolee Lynwood JINNY DOUGLAS, MD as Consulting Physician (Orthopedic Surgery) Roddie Bring, DPM as Consulting Physician (Podiatry) Bonner Ade, MD as Consulting Physician (Physical Medicine and Rehabilitation) Vicci Mcardle, OD (Optometry)  I have updated your Care Teams any recent Medical Services you may have received from other providers in the past year.     Assessment:   This is a routine wellness examination for Czarina.  Hearing/Vision screen Hearing Screening - Comments:: Pt wear hearing aids Vision Screening - Comments:: Pt wear glasses/pt goes to [facility] in [place], [place]/last ov 01/2024   Goals Addressed             This Visit's Progress    Patient Stated   On track    05/31/2022 AWV Goal: Improved Nutrition/Diet  Patient will verbalize understanding that diet plays an important role in overall health and that a poor diet is a risk factor for many chronic medical conditions.  Over the next year, patient will improve self management of their diet by incorporating better food choices. Patient will utilize available community resources to help with food acquisition if needed (ex: food pantries, [address], etc) Patient will work with nutrition specialist if a referral was made        Depression Screen     06/02/2024   10:11 AM 04/29/2024    9:15 AM 03/15/2024    9:30 AM 12/12/2023    9:43 AM 08/29/2023    9:49 AM 07/04/2023   11:47 AM 06/03/2023   11:10 AM  PHQ 2/9 Scores  PHQ - 2 Score 0 0 0 0 0 0 0  PHQ- 9  Score 1 3    0 0    Fall Risk     04/29/2024    9:15 AM 03/15/2024    9:30 AM 12/12/2023    9:43 AM 08/29/2023    9:49 AM 07/04/2023   11:47 AM  Fall Risk   Falls in the past year? 0 0 1 0 0  Number falls in past yr: 0 0 1 0   Injury with Fall? 0 0 0 0   Risk for fall due to : Impaired balance/gait No Fall Risks Impaired balance/gait;Impaired mobility No Fall Risks   Follow up Falls evaluation completed Falls evaluation completed Falls evaluation completed Falls evaluation completed     MEDICARE RISK AT HOME:  Medicare Risk at Home Any stairs in or around the home?: No If so, are there any without handrails?: No Home free of loose throw rugs in walkways, pet beds, electrical cords, etc?: Yes Adequate lighting in your home to reduce risk of falls?: Yes Life alert?: No Use of a cane, walker or w/c?: Yes Grab bars in the bathroom?: Yes Shower chair or bench in shower?: Yes Elevated toilet seat or a handicapped toilet?: Yes  TIMED UP AND GO:  Was the test performed?  no  Cognitive Function: 6CIT completed    05/19/2018   11:44 AM 05/01/2017   11:09 AM  MMSE - Mini Mental State Exam  Orientation to time 5 5   Orientation to Place 5 5  Registration 3 3   Attention/ Calculation 4 4   Recall 2 2   Language- name 2 objects 2 2   Language- repeat 1 1  Language- follow 3 step command 3 3   Language- read & follow direction 1 1   Write a sentence 1 1   Copy design 1 1   Total score 28 28      Data saved with a previous flowsheet row definition        06/02/2024   10:10 AM 06/03/2023   11:12 AM 05/31/2022   11:09 AM 05/24/2020   10:40 AM 05/24/2019   10:49 AM  6CIT Screen  What Year? 0 points 0 points 0 points 0 points 0 points  What month? 0 points 0 points 0 points 0 points 0 points  What time? 0 points 0 points 0 points 0 points 0 points  Count back from 20 0 points 0 points 0 points 0 points 0 points  Months in reverse 0 points 0 points 0 points 0 points 0 points   Repeat phrase 0 points 0 points  0 points 0 points  Total Score 0 points 0 points  0 points 0 points    Immunizations Immunization History  Administered Date(s) Administered   Fluad Quad(high Dose 65+) 05/01/2020, 05/04/2021, 07/03/2022   Fluad Trivalent(High Dose 65+) 05/29/2023   INFLUENZA, HIGH DOSE SEASONAL PF 05/19/2018, 04/24/2019, 05/31/2024   Influenza,inj,Quad PF,6+ Mos 05/13/2013, 05/27/2014, 06/22/2015, 06/11/2016, 06/26/2017   Moderna Sars-Covid-2 Vaccination 09/23/2019, 10/26/2019, 06/26/2020, 12/27/2020   Pneumococcal Conjugate-13 07/08/2014   Pneumococcal Polysaccharide-23 12/16/2016   Tdap 12/30/2017   Zoster Recombinant(Shingrix ) 07/14/2019, 12/12/2021    Screening Tests Health Maintenance  Topic Date Due   COVID-19 Vaccine (5 - 2025-26 season) 04/12/2024   Diabetic kidney evaluation - Urine ACR  08/28/2024   HEMOGLOBIN A1C  09/15/2024   OPHTHALMOLOGY EXAM  11/23/2024   Diabetic kidney evaluation - eGFR measurement  03/08/2025   FOOT EXAM  03/15/2025   Medicare Annual Wellness (AWV)  06/02/2025   DEXA SCAN  09/25/2025   DTaP/Tdap/Td (2 - Td or Tdap) 12/31/2027   Pneumococcal Vaccine: 50+ Years  Completed   Influenza Vaccine  Completed   Hepatitis C Screening  Completed   Zoster Vaccines- Shingrix   Completed   Meningococcal B Vaccine  Aged Out   Mammogram  Discontinued   Colonoscopy  Discontinued    Health Maintenance Items Addressed: See Nurse Notes at the end of this note  Additional Screening:  Vision Screening: Recommended annual ophthalmology exams for early detection of glaucoma and other disorders of the eye. Is the patient up to date with their annual eye exam?  Yes  Who is the provider or what is the name of the office in which the patient attends annual eye exams? [facility]  Dental Screening: Recommended annual dental exams for proper oral hygiene  Community Resource Referral / Chronic Care Management: CRR required this  visit?  No   CCM required this visit?  No   Plan:    I have personally reviewed and noted the following in the patient's chart:   Medical and social history Use of alcohol, tobacco or illicit drugs  Current medications and supplements including opioid prescriptions. Patient is not currently taking opioid prescriptions. Functional ability and status Nutritional status Physical activity Advanced directives List of other physicians Hospitalizations, surgeries, and ER visits in previous 12 months Vitals Screenings to include cognitive, depression, and falls Referrals and appointments  In addition, I  have reviewed and discussed with patient certain preventive protocols, quality metrics, and best practice recommendations. A written personalized care plan for preventive services as well as general preventive health recommendations were provided to patient.   Ozie Ned, CMA   06/02/2024   After Visit Summary: (MyChart) Due to this being a telephonic visit, the after visit summary with patients personalized plan was offered to patient via MyChart   Notes: During awv pt stated that she has not started the Valsartan med because pt was unsure of the dose and pt's crohn's dis. Pt asked if she should start the med as per pulmonary pcp's order. Per pt pulmonologist's order is to stop the losartan  and to start the valsartan. Suggest pt to do so, per pulmonologist order. suggest pt to monitor her bp per pulmonologist.  If it gets too low for her to cut it in half which per pt is aware. Suggest pt to call the pulmonologist if she starts to have any side-effects to the med ie diarrhea, per pt aggree

## 2024-06-02 NOTE — Telephone Encounter (Signed)
 Dr. Darlean change\d her B/P medicine to Valsartan 160 mg. Wants to know if it's ok to take.States he put her three other medications too and wants to talk to nurse.

## 2024-06-02 NOTE — Telephone Encounter (Signed)
 Pt made aware and understood. She will let us  know her BP readings in the next 2-3w

## 2024-06-02 NOTE — Patient Instructions (Signed)
 Felicia Pugh,  Thank you for taking the time for your Medicare Wellness Visit. I appreciate your continued commitment to your health goals. Please review the care plan we discussed, and feel free to reach out if I can assist you further.  Medicare recommends these wellness visits once per year to help you and your care team stay ahead of potential health issues. These visits are designed to focus on prevention, allowing your provider to concentrate on managing your acute and chronic conditions during your regular appointments.  Please note that Annual Wellness Visits do not include a physical exam. Some assessments may be limited, especially if the visit was conducted virtually. If needed, we may recommend a separate in-person follow-up with your provider.  Ongoing Care Seeing your primary care provider every 3 to 6 months helps us  monitor your health and provide consistent, personalized care.   Referrals If a referral was made during today's visit and you haven't received any updates within two weeks, please contact the referred provider directly to check on the status.  Recommended Screenings:  Health Maintenance  Topic Date Due   COVID-19 Vaccine (5 - 2025-26 season) 04/12/2024   Medicare Annual Wellness Visit  06/02/2024   Yearly kidney health urinalysis for diabetes  08/28/2024   Hemoglobin A1C  09/15/2024   Eye exam for diabetics  11/23/2024   Yearly kidney function blood test for diabetes  03/08/2025   Complete foot exam   03/15/2025   DEXA scan (bone density measurement)  09/25/2025   DTaP/Tdap/Td vaccine (2 - Td or Tdap) 12/31/2027   Pneumococcal Vaccine for age over 66  Completed   Flu Shot  Completed   Hepatitis C Screening  Completed   Zoster (Shingles) Vaccine  Completed   Meningitis B Vaccine  Aged Out   Breast Cancer Screening  Discontinued   Colon Cancer Screening  Discontinued       06/02/2024   10:10 AM  Advanced Directives  Does Patient Have a Medical Advance  Directive? Yes  Type of Estate agent of [place];Living will  Copy of Healthcare Power of Attorney in Chart? No - copy requested   Advance Care Planning is important because it: Ensures you receive medical care that aligns with your values, goals, and preferences. Provides guidance to your family and loved ones, reducing the emotional burden of decision-making during critical moments.  Vision: Annual vision screenings are recommended for early detection of glaucoma, cataracts, and diabetic retinopathy. These exams can also reveal signs of chronic conditions such as diabetes and high blood pressure.  Dental: Annual dental screenings help detect early signs of oral cancer, gum disease, and other conditions linked to overall health, including heart disease and diabetes.  Please see the attached documents for additional preventive care recommendations.

## 2024-06-02 NOTE — Telephone Encounter (Signed)
 Copied from CRM [id number]. Topic: Clinical - Lab/Test Results >> May 27, 2024 12:00 PM Whitney O wrote: Reason for CRM: patient returning call she received concerning her lab results . Look like CMA Taya Ashbaugh called patient AND left voicemail . Called cal but  no one is answering . Please give patient a call back concerning results  [phone]  See phone note

## 2024-06-03 ENCOUNTER — Encounter

## 2024-06-03 ENCOUNTER — Other Ambulatory Visit: Payer: Self-pay

## 2024-06-03 ENCOUNTER — Ambulatory Visit: Attending: Orthopedic Surgery | Admitting: Physical Therapy

## 2024-06-03 ENCOUNTER — Encounter: Payer: Self-pay | Admitting: Physical Therapy

## 2024-06-03 DIAGNOSIS — M25552 Pain in left hip: Secondary | ICD-10-CM | POA: Diagnosis not present

## 2024-06-03 DIAGNOSIS — M62838 Other muscle spasm: Secondary | ICD-10-CM | POA: Diagnosis not present

## 2024-06-03 DIAGNOSIS — M6281 Muscle weakness (generalized): Secondary | ICD-10-CM | POA: Diagnosis not present

## 2024-06-03 NOTE — Progress Notes (Signed)
 Subjective:   Felicia Pugh is a 78 y.o. who presents for a Medicare Wellness preventive visit.  As a reminder, Annual Wellness Visits don't include a physical exam, and some assessments may be limited, especially if this visit is performed virtually. We may recommend an in-person follow-up visit with your provider if needed.  Visit Complete: Virtual I connected with  Felicia Pugh on 06/02/24 by a audio enabled telemedicine application and verified that I am speaking with the correct person using two identifiers.  Patient Location: Home  Provider Location: Home Office  I discussed the limitations of evaluation and management by telemedicine. The patient expressed understanding and agreed to proceed.  Vital Signs: Because this visit was a virtual/telehealth visit, some criteria may be missing or patient reported. Any vitals not documented were not able to be obtained and vitals that have been documented are patient reported.  VideoDeclined- This patient declined Librarian, academic. Therefore the visit was completed with audio only.  Persons Participating in Visit: Patient.  AWV Questionnaire: No: Patient Medicare AWV questionnaire was not completed prior to this visit.  Cardiac Risk Factors include: advanced age (>63men, >71 women);diabetes mellitus;hypertension;obesity (BMI >30kg/m2)     Objective:    Today's Vitals   06/02/24 1000  BP: (!) 151/73  Pulse: 99  Weight: 168 lb (76.2 kg)  Height: 5' 2 (1.575 m)   Body mass index is 30.73 kg/m.     06/02/2024   10:10 AM 10/15/2023   11:26 AM 06/03/2023   11:11 AM 04/27/2023   10:35 AM 10/12/2022    8:46 AM 05/31/2022   11:03 AM 03/14/2022    6:06 PM  Advanced Directives  Does Patient Have a Medical Advance Directive? Yes No Yes No Yes No No  Type of Estate agent of [place];Living will  Healthcare Power of [place];Living will  Healthcare Power of Attorney    Copy of  Healthcare Power of Attorney in Chart? No - copy requested  No - copy requested      Would patient like information on creating a medical advance directive?  No - Patient declined  No - Patient declined  No - Patient declined     Current Medications (verified) Outpatient Encounter Medications as of 06/02/2024  Medication Sig   acetaminophen  (TYLENOL ) 650 MG CR tablet Take 650 mg by mouth every 8 (eight) hours as needed for pain.   Apoaequorin (PREVAGEN) 10 MG CAPS Take by mouth.   atorvastatin  (LIPITOR) 40 MG tablet Take 1 tablet (40 mg total) by mouth daily.   balsalazide (COLAZAL ) 750 MG capsule TAKE 2 CAPSULES BY MOUTH IN THE MORNING AND AT BEDTIME   benzonatate  (TESSALON ) 200 MG capsule Take 1 capsule (200 mg total) by mouth 3 (three) times daily as needed for cough.   Empagliflozin -linaGLIPtin  (GLYXAMBI ) 25-5 MG TABS Take 1 tablet by mouth daily.   famotidine (PEPCID) 20 MG tablet One after supper   fluticasone  (FLONASE ) 50 MCG/ACT nasal spray Place 1 spray into both nostrils 2 (two) times daily as needed for allergies or rhinitis.   glucose blood (ONETOUCH VERIO) test strip CHECK GLUCOSE TWICE DAILY Dx E11.69   insulin  degludec (TRESIBA  FLEXTOUCH) 100 UNIT/ML FlexTouch Pen INJECT 40 UNITS SUBCUTANEOUSLY ONCE DAILY   Insulin  Pen Needle (PEN NEEDLES) 31G X 5 MM MISC Use to give insulin  with flexpen   Insulin  Syringe-Needle U-100 31G X 1/4 1 ML MISC 1 Syringe by Does not apply route 2 (two) times daily.  Multiple Vitamins-Minerals (WOMENS 50+ MULTI VITAMIN/MIN PO) Take by mouth.   pantoprazole  (PROTONIX ) 40 MG tablet Take 1 tablet (40 mg total) by mouth daily. Take 30-60 min before first meal of the day   valsartan (DIOVAN) 160 MG tablet Take 1 tablet (160 mg total) by mouth daily.   losartan  (COZAAR ) 50 MG tablet Take 1 tablet (50 mg total) by mouth daily. (Patient not taking: Reported on 06/02/2024)   No facility-administered encounter medications on file as of 06/02/2024.     Allergies (verified) Metformin  and related, Asa [aspirin], Nsaids, Other, and Tolmetin   History: Past Medical History:  Diagnosis Date   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 Tona, no longer seeing anyone (02/2015)   Cataract    Complication of anesthesia    difficulty breathing after waking up from anesthesia   Crohn's disease (HCC) 2016   diagnosed in 2016 and started on Colazal , but inflammation/ulceration in ileum/cecum dates back to 2006.   Diabetes mellitus without complication (HCC)    Diabetic neuropathy (HCC)    Hyperlipidemia    Hypertension    IBS (irritable bowel syndrome)    Stroke (HCC) 08/12/2009   Syncope and collapse    Thyroid  nodule    Ulcer    Past Surgical History:  Procedure Laterality Date   ABDOMINAL HYSTERECTOMY     partial   BACK SURGERY     x3 (Dr Marcellus, Dr Gust)   BACK SURGERY  03/2022   BREAST CYST EXCISION Right 2002   COLONOSCOPY  2006   RMR: Normal colon except for ileocecal valve, eroded/ulcerated areas at ileocecal valve with mucosal hemorrhage present status post biopsy. ulcer (ileocecal valve, biopsy): Chronic active inflammation with ulceration, nonspecific.   COLONOSCOPY  09/06/2009   RMR: Suboptimal prep on the right side. Ulcers noted at IC valve, base of cecum, and scattered through food part of ascending colon. Pathology wiht chronic active colitis, question Crohn's   COLONOSCOPY N/A 03/27/2015   Dr. Shaaron: abnormal cecum and IC valve most consistent with IBD. TI intubated and appeared normal. Likely Crohn's disease. Patient denies NSAID use. No improvement with Lialda samples at time of initial diagnosis   COLONOSCOPY N/A 05/15/2017   Dr. Shaaron: ab normal IC valve, s/p biopsy with active ileitis, diverticulosis in sigmoid and descending colon. Non-bleeding internal hemorrhoids   ESOPHAGOGASTRODUODENOSCOPY     HAND SURGERY Right    Carpel tunnel and right elbow   LAPAROSCOPIC APPENDECTOMY N/A 07/21/2016    Procedure: APPENDECTOMY LAPAROSCOPIC;  Surgeon: Oneil Budge, MD;  Location: [facility];  Service: General;  Laterality: N/A;   Small bowel capsule endoscopy  06/10/2005   RMR: single erosion versus AVM, distal ileum   Small bowel capsule endoscopy  prior to 05/2005   [place]: reported erosions/ulcerations per medical record. actual report unavailable.   Family History  Problem Relation Age of Onset   Hypertension Mother    Stroke Mother    Diabetes Father        w/ BKA   Hypertension Father    Asthma Father    Cancer Sister    Diabetes Sister        stomach   Breast cancer Sister    Diabetes Sister    Cancer Sister        breast   Hypertension Sister    Breast cancer Daughter    Hypertension Daughter    Breast cancer Daughter    Diabetes Brother    Hypertension Son  Multiple sclerosis Son    Colon cancer Neg Hx    Inflammatory bowel disease Neg Hx    Social History   Socioeconomic History   Marital status: Widowed    Spouse name: Lamar   Number of children: 5   Years of education: 9th grade-then got her GED at [facility]   Highest education level: GED or equivalent  Occupational History   Occupation: retired  Tobacco Use   Smoking status: Never   Smokeless tobacco: Never  Vaping Use   Vaping status: Never Used  Substance and Sexual Activity   Alcohol use: No   Drug use: No   Sexual activity: Not Currently  Other Topics Concern   Not on file  Social History Narrative   Lives home with her husband - he is on dialysis - she helps care for him   Their children live nearby   She babysits her 39 year old grandchild   Social Drivers of Corporate investment banker Strain: Low Risk  (06/02/2024)   Overall Financial Resource Strain (CARDIA)    Difficulty of Paying Living Expenses: Not hard at all  Food Insecurity: No Food Insecurity (06/02/2024)   Hunger Vital Sign    Worried About Running Out of Food in the Last Year: Never true    Ran Out of Food in the Last Year: Never  true  Transportation Needs: No Transportation Needs (06/02/2024)   PRAPARE - Administrator, Civil Service (Medical): No    Lack of Transportation (Non-Medical): No  Physical Activity: Insufficiently Active (06/02/2024)   Exercise Vital Sign    Days of Exercise per Week: 3 days    Minutes of Exercise per Session: 30 min  Stress: No Stress Concern Present (06/02/2024)   [employer] of Occupational Health - Occupational Stress Questionnaire    Feeling of Stress: Not at all  Social Connections: Moderately Isolated (06/03/2023)   Social Connection and Isolation Panel    Frequency of Communication with Friends and Family: More than three times a week    Frequency of Social Gatherings with Friends and Family: More than three times a week    Attends Religious Services: More than 4 times per year    Active Member of [employer] or Organizations: No    Attends Banker Meetings: Never    Marital Status: Widowed    Tobacco Counseling Counseling given: Yes    Clinical Intake:  Pre-visit preparation completed: Yes  Pain : No/denies pain     BMI - recorded: 30.71 Nutritional Status: BMI > 30  Obese Nutritional Risks: None Diabetes: Yes  Lab Results  Component Value Date   HGBA1C 7.3 (H) 03/15/2024   HGBA1C 6.9 (H) 12/12/2023   HGBA1C 8.0 (H) 08/29/2023     How often do you need to have someone help you when you read instructions, pamphlets, or other written materials from your doctor or pharmacy?: 1 - Never  Interpreter Needed?: No  Information entered by :: alia t/cma   Activities of Daily Living     06/02/2024   10:10 AM  In your present state of health, do you have any difficulty performing the following activities:  Hearing? 0  Vision? 0  Difficulty concentrating or making decisions? 1  Walking or climbing stairs? 0  Dressing or bathing? 0  Doing errands, shopping? 0  Comment only in town  Quarry manager and eating ? N  Using the  Toilet? N  In the past six months, have you  accidently leaked urine? N  Do you have problems with loss of bowel control? N  Managing your Medications? N  Managing your Finances? N  Housekeeping or managing your Housekeeping? N    Patient Care Team: Dettinger, Fonda LABOR, MD as PCP - General (Family Medicine) Mona Vinie BROCKS, MD as PCP - Cardiology (Cardiology) Shaaron Lamar HERO, MD as Consulting Physician (Gastroenterology) Shirlean Therisa ORN, NP (Gastroenterology) Carolee Lynwood JINNY DOUGLAS, MD as Consulting Physician (Orthopedic Surgery) Roddie Bring, DPM as Consulting Physician (Podiatry) Bonner Ade, MD as Consulting Physician (Physical Medicine and Rehabilitation) Vicci Mcardle, OD (Optometry)  I have updated your Care Teams any recent Medical Services you may have received from other providers in the past year.     Assessment:   This is a routine wellness examination for Hattie.  Hearing/Vision screen Hearing Screening - Comments:: Pt wear hearing aids Vision Screening - Comments:: Pt wear glasses/pt goes to [facility] [address] in [place], [place]/last ov 01/2024   Goals Addressed             This Visit's Progress    Patient Stated   On track    05/31/2022 AWV Goal: Improved Nutrition/Diet  Patient will verbalize understanding that diet plays an important role in overall health and that a poor diet is a risk factor for many chronic medical conditions.  Over the next year, patient will improve self management of their diet by incorporating better food choices. Patient will utilize available community resources to help with food acquisition if needed (ex: food pantries, [address], etc) Patient will work with nutrition specialist if a referral was made        Depression Screen     06/02/2024   10:11 AM 04/29/2024    9:15 AM 03/15/2024    9:30 AM 12/12/2023    9:43 AM 08/29/2023    9:49 AM 07/04/2023   11:47 AM 06/03/2023   11:10 AM  PHQ 2/9 Scores  PHQ - 2 Score 0 0 0 0 0 0 0  PHQ- 9  Score 1 3    0 0    Fall Risk     06/02/2024    6:58 AM 04/29/2024    9:15 AM 03/15/2024    9:30 AM 12/12/2023    9:43 AM 08/29/2023    9:49 AM  Fall Risk   Falls in the past year? 0 0 0 1 0  Number falls in past yr:  0 0 1 0  Injury with Fall?  0 0 0 0  Risk for fall due to :  Impaired balance/gait No Fall Risks Impaired balance/gait;Impaired mobility No Fall Risks  Follow up Falls evaluation completed;Education provided Falls evaluation completed Falls evaluation completed Falls evaluation completed Falls evaluation completed    MEDICARE RISK AT HOME:  Medicare Risk at Home Any stairs in or around the home?: No If so, are there any without handrails?: No Home free of loose throw rugs in walkways, pet beds, electrical cords, etc?: Yes Adequate lighting in your home to reduce risk of falls?: Yes Life alert?: No Use of a cane, walker or w/c?: Yes Grab bars in the bathroom?: Yes Shower chair or bench in shower?: Yes Elevated toilet seat or a handicapped toilet?: Yes  TIMED UP AND GO:  Was the test performed?  no  Cognitive Function: 6CIT completed    05/19/2018   11:44 AM 05/01/2017   11:09 AM  MMSE - Mini Mental State Exam  Orientation to time 5 5   Orientation  to Place 5 5   Registration 3 3   Attention/ Calculation 4 4   Recall 2 2   Language- name 2 objects 2 2   Language- repeat 1 1  Language- follow 3 step command 3 3   Language- read & follow direction 1 1   Write a sentence 1 1   Copy design 1 1   Total score 28 28      Data saved with a previous flowsheet row definition        06/02/2024   10:10 AM 06/03/2023   11:12 AM 05/31/2022   11:09 AM 05/24/2020   10:40 AM 05/24/2019   10:49 AM  6CIT Screen  What Year? 0 points 0 points 0 points 0 points 0 points  What month? 0 points 0 points 0 points 0 points 0 points  What time? 0 points 0 points 0 points 0 points 0 points  Count back from 20 0 points 0 points 0 points 0 points 0 points  Months in reverse 0  points 0 points 0 points 0 points 0 points  Repeat phrase 0 points 0 points  0 points 0 points  Total Score 0 points 0 points  0 points 0 points    Immunizations Immunization History  Administered Date(s) Administered   Fluad Quad(high Dose 65+) 05/01/2020, 05/04/2021, 07/03/2022   Fluad Trivalent(High Dose 65+) 05/29/2023   INFLUENZA, HIGH DOSE SEASONAL PF 05/19/2018, 04/24/2019, 05/31/2024   Influenza,inj,Quad PF,6+ Mos 05/13/2013, 05/27/2014, 06/22/2015, 06/11/2016, 06/26/2017   Moderna Sars-Covid-2 Vaccination 09/23/2019, 10/26/2019, 06/26/2020, 12/27/2020   Pneumococcal Conjugate-13 07/08/2014   Pneumococcal Polysaccharide-23 12/16/2016   Tdap 12/30/2017   Zoster Recombinant(Shingrix ) 07/14/2019, 12/12/2021    Screening Tests Health Maintenance  Topic Date Due   COVID-19 Vaccine (5 - 2025-26 season) 04/12/2024   Diabetic kidney evaluation - Urine ACR  08/28/2024   HEMOGLOBIN A1C  09/15/2024   OPHTHALMOLOGY EXAM  11/23/2024   Diabetic kidney evaluation - eGFR measurement  03/08/2025   FOOT EXAM  03/15/2025   Medicare Annual Wellness (AWV)  06/02/2025   DEXA SCAN  09/25/2025   DTaP/Tdap/Td (2 - Td or Tdap) 12/31/2027   Pneumococcal Vaccine: 50+ Years  Completed   Influenza Vaccine  Completed   Hepatitis C Screening  Completed   Zoster Vaccines- Shingrix   Completed   Meningococcal B Vaccine  Aged Out   Mammogram  Discontinued   Colonoscopy  Discontinued    Health Maintenance Items Addressed: See Nurse Notes at the end of this note  Additional Screening:  Vision Screening: Recommended annual ophthalmology exams for early detection of glaucoma and other disorders of the eye. Is the patient up to date with their annual eye exam?  Yes  Who is the provider or what is the name of the office in which the patient attends annual eye exams? [facility] in [facility]  Dental Screening: Recommended annual dental exams for proper oral hygiene  Community Resource Referral /  Chronic Care Management: CRR required this visit?  No   CCM required this visit?  No   Plan:    I have personally reviewed and noted the following in the patient's chart:   Medical and social history Use of alcohol, tobacco or illicit drugs  Current medications and supplements including opioid prescriptions. Patient is not currently taking opioid prescriptions. Functional ability and status Nutritional status Physical activity Advanced directives List of other physicians Hospitalizations, surgeries, and ER visits in previous 12 months Vitals Screenings to include cognitive, depression, and falls Referrals  and appointments  In addition, I have reviewed and discussed with patient certain preventive protocols, quality metrics, and best practice recommendations. A written personalized care plan for preventive services as well as general preventive health recommendations were provided to patient.   Ozie Ned, CMA   06/03/2024   After Visit Summary: (MyChart) Due to this being a telephonic visit, the after visit summary with patients personalized plan was offered to patient via MyChart   Notes: During awv pt stated that she has not started the Valsartan med because pt was unsure of the dose and pt's crohn's dis. Pt asked if she should start the med as per pulmonary pcp's order. Per pt pulmonologist's order is to stop the losartan  and to start the valsartan. Suggest pt to do so, per pulmonologist order. suggest pt to monitor her bp per pulmonologist.  If it gets too low for her to cut it in half which per pt is aware. Suggest pt to call the pulmonologist if she starts to have any side-effects to the med ie diarrhea, per pt aggree

## 2024-06-03 NOTE — Therapy (Signed)
 OUTPATIENT PHYSICAL THERAPY LOWER EXTREMITY EVALUATION   Patient Name: Felicia Pugh MRN: [id number] DOB:[birth year], 78 y.o., female Today's Date: 06/03/2024  END OF SESSION:  PT End of Session - 06/03/24 1041     Visit Number 1    Number of Visits 12    Date for Recertification  07/15/24    PT Start Time 1002    PT Stop Time 1054    PT Time Calculation (min) 52 min    Activity Tolerance Patient tolerated treatment well    Behavior During Therapy [facility] for tasks assessed/performed          Past Medical History:  Diagnosis Date   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 Tona, no longer seeing anyone (02/2015)   Cataract    Complication of anesthesia    difficulty breathing after waking up from anesthesia   Crohn's disease (HCC) 2016   diagnosed in 2016 and started on Colazal , but inflammation/ulceration in ileum/cecum dates back to 2006.   Diabetes mellitus without complication (HCC)    Diabetic neuropathy (HCC)    Hyperlipidemia    Hypertension    IBS (irritable bowel syndrome)    Stroke (HCC) 08/12/2009   Syncope and collapse    Thyroid  nodule    Ulcer    Past Surgical History:  Procedure Laterality Date   ABDOMINAL HYSTERECTOMY     partial   BACK SURGERY     x3 (Dr Marcellus, Dr Gust)   BACK SURGERY  03/2022   BREAST CYST EXCISION Right 2002   COLONOSCOPY  2006   RMR: Normal colon except for ileocecal valve, eroded/ulcerated areas at ileocecal valve with mucosal hemorrhage present status post biopsy. ulcer (ileocecal valve, biopsy): Chronic active inflammation with ulceration, nonspecific.   COLONOSCOPY  09/06/2009   RMR: Suboptimal prep on the right side. Ulcers noted at IC valve, base of cecum, and scattered through food part of ascending colon. Pathology wiht chronic active colitis, question Crohn's   COLONOSCOPY N/A 03/27/2015   Dr. Shaaron: abnormal cecum and IC valve most consistent with IBD. TI intubated and appeared normal. Likely Crohn's  disease. Patient denies NSAID use. No improvement with Lialda samples at time of initial diagnosis   COLONOSCOPY N/A 05/15/2017   Dr. Shaaron: ab normal IC valve, s/p biopsy with active ileitis, diverticulosis in sigmoid and descending colon. Non-bleeding internal hemorrhoids   ESOPHAGOGASTRODUODENOSCOPY     HAND SURGERY Right    Carpel tunnel and right elbow   LAPAROSCOPIC APPENDECTOMY N/A 07/21/2016   Procedure: APPENDECTOMY LAPAROSCOPIC;  Surgeon: Oneil Budge, MD;  Location: [facility];  Service: General;  Laterality: N/A;   Small bowel capsule endoscopy  06/10/2005   RMR: single erosion versus AVM, distal ileum   Small bowel capsule endoscopy  prior to 05/2005   [place]: reported erosions/ulcerations per medical record. actual report unavailable.   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, with long-term current use of insulin  (HCC) 12/12/2023   Encounter for long-term (current) use of insulin  (HCC) 02/26/2023   Crohn's disease of both small and large intestine without complication (HCC) 06/07/2021   Loose stools 06/07/2021   Bilateral carpal tunnel syndrome 10/17/2020   Upper airway cough syndrome 05/08/2017   Obesity (BMI 30-39.9) 05/08/2017   Solitary pulmonary nodule 05/08/2017   GERD (gastroesophageal reflux disease) 05/02/2017   Type 2 diabetes mellitus with other specified complication (HCC) 05/16/2015   Crohn's disease  with complication (HCC) 05/16/2015   Diverticulosis of colon without hemorrhage    History of stroke 07/08/2014   Osteopenia 05/11/2014   Hyperlipidemia associated with type 2 diabetes mellitus (HCC) 06/01/2008   Anemia, chronic disease 06/01/2008   Hypertension associated with diabetes (HCC) 06/01/2008   REFERRING PROVIDER: Redell Shoals MD  REFERRING DIAG: Left trochanteric hip bursitis.  THERAPY DIAG:  Pain in left hip - Plan: PT plan of care cert/re-cert  Muscle  weakness (generalized) - Plan: PT plan of care cert/re-cert  Other muscle spasm - Plan: PT plan of care cert/re-cert  Rationale for Evaluation and Treatment: Rehabilitation  ONSET DATE: 'On and off over a year.  SUBJECTIVE:   SUBJECTIVE STATEMENT: The patient presents to the clinic with c/o left hip pain that has been on and off over the last year.  Her pain is rated at a 5-6/10 today.  Walking increases her pain.  Tylenol  decreases her pain.  Injections in the past have provided some relief.    PERTINENT HISTORY: Multiple lumbar surgeries.   PAIN:  Are you having pain? Yes: NPRS scale: 5-/6/10.   Pain location: Left hip. Pain description: Ache, sharp. Aggravating factors: As above.   Relieving factors: As above.    PRECAUTIONS: Fall.  Recommend cane usage.  She has a cane.    RED FLAGS: None   WEIGHT BEARING RESTRICTIONS: No  FALLS:  Has patient fallen in last 6 months? Yes. Number of falls Many.    LIVING ENVIRONMENT: Lives in: House/apartment Stairs: Stairs to enter, Ramped.   Has following equipment at home: None  PLOF: Independent  PATIENT GOALS: Not have pain and be able to Us[employer] at church.     OBJECTIVE:  Note: Objective measures were completed at Evaluation unless otherwise noted.  DIAGNOSTIC FINDINGS: IMPRESSION: 1. Status post fusion and decompression L2-S1, previously L4-S1. No spinal canal stenosis or neural foraminal narrowing. 2. Fluid collection in the surgical bed and soft tissues posterior to L2-L5, measuring up to 7.3 cm, which does not appear to cause mass effect on the thecal sac and is favored to be a postsurgical seroma. 3. Increased T2 signal in the inferior paraspinous muscles, left greater than right, which is nonspecific but can be seen in the setting of myositis or denervation edema. 4. 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  PATIENT SURVEYS:  LEFS:  22/80.    POSTURE:  rounded shoulders, forward head, and flexed trunk   PALPATION: Very TTP over left greater trochanter and left lateral hip musculature with trigger points noted.    LOWER EXTREMITY ROM:  Left hip IR limited to 12 degrees.  LOWER EXTREMITY MMT:  Left hip flexion 3+/5, abd is 4-/5, left knee extension is 4-/5.     GAIT: Slow and purposeful.  Recommended cane usage.  TREATMENT DATE: 06/03/24:   HMP and IFC at 80-150 Hz on 40% scan x 15 minutes to patient's left hip f/b STW/M x 8 minutes to left lateral hip musculature with patient in right sdly position with pillow between knees for comfort.     PATIENT EDUCATION:  Education details: Discussed can usage for safe walking.   Person educated: Patient Education method: Explanation Education comprehension: verbalized understanding  HOME EXERCISE PROGRAM:   ASSESSMENT:  CLINICAL IMPRESSION: The patient presents to the clinic with c/o chronic left hip pain.  She is very TTP over left greater trochanter and left lateral hip musculature with trigger points noted.  She has significant left LE weakness and a decrease in left hip IR.  Her LEFS score is 22/80.  Recommended she use a cane for safe walking.  Patient will benefit from skilled physical therapy intervention to address pain and deficits.   OBJECTIVE IMPAIRMENTS: decreased activity tolerance, decreased ROM, decreased strength, increased muscle spasms, and pain.   ACTIVITY LIMITATIONS: carrying, lifting, bending, standing, and locomotion level  PARTICIPATION LIMITATIONS: meal prep, cleaning, laundry, and community activity  PERSONAL FACTORS: Time since onset of injury/illness/exacerbation and 1 comorbidity: h/o lumbar surgeries.   are also affecting patient's functional outcome.   REHAB POTENTIAL: Good  CLINICAL DECISION MAKING: Evolving/moderate  complexity  EVALUATION COMPLEXITY: Low   GOALS:  SHORT TERM GOALS: Target date: 06/17/24  Ind with an initial HEP. Goal status: INITIAL  LONG TERM GOALS: Target date: 07/15/24  Ind with an advanced HEP.  Goal status: INITIAL  2.  Improve left LE strength by 1 muscle garde to increase stability for functional tasks.    Goal status: INITIAL  3.  Perform ADL's with left hip pain not > 3/10.  Goal status: INITIAL  4.  Improve LEFS score by at least 10 points.  Goal status: INITIAL   PLAN:  PT FREQUENCY: 2x/week  PT DURATION: 6 weeks  PLANNED INTERVENTIONS: 97110-Therapeutic exercises, 97530- Therapeutic activity, [id number]- Neuromuscular re-education, 97535- Self Care, 02859- Manual therapy, [id number]- Electrical stimulation (unattended), 97035- Ultrasound, 79439 (1-2 muscles), 20561 (3+ muscles)- Dry Needling, Patient/Family education, Cryotherapy, and Moist heat  PLAN FOR NEXT SESSION: Combo e'stim/US , STW/M, left LE strengthening.     Brietta Manso, [place], PT 06/03/2024, 11:18 AM

## 2024-06-08 ENCOUNTER — Encounter: Payer: Self-pay | Admitting: Physical Therapy

## 2024-06-08 ENCOUNTER — Ambulatory Visit: Admitting: Physical Therapy

## 2024-06-08 DIAGNOSIS — M6281 Muscle weakness (generalized): Secondary | ICD-10-CM | POA: Diagnosis not present

## 2024-06-08 DIAGNOSIS — M62838 Other muscle spasm: Secondary | ICD-10-CM

## 2024-06-08 DIAGNOSIS — M25552 Pain in left hip: Secondary | ICD-10-CM

## 2024-06-08 NOTE — Therapy (Signed)
 OUTPATIENT PHYSICAL THERAPY LOWER EXTREMITY EVALUATION   Patient Name: Felicia Pugh MRN: [id number] DOB:1945/12/24, 78 y.o., female Today's Date: 06/08/2024  END OF SESSION:  PT End of Session - 06/08/24 1119     Visit Number 2    Number of Visits 12    Date for Recertification  07/15/24    PT Start Time 1045    PT Stop Time 1138    PT Time Calculation (min) 53 min    Activity Tolerance Patient tolerated treatment well    Behavior During Therapy [facility] for tasks assessed/performed          Past Medical History:  Diagnosis Date   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 Tona, no longer seeing anyone (02/2015)   Cataract    Complication of anesthesia    difficulty breathing after waking up from anesthesia   Crohn's disease (HCC) 2016   diagnosed in 2016 and started on Colazal , but inflammation/ulceration in ileum/cecum dates back to 2006.   Diabetes mellitus without complication (HCC)    Diabetic neuropathy (HCC)    Hyperlipidemia    Hypertension    IBS (irritable bowel syndrome)    Stroke (HCC) 08/12/2009   Syncope and collapse    Thyroid  nodule    Ulcer    Past Surgical History:  Procedure Laterality Date   ABDOMINAL HYSTERECTOMY     partial   BACK SURGERY     x3 (Dr Marcellus, Dr Gust)   BACK SURGERY  03/2022   BREAST CYST EXCISION Right 2002   COLONOSCOPY  2006   RMR: Normal colon except for ileocecal valve, eroded/ulcerated areas at ileocecal valve with mucosal hemorrhage present status post biopsy. ulcer (ileocecal valve, biopsy): Chronic active inflammation with ulceration, nonspecific.   COLONOSCOPY  09/06/2009   RMR: Suboptimal prep on the right side. Ulcers noted at IC valve, base of cecum, and scattered through food part of ascending colon. Pathology wiht chronic active colitis, question Crohn's   COLONOSCOPY N/A 03/27/2015   Dr. Shaaron: abnormal cecum and IC valve most consistent with IBD. TI intubated and appeared normal. Likely Crohn's  disease. Patient denies NSAID use. No improvement with Lialda samples at time of initial diagnosis   COLONOSCOPY N/A 05/15/2017   Dr. Shaaron: ab normal IC valve, s/p biopsy with active ileitis, diverticulosis in sigmoid and descending colon. Non-bleeding internal hemorrhoids   ESOPHAGOGASTRODUODENOSCOPY     HAND SURGERY Right    Carpel tunnel and right elbow   LAPAROSCOPIC APPENDECTOMY N/A 07/21/2016   Procedure: APPENDECTOMY LAPAROSCOPIC;  Surgeon: Oneil Budge, MD;  Location: [facility];  Service: General;  Laterality: N/A;   Small bowel capsule endoscopy  06/10/2005   RMR: single erosion versus AVM, distal ileum   Small bowel capsule endoscopy  prior to 05/2005   [place]: reported erosions/ulcerations per medical record. actual report unavailable.   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, with long-term current use of insulin  (HCC) 12/12/2023   Encounter for long-term (current) use of insulin  (HCC) 02/26/2023   Crohn's disease of both small and large intestine without complication (HCC) 06/07/2021   Loose stools 06/07/2021   Bilateral carpal tunnel syndrome 10/17/2020   Upper airway cough syndrome 05/08/2017   Obesity (BMI 30-39.9) 05/08/2017   Solitary pulmonary nodule 05/08/2017   GERD (gastroesophageal reflux disease) 05/02/2017   Type 2 diabetes mellitus with other specified complication (HCC) 05/16/2015   Crohn's disease  with complication (HCC) 05/16/2015   Diverticulosis of colon without hemorrhage    History of stroke 07/08/2014   Osteopenia 05/11/2014   Hyperlipidemia associated with type 2 diabetes mellitus (HCC) 06/01/2008   Anemia, chronic disease 06/01/2008   Hypertension associated with diabetes (HCC) 06/01/2008   REFERRING PROVIDER: Redell Shoals MD  REFERRING DIAG: Left trochanteric hip bursitis.  THERAPY DIAG:  Pain in left hip  Muscle weakness (generalized)  Other muscle  spasm  Rationale for Evaluation and Treatment: Rehabilitation  ONSET DATE: 'On and off over a year.  SUBJECTIVE:   SUBJECTIVE STATEMENT: Last treatment helped.    PERTINENT HISTORY: Multiple lumbar surgeries.   PAIN:  Are you having pain? Yes: NPRS scale: 5-6/10.   Pain location: Left hip. Pain description: Ache, sharp. Aggravating factors: As above.   Relieving factors: As above.    PRECAUTIONS: Fall.  Recommend cane usage.  She has a cane.    RED FLAGS: None   WEIGHT BEARING RESTRICTIONS: No  FALLS:  Has patient fallen in last 6 months? Yes. Number of falls Many.    LIVING ENVIRONMENT: Lives in: House/apartment Stairs: Stairs to enter, Ramped.   Has following equipment at home: None  PLOF: Independent  PATIENT GOALS: Not have pain and be able to Us[employer] at church.     OBJECTIVE:  Note: Objective measures were completed at Evaluation unless otherwise noted.  DIAGNOSTIC FINDINGS: IMPRESSION: 1. Status post fusion and decompression L2-S1, previously L4-S1. No spinal canal stenosis or neural foraminal narrowing. 2. Fluid collection in the surgical bed and soft tissues posterior to L2-L5, measuring up to 7.3 cm, which does not appear to cause mass effect on the thecal sac and is favored to be a postsurgical seroma. 3. Increased T2 signal in the inferior paraspinous muscles, left greater than right, which is nonspecific but can be seen in the setting of myositis or denervation edema. 4. 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  PATIENT SURVEYS:  LEFS:  22/80.    POSTURE: rounded shoulders, forward head, and flexed trunk   PALPATION: Very TTP over left greater trochanter and left lateral hip musculature with trigger points noted.    LOWER EXTREMITY ROM:  Left hip IR limited to 12 degrees.  LOWER EXTREMITY MMT:  Left hip flexion 3+/5, abd is 4-/5, left knee extension is 4-/5.     GAIT: Slow  and purposeful.  Recommended cane usage.                                                                                                                                TREATMENT DATE:  06/08/24:  Nustep level 3 x 10 minutes f/b  f/b STW/M x 14 minutes to left lateral hip musculature with patient in right sdly position with pillow between knees for comfort f/b  HMP and IFC at 80-150 Hz on 40% scan x 15 minutes.  PATIENT EDUCATION:    06/03/24:   HMP and IFC at 80-150 Hz on 40% scan x 15 minutes to patient's left hip f/b STW/M x 8 minutes to left lateral hip musculature with patient in right sdly position with pillow between knees for comfort.     PATIENT EDUCATION:  Education details: Discussed can usage for safe walking.   Person educated: Patient Education method: Explanation Education comprehension: verbalized understanding  HOME EXERCISE PROGRAM:   ASSESSMENT:  CLINICAL IMPRESSION: Patient did well with STW/M she is quite TTP over her left TFL and glut med.  She felt better after treatment.    OBJECTIVE IMPAIRMENTS: decreased activity tolerance, decreased ROM, decreased strength, increased muscle spasms, and pain.   ACTIVITY LIMITATIONS: carrying, lifting, bending, standing, and locomotion level  PARTICIPATION LIMITATIONS: meal prep, cleaning, laundry, and community activity  PERSONAL FACTORS: Time since onset of injury/illness/exacerbation and 1 comorbidity: h/o lumbar surgeries.   are also affecting patient's functional outcome.   REHAB POTENTIAL: Good  CLINICAL DECISION MAKING: Evolving/moderate complexity  EVALUATION COMPLEXITY: Low   GOALS:  SHORT TERM GOALS: Target date: 06/17/24  Ind with an initial HEP. Goal status: INITIAL  LONG TERM GOALS: Target date: 07/15/24  Ind with an advanced HEP.  Goal status: INITIAL  2.  Improve left LE strength by 1 muscle garde to increase stability for functional tasks.    Goal status: INITIAL  3.  Perform ADL's  with left hip pain not > 3/10.  Goal status: INITIAL  4.  Improve LEFS score by at least 10 points.  Goal status: INITIAL   PLAN:  PT FREQUENCY: 2x/week  PT DURATION: 6 weeks  PLANNED INTERVENTIONS: 97110-Therapeutic exercises, 97530- Therapeutic activity, [id number]- Neuromuscular re-education, 97535- Self Care, 02859- Manual therapy, G0283- Electrical stimulation (unattended), 97035- Ultrasound, 79439 (1-2 muscles), 20561 (3+ muscles)- Dry Needling, Patient/Family education, Cryotherapy, and Moist heat  PLAN FOR NEXT SESSION: Combo e'stim/US , STW/M, left LE strengthening.     Jakeb Lamping, PT 06/08/2024, 11:51 AM

## 2024-06-09 ENCOUNTER — Telehealth: Payer: Self-pay

## 2024-06-09 ENCOUNTER — Ambulatory Visit

## 2024-06-09 DIAGNOSIS — M25552 Pain in left hip: Secondary | ICD-10-CM | POA: Diagnosis not present

## 2024-06-09 DIAGNOSIS — M6281 Muscle weakness (generalized): Secondary | ICD-10-CM | POA: Diagnosis not present

## 2024-06-09 DIAGNOSIS — M62838 Other muscle spasm: Secondary | ICD-10-CM | POA: Diagnosis not present

## 2024-06-09 NOTE — Telephone Encounter (Signed)
 Atc x3 regarding labs - returned pt call phone continued to ring. Now I have sent mychart message. I am closing enct per protocol

## 2024-06-09 NOTE — Therapy (Signed)
 OUTPATIENT PHYSICAL THERAPY LOWER EXTREMITY TREATMENT   Patient Name: Felicia Pugh MRN: [id number] DOB:Oct 06, 1945, 78 y.o., female Today's Date: 06/09/2024  END OF SESSION:  PT End of Session - 06/09/24 1020     Visit Number 3    Number of Visits 12    Date for Recertification  07/15/24    PT Start Time 1015    PT Stop Time 1113    PT Time Calculation (min) 58 min    Activity Tolerance Patient tolerated treatment well    Behavior During Therapy [facility] for tasks assessed/performed          Past Medical History:  Diagnosis Date   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 Tona, no longer seeing anyone (02/2015)   Cataract    Complication of anesthesia    difficulty breathing after waking up from anesthesia   Crohn's disease (HCC) 2016   diagnosed in 2016 and started on Colazal , but inflammation/ulceration in ileum/cecum dates back to 2006.   Diabetes mellitus without complication (HCC)    Diabetic neuropathy (HCC)    Hyperlipidemia    Hypertension    IBS (irritable bowel syndrome)    Stroke (HCC) 08/12/2009   Syncope and collapse    Thyroid  nodule    Ulcer    Past Surgical History:  Procedure Laterality Date   ABDOMINAL HYSTERECTOMY     partial   BACK SURGERY     x3 (Dr Marcellus, Dr Gust)   BACK SURGERY  03/2022   BREAST CYST EXCISION Right 2002   COLONOSCOPY  2006   RMR: Normal colon except for ileocecal valve, eroded/ulcerated areas at ileocecal valve with mucosal hemorrhage present status post biopsy. ulcer (ileocecal valve, biopsy): Chronic active inflammation with ulceration, nonspecific.   COLONOSCOPY  09/06/2009   RMR: Suboptimal prep on the right side. Ulcers noted at IC valve, base of cecum, and scattered through food part of ascending colon. Pathology wiht chronic active colitis, question Crohn's   COLONOSCOPY N/A 03/27/2015   Dr. Shaaron: abnormal cecum and IC valve most consistent with IBD. TI intubated and appeared normal. Likely Crohn's  disease. Patient denies NSAID use. No improvement with Lialda samples at time of initial diagnosis   COLONOSCOPY N/A 05/15/2017   Dr. Shaaron: ab normal IC valve, s/p biopsy with active ileitis, diverticulosis in sigmoid and descending colon. Non-bleeding internal hemorrhoids   ESOPHAGOGASTRODUODENOSCOPY     HAND SURGERY Right    Carpel tunnel and right elbow   LAPAROSCOPIC APPENDECTOMY N/A 07/21/2016   Procedure: APPENDECTOMY LAPAROSCOPIC;  Surgeon: Oneil Budge, MD;  Location: [facility];  Service: General;  Laterality: N/A;   Small bowel capsule endoscopy  06/10/2005   RMR: single erosion versus AVM, distal ileum   Small bowel capsule endoscopy  prior to 05/2005   [place]: reported erosions/ulcerations per medical record. actual report unavailable.   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, with long-term current use of insulin  (HCC) 12/12/2023   Encounter for long-term (current) use of insulin  (HCC) 02/26/2023   Crohn's disease of both small and large intestine without complication (HCC) 06/07/2021   Loose stools 06/07/2021   Bilateral carpal tunnel syndrome 10/17/2020   Upper airway cough syndrome 05/08/2017   Obesity (BMI 30-39.9) 05/08/2017   Solitary pulmonary nodule 05/08/2017   GERD (gastroesophageal reflux disease) 05/02/2017   Type 2 diabetes mellitus with other specified complication (HCC) 05/16/2015   Crohn's disease  with complication (HCC) 05/16/2015   Diverticulosis of colon without hemorrhage    History of stroke 07/08/2014   Osteopenia 05/11/2014   Hyperlipidemia associated with type 2 diabetes mellitus (HCC) 06/01/2008   Anemia, chronic disease 06/01/2008   Hypertension associated with diabetes (HCC) 06/01/2008   REFERRING PROVIDER: Redell Shoals MD  REFERRING DIAG: Left trochanteric hip bursitis.  THERAPY DIAG:  Pain in left hip  Muscle weakness (generalized)  Other muscle  spasm  Rationale for Evaluation and Treatment: Rehabilitation  ONSET DATE: 'On and off over a year.  SUBJECTIVE:   SUBJECTIVE STATEMENT: Pt reports 6/10 left hip pain.    PERTINENT HISTORY: Multiple lumbar surgeries.   PAIN:  Are you having pain? Yes: NPRS scale: 6/10.   Pain location: Left hip. Pain description: Ache, sharp. Aggravating factors: As above.   Relieving factors: As above.    PRECAUTIONS: Fall.  Recommend cane usage.  She has a cane.    RED FLAGS: None   WEIGHT BEARING RESTRICTIONS: No  FALLS:  Has patient fallen in last 6 months? Yes. Number of falls Many.    LIVING ENVIRONMENT: Lives in: House/apartment Stairs: Stairs to enter, Ramped.   Has following equipment at home: None  PLOF: Independent  PATIENT GOALS: Not have pain and be able to [employer] at church.     OBJECTIVE:  Note: Objective measures were completed at Evaluation unless otherwise noted.  DIAGNOSTIC FINDINGS: IMPRESSION: 1. Status post fusion and decompression L2-S1, previously L4-S1. No spinal canal stenosis or neural foraminal narrowing. 2. Fluid collection in the surgical bed and soft tissues posterior to L2-L5, measuring up to 7.3 cm, which does not appear to cause mass effect on the thecal sac and is favored to be a postsurgical seroma. 3. Increased T2 signal in the inferior paraspinous muscles, left greater than right, which is nonspecific but can be seen in the setting of myositis or denervation edema. 4. 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  PATIENT SURVEYS:  LEFS:  22/80.    POSTURE: rounded shoulders, forward head, and flexed trunk   PALPATION: Very TTP over left greater trochanter and left lateral hip musculature with trigger points noted.    LOWER EXTREMITY ROM:  Left hip IR limited to 12 degrees.  LOWER EXTREMITY MMT:  Left hip flexion 3+/5, abd is 4-/5, left knee extension is 4-/5.      GAIT: Slow and purposeful.  Recommended cane usage.                                                                                                                                TREATMENT DATE:    06/09/24                                 EXERCISE LOG  Exercise Repetitions and Resistance Comments  Nustep  Lvl 2-3 x 15 mins   LAQs 2# x 20 reps bil   Seated Marches 2# x 20 reps bil   Seated Hip Abduction Red x 2.5 mins   Seated Hip Adduction 2.5 mins   Seated Ham Curls Red x 20 reps bil    Blank cell = exercise not performed today   Manual Therapy Soft Tissue Mobilization: Left hip, STW/M to left hip musculature to decrease pain and tone with pt in right side-lying for comfort   Modalities  Date:  Unattended Estim: Hip, IFC 80-150 Hz, 15 mins, Pain and Tone Hot Pack: Hip, 15 mins, Pain and Tone   06/08/24:  Nustep level 3 x 10 minutes f/b  f/b STW/M x 14 minutes to left lateral hip musculature with patient in right sdly position with pillow between knees for comfort f/b  HMP and IFC at 80-150 Hz on 40% scan x 15 minutes.     PATIENT EDUCATION:    06/03/24:   HMP and IFC at 80-150 Hz on 40% scan x 15 minutes to patient's left hip f/b STW/M x 8 minutes to left lateral hip musculature with patient in right sdly position with pillow between knees for comfort.     PATIENT EDUCATION:  Education details: Discussed can usage for safe walking.   Person educated: Patient Education method: Explanation Education comprehension: verbalized understanding  HOME EXERCISE PROGRAM:   ASSESSMENT:  CLINICAL IMPRESSION: Pt arrives for today's treatment session reporting 6/10 left hip pain.  Pt states that her hip is feeling better since beginning therapy.  Pt introduced to seated BLE exercises today to increase strength and function.  Pt requiring min cues for proper technique and posture.  STW/M performed to left hip musculature to decrease pain and tone.  Normal responses to estim and  MH noted upon removal.  Pt reported decreased left hip pain at completion of today's treatment session.   OBJECTIVE IMPAIRMENTS: decreased activity tolerance, decreased ROM, decreased strength, increased muscle spasms, and pain.   ACTIVITY LIMITATIONS: carrying, lifting, bending, standing, and locomotion level  PARTICIPATION LIMITATIONS: meal prep, cleaning, laundry, and community activity  PERSONAL FACTORS: Time since onset of injury/illness/exacerbation and 1 comorbidity: h/o lumbar surgeries.   are also affecting patient's functional outcome.   REHAB POTENTIAL: Good  CLINICAL DECISION MAKING: Evolving/moderate complexity  EVALUATION COMPLEXITY: Low   GOALS:  SHORT TERM GOALS: Target date: 06/17/24  Ind with an initial HEP. Goal status: INITIAL  LONG TERM GOALS: Target date: 07/15/24  Ind with an advanced HEP.  Goal status: INITIAL  2.  Improve left LE strength by 1 muscle garde to increase stability for functional tasks.    Goal status: INITIAL  3.  Perform ADL's with left hip pain not > 3/10.  Goal status: INITIAL  4.  Improve LEFS score by at least 10 points.  Goal status: INITIAL   PLAN:  PT FREQUENCY: 2x/week  PT DURATION: 6 weeks  PLANNED INTERVENTIONS: 97110-Therapeutic exercises, 97530- Therapeutic activity, [id number]- Neuromuscular re-education, 97535- Self Care, [id number]- Manual therapy, [id number]- Electrical stimulation (unattended), 97035- Ultrasound, 79439 (1-2 muscles), 20561 (3+ muscles)- Dry Needling, Patient/Family education, Cryotherapy, and Moist heat  PLAN FOR NEXT SESSION: Combo e'stim/US , STW/M, left LE strengthening.     Delon DELENA Gosling, PTA 06/09/2024, 11:33 AM

## 2024-06-14 NOTE — Telephone Encounter (Signed)
 Copied from CRM [id number]. Topic: Clinical - Lab/Test Results >> Jun 14, 2024 10:05 AM Joesph PARAS wrote: Reason for CRM: Patient is calling to state she has questions about her lab work, states she has a value that is low. Please return call to patient.  ATC x1. LMTCB  Dr. Darlean can you advise of explanation of lab results.  Pt is aware studies are negative for allergy

## 2024-06-15 ENCOUNTER — Ambulatory Visit: Attending: Orthopedic Surgery | Admitting: Physical Therapy

## 2024-06-15 DIAGNOSIS — M25552 Pain in left hip: Secondary | ICD-10-CM | POA: Diagnosis not present

## 2024-06-15 DIAGNOSIS — M62838 Other muscle spasm: Secondary | ICD-10-CM | POA: Insufficient documentation

## 2024-06-15 DIAGNOSIS — M6281 Muscle weakness (generalized): Secondary | ICD-10-CM | POA: Diagnosis not present

## 2024-06-15 NOTE — Therapy (Signed)
 OUTPATIENT PHYSICAL THERAPY LOWER EXTREMITY TREATMENT   Patient Name: Felicia Pugh MRN: [id number] DOB:November 28, 1945, 78 y.o., female Today's Date: 06/15/2024  END OF SESSION:  PT End of Session - 06/15/24 1113     Visit Number 4    Number of Visits 12    Date for Recertification  07/15/24    PT Start Time 1100    PT Stop Time 1207    PT Time Calculation (min) 67 min    Activity Tolerance Patient tolerated treatment well    Behavior During Therapy [facility] for tasks assessed/performed          Past Medical History:  Diagnosis Date   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 Tona, no longer seeing anyone (02/2015)   Cataract    Complication of anesthesia    difficulty breathing after waking up from anesthesia   Crohn's disease (HCC) 2016   diagnosed in 2016 and started on Colazal , but inflammation/ulceration in ileum/cecum dates back to 2006.   Diabetes mellitus without complication (HCC)    Diabetic neuropathy (HCC)    Hyperlipidemia    Hypertension    IBS (irritable bowel syndrome)    Stroke (HCC) 08/12/2009   Syncope and collapse    Thyroid  nodule    Ulcer    Past Surgical History:  Procedure Laterality Date   ABDOMINAL HYSTERECTOMY     partial   BACK SURGERY     x3 (Dr Marcellus, Dr Gust)   BACK SURGERY  03/2022   BREAST CYST EXCISION Right 2002   COLONOSCOPY  2006   RMR: Normal colon except for ileocecal valve, eroded/ulcerated areas at ileocecal valve with mucosal hemorrhage present status post biopsy. ulcer (ileocecal valve, biopsy): Chronic active inflammation with ulceration, nonspecific.   COLONOSCOPY  09/06/2009   RMR: Suboptimal prep on the right side. Ulcers noted at IC valve, base of cecum, and scattered through food part of ascending colon. Pathology wiht chronic active colitis, question Crohn's   COLONOSCOPY N/A 03/27/2015   Dr. Shaaron: abnormal cecum and IC valve most consistent with IBD. TI intubated and appeared normal. Likely Crohn's  disease. Patient denies NSAID use. No improvement with Lialda samples at time of initial diagnosis   COLONOSCOPY N/A 05/15/2017   Dr. Shaaron: ab normal IC valve, s/p biopsy with active ileitis, diverticulosis in sigmoid and descending colon. Non-bleeding internal hemorrhoids   ESOPHAGOGASTRODUODENOSCOPY     HAND SURGERY Right    Carpel tunnel and right elbow   LAPAROSCOPIC APPENDECTOMY N/A 07/21/2016   Procedure: APPENDECTOMY LAPAROSCOPIC;  Surgeon: Oneil Budge, MD;  Location: [facility];  Service: General;  Laterality: N/A;   Small bowel capsule endoscopy  06/10/2005   RMR: single erosion versus AVM, distal ileum   Small bowel capsule endoscopy  prior to 05/2005   [place]: reported erosions/ulcerations per medical record. actual report unavailable.   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, with long-term current use of insulin  (HCC) 12/12/2023   Encounter for long-term (current) use of insulin  (HCC) 02/26/2023   Crohn's disease of both small and large intestine without complication (HCC) 06/07/2021   Loose stools 06/07/2021   Bilateral carpal tunnel syndrome 10/17/2020   Upper airway cough syndrome 05/08/2017   Obesity (BMI 30-39.9) 05/08/2017   Solitary pulmonary nodule 05/08/2017   GERD (gastroesophageal reflux disease) 05/02/2017   Type 2 diabetes mellitus with other specified complication (HCC) 05/16/2015   Crohn's disease  with complication (HCC) 05/16/2015   Diverticulosis of colon without hemorrhage    History of stroke 07/08/2014   Osteopenia 05/11/2014   Hyperlipidemia associated with type 2 diabetes mellitus (HCC) 06/01/2008   Anemia, chronic disease 06/01/2008   Hypertension associated with diabetes (HCC) 06/01/2008   REFERRING PROVIDER: Redell Shoals MD  REFERRING DIAG: Left trochanteric hip bursitis.  THERAPY DIAG:  Pain in left hip  Muscle weakness (generalized)  Other muscle  spasm  Rationale for Evaluation and Treatment: Rehabilitation  ONSET DATE: 'On and off over a year.  SUBJECTIVE:   SUBJECTIVE STATEMENT: Pt reports 5-6/10 left hip pain.    PERTINENT HISTORY: Multiple lumbar surgeries.   PAIN:  Are you having pain? Yes: NPRS scale: 5-6/10.   Pain location: Left hip. Pain description: Ache, sharp. Aggravating factors: As above.   Relieving factors: As above.    PRECAUTIONS: Fall.  Recommend cane usage.  She has a cane.    RED FLAGS: None   WEIGHT BEARING RESTRICTIONS: No  FALLS:  Has patient fallen in last 6 months? Yes. Number of falls Many.    LIVING ENVIRONMENT: Lives in: House/apartment Stairs: Stairs to enter, Ramped.   Has following equipment at home: None  PLOF: Independent  PATIENT GOALS: Not have pain and be able to [employer] at church.     OBJECTIVE:  Note: Objective measures were completed at Evaluation unless otherwise noted.  DIAGNOSTIC FINDINGS: IMPRESSION: 1. Status post fusion and decompression L2-S1, previously L4-S1. No spinal canal stenosis or neural foraminal narrowing. 2. Fluid collection in the surgical bed and soft tissues posterior to L2-L5, measuring up to 7.3 cm, which does not appear to cause mass effect on the thecal sac and is favored to be a postsurgical seroma. 3. Increased T2 signal in the inferior paraspinous muscles, left greater than right, which is nonspecific but can be seen in the setting of myositis or denervation edema. 4. 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  PATIENT SURVEYS:  LEFS:  22/80.    POSTURE: rounded shoulders, forward head, and flexed trunk   PALPATION: Very TTP over left greater trochanter and left lateral hip musculature with trigger points noted.    LOWER EXTREMITY ROM:  Left hip IR limited to 12 degrees.  LOWER EXTREMITY MMT:  Left hip flexion 3+/5, abd is 4-/5, left knee extension is 4-/5.      GAIT: Slow and purposeful.  Recommended cane usage.                                                                                                                                TREATMENT DATE:  06/15/24:  Nustep level 3 x 15 minutes f/b seated hip abduction resisted with red theraband x 4 minutes f/b STW/M x 19 minutes to left lateral hip musculature with patient in right sdly position with pillow between knees for comfort f/b  HMP and  IFC at 80-150 Hz on 40% scan x 20 minutes. Normal modality resposne following removal of modality.      06/09/24                                 EXERCISE LOG  Exercise Repetitions and Resistance Comments  Nustep Lvl 2-3 x 15 mins   LAQs 2# x 20 reps bil   Seated Marches 2# x 20 reps bil   Seated Hip Abduction Red x 2.5 mins   Seated Hip Adduction 2.5 mins   Seated Ham Curls Red x 20 reps bil    Blank cell = exercise not performed today   Manual Therapy Soft Tissue Mobilization: Left hip, STW/M to left hip musculature to decrease pain and tone with pt in right side-lying for comfort   Modalities  Date:  Unattended Estim: Hip, IFC 80-150 Hz, 15 mins, Pain and Tone Hot Pack: Hip, 15 mins, Pain and Tone   06/08/24:  Nustep level 3 x 10 minutes f/b  f/b STW/M x 14 minutes to left lateral hip musculature with patient in right sdly position with pillow between knees for comfort f/b  HMP and IFC at 80-150 Hz on 40% scan x 15 minutes.     PATIENT EDUCATION:    06/03/24:   HMP and IFC at 80-150 Hz on 40% scan x 15 minutes to patient's left hip f/b STW/M x 8 minutes to left lateral hip musculature with patient in right sdly position with pillow between knees for comfort.     PATIENT EDUCATION:  Education details: Discussed can usage for safe walking.   Person educated: Patient Education method: Explanation Education comprehension: verbalized understanding  HOME EXERCISE PROGRAM:   ASSESSMENT:  CLINICAL IMPRESSION: Patient did very well  with treatment today and she finds STW/M very helpful.  She felt much better after treatment today.    OBJECTIVE IMPAIRMENTS: decreased activity tolerance, decreased ROM, decreased strength, increased muscle spasms, and pain.   ACTIVITY LIMITATIONS: carrying, lifting, bending, standing, and locomotion level  PARTICIPATION LIMITATIONS: meal prep, cleaning, laundry, and community activity  PERSONAL FACTORS: Time since onset of injury/illness/exacerbation and 1 comorbidity: h/o lumbar surgeries.   are also affecting patient's functional outcome.   REHAB POTENTIAL: Good  CLINICAL DECISION MAKING: Evolving/moderate complexity  EVALUATION COMPLEXITY: Low   GOALS:  SHORT TERM GOALS: Target date: 06/17/24  Ind with an initial HEP. Goal status: INITIAL  LONG TERM GOALS: Target date: 07/15/24  Ind with an advanced HEP.  Goal status: INITIAL  2.  Improve left LE strength by 1 muscle garde to increase stability for functional tasks.    Goal status: INITIAL  3.  Perform ADL's with left hip pain not > 3/10.  Goal status: INITIAL  4.  Improve LEFS score by at least 10 points.  Goal status: INITIAL   PLAN:  PT FREQUENCY: 2x/week  PT DURATION: 6 weeks  PLANNED INTERVENTIONS: 97110-Therapeutic exercises, 97530- Therapeutic activity, [id number]- Neuromuscular re-education, 97535- Self Care, 02859- Manual therapy, [id number]- Electrical stimulation (unattended), 97035- Ultrasound, 79439 (1-2 muscles), 20561 (3+ muscles)- Dry Needling, Patient/Family education, Cryotherapy, and Moist heat  PLAN FOR NEXT SESSION: Combo e'stim/US , STW/M, left LE strengthening.     Rodriques Badie, PT 06/15/2024, 12:19 PM

## 2024-06-16 NOTE — Telephone Encounter (Signed)
 Patient is calling again to get her test results.  She does not really use Mychart so will need a phone call again.  Thanks.

## 2024-06-17 ENCOUNTER — Ambulatory Visit: Admitting: Physical Therapy

## 2024-06-17 ENCOUNTER — Encounter: Payer: Self-pay | Admitting: Physical Therapy

## 2024-06-17 DIAGNOSIS — M6281 Muscle weakness (generalized): Secondary | ICD-10-CM

## 2024-06-17 DIAGNOSIS — M25552 Pain in left hip: Secondary | ICD-10-CM | POA: Diagnosis not present

## 2024-06-17 DIAGNOSIS — M62838 Other muscle spasm: Secondary | ICD-10-CM | POA: Diagnosis not present

## 2024-06-17 NOTE — Therapy (Signed)
 OUTPATIENT PHYSICAL THERAPY LOWER EXTREMITY TREATMENT   Patient Name: Felicia Pugh MRN: [id number] DOB:08-18-1945, 78 y.o., female Today's Date: 06/17/2024  END OF SESSION:  PT End of Session - 06/17/24 1101     Visit Number 5    Number of Visits 12    Date for Recertification  07/15/24    PT Start Time 1101    PT Stop Time 1140    PT Time Calculation (min) 39 min    Activity Tolerance Patient tolerated treatment well    Behavior During Therapy [facility] for tasks assessed/performed           Past Medical History:  Diagnosis Date   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 Tona, no longer seeing anyone (02/2015)   Cataract    Complication of anesthesia    difficulty breathing after waking up from anesthesia   Crohn's disease (HCC) 2016   diagnosed in 2016 and started on Colazal , but inflammation/ulceration in ileum/cecum dates back to 2006.   Diabetes mellitus without complication (HCC)    Diabetic neuropathy (HCC)    Hyperlipidemia    Hypertension    IBS (irritable bowel syndrome)    Stroke (HCC) 08/12/2009   Syncope and collapse    Thyroid  nodule    Ulcer    Past Surgical History:  Procedure Laterality Date   ABDOMINAL HYSTERECTOMY     partial   BACK SURGERY     x3 (Dr Marcellus, Dr Gust)   BACK SURGERY  03/2022   BREAST CYST EXCISION Right 2002   COLONOSCOPY  2006   RMR: Normal colon except for ileocecal valve, eroded/ulcerated areas at ileocecal valve with mucosal hemorrhage present status post biopsy. ulcer (ileocecal valve, biopsy): Chronic active inflammation with ulceration, nonspecific.   COLONOSCOPY  09/06/2009   RMR: Suboptimal prep on the right side. Ulcers noted at IC valve, base of cecum, and scattered through food part of ascending colon. Pathology wiht chronic active colitis, question Crohn's   COLONOSCOPY N/A 03/27/2015   Dr. Shaaron: abnormal cecum and IC valve most consistent with IBD. TI intubated and appeared normal. Likely Crohn's  disease. Patient denies NSAID use. No improvement with Lialda samples at time of initial diagnosis   COLONOSCOPY N/A 05/15/2017   Dr. Shaaron: ab normal IC valve, s/p biopsy with active ileitis, diverticulosis in sigmoid and descending colon. Non-bleeding internal hemorrhoids   ESOPHAGOGASTRODUODENOSCOPY     HAND SURGERY Right    Carpel tunnel and right elbow   LAPAROSCOPIC APPENDECTOMY N/A 07/21/2016   Procedure: APPENDECTOMY LAPAROSCOPIC;  Surgeon: Oneil Budge, MD;  Location: [facility];  Service: General;  Laterality: N/A;   Small bowel capsule endoscopy  06/10/2005   RMR: single erosion versus AVM, distal ileum   Small bowel capsule endoscopy  prior to 05/2005   [place]: reported erosions/ulcerations per medical record. actual report unavailable.   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, with long-term current use of insulin  (HCC) 12/12/2023   Encounter for long-term (current) use of insulin  (HCC) 02/26/2023   Crohn's disease of both small and large intestine without complication (HCC) 06/07/2021   Loose stools 06/07/2021   Bilateral carpal tunnel syndrome 10/17/2020   Upper airway cough syndrome 05/08/2017   Obesity (BMI 30-39.9) 05/08/2017   Solitary pulmonary nodule 05/08/2017   GERD (gastroesophageal reflux disease) 05/02/2017   Type 2 diabetes mellitus with other specified complication (HCC) 05/16/2015   Crohn's  disease with complication (HCC) 05/16/2015   Diverticulosis of colon without hemorrhage    History of stroke 07/08/2014   Osteopenia 05/11/2014   Hyperlipidemia associated with type 2 diabetes mellitus (HCC) 06/01/2008   Anemia, chronic disease 06/01/2008   Hypertension associated with diabetes (HCC) 06/01/2008   REFERRING PROVIDER: Redell Shoals MD  REFERRING DIAG: Left trochanteric hip bursitis.  THERAPY DIAG:  Pain in left hip  Muscle weakness (generalized)  Other muscle  spasm  Rationale for Evaluation and Treatment: Rehabilitation  ONSET DATE: 'On and off over a year.  SUBJECTIVE:   SUBJECTIVE STATEMENT: Pt states she's doing better.   PERTINENT HISTORY: Multiple lumbar surgeries.   PAIN:  Are you having pain? Yes: NPRS scale: 5/10.   Pain location: Left hip. Pain description: Ache, sharp. Aggravating factors: As above.   Relieving factors: As above.    PRECAUTIONS: Fall.  Recommend cane usage.  She has a cane.    RED FLAGS: None   WEIGHT BEARING RESTRICTIONS: No  FALLS:  Has patient fallen in last 6 months? Yes. Number of falls Many.    LIVING ENVIRONMENT: Lives in: House/apartment Stairs: Stairs to enter, Ramped.   Has following equipment at home: None  PLOF: Independent  PATIENT GOALS: Not have pain and be able to [employer] at church.     OBJECTIVE:  Note: Objective measures were completed at Evaluation unless otherwise noted.  DIAGNOSTIC FINDINGS: IMPRESSION: 1. Status post fusion and decompression L2-S1, previously L4-S1. No spinal canal stenosis or neural foraminal narrowing. 2. Fluid collection in the surgical bed and soft tissues posterior to L2-L5, measuring up to 7.3 cm, which does not appear to cause mass effect on the thecal sac and is favored to be a postsurgical seroma. 3. Increased T2 signal in the inferior paraspinous muscles, left greater than right, which is nonspecific but can be seen in the setting of myositis or denervation edema. 4. 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  PATIENT SURVEYS:  LEFS:  22/80.    POSTURE: rounded shoulders, forward head, and flexed trunk   PALPATION: Very TTP over left greater trochanter and left lateral hip musculature with trigger points noted.    LOWER EXTREMITY ROM:  Left hip IR limited to 12 degrees.  LOWER EXTREMITY MMT:  Left hip flexion 3+/5, abd is 4-/5, left knee extension is 4-/5.      GAIT: Slow and purposeful.  Recommended cane usage.                                                                                                                                TREATMENT DATE: 06/17/24:   Nustep level 4 x 15 minutes  supine figure 4 stretch x 30 Supine piriformis stretch x 30 Suine LTR x10 Supine clamshell green theraband isometrics 2x10 Supine bridge green TB around thighs 2x10 STW/M and IASTM left lateral hip musculature with patient in  right sdly position with pillow between knees for comfort f/b  HMP and IFC at 80-150 Hz on 40% scan x 15 minutes. Normal modality resposne following removal of modality.  06/15/24:  Nustep level 3 x 15 minutes f/b seated hip abduction resisted with red theraband x 4 minutes f/b STW/M x 19 minutes to left lateral hip musculature with patient in right sdly position with pillow between knees for comfort f/b  HMP and IFC at 80-150 Hz on 40% scan x 20 minutes. Normal modality resposne following removal of modality.      06/09/24                                 EXERCISE LOG  Exercise Repetitions and Resistance Comments  Nustep Lvl 2-3 x 15 mins   LAQs 2# x 20 reps bil   Seated Marches 2# x 20 reps bil   Seated Hip Abduction Red x 2.5 mins   Seated Hip Adduction 2.5 mins   Seated Ham Curls Red x 20 reps bil    Blank cell = exercise not performed today   Manual Therapy Soft Tissue Mobilization: Left hip, STW/M to left hip musculature to decrease pain and tone with pt in right side-lying for comfort   Modalities  Date:  Unattended Estim: Hip, IFC 80-150 Hz, 15 mins, Pain and Tone Hot Pack: Hip, 15 mins, Pain and Tone   06/08/24:  Nustep level 3 x 10 minutes f/b  f/b STW/M x 14 minutes to left lateral hip musculature with patient in right sdly position with pillow between knees for comfort f/b  HMP and IFC at 80-150 Hz on 40% scan x 15 minutes.     PATIENT EDUCATION:     PATIENT EDUCATION:  Education details: Discussed  cane usage for safe walking.   Person educated: Patient Education method: Explanation Education comprehension: verbalized understanding  HOME EXERCISE PROGRAM:   ASSESSMENT:  CLINICAL IMPRESSION: Patient did very well with treatment today and she finds STW/M very helpful.  She felt much better after treatment today.    OBJECTIVE IMPAIRMENTS: decreased activity tolerance, decreased ROM, decreased strength, increased muscle spasms, and pain.   ACTIVITY LIMITATIONS: carrying, lifting, bending, standing, and locomotion level  PARTICIPATION LIMITATIONS: meal prep, cleaning, laundry, and community activity  PERSONAL FACTORS: Time since onset of injury/illness/exacerbation and 1 comorbidity: h/o lumbar surgeries.   are also affecting patient's functional outcome.   REHAB POTENTIAL: Good  CLINICAL DECISION MAKING: Evolving/moderate complexity  EVALUATION COMPLEXITY: Low   GOALS:  SHORT TERM GOALS: Target date: 06/17/24  Ind with an initial HEP. Goal status: INITIAL  LONG TERM GOALS: Target date: 07/15/24  Ind with an advanced HEP.  Goal status: INITIAL  2.  Improve left LE strength by 1 muscle garde to increase stability for functional tasks.    Goal status: INITIAL  3.  Perform ADL's with left hip pain not > 3/10.  Goal status: INITIAL  4.  Improve LEFS score by at least 10 points.  Goal status: INITIAL   PLAN:  PT FREQUENCY: 2x/week  PT DURATION: 6 weeks  PLANNED INTERVENTIONS: 97110-Therapeutic exercises, 97530- Therapeutic activity, [id number]- Neuromuscular re-education, 97535- Self Care, 02859- Manual therapy, G0283- Electrical stimulation (unattended), 97035- Ultrasound, 79439 (1-2 muscles), 20561 (3+ muscles)- Dry Needling, Patient/Family education, Cryotherapy, and Moist heat  PLAN FOR NEXT SESSION: Combo e'stim/US , STW/M, left LE strengthening.     Letha Mirabal April Ma L Jabir Dahlem, PT 06/17/2024, 11:50  AM

## 2024-06-18 ENCOUNTER — Encounter: Payer: Self-pay | Admitting: Internal Medicine

## 2024-06-18 NOTE — Telephone Encounter (Signed)
 Mailing results due to 3 phone calls and no answer

## 2024-06-21 ENCOUNTER — Ambulatory Visit: Admitting: *Deleted

## 2024-06-22 ENCOUNTER — Ambulatory Visit: Attending: Nurse Practitioner | Admitting: Nurse Practitioner

## 2024-06-22 ENCOUNTER — Encounter: Payer: Self-pay | Admitting: Nurse Practitioner

## 2024-06-22 VITALS — BP 159/79 | HR 86 | Ht 62.0 in | Wt 171.0 lb

## 2024-06-22 DIAGNOSIS — I10 Essential (primary) hypertension: Secondary | ICD-10-CM | POA: Diagnosis not present

## 2024-06-22 DIAGNOSIS — R0601 Orthopnea: Secondary | ICD-10-CM | POA: Diagnosis not present

## 2024-06-22 DIAGNOSIS — R0609 Other forms of dyspnea: Secondary | ICD-10-CM

## 2024-06-22 DIAGNOSIS — R0789 Other chest pain: Secondary | ICD-10-CM | POA: Diagnosis not present

## 2024-06-22 DIAGNOSIS — R059 Cough, unspecified: Secondary | ICD-10-CM | POA: Diagnosis not present

## 2024-06-22 DIAGNOSIS — E669 Obesity, unspecified: Secondary | ICD-10-CM | POA: Diagnosis not present

## 2024-06-22 DIAGNOSIS — E785 Hyperlipidemia, unspecified: Secondary | ICD-10-CM

## 2024-06-22 DIAGNOSIS — Z79899 Other long term (current) drug therapy: Secondary | ICD-10-CM

## 2024-06-22 MED ORDER — METOPROLOL TARTRATE 100 MG PO TABS: ORAL_TABLET | ORAL | 0 refills | Status: DC

## 2024-06-22 MED ORDER — VALSARTAN 160 MG PO TABS: 160.0000 mg | ORAL_TABLET | Freq: Two times a day (BID) | ORAL | 3 refills | Status: DC

## 2024-06-22 NOTE — Progress Notes (Unsigned)
 Cardiology Office Note   Date:  04/20/2024 ID:  Matty, Deamer 1946/05/21, MRN [id number] PCP: Dettinger, Fonda LABOR, MD  [facility] Providers Cardiologist:  Vinie JAYSON Maxcy, MD     History of Present Illness Felicia Pugh is a 78 y.o. female with a PMH of chest pain, aortic atherosclerosis, HTN, HLD, T2DM, venous reflux, pulmonary nodule, Crohn's disease, anemia, and GERD, who presents today for 6 week follow-up visit.   ED visit in early March 2025 for intermittent CP, SHOB, and leg edema. CXR unremarkable. LE doppler negative for DVT. Workup negative for ACS. CBG was 35 upon arrival, improved to 100. Was referred to cardiology.  Last seen by Damien Braver, NP-C on October 20, 2023 for evaluation for chest pain and shortness of breath. At the time, she endorsed 3-4 month intermittent chest tightness and SHOB with rest and activity. Also noted intermittent dizziness. Noted some occasional numbness along left hand along with frequent right-sided headaches. Was pending CT scan - nothing acute noted on imaging.   03/04/2024 - Today she presents for follow-up.  She admits to intermittent dyspnea on exertion.  Admits to two-pillow orthopnea, cannot lay flat as she would get short of breath with this. Tells me she sleeps better in her recliner. Also admits to exertional chest pain along her right lower chest, along her arms as well. Has been going on for 1 month, brief in duration, and attributes this to from her activities such as cooking/baking and housework. Does admit to some swelling noted in her feet sometimes, knees are week. Denies any palpitations, syncope, presyncope, dizziness, orthopnea, PND, significant weight changes, acute bleeding, or claudication.  04/20/2024 - Here for follow-up. Doing better since I last saw her in the office. Denies any recurrent chest pain since last visit. Does admit to URI symptoms currently. Also wants to know options for weight loss, however does have hx of  Crohn's and IBS. Denies any chest pain, palpitations, syncope, presyncope, dizziness, orthopnea, PND, swelling or significant weight changes, acute bleeding, or claudication.  FH: Father - heart attack in his 76's, stroke in her mother.   ROS: negative. See HPI.   Studies Reviewed  EKG: EKG is not ordered today.   Echo 03/2024:  1. Left ventricular ejection fraction, by estimation, is 60 to 65%. The  left ventricle has normal function. Left ventricular endocardial border  not optimally defined to evaluate regional wall motion. Left ventricular  diastolic parameters are consistent  with Grade I diastolic dysfunction (impaired relaxation). Elevated left  atrial pressure.   2. Right ventricular systolic function is normal. The right ventricular  size is normal. Tricuspid regurgitation signal is inadequate for assessing  PA pressure.   3. The mitral valve is normal in structure. No evidence of mitral valve  regurgitation. No evidence of mitral stenosis.   4. The aortic valve has an indeterminant number of cusps. There is mild  calcification of the aortic valve. There is mild thickening of the aortic  valve. Aortic valve regurgitation is trivial. No aortic stenosis is  present.   Comparison(s): A prior study was performed on 07/23/2016. EF 65-70%.   Echo 07/2016:  Study Conclusions   - Left ventricle: The cavity size was normal. Wall thickness was    normal. Systolic function was vigorous. The estimated ejection    fraction was in the range of 65% to 70%. Wall motion was normal;    there were no regional wall motion abnormalities. Doppler    parameters  are consistent with abnormal left ventricular    relaxation (grade 1 diastolic dysfunction).  - Aortic valve: Mildly calcified annulus. Trileaflet; mildly    thickened leaflets. Valve area (VTI): 3 cm^2. Valve area (Vmax):    2.48 cm^2.  - Systemic veins: IVC is small, suggesting low RA pressure and    hypovolemia.  - Technically  adequate study.  Physical Exam VS:  There were no vitals taken for this visit.       Wt Readings from Last 3 Encounters:  06/02/24 168 lb (76.2 kg)  05/24/24 168 lb 12.8 oz (76.6 kg)  04/29/24 169 lb (76.7 kg)    GEN: Well nourished, well developed in no acute distress NECK: No JVD; No carotid bruits CARDIAC: S1/S2, RRR, no murmurs, rubs, gallops RESPIRATORY:  Clear to auscultation without rales, wheezing or rhonchi  ABDOMEN: Soft, non-tender, non-distended EXTREMITIES:  No edema; No deformity   ASSESSMENT AND PLAN  1. DOE Currently admits to some URI symptoms. No adventitious breath sounds heard on exam.  Does admit to intermittent dyspnea on exertion.  Recent Echo does not explain her symptoms.  Recommended she follow-up with PCP to treat her URI symptoms and if symptoms continue to persist by next office visit, plan to consider ischemic evaluation.  2. Fatigue Admits to staying tired. Continue to follow with PCP for further evaluation.   3. HLD Most recent LDL 87.  Continue to follow with PCP who is managing this. Continue Lipitor. Heart healthy diet and regular cardiovascular exercise encouraged.   4. URI Admits to symptoms that sound like upper respiratory infection.  I recommend she follow-up with PCP for further evaluation.  No indication for chest x-ray at this time as no adventitious breath sounds are heard on exam.  5. Obesity Did discuss lifestyle changes. Heart healthy diet and regular cardiovascular exercise encouraged. Plan to discuss further at next office visit once URI symptoms are resolved.    Dispo: Follow-up with MD/APP in 8 weeks or sooner any changes.  Signed, Almarie Crate, NP

## 2024-06-22 NOTE — Patient Instructions (Addendum)
 Medication Instructions:    INCREASE Valsartan to 160 mg TWICE a day  Take Lopressor 100 mg 2 hours before the CT  Labwork: BMET at [facility]   Testing/Procedures:   Your cardiac CT will be scheduled at one of the below locations:   [facility] [address]  [place], [place] [address]   If scheduled at the [facility] at [address], please enter the parking lot using the [address] entrance and use the FREE valet service at the patient drop-off area. Enter the building and check-in with registration on the main floor.   Please follow these instructions carefully (unless otherwise directed):  An IV will be required for this test and Nitroglycerin will be given.  Hold all erectile dysfunction medications at least 3 days (72 hrs) prior to test. (Ie viagra, cialis, sildenafil, tadalafil, etc)   On the Night Before the Test: Be sure to Drink plenty of water . Do not consume any caffeinated/decaffeinated beverages or chocolate 12 hours prior to your test. Do not take any antihistamines 12 hours prior to your test.   On the Day of the Test: Drink plenty of water  until 1 hour prior to the test. Do not eat any food 1 hour prior to test. You may take your regular medications prior to the test.  Take metoprolol (Lopressor) two hours prior to test. If you take Furosemide /Hydrochlorothiazide /Spironolactone/Chlorthalidone, please HOLD on the morning of the test. Patients who wear a continuous glucose monitor MUST remove the device prior to scanning. FEMALES- please wear underwire-free bra if available, avoid dresses & tight clothing       After the Test: Drink plenty of water . After receiving IV contrast, you may experience a mild flushed feeling. This is normal. On occasion, you may experience a mild rash up to 24 hours after the test. This is not dangerous. If this occurs, you can take Benadryl  25 mg, Zyrtec, Claritin , or Allegra  and increase your fluid intake. (Patients taking Tikosyn should avoid Benadryl , and may take Zyrtec, Claritin , or Allegra) If you experience trouble breathing, this can be serious. If it is severe call 911 IMMEDIATELY. If it is mild, please call our office.  We will call to schedule your test 2-4 weeks out understanding that some insurance companies will need an authorization prior to the service being performed.   For more information and frequently asked questions, please visit our website : [email or web address]  For non-scheduling related questions, please contact the cardiac imaging nurse navigator should you have any questions/concerns: Cardiac Imaging Nurse Navigators Direct Office Dial: [phone]   For scheduling needs, including cancellations and rescheduling, please call Brittany, [phone].   Follow-Up: 6-8 weeks with E.Peck,NP  Any Other Special Instructions Will Be Listed Below (If Applicable).  If you need a refill on your cardiac medications before your next appointment, please call your pharmacy.

## 2024-06-23 ENCOUNTER — Ambulatory Visit: Admitting: Physical Therapy

## 2024-06-23 DIAGNOSIS — M6281 Muscle weakness (generalized): Secondary | ICD-10-CM

## 2024-06-23 DIAGNOSIS — M25552 Pain in left hip: Secondary | ICD-10-CM | POA: Diagnosis not present

## 2024-06-23 DIAGNOSIS — M62838 Other muscle spasm: Secondary | ICD-10-CM | POA: Diagnosis not present

## 2024-06-23 NOTE — Therapy (Signed)
 OUTPATIENT PHYSICAL THERAPY LOWER EXTREMITY TREATMENT   Patient Name: Felicia Pugh MRN: [id number] DOB:1945/08/23, 78 y.o., female Today's Date: 06/23/2024  END OF SESSION:  PT End of Session - 06/23/24 1158     Visit Number 6    Number of Visits 12    Date for Recertification  07/15/24    PT Start Time 1015    PT Stop Time 1110    PT Time Calculation (min) 55 min    Activity Tolerance Patient tolerated treatment well    Behavior During Therapy [facility] for tasks assessed/performed           Past Medical History:  Diagnosis Date   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 Tona, no longer seeing anyone (02/2015)   Cataract    Complication of anesthesia    difficulty breathing after waking up from anesthesia   Crohn's disease (HCC) 2016   diagnosed in 2016 and started on Colazal , but inflammation/ulceration in ileum/cecum dates back to 2006.   Diabetes mellitus without complication (HCC)    Diabetic neuropathy (HCC)    Hyperlipidemia    Hypertension    IBS (irritable bowel syndrome)    Stroke (HCC) 08/12/2009   Syncope and collapse    Thyroid  nodule    Ulcer    Past Surgical History:  Procedure Laterality Date   ABDOMINAL HYSTERECTOMY     partial   BACK SURGERY     x3 (Dr Marcellus, Dr Gust)   BACK SURGERY  03/2022   BREAST CYST EXCISION Right 2002   COLONOSCOPY  2006   RMR: Normal colon except for ileocecal valve, eroded/ulcerated areas at ileocecal valve with mucosal hemorrhage present status post biopsy. ulcer (ileocecal valve, biopsy): Chronic active inflammation with ulceration, nonspecific.   COLONOSCOPY  09/06/2009   RMR: Suboptimal prep on the right side. Ulcers noted at IC valve, base of cecum, and scattered through food part of ascending colon. Pathology wiht chronic active colitis, question Crohn's   COLONOSCOPY N/A 03/27/2015   Dr. Shaaron: abnormal cecum and IC valve most consistent with IBD. TI intubated and appeared normal. Likely Crohn's  disease. Patient denies NSAID use. No improvement with Lialda samples at time of initial diagnosis   COLONOSCOPY N/A 05/15/2017   Dr. Shaaron: ab normal IC valve, s/p biopsy with active ileitis, diverticulosis in sigmoid and descending colon. Non-bleeding internal hemorrhoids   ESOPHAGOGASTRODUODENOSCOPY     HAND SURGERY Right    Carpel tunnel and right elbow   LAPAROSCOPIC APPENDECTOMY N/A 07/21/2016   Procedure: APPENDECTOMY LAPAROSCOPIC;  Surgeon: Oneil Budge, MD;  Location: [facility];  Service: General;  Laterality: N/A;   Small bowel capsule endoscopy  06/10/2005   RMR: single erosion versus AVM, distal ileum   Small bowel capsule endoscopy  prior to 05/2005   [place]: reported erosions/ulcerations per medical record. actual report unavailable.   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, with long-term current use of insulin  (HCC) 12/12/2023   Encounter for long-term (current) use of insulin  (HCC) 02/26/2023   Crohn's disease of both small and large intestine without complication (HCC) 06/07/2021   Loose stools 06/07/2021   Bilateral carpal tunnel syndrome 10/17/2020   Upper airway cough syndrome 05/08/2017   Obesity (BMI 30-39.9) 05/08/2017   Solitary pulmonary nodule 05/08/2017   GERD (gastroesophageal reflux disease) 05/02/2017   Type 2 diabetes mellitus with other specified complication (HCC) 05/16/2015   Crohn's  disease with complication (HCC) 05/16/2015   Diverticulosis of colon without hemorrhage    History of stroke 07/08/2014   Osteopenia 05/11/2014   Hyperlipidemia associated with type 2 diabetes mellitus (HCC) 06/01/2008   Anemia, chronic disease 06/01/2008   Hypertension associated with diabetes (HCC) 06/01/2008   REFERRING PROVIDER: Redell Shoals MD  REFERRING DIAG: Left trochanteric hip bursitis.  THERAPY DIAG:  No diagnosis found.  Rationale for Evaluation and Treatment:  Rehabilitation  ONSET DATE: 'On and off over a year.  SUBJECTIVE:   SUBJECTIVE STATEMENT: Pain down to a 4/10.  I'm doing a whole lot better.  PERTINENT HISTORY: Multiple lumbar surgeries.   PAIN:  Are you having pain? Yes: NPRS scale: 4/10.   Pain location: Left hip. Pain description: Ache, sharp. Aggravating factors: As above.   Relieving factors: As above.    PRECAUTIONS: Fall.  Recommend cane usage.  She has a cane.    RED FLAGS: None   WEIGHT BEARING RESTRICTIONS: No  FALLS:  Has patient fallen in last 6 months? Yes. Number of falls Many.    LIVING ENVIRONMENT: Lives in: House/apartment Stairs: Stairs to enter, Ramped.   Has following equipment at home: None  PLOF: Independent  PATIENT GOALS: Not have pain and be able to [employer] at church.     OBJECTIVE:  Note: Objective measures were completed at Evaluation unless otherwise noted.  DIAGNOSTIC FINDINGS: IMPRESSION: 1. Status post fusion and decompression L2-S1, previously L4-S1. No spinal canal stenosis or neural foraminal narrowing. 2. Fluid collection in the surgical bed and soft tissues posterior to L2-L5, measuring up to 7.3 cm, which does not appear to cause mass effect on the thecal sac and is favored to be a postsurgical seroma. 3. Increased T2 signal in the inferior paraspinous muscles, left greater than right, which is nonspecific but can be seen in the setting of myositis or denervation edema. 4. 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  PATIENT SURVEYS:  LEFS:  22/80.    POSTURE: rounded shoulders, forward head, and flexed trunk   PALPATION: Very TTP over left greater trochanter and left lateral hip musculature with trigger points noted.    LOWER EXTREMITY ROM:  Left hip IR limited to 12 degrees.  LOWER EXTREMITY MMT:  Left hip flexion 3+/5, abd is 4-/5, left knee extension is 4-/5.     GAIT: Slow and purposeful.   Recommended cane usage.                                                                                                                                TREATMENT DATE:  06/23/24:  Nustep level 3 LE's only x 15 minutes f/b STW/M x 8 minutes with ischemic release technique to left TFL f/b HMP and IFC at 80-150 Hz on 40% scan x 20 minutes.  Normal modality resposne following removal of modality.   06/17/24:   Laurene  level 4 x 15 minutes  supine figure 4 stretch x 30 Supine piriformis stretch x 30 Suine LTR x10 Supine clamshell green theraband isometrics 2x10 Supine bridge green TB around thighs 2x10 STW/M and IASTM left lateral hip musculature with patient in right sdly position with pillow between knees for comfort f/b  HMP and IFC at 80-150 Hz on 40% scan x 15 minutes. Normal modality resposne following removal of modality.     PATIENT EDUCATION:     PATIENT EDUCATION:  Education details: Discussed cane usage for safe walking.   Person educated: Patient Education method: Explanation Education comprehension: verbalized understanding  HOME EXERCISE PROGRAM:   ASSESSMENT:  CLINICAL IMPRESSION: Patient's left TFL remains tender but she is doing well with less pain.    OBJECTIVE IMPAIRMENTS: decreased activity tolerance, decreased ROM, decreased strength, increased muscle spasms, and pain.   ACTIVITY LIMITATIONS: carrying, lifting, bending, standing, and locomotion level  PARTICIPATION LIMITATIONS: meal prep, cleaning, laundry, and community activity  PERSONAL FACTORS: Time since onset of injury/illness/exacerbation and 1 comorbidity: h/o lumbar surgeries.   are also affecting patient's functional outcome.   REHAB POTENTIAL: Good  CLINICAL DECISION MAKING: Evolving/moderate complexity  EVALUATION COMPLEXITY: Low   GOALS:  SHORT TERM GOALS: Target date: 06/17/24  Ind with an initial HEP. Goal status: INITIAL  LONG TERM GOALS: Target date: 07/15/24  Ind with an  advanced HEP.  Goal status: INITIAL  2.  Improve left LE strength by 1 muscle garde to increase stability for functional tasks.    Goal status: INITIAL  3.  Perform ADL's with left hip pain not > 3/10.  Goal status: INITIAL  4.  Improve LEFS score by at least 10 points.  Goal status: INITIAL   PLAN:  PT FREQUENCY: 2x/week  PT DURATION: 6 weeks  PLANNED INTERVENTIONS: 97110-Therapeutic exercises, 97530- Therapeutic activity, [id number]- Neuromuscular re-education, 97535- Self Care, [id number]- Manual therapy, [id number]- Electrical stimulation (unattended), 97035- Ultrasound, 79439 (1-2 muscles), 20561 (3+ muscles)- Dry Needling, Patient/Family education, Cryotherapy, and Moist heat  PLAN FOR NEXT SESSION: Combo e'stim/US , STW/M, left LE strengthening.     Dontavian Marchi, PT 06/23/2024, 12:08 PM

## 2024-06-24 ENCOUNTER — Ambulatory Visit: Admitting: Physical Therapy

## 2024-06-24 DIAGNOSIS — M6281 Muscle weakness (generalized): Secondary | ICD-10-CM | POA: Diagnosis not present

## 2024-06-24 DIAGNOSIS — M62838 Other muscle spasm: Secondary | ICD-10-CM

## 2024-06-24 DIAGNOSIS — M25552 Pain in left hip: Secondary | ICD-10-CM

## 2024-06-24 LAB — BASIC METABOLIC PANEL WITH GFR
BUN/Creatinine Ratio: 17 (ref 12–28)
BUN: 12 mg/dL (ref 8–27)
CO2: 26 mmol/L (ref 20–29)
Calcium: 9.2 mg/dL (ref 8.7–10.3)
Chloride: 102 mmol/L (ref 96–106)
Creatinine, Ser: 0.72 mg/dL (ref 0.57–1.00)
Glucose: 119 mg/dL — ABNORMAL HIGH (ref 70–99)
Potassium: 3.7 mmol/L (ref 3.5–5.2)
Sodium: 142 mmol/L (ref 134–144)
eGFR: 86 mL/min/1.73 (ref >59)

## 2024-06-24 NOTE — Therapy (Signed)
 OUTPATIENT PHYSICAL THERAPY LOWER EXTREMITY TREATMENT   Patient Name: Felicia Pugh MRN: [id number] DOB:[birth year], 78 y.o., female Today's Date: 06/24/2024  END OF SESSION:  PT End of Session - 06/24/24 1711     Visit Number 7    Number of Visits 12    Date for Recertification  07/15/24    PT Start Time 0230    PT Stop Time 0327    PT Time Calculation (min) 57 min    Activity Tolerance Patient tolerated treatment well    Behavior During Therapy [facility] for tasks assessed/performed            Past Medical History:  Diagnosis Date   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 Tona, no longer seeing anyone (02/2015)   Cataract    Complication of anesthesia    difficulty breathing after waking up from anesthesia   Crohn's disease (HCC) 2016   diagnosed in 2016 and started on Colazal , but inflammation/ulceration in ileum/cecum dates back to 2006.   Diabetes mellitus without complication (HCC)    Diabetic neuropathy (HCC)    Hyperlipidemia    Hypertension    IBS (irritable bowel syndrome)    Stroke (HCC) 08/12/2009   Syncope and collapse    Thyroid  nodule    Ulcer    Past Surgical History:  Procedure Laterality Date   ABDOMINAL HYSTERECTOMY     partial   BACK SURGERY     x3 (Dr Marcellus, Dr Gust)   BACK SURGERY  03/2022   BREAST CYST EXCISION Right 2002   COLONOSCOPY  2006   RMR: Normal colon except for ileocecal valve, eroded/ulcerated areas at ileocecal valve with mucosal hemorrhage present status post biopsy. ulcer (ileocecal valve, biopsy): Chronic active inflammation with ulceration, nonspecific.   COLONOSCOPY  09/06/2009   RMR: Suboptimal prep on the right side. Ulcers noted at IC valve, base of cecum, and scattered through food part of ascending colon. Pathology wiht chronic active colitis, question Crohn's   COLONOSCOPY N/A 03/27/2015   Dr. Shaaron: abnormal cecum and IC valve most consistent with IBD. TI intubated and appeared normal. Likely Crohn's  disease. Patient denies NSAID use. No improvement with Lialda samples at time of initial diagnosis   COLONOSCOPY N/A 05/15/2017   Dr. Shaaron: ab normal IC valve, s/p biopsy with active ileitis, diverticulosis in sigmoid and descending colon. Non-bleeding internal hemorrhoids   ESOPHAGOGASTRODUODENOSCOPY     HAND SURGERY Right    Carpel tunnel and right elbow   LAPAROSCOPIC APPENDECTOMY N/A 07/21/2016   Procedure: APPENDECTOMY LAPAROSCOPIC;  Surgeon: Oneil Budge, MD;  Location: [facility];  Service: General;  Laterality: N/A;   Small bowel capsule endoscopy  06/10/2005   RMR: single erosion versus AVM, distal ileum   Small bowel capsule endoscopy  prior to 05/2005   [place]: reported erosions/ulcerations per medical record. actual report unavailable.   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, with long-term current use of insulin  (HCC) 12/12/2023   Encounter for long-term (current) use of insulin  (HCC) 02/26/2023   Crohn's disease of both small and large intestine without complication (HCC) 06/07/2021   Loose stools 06/07/2021   Bilateral carpal tunnel syndrome 10/17/2020   Upper airway cough syndrome 05/08/2017   Obesity (BMI 30-39.9) 05/08/2017   Solitary pulmonary nodule 05/08/2017   GERD (gastroesophageal reflux disease) 05/02/2017   Type 2 diabetes mellitus with other specified complication (HCC) 05/16/2015  Crohn's disease with complication (HCC) 05/16/2015   Diverticulosis of colon without hemorrhage    History of stroke 07/08/2014   Osteopenia 05/11/2014   Hyperlipidemia associated with type 2 diabetes mellitus (HCC) 06/01/2008   Anemia, chronic disease 06/01/2008   Hypertension associated with diabetes (HCC) 06/01/2008   REFERRING PROVIDER: Redell Shoals MD  REFERRING DIAG: Left trochanteric hip bursitis.  THERAPY DIAG:  Pain in left hip  Muscle weakness (generalized)  Other muscle  spasm  Rationale for Evaluation and Treatment: Rehabilitation  ONSET DATE: 'On and off over a year.  SUBJECTIVE:   SUBJECTIVE STATEMENT: Pain down to a 3/10.   PERTINENT HISTORY: Multiple lumbar surgeries.   PAIN:  Are you having pain? Yes: NPRS scale: 3/10.   Pain location: Left hip. Pain description: Ache, sharp. Aggravating factors: As above.   Relieving factors: As above.    PRECAUTIONS: Fall.  Recommend cane usage.  She has a cane.    RED FLAGS: None   WEIGHT BEARING RESTRICTIONS: No  FALLS:  Has patient fallen in last 6 months? Yes. Number of falls Many.    LIVING ENVIRONMENT: Lives in: House/apartment Stairs: Stairs to enter, Ramped.   Has following equipment at home: None  PLOF: Independent  PATIENT GOALS: Not have pain and be able to Us[employer] at church.     OBJECTIVE:  Note: Objective measures were completed at Evaluation unless otherwise noted.  DIAGNOSTIC FINDINGS: IMPRESSION: 1. Status post fusion and decompression L2-S1, previously L4-S1. No spinal canal stenosis or neural foraminal narrowing. 2. Fluid collection in the surgical bed and soft tissues posterior to L2-L5, measuring up to 7.3 cm, which does not appear to cause mass effect on the thecal sac and is favored to be a postsurgical seroma. 3. Increased T2 signal in the inferior paraspinous muscles, left greater than right, which is nonspecific but can be seen in the setting of myositis or denervation edema. 4. 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  PATIENT SURVEYS:  LEFS:  22/80.    POSTURE: rounded shoulders, forward head, and flexed trunk   PALPATION: Very TTP over left greater trochanter and left lateral hip musculature with trigger points noted.    LOWER EXTREMITY ROM:  Left hip IR limited to 12 degrees.  LOWER EXTREMITY MMT:  Left hip flexion 3+/5, abd is 4-/5, left knee extension is 4-/5.     GAIT: Slow and  purposeful.  Recommended cane usage.                                                                                                                                TREATMENT DATE:  06/23/24:  Nustep level 3 LE's only x 15 minutes f/b supine hip abduction with red theraband x 2 minutes f/b sdly clamshell STW/M x 10 minutes with ischemic release technique to left TFL f/b HMP and IFC at 80-150 Hz on 40% scan x  20 minutes.  Normal modality resposne following removal of modality.   06/17/24:   Nustep level 4 x 15 minutes  supine figure 4 stretch x 30 Supine piriformis stretch x 30 Suine LTR x10 Supine clamshell green theraband isometrics 2x10 Supine bridge green TB around thighs 2x10 STW/M and IASTM left lateral hip musculature with patient in right sdly position with pillow between knees for comfort f/b  HMP and IFC at 80-150 Hz on 40% scan x 15 minutes. Normal modality resposne following removal of modality.     PATIENT EDUCATION:     PATIENT EDUCATION:  Education details: see below. Person educated: Patient Education method: Explanation Education comprehension: verbalized understanding  HOME EXERCISE PROGRAM: Resisted hip abduction with red theraband.  ASSESSMENT:  CLINICAL IMPRESSION: Patient continues to report a reduction in her pain.  She was provided with red theraband for hip abd exercise.    OBJECTIVE IMPAIRMENTS: decreased activity tolerance, decreased ROM, decreased strength, increased muscle spasms, and pain.   ACTIVITY LIMITATIONS: carrying, lifting, bending, standing, and locomotion level  PARTICIPATION LIMITATIONS: meal prep, cleaning, laundry, and community activity  PERSONAL FACTORS: Time since onset of injury/illness/exacerbation and 1 comorbidity: h/o lumbar surgeries.   are also affecting patient's functional outcome.   REHAB POTENTIAL: Good  CLINICAL DECISION MAKING: Evolving/moderate complexity  EVALUATION COMPLEXITY: Low   GOALS:  SHORT  TERM GOALS: Target date: 06/17/24  Ind with an initial HEP. Goal status: INITIAL  LONG TERM GOALS: Target date: 07/15/24  Ind with an advanced HEP.  Goal status: INITIAL  2.  Improve left LE strength by 1 muscle garde to increase stability for functional tasks.    Goal status: INITIAL  3.  Perform ADL's with left hip pain not > 3/10.  Goal status: INITIAL  4.  Improve LEFS score by at least 10 points.  Goal status: INITIAL   PLAN:  PT FREQUENCY: 2x/week  PT DURATION: 6 weeks  PLANNED INTERVENTIONS: 97110-Therapeutic exercises, 97530- Therapeutic activity, [id number]- Neuromuscular re-education, 97535- Self Care, 02859- Manual therapy, [id number]- Electrical stimulation (unattended), 97035- Ultrasound, 79439 (1-2 muscles), 20561 (3+ muscles)- Dry Needling, Patient/Family education, Cryotherapy, and Moist heat  PLAN FOR NEXT SESSION: Combo e'stim/US , STW/M, left LE strengthening.     Hicks Feick, PT 06/24/2024, 5:20 PM

## 2024-06-25 ENCOUNTER — Telehealth: Payer: Self-pay | Admitting: *Deleted

## 2024-06-25 ENCOUNTER — Ambulatory Visit: Payer: Self-pay | Admitting: Nurse Practitioner

## 2024-06-25 MED ORDER — VALSARTAN 160 MG PO TABS: 160.0000 mg | ORAL_TABLET | Freq: Every day | ORAL | Status: AC

## 2024-06-25 NOTE — Telephone Encounter (Signed)
 Patient notified and verbalized understanding.

## 2024-06-25 NOTE — Telephone Encounter (Signed)
 Patient recently seen on 06/22/24 by Almarie Crate, NP.  Her Valsartan was increased to 160mg  twice a day.  BP at office visit was 159/79.    11/12 - prior to am meds - 123/68  84             after am meds - 110/57  87  11/13 - 8:30 am - prior - 111/69  78             after am meds -  107/57  68   This morning around 9:15 - 106/81  93 - has not taken her am meds yet.  States she is noticing some slight light headedness since making this change & is concerned whether she should continue or not.

## 2024-06-25 NOTE — Telephone Encounter (Signed)
-----   Message from Almarie Crate sent at 06/25/2024  9:00 AM EST ----- Labs look good.   Almarie Crate, NP

## 2024-06-25 NOTE — Telephone Encounter (Signed)
Patient notified and verbalized understanding.  Copy to pcp.   

## 2024-06-28 ENCOUNTER — Encounter: Payer: Self-pay | Admitting: Family Medicine

## 2024-06-28 ENCOUNTER — Ambulatory Visit: Admitting: Family Medicine

## 2024-06-28 VITALS — BP 123/73 | HR 83 | Ht 62.0 in | Wt 170.0 lb

## 2024-06-28 DIAGNOSIS — E1169 Type 2 diabetes mellitus with other specified complication: Secondary | ICD-10-CM

## 2024-06-28 DIAGNOSIS — E1159 Type 2 diabetes mellitus with other circulatory complications: Secondary | ICD-10-CM | POA: Diagnosis not present

## 2024-06-28 DIAGNOSIS — Z794 Long term (current) use of insulin: Secondary | ICD-10-CM

## 2024-06-28 DIAGNOSIS — I152 Hypertension secondary to endocrine disorders: Secondary | ICD-10-CM | POA: Diagnosis not present

## 2024-06-28 DIAGNOSIS — E785 Hyperlipidemia, unspecified: Secondary | ICD-10-CM

## 2024-06-28 LAB — BAYER DCA HB A1C WAIVED: HB A1C (BAYER DCA - WAIVED): 7.1 % — ABNORMAL HIGH (ref 4.8–5.6)

## 2024-06-28 MED ORDER — ONETOUCH VERIO VI STRP: ORAL_STRIP | 3 refills | Status: AC

## 2024-06-28 NOTE — Progress Notes (Signed)
 BP 123/73   Pulse 83   Ht 5' 2 (1.575 m)   Wt 170 lb (77.1 kg)   SpO2 99%   BMI 31.09 kg/m    Subjective:   Patient ID: Felicia Pugh, female    DOB: [birth year], 78 y.o.   MRN: [id number]  HPI: Felicia Pugh is a 78 y.o. female presenting on 06/28/2024 for Medical Management of Chronic Issues and Diabetes   Discussed the use of AI scribe software for clinical note transcription with the patient, who gave verbal consent to proceed.  History of Present Illness   Felicia Pugh why-oh-me is a 78 year old female with diabetes who presents for a recheck of her blood sugar levels.  Glycemic control - Blood glucose levels fluctuate between 86 and 226 mg/dL - Elevated blood glucose of 226 mg/dL occurred after consuming ice cream and cake - Morning blood glucose levels stable at 86 and 92 mg/dL - Hemoglobin J8r improved from 7.3% to 7.1% - Episodes of hypoglycemia with blood glucose below 75 mg/dL occurred four to five times in the past two weeks - Hypoglycemic episodes attributed to earlier dinner (by 6 PM) and lighter evening snacks - Currently taking Gluxambi and Tresiba  insulin  at a dose of 40 units  Fatigue - Intermittent tiredness - Occasionally consumes cookies when feeling tired - Inquires about sugar content in energy drinks  Peripheral edema - Swelling present in the left leg  Arthralgia - Arthritis pain in the left hand - Occasional use of Voltaren gel for symptom relief  Hyperlipidemia - Currently taking Lipitor for cholesterol management  Cognitive function - Currently taking Prevagen for memory - Perceives benefit from Prevagen          Relevant past medical, surgical, family and social history reviewed and updated as indicated. Interim medical history since our last visit reviewed. Allergies and medications reviewed and updated.  Review of Systems  Constitutional:  Negative for chills and fever.  Eyes:  Negative for visual disturbance.   Respiratory:  Negative for chest tightness and shortness of breath.   Cardiovascular:  Negative for chest pain and leg swelling.  Genitourinary:  Negative for difficulty urinating and dysuria.  Musculoskeletal:  Positive for arthralgias. Negative for back pain and gait problem.  Skin:  Negative for rash.  Neurological:  Negative for light-headedness and headaches.  Psychiatric/Behavioral:  Negative for agitation and behavioral problems.   All other systems reviewed and are negative.   Per HPI unless specifically indicated above   Allergies as of 06/28/2024       Reactions   Metformin  And Related    Severe diarrhea   Asa [aspirin]    GI Dr does not want her to take due to hx ulcers   Nsaids Other (See Comments)   GI Dr does not want her to take due to hx of ulcers Instructed by GI not to take    Other Diarrhea   Steroids   Tolmetin Other (See Comments)   GI Dr does not want her to take due to hx of ulcers GI Dr does not want her to take due to hx of ulcers        Medication List        Accurate as of June 28, 2024 10:38 AM. If you have any questions, ask your nurse or doctor.          STOP taking these medications    metoprolol tartrate 100 MG tablet Commonly known as: Lopressor Stopped  by: Fonda LABOR Mathea Frieling       TAKE these medications    acetaminophen  650 MG CR tablet Commonly known as: TYLENOL  Take 650 mg by mouth every 8 (eight) hours as needed for pain.   atorvastatin  40 MG tablet Commonly known as: LIPITOR Take 1 tablet (40 mg total) by mouth daily.   balsalazide 750 MG capsule Commonly known as: COLAZAL  TAKE 2 CAPSULES BY MOUTH IN THE MORNING AND AT BEDTIME   benzonatate  200 MG capsule Commonly known as: TESSALON  Take 1 capsule (200 mg total) by mouth 3 (three) times daily as needed for cough.   famotidine 20 MG tablet Commonly known as: Pepcid One after supper   fluticasone  50 MCG/ACT nasal spray Commonly known as: FLONASE  Place  1 spray into both nostrils 2 (two) times daily as needed for allergies or rhinitis.   Glyxambi  25-5 MG Tabs Generic drug: Empagliflozin -linaGLIPtin  Take 1 tablet by mouth daily.   Insulin  Syringe-Needle U-100 31G X 1/4 1 ML Misc 1 Syringe by Does not apply route 2 (two) times daily.   OneTouch Verio test strip Generic drug: glucose blood CHECK GLUCOSE TWICE DAILY Dx E11.69   pantoprazole  40 MG tablet Commonly known as: Protonix  Take 1 tablet (40 mg total) by mouth daily. Take 30-60 min before first meal of the day   Pen Needles 31G X 5 MM Misc Use to give insulin  with flexpen   Prevagen 10 MG Caps Generic drug: Apoaequorin Take by mouth.   Tresiba  FlexTouch 100 UNIT/ML FlexTouch Pen Generic drug: insulin  degludec INJECT 40 UNITS SUBCUTANEOUSLY ONCE DAILY   valsartan 160 MG tablet Commonly known as: Diovan Take 1 tablet (160 mg total) by mouth daily.   WOMENS 50+ MULTI VITAMIN/MIN PO Take by mouth.         Objective:   BP 123/73   Pulse 83   Ht 5' 2 (1.575 m)   Wt 170 lb (77.1 kg)   SpO2 99%   BMI 31.09 kg/m   Wt Readings from Last 3 Encounters:  06/28/24 170 lb (77.1 kg)  06/22/24 171 lb (77.6 kg)  06/02/24 168 lb (76.2 kg)    Physical Exam Physical Exam   CHEST: Lungs clear to auscultation bilaterally. CARDIOVASCULAR: Heart regular rate and rhythm, no murmurs. EXTREMITIES: Swelling on left leg.  1+        Assessment & Plan:   Problem List Items Addressed This Visit       Cardiovascular and Mediastinum   Hypertension associated with diabetes (HCC)     Endocrine   Hyperlipidemia associated with type 2 diabetes mellitus (HCC)   Type 2 diabetes mellitus with other specified complication (HCC) - Primary   Relevant Orders   Bayer DCA Hb A1c Waived       Type 2 diabetes mellitus with recurrent hypoglycemia Blood glucose fluctuates with morning hypoglycemia. A1c improved to 7.1. Current insulin  regimen causes frequent hypoglycemia. - Reduced  Tresiba  to 38 units daily. - Monitor blood glucose, especially in the morning. - Avoid high-sugar foods and drinks. - Report further hypoglycemic episodes.  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 - Use Voltaren gel on the left hand as needed.  Left leg swelling Swelling in the left leg causing discomfort.       Having some hypoglycemic episode, we will have her lower the Tresiba  down to just 38 units instead of 40 and let me know if she still has hypoglycemic episodes.  Follow up plan: Return in about  3 months (around 09/28/2024), or if symptoms worsen or fail to improve, for Diabetes recheck.  Counseling provided for all of the vaccine components Orders Placed This Encounter  Procedures   Bayer DCA Hb A1c Waived    Fonda Levins, MD [facility] 06/28/2024, 10:38 AM

## 2024-06-29 ENCOUNTER — Encounter: Payer: Self-pay | Admitting: Internal Medicine

## 2024-06-29 ENCOUNTER — Ambulatory Visit: Admitting: Physical Therapy

## 2024-06-29 ENCOUNTER — Ambulatory Visit (INDEPENDENT_AMBULATORY_CARE_PROVIDER_SITE_OTHER): Admitting: Internal Medicine

## 2024-06-29 VITALS — BP 130/69 | HR 95 | Ht 62.0 in | Wt 169.8 lb

## 2024-06-29 DIAGNOSIS — R058 Other specified cough: Secondary | ICD-10-CM | POA: Diagnosis not present

## 2024-06-29 DIAGNOSIS — R0609 Other forms of dyspnea: Secondary | ICD-10-CM

## 2024-06-29 NOTE — Progress Notes (Unsigned)
 Patient ID: Felicia Pugh, female   DOB: [birth year]    MRN: [id number]  HPI  50 yobf  Never smoker with new onset sob/hoarseness > ER eval > 5mm nodule  RML on CT 03/17/17 and ent/thyroid  eval / GI Eval > started PPI x one week prior to OV   but no better so referred to pulmonary clinic 05/07/2017 by Dr   Dettinger    05/07/2017 1st [facility] office visit/ Josilynn Losh   Chief Complaint  Patient presents with   Pulmonary Consult    Referred by Dr. Maryanne.  Pt c/o SOB x 2 months- occurs with exertion such as walking to her mailbox and back. She also get SOB when she lies down.  She also c/o cough that comes and goes-  mostly non prod   variable mostly dry cough / sob, globus sensation x 2 months, indolent onset daily >> noct symptoms with neg w/u to date while on ACEi / feels choked when lies down but fine once asleep no change on gerd rx to date but only been a week Rec Protononix (pantoprazole ) 40  Mg Take 30- 60 min before your first and last meals of the day  Stop lisinopril  and start losartan  100-25 one daily in its place GERD rx    05/24/2024 re-establish  - consult Dr Dettinger ov/[place] office/Horace Lukas re: cough / sob / hoarseness  x Feb 2025 maint on no  resp rx and no longer on gerd rx   Chief Complaint  Patient presents with   Establish Care    Coughing - shob   Dyspnea:  [employer]  Cough: most productive no pattern  daytime > noct and slt beige assoc with pnds / sneezing  Sleeping: in recliner x July 2024  30 degrees s resp cc  SABA use: none 02: none  Rec Patient Instructions  Stop losartan  and try valsartan 160 mg one half tablet daily and adjust upward if needed to one daily  Tessalon  pearls 200 mg up to 4 x daily if needed with goal to stop coughing completely  Pantoprazole  (protonix ) 40 mg   Take  30-60 min before first meal of the day and Pepcid (famotidine)  20 mg after supper until return to office - this is the best way to tell whether stomach acid  is contributing to your problem.   GERD diet reviewed, bed blocks rec  Depomedrol 120 mg IM  You need to see your orthopedist about your arm pain which I suspect carpal tunnel syndrome   Allergy screen 05/24/2024 >  Eos 0.1 /  IgE  50     06/29/2024  f/u ov/[place] office/Terry Bolotin re: : cough / sob / hoarseness  x Feb 2025 maint on gerd rx   Chief Complaint  Patient presents with   Cough    shob  Dyspnea:  [employer] shopping ok but pace is slow  Cough: dry /daytime Sleeping: recliner 10 degrees s    resp cc  SABA use: none  02: none     No obvious day to day or daytime variability or assoc excess/ purulent sputum or mucus plugs or hemoptysis or cp or chest tightness, subjective wheeze or overt sinus or hb symptoms.    Also denies any obvious fluctuation of symptoms with weather or environmental changes or other aggravating or alleviating factors except as outlined above   No unusual exposure hx or h/o childhood pna/ asthma or knowledge of premature birth.  Current  Allergies, Complete Past Medical History, Past Surgical History, Family History, and Social History were reviewed in [employer] record.  ROS  The following are not active complaints unless bolded Hoarseness, sore throat, dysphagia, dental problems, itching, sneezing,  nasal congestion or discharge of excess mucus or purulent secretions, ear ache,   fever, chills, sweats, unintended wt loss or wt gain, classically pleuritic or exertional cp,  orthopnea pnd or arm/hand swelling  or leg swelling, presyncope, palpitations, abdominal pain, anorexia, nausea, vomiting, diarrhea  or change in bowel habits or change in bladder habits, change in stools or change in urine, dysuria, hematuria,  rash, arthralgias, visual complaints, headache, numbness, weakness or ataxia or problems with walking or coordination,  change in mood or  memory.        Current Meds  Medication Sig   acetaminophen  (TYLENOL ) 650 MG CR  tablet Take 650 mg by mouth every 8 (eight) hours as needed for pain.   Apoaequorin (PREVAGEN) 10 MG CAPS Take by mouth.   atorvastatin  (LIPITOR) 40 MG tablet Take 1 tablet (40 mg total) by mouth daily.   balsalazide (COLAZAL ) 750 MG capsule TAKE 2 CAPSULES BY MOUTH IN THE MORNING AND AT BEDTIME   benzonatate  (TESSALON ) 200 MG capsule Take 1 capsule (200 mg total) by mouth 3 (three) times daily as needed for cough.   Empagliflozin -linaGLIPtin  (GLYXAMBI ) 25-5 MG TABS Take 1 tablet by mouth daily.   famotidine (PEPCID) 20 MG tablet One after supper   fluticasone  (FLONASE ) 50 MCG/ACT nasal spray Place 1 spray into both nostrils 2 (two) times daily as needed for allergies or rhinitis.   glucose blood (ONETOUCH VERIO) test strip CHECK GLUCOSE TWICE DAILY Dx E11.69   insulin  degludec (TRESIBA  FLEXTOUCH) 100 UNIT/ML FlexTouch Pen INJECT 40 UNITS SUBCUTANEOUSLY ONCE DAILY   Insulin  Pen Needle (PEN NEEDLES) 31G X 5 MM MISC Use to give insulin  with flexpen   Insulin  Syringe-Needle U-100 31G X 1/4 1 ML MISC 1 Syringe by Does not apply route 2 (two) times daily.   Multiple Vitamins-Minerals (WOMENS 50+ MULTI VITAMIN/MIN PO) Take by mouth.   pantoprazole  (PROTONIX ) 40 MG tablet Take 1 tablet (40 mg total) by mouth daily. Take 30-60 min before first meal of the day   valsartan (DIOVAN) 160 MG tablet Take 1 tablet (160 mg total) by mouth daily.          Current Meds  Medication Sig   acetaminophen  (TYLENOL ) 650 MG CR tablet Take 650 mg by mouth every 8 (eight) hours as needed for pain.   Apoaequorin (PREVAGEN) 10 MG CAPS Take by mouth.   atorvastatin  (LIPITOR) 40 MG tablet Take 1 tablet (40 mg total) by mouth daily.   balsalazide (COLAZAL ) 750 MG capsule TAKE 2 CAPSULES BY MOUTH IN THE MORNING AND AT BEDTIME   Empagliflozin -linaGLIPtin  (GLYXAMBI ) 25-5 MG TABS Take 1 tablet by mouth daily.   fluticasone  (FLONASE ) 50 MCG/ACT nasal spray Place 1 spray into both nostrils 2 (two) times daily as needed for  allergies or rhinitis.   glucose blood (ONETOUCH VERIO) test strip CHECK GLUCOSE TWICE DAILY Dx E11.69   insulin  degludec (TRESIBA  FLEXTOUCH) 100 UNIT/ML FlexTouch Pen INJECT 40 UNITS SUBCUTANEOUSLY ONCE DAILY   Insulin  Pen Needle (PEN NEEDLES) 31G X 5 MM MISC Use to give insulin  with flexpen   Insulin  Syringe-Needle U-100 31G X 1/4 1 ML MISC 1 Syringe by Does not apply route 2 (two) times daily.   losartan  (COZAAR ) 50 MG tablet Take 1 tablet (50 mg  total) by mouth daily.   Multiple Vitamins-Minerals (WOMENS 50+ MULTI VITAMIN/MIN PO) Take by mouth.                Objective:   Physical Exam      06/29/2024    169   05/07/17 180 lb (81.6 kg)  05/02/17 179 lb 9.6 oz (81.5 kg)  05/01/17 187 lb (84.8 kg)     Vital signs reviewed  06/29/2024  - Note at rest 02 sats  95% on RA   General appearance:    amb somber  bf classic voice fatigue and freq throat clearing    HEENT : Oropharynx  clear      Nasal turbinates nl / ear canals nl    NECK :  without  apparent JVD/ palpable Nodes/TM    LUNGS: no acc muscle use,  Nl contour chest which is clear to A and P bilaterally without cough on insp or exp maneuvers   CV:  RRR  no s3 or murmur or increase in P2, and no edema   ABD:  soft and nontender   MS:  Gait nl   ext warm without deformities Or obvious joint restrictions  calf tenderness, cyanosis or clubbing    SKIN: warm and dry without lesions    NEURO:  alert, approp, nl sensorium with  no motor or cerebellar deficits apparent.       I personally reviewed images and agree with radiology impression as follows:  CXR:   pa and lateral   04/29/24  1. No acute findings. 2. Persistent elevation of the left hemidiaphragm.(Since 02/2000, the 1st cxr report in [facility])/ 3. Lumbar fusion hardware.         Assessment:       Assessment & Plan Upper airway cough syndrome Trial off acei 05/07/2017 and off losartan  50 05/24/2024  - Allergy screen 05/24/2024 >  Eos 0.1 /  IgE  50 -  max gerd rx 05/24/2024  and off losaratan >>> no better 06/29/2024  >>>   refer back to ENT for globus/ throat clearing   Comment:  Upper airway cough syndrome (previously labeled PNDS),  is so named because it's frequently impossible to sort out how much is  CR/sinusitis with freq throat clearing (which can be related to primary GERD)   vs  causing  secondary ( extra esophageal)  GERD from wide swings in gastric pressure that occur with throat clearing, often  promoting self use of mint and menthol lozenges that reduce the lower esophageal sphincter tone and exacerbate the problem further in a cyclical fashion.   These are the same pts (now being labeled as having irritable larynx syndrome by some cough centers) who not infrequently have a history of having failed to tolerate ace inhibitors,  dry powder inhalers or biphosphonates or report having atypical/extraesophageal reflux symptoms from LPR (globus, throat clearing as is the case here)  that don't respond to standard doses of PPI  and are easily confused as having aecopd or asthma flares by even experienced allergists/ pulmonologists (myself included).   >>> if neg ent eval consider adding gabapentin  for irritable larynx   DOE (dyspnea on exertion) Assoc with upper airway cough since feb 2025  - 06/29/2024   Walked on RA  x  3   lap(s) =  approx 450   ft  @ mod to fast  pace, stopped due to end of study  with lowest 02 sats 86%  - CT chest due 07/07/24 with  coronary study >>>  No evidence at all to support airways dz for either cough or doe complaints but she could have occult ILD > await CT chest and if abnormal then return for PFTs  - in meantime rec slower paced walking which has been her baseline anyway for months to years.   Discussed in detail all the  indications, usual  risks and alternatives  relative to the benefits with patient who agrees to proceed with w/u as outlined.     Each maintenance medication was reviewed in detail  including emphasizing most importantly the difference between maintenance and prns and under what circumstances the prns are to be triggered using an action plan format where appropriate.  Total time for H and P, chart review, counseling, reviewing pulse ox  device(s) , directly observing portions of ambulatory 02 saturation study/ and generating customized AVS unique to this office visit / same day charting = 40 min  for multiple chronic  refractory respiratory  symptoms of uncertain etiology                  AVS  Patient Instructions  I  recommend you return to your ENT to discuss the hoarseness that you have when you talk a lot  - this is called voice fatigue and is not normal and can be associated with chronic cough as well  I can refer you to the voice center at [facility] for a second opinion if your would like - Dr Garnette Silvan   I will review your CT chest when you have your cardiac  study to decide on what the next step is - in the meantime try hard rock candy to reduce urge to clear throat but no mint/ menthol or chocolate products     Latia Mataya, MD 06/30/2024

## 2024-06-29 NOTE — Patient Instructions (Addendum)
 I  recommend you return to your ENT to discuss the hoarseness that you have when you talk a lot  - this is called voice fatigue and is not normal and can be associated with chronic cough as well  I can refer you to the voice center at [facility] for a second opinion if your would like - Dr Garnette Silvan   I will review your CT chest when you have your cardiac  study to decide on what the next step is - in the meantime try hard rock candy to reduce urge to clear throat but no mint/ menthol or chocolate products

## 2024-06-30 ENCOUNTER — Ambulatory Visit: Admitting: Physical Therapy

## 2024-06-30 DIAGNOSIS — M25552 Pain in left hip: Secondary | ICD-10-CM | POA: Diagnosis not present

## 2024-06-30 DIAGNOSIS — M6281 Muscle weakness (generalized): Secondary | ICD-10-CM | POA: Diagnosis not present

## 2024-06-30 DIAGNOSIS — M62838 Other muscle spasm: Secondary | ICD-10-CM

## 2024-06-30 DIAGNOSIS — R0609 Other forms of dyspnea: Secondary | ICD-10-CM | POA: Insufficient documentation

## 2024-06-30 NOTE — Therapy (Signed)
 OUTPATIENT PHYSICAL THERAPY LOWER EXTREMITY TREATMENT   Patient Name: Felicia Pugh MRN: [id number] DOB:1946-01-30, 78 y.o., female Today's Date: 06/30/2024  END OF SESSION:  PT End of Session - 06/30/24 1630     Visit Number 8    Number of Visits 12    Date for Recertification  07/15/24    PT Start Time 0230    PT Stop Time 0327    PT Time Calculation (min) 57 min    Activity Tolerance Patient tolerated treatment well    Behavior During Therapy [facility] for tasks assessed/performed             Past Medical History:  Diagnosis Date   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 Tona, no longer seeing anyone (02/2015)   Cataract    Complication of anesthesia    difficulty breathing after waking up from anesthesia   Crohn's disease (HCC) 2016   diagnosed in 2016 and started on Colazal , but inflammation/ulceration in ileum/cecum dates back to 2006.   Diabetes mellitus without complication (HCC)    Diabetic neuropathy (HCC)    Hyperlipidemia    Hypertension    IBS (irritable bowel syndrome)    Stroke (HCC) 08/12/2009   Syncope and collapse    Thyroid  nodule    Ulcer    Past Surgical History:  Procedure Laterality Date   ABDOMINAL HYSTERECTOMY     partial   BACK SURGERY     x3 (Dr Marcellus, Dr Gust)   BACK SURGERY  03/2022   BREAST CYST EXCISION Right 2002   COLONOSCOPY  2006   RMR: Normal colon except for ileocecal valve, eroded/ulcerated areas at ileocecal valve with mucosal hemorrhage present status post biopsy. ulcer (ileocecal valve, biopsy): Chronic active inflammation with ulceration, nonspecific.   COLONOSCOPY  09/06/2009   RMR: Suboptimal prep on the right side. Ulcers noted at IC valve, base of cecum, and scattered through food part of ascending colon. Pathology wiht chronic active colitis, question Crohn's   COLONOSCOPY N/A 03/27/2015   Dr. Shaaron: abnormal cecum and IC valve most consistent with IBD. TI intubated and appeared normal. Likely Crohn's  disease. Patient denies NSAID use. No improvement with Lialda samples at time of initial diagnosis   COLONOSCOPY N/A 05/15/2017   Dr. Shaaron: ab normal IC valve, s/p biopsy with active ileitis, diverticulosis in sigmoid and descending colon. Non-bleeding internal hemorrhoids   ESOPHAGOGASTRODUODENOSCOPY     HAND SURGERY Right    Carpel tunnel and right elbow   LAPAROSCOPIC APPENDECTOMY N/A 07/21/2016   Procedure: APPENDECTOMY LAPAROSCOPIC;  Surgeon: Oneil Budge, MD;  Location: [facility];  Service: General;  Laterality: N/A;   Small bowel capsule endoscopy  06/10/2005   RMR: single erosion versus AVM, distal ileum   Small bowel capsule endoscopy  prior to 05/2005   [place]: reported erosions/ulcerations per medical record. actual report unavailable.   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, with long-term current use of insulin  (HCC) 12/12/2023   Encounter for long-term (current) use of insulin  (HCC) 02/26/2023   Crohn's disease of both small and large intestine without complication (HCC) 06/07/2021   Loose stools 06/07/2021   Bilateral carpal tunnel syndrome 10/17/2020   Upper airway cough syndrome 05/08/2017   Obesity (BMI 30-39.9) 05/08/2017   Solitary pulmonary nodule 05/08/2017   GERD (gastroesophageal reflux disease) 05/02/2017   Type 2 diabetes mellitus  with other specified complication (HCC) 05/16/2015   Crohn's disease with complication (HCC) 05/16/2015   Diverticulosis of colon without hemorrhage    History of stroke 07/08/2014   Osteopenia 05/11/2014   Hyperlipidemia associated with type 2 diabetes mellitus (HCC) 06/01/2008   Anemia, chronic disease 06/01/2008   Hypertension associated with diabetes (HCC) 06/01/2008   REFERRING PROVIDER: Redell Shoals MD  REFERRING DIAG: Left trochanteric hip bursitis.  THERAPY DIAG:  Pain in left hip  Muscle  weakness (generalized)  Other muscle spasm  Rationale for Evaluation and Treatment: Rehabilitation  ONSET DATE: 'On and off over a year.  SUBJECTIVE:   SUBJECTIVE STATEMENT: Pain about a 3.    PERTINENT HISTORY: Multiple lumbar surgeries.   PAIN:  Are you having pain? Yes: NPRS scale: 3/10.   Pain location: Left hip. Pain description: Ache, sharp. Aggravating factors: As above.   Relieving factors: As above.    PRECAUTIONS: Fall.  Recommend cane usage.  She has a cane.    RED FLAGS: None   WEIGHT BEARING RESTRICTIONS: No  FALLS:  Has patient fallen in last 6 months? Yes. Number of falls Many.    LIVING ENVIRONMENT: Lives in: House/apartment Stairs: Stairs to enter, Ramped.   Has following equipment at home: None  PLOF: Independent  PATIENT GOALS: Not have pain and be able to Us[employer] at church.     OBJECTIVE:  Note: Objective measures were completed at Evaluation unless otherwise noted.  DIAGNOSTIC FINDINGS: IMPRESSION: 1. Status post fusion and decompression L2-S1, previously L4-S1. No spinal canal stenosis or neural foraminal narrowing. 2. Fluid collection in the surgical bed and soft tissues posterior to L2-L5, measuring up to 7.3 cm, which does not appear to cause mass effect on the thecal sac and is favored to be a postsurgical seroma. 3. Increased T2 signal in the inferior paraspinous muscles, left greater than right, which is nonspecific but can be seen in the setting of myositis or denervation edema. 4. 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  PATIENT SURVEYS:  LEFS:  22/80.    POSTURE: rounded shoulders, forward head, and flexed trunk   PALPATION: Very TTP over left greater trochanter and left lateral hip musculature with trigger points noted.    LOWER EXTREMITY ROM:  Left hip IR limited to 12 degrees.  LOWER EXTREMITY MMT:  Left hip flexion 3+/5, abd is 4-/5, left knee extension  is 4-/5.     GAIT: Slow and purposeful.  Recommended cane usage.                                                                                                                                TREATMENT DATE:  06/30/24:  Nustep level 4 x 15 minutes f/b STW/M x 23 minutes with patient in right sdly position with folded pillow between knees for comfort f/b HMP and IFC at 80-150 Hz on 40% scan x  15 minutes.  Normal modality resposne following removal of modality.    06/23/24:  Nustep level 3 LE's only x 15 minutes f/b supine hip abduction with red theraband x 2 minutes f/b sdly clamshell STW/M x 10 minutes with ischemic release technique to left TFL f/b HMP and IFC at 80-150 Hz on 40% scan x 20 minutes.  Normal modality resposne following removal of modality.   06/17/24:   Nustep level 4 x 15 minutes  supine figure 4 stretch x 30 Supine piriformis stretch x 30 Suine LTR x10 Supine clamshell green theraband isometrics 2x10 Supine bridge green TB around thighs 2x10 STW/M and IASTM left lateral hip musculature with patient in right sdly position with pillow between knees for comfort f/b  HMP and IFC at 80-150 Hz on 40% scan x 15 minutes. Normal modality resposne following removal of modality.     PATIENT EDUCATION:     PATIENT EDUCATION:  Education details: see below. Person educated: Patient Education method: Explanation Education comprehension: verbalized understanding  HOME EXERCISE PROGRAM: Resisted hip abduction with red theraband.  ASSESSMENT:  CLINICAL IMPRESSION: Patient continues to report a consistent reduction in her pain.  She did great with her session today and felt very good following.    OBJECTIVE IMPAIRMENTS: decreased activity tolerance, decreased ROM, decreased strength, increased muscle spasms, and pain.   ACTIVITY LIMITATIONS: carrying, lifting, bending, standing, and locomotion level  PARTICIPATION LIMITATIONS: meal prep, cleaning, laundry, and  community activity  PERSONAL FACTORS: Time since onset of injury/illness/exacerbation and 1 comorbidity: h/o lumbar surgeries.   are also affecting patient's functional outcome.   REHAB POTENTIAL: Good  CLINICAL DECISION MAKING: Evolving/moderate complexity  EVALUATION COMPLEXITY: Low   GOALS:  SHORT TERM GOALS: Target date: 06/17/24  Ind with an initial HEP. Goal status: INITIAL  LONG TERM GOALS: Target date: 07/15/24  Ind with an advanced HEP.  Goal status: INITIAL  2.  Improve left LE strength by 1 muscle garde to increase stability for functional tasks.    Goal status: INITIAL  3.  Perform ADL's with left hip pain not > 3/10.  Goal status: INITIAL  4.  Improve LEFS score by at least 10 points.  Goal status: INITIAL   PLAN:  PT FREQUENCY: 2x/week  PT DURATION: 6 weeks  PLANNED INTERVENTIONS: 97110-Therapeutic exercises, 97530- Therapeutic activity, [id number]- Neuromuscular re-education, 97535- Self Care, [id number]- Manual therapy, [id number]- Electrical stimulation (unattended), 97035- Ultrasound, 79439 (1-2 muscles), 20561 (3+ muscles)- Dry Needling, Patient/Family education, Cryotherapy, and Moist heat  PLAN FOR NEXT SESSION: Combo e'stim/US , STW/M, left LE strengthening.     Lemon Sternberg, PT 06/30/2024, 4:31 PM

## 2024-06-30 NOTE — Assessment & Plan Note (Addendum)
 Trial off acei 05/07/2017 and off losartan  50 05/24/2024  - Allergy screen 05/24/2024 >  Eos 0.1 /  IgE  50 - max gerd rx 05/24/2024  and off losaratan >>> no better 06/29/2024  >>>   refer back to ENT for globus/ throat clearing   Comment:  Upper airway cough syndrome (previously labeled PNDS),  is so named because it's frequently impossible to sort out how much is  CR/sinusitis with freq throat clearing (which can be related to primary GERD)   vs  causing  secondary ( extra esophageal)  GERD from wide swings in gastric pressure that occur with throat clearing, often  promoting self use of mint and menthol lozenges that reduce the lower esophageal sphincter tone and exacerbate the problem further in a cyclical fashion.   These are the same pts (now being labeled as having irritable larynx syndrome by some cough centers) who not infrequently have a history of having failed to tolerate ace inhibitors,  dry powder inhalers or biphosphonates or report having atypical/extraesophageal reflux symptoms from LPR (globus, throat clearing as is the case here)  that don't respond to standard doses of PPI  and are easily confused as having aecopd or asthma flares by even experienced allergists/ pulmonologists (myself included).   >>> if neg ent eval consider adding gabapentin  for irritable larynx

## 2024-06-30 NOTE — Assessment & Plan Note (Addendum)
 Assoc with upper airway cough since feb 2025  - 06/29/2024   Walked on RA  x  3   lap(s) =  approx 450   ft  @ mod to fast  pace, stopped due to end of study  with lowest 02 sats 86%  - CT chest due 07/07/24 with coronary study >>>  No evidence at all to support airways dz for either cough or doe complaints but she could have occult ILD > await CT chest and if abnormal then return for PFTs  - in meantime rec slower paced walking which has been her baseline anyway for months to years.   Discussed in detail all the  indications, usual  risks and alternatives  relative to the benefits with patient who agrees to proceed with w/u as outlined.     Each maintenance medication was reviewed in detail including emphasizing most importantly the difference between maintenance and prns and under what circumstances the prns are to be triggered using an action plan format where appropriate.  Total time for H and P, chart review, counseling, reviewing pulse ox  device(s) , directly observing portions of ambulatory 02 saturation study/ and generating customized AVS unique to this office visit / same day charting = 40 min  for multiple chronic  refractory respiratory  symptoms of uncertain etiology

## 2024-07-01 ENCOUNTER — Ambulatory Visit: Admitting: Physical Therapy

## 2024-07-01 ENCOUNTER — Encounter: Payer: Self-pay | Admitting: Physical Therapy

## 2024-07-01 DIAGNOSIS — M62838 Other muscle spasm: Secondary | ICD-10-CM

## 2024-07-01 DIAGNOSIS — M6281 Muscle weakness (generalized): Secondary | ICD-10-CM | POA: Diagnosis not present

## 2024-07-01 DIAGNOSIS — M25552 Pain in left hip: Secondary | ICD-10-CM

## 2024-07-01 NOTE — Therapy (Signed)
 OUTPATIENT PHYSICAL THERAPY LOWER EXTREMITY TREATMENT   Patient Name: Felicia Pugh MRN: [id number] DOB:[birth year], 78 y.o., female Today's Date: 07/01/2024  END OF SESSION:  PT End of Session - 07/01/24 1302     Visit Number 9    Number of Visits 12    Date for Recertification  07/15/24    PT Start Time 1300    PT Stop Time 1358    PT Time Calculation (min) 58 min    Activity Tolerance Patient tolerated treatment well    Behavior During Therapy [facility] for tasks assessed/performed              Past Medical History:  Diagnosis Date   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 Tona, no longer seeing anyone (02/2015)   Cataract    Complication of anesthesia    difficulty breathing after waking up from anesthesia   Crohn's disease (HCC) 2016   diagnosed in 2016 and started on Colazal , but inflammation/ulceration in ileum/cecum dates back to 2006.   Diabetes mellitus without complication (HCC)    Diabetic neuropathy (HCC)    Hyperlipidemia    Hypertension    IBS (irritable bowel syndrome)    Stroke (HCC) 08/12/2009   Syncope and collapse    Thyroid  nodule    Ulcer    Past Surgical History:  Procedure Laterality Date   ABDOMINAL HYSTERECTOMY     partial   BACK SURGERY     x3 (Dr Marcellus, Dr Gust)   BACK SURGERY  03/2022   BREAST CYST EXCISION Right 2002   COLONOSCOPY  2006   RMR: Normal colon except for ileocecal valve, eroded/ulcerated areas at ileocecal valve with mucosal hemorrhage present status post biopsy. ulcer (ileocecal valve, biopsy): Chronic active inflammation with ulceration, nonspecific.   COLONOSCOPY  09/06/2009   RMR: Suboptimal prep on the right side. Ulcers noted at IC valve, base of cecum, and scattered through food part of ascending colon. Pathology wiht chronic active colitis, question Crohn's   COLONOSCOPY N/A 03/27/2015   Dr. Shaaron: abnormal cecum and IC valve most consistent with IBD. TI intubated and appeared normal. Likely  Crohn's disease. Patient denies NSAID use. No improvement with Lialda samples at time of initial diagnosis   COLONOSCOPY N/A 05/15/2017   Dr. Shaaron: ab normal IC valve, s/p biopsy with active ileitis, diverticulosis in sigmoid and descending colon. Non-bleeding internal hemorrhoids   ESOPHAGOGASTRODUODENOSCOPY     HAND SURGERY Right    Carpel tunnel and right elbow   LAPAROSCOPIC APPENDECTOMY N/A 07/21/2016   Procedure: APPENDECTOMY LAPAROSCOPIC;  Surgeon: Oneil Budge, MD;  Location: [facility];  Service: General;  Laterality: N/A;   Small bowel capsule endoscopy  06/10/2005   RMR: single erosion versus AVM, distal ileum   Small bowel capsule endoscopy  prior to 05/2005   [place]: reported erosions/ulcerations per medical record. actual report unavailable.   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, with long-term current use of insulin  (HCC) 12/12/2023   Encounter for long-term (current) use of insulin  (HCC) 02/26/2023   Crohn's disease of both small and large intestine without complication (HCC) 06/07/2021   Loose stools 06/07/2021   Bilateral carpal tunnel syndrome 10/17/2020   Upper airway cough syndrome 05/08/2017   Obesity (BMI 30-39.9) 05/08/2017   Solitary pulmonary nodule 05/08/2017   GERD (gastroesophageal reflux disease) 05/02/2017   Type 2 diabetes  mellitus with other specified complication (HCC) 05/16/2015   Crohn's disease with complication (HCC) 05/16/2015   Diverticulosis of colon without hemorrhage    History of stroke 07/08/2014   Osteopenia 05/11/2014   Hyperlipidemia associated with type 2 diabetes mellitus (HCC) 06/01/2008   Anemia, chronic disease 06/01/2008   Hypertension associated with diabetes (HCC) 06/01/2008   REFERRING PROVIDER: Redell Shoals MD  REFERRING DIAG: Left trochanteric hip bursitis.  THERAPY DIAG:  Pain in left  hip  Muscle weakness (generalized)  Other muscle spasm  Rationale for Evaluation and Treatment: Rehabilitation  ONSET DATE: 'On and off over a year.  SUBJECTIVE:   SUBJECTIVE STATEMENT: Pt states pain has been improving -- has now been able to sleep at night.     PERTINENT HISTORY: Multiple lumbar surgeries.   PAIN:  Are you having pain? Yes: NPRS scale: 3/10.   Pain location: Left hip. Pain description: Ache, sharp. Aggravating factors: As above.   Relieving factors: As above.    PRECAUTIONS: Fall.  Recommend cane usage.  She has a cane.    RED FLAGS: None   WEIGHT BEARING RESTRICTIONS: No  FALLS:  Has patient fallen in last 6 months? Yes. Number of falls Many.    LIVING ENVIRONMENT: Lives in: House/apartment Stairs: Stairs to enter, Ramped.   Has following equipment at home: None  PLOF: Independent  PATIENT GOALS: Not have pain and be able to [employer] at church.     OBJECTIVE:  Note: Objective measures were completed at Evaluation unless otherwise noted.  DIAGNOSTIC FINDINGS: IMPRESSION: 1. Status post fusion and decompression L2-S1, previously L4-S1. No spinal canal stenosis or neural foraminal narrowing. 2. Fluid collection in the surgical bed and soft tissues posterior to L2-L5, measuring up to 7.3 cm, which does not appear to cause mass effect on the thecal sac and is favored to be a postsurgical seroma. 3. Increased T2 signal in the inferior paraspinous muscles, left greater than right, which is nonspecific but can be seen in the setting of myositis or denervation edema. 4. 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  PATIENT SURVEYS:  LEFS:  22/80.    POSTURE: rounded shoulders, forward head, and flexed trunk   PALPATION: Very TTP over left greater trochanter and left lateral hip musculature with trigger points noted.    LOWER EXTREMITY ROM:  Left hip IR limited to 12 degrees.  LOWER  EXTREMITY MMT:  Left hip flexion 3+/5, abd is 4-/5, left knee extension is 4-/5.     GAIT: Slow and purposeful.  Recommended cane usage.                                                                                                                                TREATMENT DATE: 07/01/24:  Nustep level 4 x 15 minutes f/b sitting piriformis stretch 2x 30 f/b standing hip abd red TB 2x10 f/b standing hip flexion  red TB 2x10 f/b STW/M with patient in right sdly position with folded pillow between knees for comfort f/b HMP and IFC at 80-150 Hz on 40% scan x 15 minutes.  Normal modality resposne following removal of modality.   06/30/24:  Nustep level 4 x 15 minutes f/b STW/M x 23 minutes with patient in right sdly position with folded pillow between knees for comfort f/b HMP and IFC at 80-150 Hz on 40% scan x 15 minutes.  Normal modality resposne following removal of modality.    06/23/24:  Nustep level 3 LE's only x 15 minutes f/b supine hip abduction with red theraband x 2 minutes f/b sdly clamshell STW/M x 10 minutes with ischemic release technique to left TFL f/b HMP and IFC at 80-150 Hz on 40% scan x 20 minutes.  Normal modality resposne following removal of modality.   06/17/24:   Nustep level 4 x 15 minutes  supine figure 4 stretch x 30 Supine piriformis stretch x 30 Suine LTR x10 Supine clamshell green theraband isometrics 2x10 Supine bridge green TB around thighs 2x10 STW/M and IASTM left lateral hip musculature with patient in right sdly position with pillow between knees for comfort f/b  HMP and IFC at 80-150 Hz on 40% scan x 15 minutes. Normal modality resposne following removal of modality.     PATIENT EDUCATION:     PATIENT EDUCATION:  Education details: see below. Person educated: Patient Education method: Explanation Education comprehension: verbalized understanding  HOME EXERCISE PROGRAM: Resisted hip abduction with red  theraband.  ASSESSMENT:  CLINICAL IMPRESSION: Pt now able to sleep at night without pain. Able to progress pt to standing exercises to work on increasing stability with full weight on LEs. Continued to work on decreasing posterior hip stiffness.   OBJECTIVE IMPAIRMENTS: decreased activity tolerance, decreased ROM, decreased strength, increased muscle spasms, and pain.   ACTIVITY LIMITATIONS: carrying, lifting, bending, standing, and locomotion level  PARTICIPATION LIMITATIONS: meal prep, cleaning, laundry, and community activity  PERSONAL FACTORS: Time since onset of injury/illness/exacerbation and 1 comorbidity: h/o lumbar surgeries.   are also affecting patient's functional outcome.   REHAB POTENTIAL: Good  CLINICAL DECISION MAKING: Evolving/moderate complexity  EVALUATION COMPLEXITY: Low   GOALS:  SHORT TERM GOALS: Target date: 06/17/24  Ind with an initial HEP. Goal status: INITIAL  LONG TERM GOALS: Target date: 07/15/24  Ind with an advanced HEP.  Goal status: INITIAL  2.  Improve left LE strength by 1 muscle garde to increase stability for functional tasks.    Goal status: INITIAL  3.  Perform ADL's with left hip pain not > 3/10.  Goal status: INITIAL  4.  Improve LEFS score by at least 10 points.  Goal status: INITIAL   PLAN:  PT FREQUENCY: 2x/week  PT DURATION: 6 weeks  PLANNED INTERVENTIONS: 97110-Therapeutic exercises, 97530- Therapeutic activity, [id number]- Neuromuscular re-education, 97535- Self Care, 02859- Manual therapy, [id number]- Electrical stimulation (unattended), 97035- Ultrasound, 79439 (1-2 muscles), 20561 (3+ muscles)- Dry Needling, Patient/Family education, Cryotherapy, and Moist heat  PLAN FOR NEXT SESSION: Combo e'stim/US , STW/M, left LE strengthening.     Rushil Kimbrell April Ma L Sharise Lippy, PT 07/01/2024, 1:46 PM

## 2024-07-05 ENCOUNTER — Ambulatory Visit

## 2024-07-05 ENCOUNTER — Encounter (HOSPITAL_COMMUNITY): Payer: Self-pay

## 2024-07-05 DIAGNOSIS — M25552 Pain in left hip: Secondary | ICD-10-CM

## 2024-07-05 DIAGNOSIS — M62838 Other muscle spasm: Secondary | ICD-10-CM

## 2024-07-05 DIAGNOSIS — M6281 Muscle weakness (generalized): Secondary | ICD-10-CM | POA: Diagnosis not present

## 2024-07-05 NOTE — Therapy (Addendum)
 " OUTPATIENT PHYSICAL THERAPY LOWER EXTREMITY TREATMENT   Patient Name: Felicia Pugh MRN: [id number] DOB:08-10-1946, 78 y.o., female Today's Date: 07/05/2024  END OF SESSION:  PT End of Session - 07/05/24 0859     Visit Number 10    Number of Visits 12    Date for Recertification  07/15/24    PT Start Time 0854    PT Stop Time 0948    PT Time Calculation (min) 54 min    Activity Tolerance Patient tolerated treatment well    Behavior During Therapy [facility] for tasks assessed/performed              Past Medical History:  Diagnosis Date   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 Tona, no longer seeing anyone (02/2015)   Cataract    Complication of anesthesia    difficulty breathing after waking up from anesthesia   Crohn's disease (HCC) 2016   diagnosed in 2016 and started on Colazal , but inflammation/ulceration in ileum/cecum dates back to 2006.   Diabetes mellitus without complication (HCC)    Diabetic neuropathy (HCC)    Hyperlipidemia    Hypertension    IBS (irritable bowel syndrome)    Stroke (HCC) 08/12/2009   Syncope and collapse    Thyroid  nodule    Ulcer    Past Surgical History:  Procedure Laterality Date   ABDOMINAL HYSTERECTOMY     partial   BACK SURGERY     x3 (Dr Marcellus, Dr Gust)   BACK SURGERY  03/2022   BREAST CYST EXCISION Right 2002   COLONOSCOPY  2006   RMR: Normal colon except for ileocecal valve, eroded/ulcerated areas at ileocecal valve with mucosal hemorrhage present status post biopsy. ulcer (ileocecal valve, biopsy): Chronic active inflammation with ulceration, nonspecific.   COLONOSCOPY  09/06/2009   RMR: Suboptimal prep on the right side. Ulcers noted at IC valve, base of cecum, and scattered through food part of ascending colon. Pathology wiht chronic active colitis, question Crohn's   COLONOSCOPY N/A 03/27/2015   Dr. Shaaron: abnormal cecum and IC valve most consistent with IBD. TI intubated and appeared normal. Likely  Crohn's disease. Patient denies NSAID use. No improvement with Lialda samples at time of initial diagnosis   COLONOSCOPY N/A 05/15/2017   Dr. Shaaron: ab normal IC valve, s/p biopsy with active ileitis, diverticulosis in sigmoid and descending colon. Non-bleeding internal hemorrhoids   ESOPHAGOGASTRODUODENOSCOPY     HAND SURGERY Right    Carpel tunnel and right elbow   LAPAROSCOPIC APPENDECTOMY N/A 07/21/2016   Procedure: APPENDECTOMY LAPAROSCOPIC;  Surgeon: Oneil Budge, MD;  Location: [facility];  Service: General;  Laterality: N/A;   Small bowel capsule endoscopy  06/10/2005   RMR: single erosion versus AVM, distal ileum   Small bowel capsule endoscopy  prior to 05/2005   [place]: reported erosions/ulcerations per medical record. actual report unavailable.   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, with long-term current use of insulin  (HCC) 12/12/2023   Encounter for long-term (current) use of insulin  (HCC) 02/26/2023   Crohn's disease of both small and large intestine without complication (HCC) 06/07/2021   Loose stools 06/07/2021   Bilateral carpal tunnel syndrome 10/17/2020   Upper airway cough syndrome 05/08/2017   Obesity (BMI 30-39.9) 05/08/2017   Solitary pulmonary nodule 05/08/2017   GERD (gastroesophageal reflux disease) 05/02/2017   Type 2  diabetes mellitus with other specified complication (HCC) 05/16/2015   Crohn's disease with complication (HCC) 05/16/2015   Diverticulosis of colon without hemorrhage    History of stroke 07/08/2014   Osteopenia 05/11/2014   Hyperlipidemia associated with type 2 diabetes mellitus (HCC) 06/01/2008   Anemia, chronic disease 06/01/2008   Hypertension associated with diabetes (HCC) 06/01/2008   REFERRING PROVIDER: Redell Shoals MD  REFERRING DIAG: Left trochanteric hip bursitis.  THERAPY DIAG:  Pain in left  hip  Muscle weakness (generalized)  Other muscle spasm  Rationale for Evaluation and Treatment: Rehabilitation  ONSET DATE: 'On and off over a year.  SUBJECTIVE:   SUBJECTIVE STATEMENT: Pt states pain has been improving, 2/10 left hip pain.   PERTINENT HISTORY: Multiple lumbar surgeries.   PAIN:  Are you having pain? Yes: NPRS scale: 2/10.   Pain location: Left hip. Pain description: Ache, sharp. Aggravating factors: As above.   Relieving factors: As above.    PRECAUTIONS: Fall.  Recommend cane usage.  She has a cane.    RED FLAGS: None   WEIGHT BEARING RESTRICTIONS: No  FALLS:  Has patient fallen in last 6 months? Yes. Number of falls Many.    LIVING ENVIRONMENT: Lives in: House/apartment Stairs: Stairs to enter, Ramped.   Has following equipment at home: None  PLOF: Independent  PATIENT GOALS: Not have pain and be able to [employer] at [employer].     OBJECTIVE:  Note: Objective measures were completed at Evaluation unless otherwise noted.  DIAGNOSTIC FINDINGS: IMPRESSION: 1. Status post fusion and decompression L2-S1, previously L4-S1. No spinal canal stenosis or neural foraminal narrowing. 2. Fluid collection in the surgical bed and soft tissues posterior to L2-L5, measuring up to 7.3 cm, which does not appear to cause mass effect on the thecal sac and is favored to be a postsurgical seroma. 3. Increased T2 signal in the inferior paraspinous muscles, left greater than right, which is nonspecific but can be seen in the setting of myositis or denervation edema. 4. 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  PATIENT SURVEYS:  LEFS:  22/80.    POSTURE: rounded shoulders, forward head, and flexed trunk   PALPATION: Very TTP over left greater trochanter and left lateral hip musculature with trigger points noted.    LOWER EXTREMITY ROM:  Left hip IR limited to 12 degrees.  LOWER EXTREMITY MMT:  Left  hip flexion 3+/5, abd is 4-/5, left knee extension is 4-/5.     GAIT: Slow and purposeful.  Recommended cane usage.                                                                                                                               TREATMENT DATE:  07/05/24:  Nustep level 4 x 15 minutes f/b STW/M with patient in right sdly position with folded pillow between knees for comfort f/b HMP and IFC at 80-150 Hz on  40% scan x 15 minutes.  Normal modality resposne following removal of modality.  06/30/24:  Nustep level 4 x 15 minutes f/b STW/M x 23 minutes with patient in right sdly position with folded pillow between knees for comfort f/b HMP and IFC at 80-150 Hz on 40% scan x 15 minutes.  Normal modality resposne following removal of modality.  06/23/24:  Nustep level 3 LE's only x 15 minutes f/b supine hip abduction with red theraband x 2 minutes f/b sdly clamshell STW/M x 10 minutes with ischemic release technique to left TFL f/b HMP and IFC at 80-150 Hz on 40% scan x 20 minutes.  Normal modality resposne following removal of modality.  PATIENT EDUCATION:     PATIENT EDUCATION:  Education details: see below. Person educated: Patient Education method: Explanation Education comprehension: verbalized understanding  HOME EXERCISE PROGRAM: Resisted hip abduction with red theraband.  ASSESSMENT:  CLINICAL IMPRESSION: Pt arrives for today's treatment session reporting 2/10 left hip pain.  Pt able to increase LEFs score to 60/80, well surpassing her LTG.  Pt able to demonstrate 4+/5 left hip strength meeting her strength goal as well.  STW/M performed to left hip to decrease pain and tone.  Normal responses to estim and Mh noted upon removal.  Pt encouraged to call the facility with any questions or concerns.  Pt denied any pain at completion of today's treatment session.  Pt ready for discharge at this time.  OBJECTIVE IMPAIRMENTS: decreased activity tolerance, decreased ROM,  decreased strength, increased muscle spasms, and pain.   ACTIVITY LIMITATIONS: carrying, lifting, bending, standing, and locomotion level  PARTICIPATION LIMITATIONS: meal prep, cleaning, laundry, and community activity  PERSONAL FACTORS: Time since onset of injury/illness/exacerbation and 1 comorbidity: h/o lumbar surgeries.   are also affecting patient's functional outcome.   REHAB POTENTIAL: Good  CLINICAL DECISION MAKING: Evolving/moderate complexity  EVALUATION COMPLEXITY: Low   GOALS:  SHORT TERM GOALS: Target date: 06/17/24  Ind with an initial HEP. Goal status: MET  LONG TERM GOALS: Target date: 07/15/24  Ind with an advanced HEP.  Goal status: MET  2.  Improve left LE strength by 1 muscle garde to increase stability for functional tasks.     11/24: 4+/5 global left LE strength Goal status: MET  3.  Perform ADL's with left hip pain not > 3/10. 11/24: 2/10  Goal status: MET  4.  Improve LEFS score by at least 10 points.   11/24: 60/80 Goal status: MET   PLAN:  PT FREQUENCY: 2x/week  PT DURATION: 6 weeks  PLANNED INTERVENTIONS: 97110-Therapeutic exercises, 97530- Therapeutic activity, 97112- Neuromuscular re-education, 97535- Self Care, 02859- Manual therapy, G0283- Electrical stimulation (unattended), 97035- Ultrasound, 79439 (1-2 muscles), 20561 (3+ muscles)- Dry Needling, Patient/Family education, Cryotherapy, and Moist heat  PLAN FOR NEXT SESSION: Combo e'stim/US , STW/M, left LE strengthening.     Delon DELENA Gosling, PTA 07/05/2024, 10:33 AM   PHYSICAL THERAPY DISCHARGE SUMMARY  Visits from Start of Care: 10.  Current functional level related to goals / functional outcomes: See above.     Remaining deficits: Goals met.   Education / Equipment: HEP.   Patient agrees to discharge. Patient goals were met. Patient is being discharged due to being pleased with the current functional level.    Chad Applegate MPT   "

## 2024-07-06 DIAGNOSIS — R059 Cough, unspecified: Secondary | ICD-10-CM | POA: Diagnosis not present

## 2024-07-06 DIAGNOSIS — R490 Dysphonia: Secondary | ICD-10-CM | POA: Diagnosis not present

## 2024-07-07 ENCOUNTER — Ambulatory Visit (HOSPITAL_COMMUNITY)
Admission: RE | Admit: 2024-07-07 | Discharge: 2024-07-07 | Disposition: A | Discharge status: Home / Self Care | Source: Ambulatory Visit | Attending: Cardiology | Admitting: Cardiology

## 2024-07-07 DIAGNOSIS — I2510 Atherosclerotic heart disease of native coronary artery without angina pectoris: Secondary | ICD-10-CM | POA: Diagnosis not present

## 2024-07-07 DIAGNOSIS — J986 Disorders of diaphragm: Secondary | ICD-10-CM | POA: Diagnosis not present

## 2024-07-07 DIAGNOSIS — K5730 Diverticulosis of large intestine without perforation or abscess without bleeding: Secondary | ICD-10-CM | POA: Insufficient documentation

## 2024-07-07 DIAGNOSIS — R911 Solitary pulmonary nodule: Secondary | ICD-10-CM | POA: Diagnosis not present

## 2024-07-07 DIAGNOSIS — R0789 Other chest pain: Secondary | ICD-10-CM | POA: Diagnosis not present

## 2024-07-07 DIAGNOSIS — J9811 Atelectasis: Secondary | ICD-10-CM | POA: Diagnosis not present

## 2024-07-07 DIAGNOSIS — I281 Aneurysm of pulmonary artery: Secondary | ICD-10-CM | POA: Insufficient documentation

## 2024-07-07 DIAGNOSIS — I700 Atherosclerosis of aorta: Secondary | ICD-10-CM | POA: Insufficient documentation

## 2024-07-07 MED ORDER — IOHEXOL 350 MG/ML SOLN
100.0000 mL | Freq: Once | INTRAVENOUS | Status: AC | PRN
  Administered 2024-07-07: 100 mL via INTRAVENOUS

## 2024-07-07 MED ORDER — NITROGLYCERIN 0.4 MG SL SUBL
0.8000 mg | SUBLINGUAL_TABLET | Freq: Once | SUBLINGUAL | Status: AC
  Administered 2024-07-07: 0.8 mg via SUBLINGUAL

## 2024-07-12 ENCOUNTER — Encounter: Payer: Self-pay | Admitting: Nurse Practitioner

## 2024-07-13 DIAGNOSIS — B351 Tinea unguium: Secondary | ICD-10-CM | POA: Diagnosis not present

## 2024-07-13 DIAGNOSIS — E1142 Type 2 diabetes mellitus with diabetic polyneuropathy: Secondary | ICD-10-CM | POA: Diagnosis not present

## 2024-07-13 DIAGNOSIS — M79675 Pain in left toe(s): Secondary | ICD-10-CM | POA: Diagnosis not present

## 2024-07-13 DIAGNOSIS — M79674 Pain in right toe(s): Secondary | ICD-10-CM | POA: Diagnosis not present

## 2024-07-13 DIAGNOSIS — L84 Corns and callosities: Secondary | ICD-10-CM | POA: Diagnosis not present

## 2024-07-19 ENCOUNTER — Telehealth: Payer: Self-pay | Admitting: Cardiology

## 2024-07-19 NOTE — Telephone Encounter (Signed)
  Patient is calling to follow up CT result

## 2024-07-19 NOTE — Telephone Encounter (Signed)
 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  This is a reassuring report.  Does have a stable and likely benign 6 mm right middle lobe pulmonary nodule.  Will continue to monitor this over time.  I will go over this report with her at next office visit.   Almarie Crate, AGNP-C

## 2024-07-19 NOTE — Telephone Encounter (Signed)
 Called  unable to leave message . Patient voicemail is full.

## 2024-07-20 ENCOUNTER — Telehealth: Payer: Self-pay | Admitting: Family Medicine

## 2024-07-20 ENCOUNTER — Ambulatory Visit: Payer: Self-pay | Admitting: Internal Medicine

## 2024-07-20 ENCOUNTER — Encounter: Payer: Self-pay | Admitting: Internal Medicine

## 2024-07-20 ENCOUNTER — Ambulatory Visit: Payer: Self-pay | Admitting: Nurse Practitioner

## 2024-07-20 ENCOUNTER — Ambulatory Visit: Admitting: Internal Medicine

## 2024-07-20 VITALS — BP 132/80 | HR 102 | Ht 62.0 in | Wt 170.0 lb

## 2024-07-20 DIAGNOSIS — R058 Other specified cough: Secondary | ICD-10-CM | POA: Diagnosis not present

## 2024-07-20 DIAGNOSIS — R0609 Other forms of dyspnea: Secondary | ICD-10-CM | POA: Diagnosis not present

## 2024-07-20 DIAGNOSIS — E1159 Type 2 diabetes mellitus with other circulatory complications: Secondary | ICD-10-CM

## 2024-07-20 DIAGNOSIS — E119 Type 2 diabetes mellitus without complications: Secondary | ICD-10-CM | POA: Diagnosis not present

## 2024-07-20 DIAGNOSIS — I10 Essential (primary) hypertension: Secondary | ICD-10-CM | POA: Diagnosis not present

## 2024-07-20 MED ORDER — RABEPRAZOLE SODIUM 20 MG PO TBEC: 20.0000 mg | DELAYED_RELEASE_TABLET | Freq: Every day | ORAL | 3 refills | Status: DC

## 2024-07-20 NOTE — Progress Notes (Signed)
 Patient ID: Felicia Pugh, female   DOB: Jun 25, 1946    MRN: [id number]  HPI  21 yobf  Never smoker with new onset sob/hoarseness > ER eval > 5mm nodule  RML on CT 03/17/17 and ent/thyroid  eval / GI Eval > started PPI x one week prior to OV   but no better so referred to pulmonary clinic 05/07/2017 by Dr   Dettinger    05/07/2017 1st [facility] office visit/ Shawon Denzer   Chief Complaint  Patient presents with   Pulmonary Consult    Referred by Dr. Maryanne.  Pt c/o SOB x 2 months- occurs with exertion such as walking to her mailbox and back. She also get SOB when she lies down.  She also c/o cough that comes and goes-  mostly non prod   variable mostly dry cough / sob, globus sensation x 2 months, indolent onset daily >> noct symptoms with neg w/u to date while on ACEi / feels choked when lies down but fine once asleep no change on gerd rx to date but only been a week Rec Protononix (pantoprazole ) 40  Mg Take 30- 60 min before your first and last meals of the day  Stop lisinopril  and start losartan  100-25 one daily in its place GERD rx    05/24/2024 re-establish  - consult Dr Dettinger ov/[place] office/Kinsey Karch re: cough / sob / hoarseness  x Feb 2025 maint on no  resp rx and no longer on gerd rx   Chief Complaint  Patient presents with   Establish Care    Coughing - shob   Dyspnea:  [employer]  Cough: most productive no pattern  daytime > noct and slt beige assoc with pnds / sneezing  Sleeping: in recliner x July 2024  30 degrees s resp cc  SABA use: none 02: none  Rec Patient Instructions  Stop losartan  and try valsartan  160 mg one half tablet daily and adjust upward if needed to one daily  Tessalon  pearls 200 mg up to 4 x daily if needed with goal to stop coughing completely  Pantoprazole  (protonix ) 40 mg   Take  30-60 min before first meal of the day and Pepcid  (famotidine )  20 mg after supper until return to office - this is the best way to tell whether stomach acid  is contributing to your problem.   GERD diet reviewed, bed blocks rec  Depomedrol 120 mg IM  You need to see your orthopedist about your arm pain which I suspect carpal tunnel syndrome   Allergy screen 05/24/2024 >  Eos 0.1 /  IgE  50     06/29/2024  f/u ov/[place] office/Latroy Gaymon re: : cough / sob / hoarseness  x Feb 2025 maint on gerd rx   Chief Complaint  Patient presents with   Cough    shob  Dyspnea:  [employer] shopping ok but pace is slow  Cough: dry /daytime/ very hoarse  Sleeping: recliner 10 degrees s  resp cc  SABA use: none  02: none  Rec I  recommend you return to your ENT to discuss the hoarseness that you have when you talk a lot    I can refer you to the voice center at [facility] for a second opinion if your would like - Dr Garnette Silvan   -  try hard rock candy to reduce urge to clear throat but no mint/ menthol or chocolate products   07/06/24 neg laryngoscopy   07/20/2024  f/u ov/[place] office/Chanae Gemma re: UACS/ VCD  maint on max gerd rx /no inhalers   Chief Complaint  Patient presents with   Acute Visit    Sob - coughing with yellow mucus (sometimes) - hoariness  Ct 11/30 by cardio   Dyspnea:  does all shopping she wants to [employer] pushing cart  Cough: dry / raspy assoc with hoarseness/ subjective wheeze  Sleeping: better with recliner > 10 degrees wakes up nightly with doe    resp cc  SABA use: none  02: none     No obvious day to day or daytime variability or assoc excess/ purulent sputum or mucus plugs or hemoptysis or cp or chest tightness, subjective wheeze or overt sinus or hb symptoms.    Also denies any obvious fluctuation of symptoms with weather or environmental changes or other aggravating or alleviating factors except as outlined above   No unusual exposure hx or h/o childhood pna/ asthma or knowledge of premature birth.  Current Allergies, Complete Past Medical History, Past Surgical History, Family History, and Social History were reviewed in  [facility] record.  ROS  The following are not active complaints unless bolded Hoarseness, sore throat/globus  dysphagia, dental problems, itching, sneezing,  nasal congestion or discharge of excess mucus or purulent secretions, ear ache,   fever, chills, sweats, unintended wt loss or wt gain, classically pleuritic or exertional cp,  orthopnea pnd or arm/hand swelling  or leg swelling, presyncope, palpitations, abdominal pain, anorexia, nausea, vomiting, diarrhea  or change in bowel habits or change in bladder habits, change in stools or change in urine, dysuria, hematuria,  rash, arthralgias, visual complaints, headache, numbness, weakness or ataxia or problems with walking or coordination,  change in mood or  memory.        Current Meds  Medication Sig   acetaminophen  (TYLENOL ) 650 MG CR tablet Take 650 mg by mouth every 8 (eight) hours as needed for pain.   Apoaequorin (PREVAGEN) 10 MG CAPS Take by mouth.   atorvastatin  (LIPITOR) 40 MG tablet Take 1 tablet (40 mg total) by mouth daily.   balsalazide (COLAZAL ) 750 MG capsule TAKE 2 CAPSULES BY MOUTH IN THE MORNING AND AT BEDTIME   benzonatate  (TESSALON ) 200 MG capsule Take 1 capsule (200 mg total) by mouth 3 (three) times daily as needed for cough.   Empagliflozin -linaGLIPtin  (GLYXAMBI ) 25-5 MG TABS Take 1 tablet by mouth daily.   famotidine  (PEPCID ) 20 MG tablet One after supper   fluticasone  (FLONASE ) 50 MCG/ACT nasal spray Place 1 spray into both nostrils 2 (two) times daily as needed for allergies or rhinitis.   glucose blood (ONETOUCH VERIO) test strip CHECK GLUCOSE TWICE DAILY Dx E11.69   insulin  degludec (TRESIBA  FLEXTOUCH) 100 UNIT/ML FlexTouch Pen INJECT 40 UNITS SUBCUTANEOUSLY ONCE DAILY   Insulin  Pen Needle (PEN NEEDLES) 31G X 5 MM MISC Use to give insulin  with flexpen   Insulin  Syringe-Needle U-100 31G X 1/4 1 ML MISC 1 Syringe by Does not apply route 2 (two) times daily.   Multiple Vitamins-Minerals (WOMENS  50+ MULTI VITAMIN/MIN PO) Take by mouth.   RABEprazole  (ACIPHEX ) 20 MG tablet Take 1 tablet (20 mg total) by mouth daily.   valsartan  (DIOVAN ) 160 MG tablet Take 1 tablet (160 mg total) by mouth daily.   [DISCONTINUED] pantoprazole  (PROTONIX ) 40 MG tablet Take 1 tablet (40 mg total) by mouth daily. Take 30-60 min before first meal of the day  Objective:   Physical Exam    Wts  07/20/2024       170  06/29/2024    169   05/07/17 180 lb (81.6 kg)  05/02/17 179 lb 9.6 oz (81.5 kg)  05/01/17 187 lb (84.8 kg)    Vital signs reviewed  07/20/2024  - Note at rest 02 sats  96% on RA   General appearance:    ambulatory  somber bf classic VCD and pseudowheeze improves with PLB    HEENT : Oropharynx  clear      Nasal turbinates nl    NECK :  without  apparent JVD/ palpable Nodes/TM    LUNGS: no acc muscle use,  Nl contour chest transmitted wheeze from upper airway.  CV:  RRR  no s3 or murmur or increase in P2, and no edema   ABD:  soft and nontender   MS:  Gait nl   ext warm without deformities Or obvious joint restrictions  calf tenderness, cyanosis or clubbing    SKIN: warm and dry without lesions    NEURO:  alert, approp, nl sensorium with  no motor or cerebellar deficits apparent.       Assessment:        Assessment & Plan Upper airway cough syndrome Trial off acei 05/07/2017 and off losartan  50 05/24/2024  - Allergy screen 05/24/2024 >  Eos 0.1 /  IgE  50 - max gerd rx 05/24/2024  and off losaratan >>> no better 06/29/2024 > refer back to ENT for globus/ throat clearing a - ENT eval ([facility])  07/06/24  rec Speech therapy> 07/20/2024  refer to Lupita Connor  Symptoms are very similar to prior eval in 2018 though pt has little memory of this work up and take gerd rx though PPI appears to be causing diarrhea and I strongly doubt she's been taking it consistently or using hard rock candy to suppress the urge to cough as directed so rec    >>> change ppi  to aciphex  20 mg daily ac and stop pantoprazole  but continue h2 at hs  >>> add H1 HS per cough guidelines for pnds/ noct cough   >>> no need for ICS    >>> 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   Hypertension associated with diabetes (HCC) Trial off acei 05/07/2017  and off loartan 05/24/2024  - valsartan  160 mg one daily 05/24/2024 >>>   Adequate control on present rx, reviewed in detail with pt > no change in rx needed  with refill s per PCP or cards   DOE (dyspnea on exertion) Assoc with upper airway cough since feb 2025  - 06/29/2024   Walked on RA  x  3   lap(s) =  approx 450   ft  @ mod to fast  pace, stopped due to end of study  with lowest 02 sats 86%  - CT chest due 07/07/24 with coronary study >>> No ILD - 07/20/2024   Walked on RA   x  2  lap(s) =  approx 300   ft  @ mod  pace, stopped due to SOB   with lowest 02 sats 93%   No further w/u needed for now, will focus instead on rx of UACS  (see sep a/p)    Each maintenance medication was reviewed in detail including emphasizing most importantly the difference between maintenance and prns and under what circumstances the prns are to be triggered using an action plan format where appropriate.  Total time for H and P, chart review, counseling,  directly observing portions of ambulatory 02 saturation study/ and generating customized AVS unique to this office visit / same day charting = 35 min for multiple  refractory respiratory  symptoms of uncertain etiology                   AVS  Patient Instructions  My office will be contacting you by phone for referral to speech therapy for hoarseness and VCD Lupita Connor, SLP  [facility]  [address]  [phone]   Stop pantoprazole  and start Aciphex  20 mg Take 30-60 min before first meal of the day and pepcid  20 mg after supper     For drainage / throat tickle try take CHLORPHENIRAMINE  4 mg    extremely effective and inexpensive over  the counter- may cause drowsiness so start with just a dose or two an hour before bedtime and see how you tolerate it before trying in daytime.            Ozell America, MD 07/20/2024

## 2024-07-20 NOTE — Patient Instructions (Addendum)
 My office will be contacting you by phone for referral to speech therapy for hoarseness and VCD Lupita Connor, SLP  [facility]  [address]  [phone]   Stop pantoprazole  and start Aciphex  20 mg Take 30-60 min before first meal of the day and pepcid  20 mg after supper     For drainage / throat tickle try take CHLORPHENIRAMINE  4 mg    extremely effective and inexpensive over the counter- may cause drowsiness so start with just a dose or two an hour before bedtime and see how you tolerate it before trying in daytime.

## 2024-07-20 NOTE — Assessment & Plan Note (Addendum)
 Trial off acei 05/07/2017 and off losartan  50 05/24/2024  - Allergy screen 05/24/2024 >  Eos 0.1 /  IgE  50 - max gerd rx 05/24/2024  and off losaratan >>> no better 06/29/2024 > refer back to ENT for globus/ throat clearing a - ENT eval ([facility])  07/06/24  rec Speech therapy> 07/20/2024  refer to Lupita Connor  Symptoms are very similar to prior eval in 2018 though pt has little memory of this work up and take gerd rx though PPI appears to be causing diarrhea and I strongly doubt she's been taking it consistently or using hard rock candy to suppress the urge to cough as directed so rec    >>> change ppi to aciphex  20 mg daily ac and stop pantoprazole  but continue h2 at hs  >>> add H1 HS per cough guidelines for pnds/ noct cough   >>> no need for ICS    >>> 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

## 2024-07-20 NOTE — Telephone Encounter (Signed)
Pt seen in clinic 12/9

## 2024-07-20 NOTE — Assessment & Plan Note (Addendum)
 Trial off acei 05/07/2017  and off loartan 05/24/2024  - valsartan  160 mg one daily 05/24/2024 >>>   Adequate control on present rx, reviewed in detail with pt > no change in rx needed  with refill s per PCP or cards

## 2024-07-20 NOTE — Telephone Encounter (Signed)
 FYI Only or Action Required?: FYI only for provider: appointment scheduled on 12/9.  Patient is followed in Pulmonology for Upper airway cough syndrome , last seen on 06/29/2024 by Darlean Ozell NOVAK, MD.  Called Nurse Triage reporting Shortness of Breath.  Symptoms began several months ago.  Interventions attempted: Nebulizer treatments.  Symptoms are: unchanged.  Triage Disposition: See HCP Within 4 Hours (Or PCP Triage)  Patient/caregiver understands and will follow disposition?: Yes        If she talks for a while or with exertion the SOB returns.                  Copied from CRM #[id number]. Topic: Clinical - Red Word Triage >> Jul 20, 2024 12:43 PM Harlene ORN wrote: Red Word that prompted transfer to Nurse Triage: Was told that her lungs are clear with ENT, but she is still having shortness of breath. Reason for Disposition  [1] Longstanding difficulty breathing (e.g., CHF, COPD, emphysema) AND [2] WORSE than normal  Answer Assessment - Initial Assessment Questions 1. RESPIRATORY STATUS: Describe your breathing? (e.g., wheezing, shortness of breath, unable to speak, severe coughing)      SOB on exertion   2. ONSET: When did this breathing problem begin?      X 3-4 months ago, CT scan was clear per the patient   3. PATTERN Does the difficult breathing come and go, or has it been constant since it started?      Intermittent   4. SEVERITY: How bad is your breathing? (e.g., mild, moderate, severe)      Mild-moderate   5. RECURRENT SYMPTOM: Have you had difficulty breathing before? If Yes, ask: When was the last time? and What happened that time?      Yes, ongoing  6. CARDIAC HISTORY: Do you have any history of heart disease? (e.g., heart attack, angina, bypass surgery, angioplasty)      No   7. LUNG HISTORY: Do you have any history of lung disease?  (e.g., pulmonary embolus, asthma, emphysema)     No   8. CAUSE: What do you think is  causing the breathing problem?      Unsure   9. OTHER SYMPTOMS: Do you have any other symptoms? (e.g., chest pain, cough, dizziness, fever, runny nose)     Mild cough noted     Patient called in to triage with complaints of longstanding SOB This has been ongoing for 3-4 months  The patient stated she has had this intermittently for 3-4 months, but is getting progressively worse.  She stated she was referred to ENT with no significant findings. Patient was seen by Pulm on 11/18 for Upper airway cough syndrome.  For home care, the patient is taking her nebulizer treatments.  Appointment scheduled for further evaluation; and agrees with the plan of care, and will reach out if symptoms worsen or persist.  Protocols used: Breathing Difficulty-A-AH

## 2024-07-20 NOTE — Assessment & Plan Note (Addendum)
 Assoc with upper airway cough since feb 2025  - 06/29/2024   Walked on RA  x  3   lap(s) =  approx 450   ft  @ mod to fast  pace, stopped due to end of study  with lowest 02 sats 86%  - CT chest due 07/07/24 with coronary study >>> No ILD - 07/20/2024   Walked on RA   x  2  lap(s) =  approx 300   ft  @ mod  pace, stopped due to SOB   with lowest 02 sats 93%   No further w/u needed for now, will focus instead on rx of UACS  (see sep a/p)    Each maintenance medication was reviewed in detail including emphasizing most importantly the difference between maintenance and prns and under what circumstances the prns are to be triggered using an action plan format where appropriate.  Total time for H and P, chart review, counseling,  directly observing portions of ambulatory 02 saturation study/ and generating customized AVS unique to this office visit / same day charting = 35 min for multiple  refractory respiratory  symptoms of uncertain etiology

## 2024-07-20 NOTE — Telephone Encounter (Signed)
 Copied from CRM #[id number]. Topic: Clinical - Medical Advice >> Jul 20, 2024 12:42 PM Harlene ORN wrote: Reason for CRM: Patient called. Would like a new referral for an ENT in [place] instead of [place]. Reasons why, is because she relies on public transportation, and the ENT that she is currently seeing in [place] that she is having no progress with. Please advise.

## 2024-07-20 NOTE — Telephone Encounter (Signed)
 Called and spoke with patient and asked if she knew of any ENT offices in [place] that are open because I cannot find one online. Patient says she doesn't know any right off but will find out and let us  know.

## 2024-07-21 NOTE — Addendum Note (Signed)
 Addended by: RUFFUS LUKES T on: 07/21/2024 08:10 AM   Modules accepted: Orders

## 2024-07-26 ENCOUNTER — Ambulatory Visit: Payer: Self-pay

## 2024-07-26 NOTE — Telephone Encounter (Signed)
 FYI Only or Action Required?: FYI only for provider: appointment scheduled on tomorrow.  Patient was last seen in primary care on 06/28/2024 by Dettinger, Fonda LABOR, MD.  Called Nurse Triage reporting Cough.  Symptoms began several days ago.  Interventions attempted: OTC medications: cough medicine.  Symptoms are: gradually worsening.  Triage Disposition: See Physician Within 24 Hours  Patient/caregiver understands and will follow disposition?: Yes, will follow disposition  Reason for Disposition  SEVERE coughing spells (e.g., whooping sound after coughing, vomiting after coughing)  Answer Assessment - Initial Assessment Questions 1. ONSET: When did the cough begin?      About 3 days 2. SEVERITY: How bad is the cough today?      mild 3. SPUTUM: Describe the color of your sputum (e.g., none, dry cough; clear, white, yellow, green)     grey 4. HEMOPTYSIS: Are you coughing up any blood? If Yes, ask: How much? (e.g., flecks, streaks, tablespoons, etc.)     denies 5. DIFFICULTY BREATHING: Are you having difficulty breathing? If Yes, ask: How bad is it? (e.g., mild, moderate, severe)      denies 6. FEVER: Do you have a fever? If Yes, ask: What is your temperature, how was it measured, and when did it start?     denies 7. CARDIAC HISTORY: Do you have any history of heart disease? (e.g., heart attack, congestive heart failure)      denies 8. LUNG HISTORY: Do you have any history of lung disease?  (e.g., pulmonary embolus, asthma, emphysema)     States she is being w/u with pulm and others at this time 9. PE RISK FACTORS: Do you have a history of blood clots? (or: recent major surgery, recent prolonged travel, bedridden)     denies 10. OTHER SYMPTOMS: Do you have any other symptoms? (e.g., runny nose, wheezing, chest pain)       Headache when coughing, nose running 12. TRAVEL: Have you traveled out of the country in the last month? (e.g., travel history,  exposures)       denies  Protocols used: Cough - Acute Productive-A-AH

## 2024-07-26 NOTE — Telephone Encounter (Signed)
° °  Summary: Mucus, coughing, chest is somewhat tight but not much but its from the coughing.   Reason for Triage: Mucus, coughing, chest is somewhat tight but not much but its from the coughing. This started Friday, patient has a slight headache but she also thinks its from coughing. Could you assist? Callback number is [phone].

## 2024-07-26 NOTE — Telephone Encounter (Signed)
 Nted. LS

## 2024-07-27 ENCOUNTER — Ambulatory Visit: Payer: Self-pay | Admitting: Family Medicine

## 2024-07-27 ENCOUNTER — Ambulatory Visit

## 2024-07-27 ENCOUNTER — Encounter: Payer: Self-pay | Admitting: Family Medicine

## 2024-07-27 VITALS — BP 125/73 | HR 86 | Temp 96.6°F | Ht 62.0 in | Wt 167.4 lb

## 2024-07-27 DIAGNOSIS — R051 Acute cough: Secondary | ICD-10-CM

## 2024-07-27 DIAGNOSIS — R062 Wheezing: Secondary | ICD-10-CM

## 2024-07-27 DIAGNOSIS — J069 Acute upper respiratory infection, unspecified: Secondary | ICD-10-CM

## 2024-07-27 LAB — VERITOR SARS-COV-2 AND FLU A+B
BD Veritor SARS-CoV-2 Ag: NEGATIVE
Influenza A: NEGATIVE
Influenza B: NEGATIVE

## 2024-07-27 MED ORDER — PREDNISONE 20 MG PO TABS: 20.0000 mg | ORAL_TABLET | Freq: Every day | ORAL | 0 refills | Status: AC

## 2024-07-27 MED ORDER — AZITHROMYCIN 250 MG PO TABS: ORAL_TABLET | ORAL | 0 refills | Status: AC

## 2024-07-27 NOTE — Progress Notes (Signed)
 Subjective:  Patient ID: Felicia Pugh, female    DOB: 1946-07-26, 78 y.o.   MRN: [id number]  Patient Care Team: Dettinger, Fonda LABOR, MD as PCP - General (Family Medicine) Mona Vinie BROCKS, MD as PCP - Cardiology (Cardiology) Shaaron Lamar CHRISTELLA, MD as Consulting Physician (Gastroenterology) Shirlean Therisa ORN, NP (Gastroenterology) Carolee Lynwood JINNY DOUGLAS, MD as Consulting Physician (Orthopedic Surgery) Roddie Bring, DPM as Consulting Physician (Podiatry) Bonner Ade, MD as Consulting Physician (Physical Medicine and Rehabilitation) Vicci Mcardle, [place] (Optometry)   Chief Complaint:  Cough, Nasal Congestion, and Headache (X 4 days )   HPI: Felicia Pugh is a 78 y.o. female presenting on 07/27/2024 for Cough, Nasal Congestion, and Headache (X 4 days )  Felicia Pugh is a 78 year old female who presents with increased cough and sputum production.  She has been experiencing a persistent cough for three to four months, accompanied by a sore feeling in the chest and headaches when coughing. No fever or chills are present. She has been using Robitussin and recently started Mucinex , which she feels is helping. She was previously on a different cough medication prescribed by another doctor but was taken off it recently.  An ENT specialist previously evaluated her, finding her ears and nose to be fine but suspecting the cough might be related to her vocal cords.  She has been experiencing diarrhea, which she attributes to a recent change in medication. However, she acknowledges her history of IBS and Crohn's disease might be contributing. She is currently taking Aciphex  and Pepcid  as prescribed.  She notes that she has not experienced wheezing in the past. She is not coughing up mucus as much as before.          Relevant past medical, surgical, family, and social history reviewed and updated as indicated.  Allergies and medications reviewed and updated. Data reviewed: Chart in  Epic.   Past Medical History:  Diagnosis Date   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 Tona, no longer seeing anyone (02/2015)   Cataract    Complication of anesthesia    difficulty breathing after waking up from anesthesia   Crohn's disease (HCC) 2016   diagnosed in 2016 and started on Colazal , but inflammation/ulceration in ileum/cecum dates back to 2006.   Diabetes mellitus without complication (HCC)    Diabetic neuropathy (HCC)    Hyperlipidemia    Hypertension    IBS (irritable bowel syndrome)    Stroke (HCC) 08/12/2009   Syncope and collapse    Thyroid  nodule    Ulcer     Past Surgical History:  Procedure Laterality Date   ABDOMINAL HYSTERECTOMY     partial   BACK SURGERY     x3 (Dr Marcellus, Dr Gust)   BACK SURGERY  03/2022   BREAST CYST EXCISION Right 2002   COLONOSCOPY  2006   RMR: Normal colon except for ileocecal valve, eroded/ulcerated areas at ileocecal valve with mucosal hemorrhage present status post biopsy. ulcer (ileocecal valve, biopsy): Chronic active inflammation with ulceration, nonspecific.   COLONOSCOPY  09/06/2009   RMR: Suboptimal prep on the right side. Ulcers noted at IC valve, base of cecum, and scattered through food part of ascending colon. Pathology wiht chronic active colitis, question Crohn's   COLONOSCOPY N/A 03/27/2015   Dr. Shaaron: abnormal cecum and IC valve most consistent with IBD. TI intubated and appeared normal. Likely Crohn's disease. Patient denies NSAID use. No improvement with Lialda samples  at time of initial diagnosis   COLONOSCOPY N/A 05/15/2017   Dr. Shaaron: ab normal IC valve, s/p biopsy with active ileitis, diverticulosis in sigmoid and descending colon. Non-bleeding internal hemorrhoids   ESOPHAGOGASTRODUODENOSCOPY     HAND SURGERY Right    Carpel tunnel and right elbow   LAPAROSCOPIC APPENDECTOMY N/A 07/21/2016   Procedure: APPENDECTOMY LAPAROSCOPIC;  Surgeon: Oneil Budge, MD;  Location: [facility];  Service:  General;  Laterality: N/A;   Small bowel capsule endoscopy  06/10/2005   RMR: single erosion versus AVM, distal ileum   Small bowel capsule endoscopy  prior to 05/2005   [place]: reported erosions/ulcerations per medical record. actual report unavailable.    Social History   Socioeconomic History   Marital status: Widowed    Spouse name: Lamar   Number of children: 5   Years of education: 9th grade-then got her GED at [employer]   Highest education level: GED or equivalent  Occupational History   Occupation: retired  Tobacco Use   Smoking status: Never   Smokeless tobacco: Never  Vaping Use   Vaping status: Never Used  Substance and Sexual Activity   Alcohol use: No   Drug use: No   Sexual activity: Not Currently  Other Topics Concern   Not on file  Social History Narrative   Lives home with her husband - he is on dialysis - she helps care for him   Their children live nearby   She babysits her 61 year old grandchild   Social Drivers of Health   Tobacco Use: Low Risk (07/27/2024)   Patient History    Smoking Tobacco Use: Never    Smokeless Tobacco Use: Never    Passive Exposure: Not on file  Financial Resource Strain: Low Risk (06/02/2024)   Overall Financial Resource Strain (CARDIA)    Difficulty of Paying Living Expenses: Not hard at all  Food Insecurity: No Food Insecurity (06/02/2024)   Epic    Worried About Radiation Protection Practitioner of Food in the Last Year: Never true    Ran Out of Food in the Last Year: Never true  Transportation Needs: No Transportation Needs (06/02/2024)   Epic    Lack of Transportation (Medical): No    Lack of Transportation (Non-Medical): No  Physical Activity: Insufficiently Active (06/02/2024)   Exercise Vital Sign    Days of Exercise per Week: 3 days    Minutes of Exercise per Session: 30 min  Stress: No Stress Concern Present (06/02/2024)   [employer] of Occupational Health - Occupational Stress Questionnaire    Feeling of Stress: Not at all   Social Connections: Moderately Isolated (06/03/2023)   Social Connection and Isolation Panel    Frequency of Communication with Friends and Family: More than three times a week    Frequency of Social Gatherings with Friends and Family: More than three times a week    Attends Religious Services: More than 4 times per year    Active Member of [employer] or Organizations: No    Attends Banker Meetings: Never    Marital Status: Widowed  Intimate Partner Violence: Not At Risk (06/02/2024)   Epic    Fear of Current or Ex-Partner: No    Emotionally Abused: No    Physically Abused: No    Sexually Abused: No  Depression (PHQ2-9): Low Risk (06/28/2024)   Depression (PHQ2-9)    PHQ-2 Score: 1  Alcohol Screen: Low Risk (06/02/2024)   Alcohol Screen    Last Alcohol Screening Score (AUDIT):  0  Housing: Unknown (06/02/2024)   Epic    Unable to Pay for Housing in the Last Year: No    Number of Times Moved in the Last Year: Not on file    Homeless in the Last Year: No  Utilities: Not At Risk (06/02/2024)   Epic    Threatened with loss of utilities: No  Health Literacy: Adequate Health Literacy (06/02/2024)   B1300 Health Literacy    Frequency of need for help with medical instructions: Never    Outpatient Encounter Medications as of 07/27/2024  Medication Sig   acetaminophen  (TYLENOL ) 650 MG CR tablet Take 650 mg by mouth every 8 (eight) hours as needed for pain.   Apoaequorin (PREVAGEN) 10 MG CAPS Take by mouth.   atorvastatin  (LIPITOR) 40 MG tablet Take 1 tablet (40 mg total) by mouth daily.   azithromycin  (ZITHROMAX ) 250 MG tablet Take 2 tablets on day 1, then 1 tablet daily on days 2 through 5   balsalazide (COLAZAL ) 750 MG capsule TAKE 2 CAPSULES BY MOUTH IN THE MORNING AND AT BEDTIME   Empagliflozin -linaGLIPtin  (GLYXAMBI ) 25-5 MG TABS Take 1 tablet by mouth daily.   famotidine  (PEPCID ) 20 MG tablet One after supper   glucose blood (ONETOUCH VERIO) test strip CHECK GLUCOSE  TWICE DAILY Dx E11.69   insulin  degludec (TRESIBA  FLEXTOUCH) 100 UNIT/ML FlexTouch Pen INJECT 40 UNITS SUBCUTANEOUSLY ONCE DAILY   Insulin  Pen Needle (PEN NEEDLES) 31G X 5 MM MISC Use to give insulin  with flexpen   Insulin  Syringe-Needle U-100 31G X 1/4 1 ML MISC 1 Syringe by Does not apply route 2 (two) times daily.   Multiple Vitamins-Minerals (WOMENS 50+ MULTI VITAMIN/MIN PO) Take by mouth.   predniSONE  (DELTASONE ) 20 MG tablet Take 1 tablet (20 mg total) by mouth daily with breakfast for 5 days.   RABEprazole  (ACIPHEX ) 20 MG tablet Take 1 tablet (20 mg total) by mouth daily.   valsartan  (DIOVAN ) 160 MG tablet Take 1 tablet (160 mg total) by mouth daily.   [DISCONTINUED] benzonatate  (TESSALON ) 200 MG capsule Take 1 capsule (200 mg total) by mouth 3 (three) times daily as needed for cough.   [DISCONTINUED] fluticasone  (FLONASE ) 50 MCG/ACT nasal spray Place 1 spray into both nostrils 2 (two) times daily as needed for allergies or rhinitis.   No facility-administered encounter medications on file as of 07/27/2024.    Allergies[1]  Pertinent ROS per HPI, otherwise unremarkable      Objective:  BP 125/73   Pulse 86   Temp (!) 96.6 F (35.9 C)   Ht 5' 2 (1.575 m)   Wt 167 lb 6.4 oz (75.9 kg)   SpO2 94%   BMI 30.62 kg/m    Wt Readings from Last 3 Encounters:  07/27/24 167 lb 6.4 oz (75.9 kg)  07/20/24 170 lb (77.1 kg)  06/29/24 169 lb 12.8 oz (77 kg)    Physical Exam Vitals and nursing note reviewed.  Constitutional:      General: She is not in acute distress.    Appearance: Normal appearance. She is obese. She is not ill-appearing, toxic-appearing or diaphoretic.  HENT:     Head: Normocephalic and atraumatic.     Right Ear: Tympanic membrane, ear canal and external ear normal.     Left Ear: Tympanic membrane, ear canal and external ear normal.     Nose: Nose normal.     Mouth/Throat:     Mouth: Mucous membranes are moist.     Pharynx: Oropharynx is clear. No  oropharyngeal exudate or posterior oropharyngeal erythema.  Eyes:     Conjunctiva/sclera: Conjunctivae normal.     Pupils: Pupils are equal, round, and reactive to light.  Cardiovascular:     Rate and Rhythm: Normal rate and regular rhythm.     Heart sounds: Normal heart sounds.  Pulmonary:     Effort: Pulmonary effort is normal. No respiratory distress.     Breath sounds: Wheezing present. No rhonchi or rales.     Comments: Cough Musculoskeletal:     Cervical back: Neck supple.     Right lower leg: No edema.     Left lower leg: No edema.  Skin:    General: Skin is warm and dry.     Capillary Refill: Capillary refill takes less than 2 seconds.  Neurological:     General: No focal deficit present.     Mental Status: She is alert and oriented to person, place, and time.  Psychiatric:        Mood and Affect: Mood normal.        Behavior: Behavior normal.        Thought Content: Thought content normal.        Judgment: Judgment normal.      Results for orders placed or performed in visit on 06/28/24  Bayer DCA Hb A1c Waived   Collection Time: 06/28/24  9:21 AM  Result Value Ref Range   HB A1C (BAYER DCA - WAIVED) 7.1 (H) 4.8 - 5.6 %       Pertinent labs & imaging results that were available during my care of the patient were reviewed by me and considered in my medical decision making.  Assessment & Plan:  Felicia Pugh was seen today for cough, nasal congestion and headache.  Diagnoses and all orders for this visit:  Acute cough -     Veritor SARS-CoV-2 and Flu A+B -     azithromycin  (ZITHROMAX ) 250 MG tablet; Take 2 tablets on day 1, then 1 tablet daily on days 2 through 5 -     predniSONE  (DELTASONE ) 20 MG tablet; Take 1 tablet (20 mg total) by mouth daily with breakfast for 5 days.  Wheezing -     azithromycin  (ZITHROMAX ) 250 MG tablet; Take 2 tablets on day 1, then 1 tablet daily on days 2 through 5 -     predniSONE  (DELTASONE ) 20 MG tablet; Take 1 tablet (20  mg total) by mouth daily with breakfast for 5 days.  URI with cough and congestion -     azithromycin  (ZITHROMAX ) 250 MG tablet; Take 2 tablets on day 1, then 1 tablet daily on days 2 through 5 -     predniSONE  (DELTASONE ) 20 MG tablet; Take 1 tablet (20 mg total) by mouth daily with breakfast for 5 days.       Acute upper respiratory infection Increased cough, sputum production, and wheezing in the left lung. COVID and flu tests are negative. Symptoms have persisted for three to four months, possibly related to vocal cord issues or reflux. Recent medication changes include Aciphex  and pepcid , with no improvement. Wheezing is atypical for her. Prednisone  may elevate blood sugar levels, necessitating careful monitoring. - Prescribed prednisone  20 mg daily for 5 days to address wheezing, cough, and congestion. - Prescribed azithromycin  for 5 days to treat the infection. - Continue current medications as prescribed by Dr. Peg. - Monitor blood sugar levels due to potential increase from prednisone .          Continue  all other maintenance medications.  Follow up plan: Return if symptoms worsen or fail to improve.   Continue healthy lifestyle choices, including diet (rich in fruits, vegetables, and lean proteins, and low in salt and simple carbohydrates) and exercise (at least 30 minutes of moderate physical activity daily).   The above assessment and management plan was discussed with the patient. The patient verbalized understanding of and has agreed to the management plan. Patient is aware to call the clinic if they develop any new symptoms or if symptoms persist or worsen. Patient is aware when to return to the clinic for a follow-up visit. Patient educated on when it is appropriate to go to the emergency department.   Rosaline Bruns, FNP-C [facility] [phone]     [1]  Allergies Allergen Reactions   Metformin  And Related     Severe diarrhea   Asa  [Aspirin]     GI Dr does not want her to take due to hx ulcers   Nsaids Other (See Comments)    GI Dr does not want her to take due to hx of ulcers Instructed by GI not to take    Other Diarrhea    Steroids   Tolmetin Other (See Comments)    GI Dr does not want her to take due to hx of ulcers GI Dr does not want her to take due to hx of ulcers

## 2024-08-03 ENCOUNTER — Ambulatory Visit (INDEPENDENT_AMBULATORY_CARE_PROVIDER_SITE_OTHER): Admitting: Gastroenterology

## 2024-08-03 ENCOUNTER — Encounter: Payer: Self-pay | Admitting: Gastroenterology

## 2024-08-03 VITALS — BP 126/73 | HR 89 | Temp 98.4°F | Ht 62.5 in | Wt 169.2 lb

## 2024-08-03 DIAGNOSIS — R490 Dysphonia: Secondary | ICD-10-CM | POA: Diagnosis not present

## 2024-08-03 DIAGNOSIS — K5080 Crohn's disease of both small and large intestine without complications: Secondary | ICD-10-CM | POA: Diagnosis not present

## 2024-08-03 DIAGNOSIS — R059 Cough, unspecified: Secondary | ICD-10-CM

## 2024-08-03 DIAGNOSIS — K219 Gastro-esophageal reflux disease without esophagitis: Secondary | ICD-10-CM

## 2024-08-03 NOTE — Patient Instructions (Addendum)
 Continue balsalazide 2 capsules in the morning and two in the evening.   Continue rabeprazole  in the morning and famotidine  at dinner to see if this helps with hoarseness. 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 See ENT for procedure as planned. 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   Plan for office visit in one year but call sooner if you need any adjustments in reflux medication or have issues with your Crohn's.

## 2024-08-03 NOTE — Progress Notes (Signed)
 "    GI Office Note    Referring Provider: Dettinger, Fonda LABOR, MD Primary Care Physician:  Dettinger, Fonda LABOR, MD  Primary Gastroenterologist: Ozell Hollingshead, MD   Chief Complaint   Chief Complaint  Patient presents with   Follow-up    History of Present Illness   Felicia Pugh is a 78 y.o. female presenting today for follow up. Last seen 06/2023.  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   Discussed the use of AI scribe software for clinical note transcription with the patient, who gave verbal consent to proceed.  History of Present Illness Felicia Pugh why-oh-me is a 78 year old female with ileocolonic Crohn's disease and irritable bowel syndrome presenting for annual follow-up.  She has longstanding ileocolonic Crohn's disease and irritable bowel syndrome with stable bowel habits of 1 to 2 bowel movements most mornings. With travel or anxiety she may have up to 2 or 3 bowel movements before leaving home and uses liquid Imodium  as needed, including prophylactically, with good control. She denies blood in stool, significant abdominal pain, or weight loss. Fruit reliably increases bowel movements within 2 hours.  She continues her Crohn's regimen balsalazide 750mg  two tablets in the morning and two at night without side effects.  Over the past 6 months she has had persistent hoarseness and cough. She has seen pulmonologist, Dr. 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 She is on rabeprazole  20mg  in the AM and famotidine  at supper for the past four weeks without notable improvement. . She saw ENT and had flexible laryngoscopy that showed unremarkable vocal cords, hypopharynx. She was advised to consider consultation with laryngology.  She denies heartburn,  burning, dysphagia, or weight loss.  Her weight is stable around 169 lb, though she notes increased central adiposity.       Prior Data     Results  07/06/2024: Flexible laryngoscopy shows patent anterior nasal cavity with minimal crusting, no discharge or infection.  Normal base of tongue and supraglottis Normal vocal cord mobility without vocal cord nodule, mass, polyp or tumor. Hypopharynx normal without mass, pooling of secretions or aspiration.   CT A/P with contrast 10/2022: IMPRESSION: 1. No acute findings within the abdomen or pelvis. 2. Status post appendectomy and hysterectomy. 3. 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 4. No findings to suggest active Crohn's disease at this time. No evidence for bowel obstruction or inflammation. 5. Status post posterior hardware fixation of L2 through L5. 6.  Aortic Atherosclerosis (ICD10-I70.0).   Colonoscopy October 2018: - abnormal ileocecal valve?biopsied, focal active ileitis on biopsy - Diverticulosis in the sigmoid colon and in the descending colon. The examination was otherwise normal on direct and retroflexion views. - Non-bleeding internal hemorrhoids. -5 year surveillance colonoscopy recommended      Medications   Current Outpatient Medications  Medication Sig Dispense Refill   acetaminophen  (TYLENOL ) 650 MG CR tablet Take 650 mg by mouth every 8 (eight) hours as needed for pain.     atorvastatin  (LIPITOR) 40 MG tablet Take 1 tablet (40 mg total) by mouth daily. 90 tablet 3   balsalazide (COLAZAL ) 750 MG capsule TAKE 2 CAPSULES BY MOUTH IN THE MORNING AND AT BEDTIME 360 capsule 3   Empagliflozin -linaGLIPtin  (GLYXAMBI ) 25-5 MG TABS  Take 1 tablet by mouth daily. 90 tablet 3   famotidine  (PEPCID ) 20 MG tablet One after supper 30 tablet 11   glucose blood (ONETOUCH VERIO) test strip CHECK GLUCOSE TWICE DAILY Dx E11.69 200 each 3   insulin   degludec (TRESIBA  FLEXTOUCH) 100 UNIT/ML FlexTouch Pen INJECT 40 UNITS SUBCUTANEOUSLY ONCE DAILY 30 mL 1   Insulin  Pen Needle (PEN NEEDLES) 31G X 5 MM MISC Use to give insulin  with flexpen 100 each 3   Insulin  Syringe-Needle U-100 31G X 1/4 1 ML MISC 1 Syringe by Does not apply route 2 (two) times daily. 60 each 6   Multiple Vitamins-Minerals (WOMENS 50+ MULTI VITAMIN/MIN PO) Take by mouth.     pantoprazole  (PROTONIX ) 40 MG tablet Take 40 mg by mouth every morning.     valsartan  (DIOVAN ) 160 MG tablet Take 1 tablet (160 mg total) by mouth daily.     No current facility-administered medications for this visit.    Allergies   Allergies as of 08/03/2024 - Review Complete 08/03/2024  Allergen Reaction Noted   Metformin  and related  05/11/2014   Asa [aspirin]  11/04/2012   Nsaids Other (See Comments) 11/04/2012   Other Diarrhea 12/25/2014   Tolmetin Other (See Comments) 11/04/2012      Review of Systems   General: Negative for anorexia, weight loss, fever, chills, fatigue, weakness. ENT: Negative for difficulty swallowing , nasal congestion.see hpi CV: Negative for chest pain, angina, palpitations, dyspnea on exertion, peripheral edema.  Respiratory: Negative for dyspnea at rest, dyspnea on exertion, sputum, wheezing. See hpi GI: See history of present illness. GU:  Negative for dysuria, hematuria, urinary incontinence, urinary frequency, nocturnal urination.  Endo: Negative for unusual weight change.     Physical Exam   BP 126/73   Pulse 89   Temp 98.4 F (36.9 C) (Oral)   Ht 5' 2.5 (1.588 m)   Wt 169 lb 3.2 oz (76.7 kg)   SpO2 93%   BMI 30.45 kg/m    General: Well-nourished, well-developed in no acute distress.  Eyes: No icterus. Mouth: Oropharyngeal mucosa moist and pink.  Abdomen: Bowel sounds are normal, nontender, nondistended, no hepatosplenomegaly or masses,  no abdominal bruits or hernia , no rebound or guarding.  Rectal: not performed Extremities: No lower  extremity edema. No clubbing or deformities. Neuro: Alert and oriented x 4   Skin: Warm and dry, no jaundice.   Psych: Alert and cooperative, normal mood and affect.  Labs   Lab Results  Component Value Date   NA 142 06/23/2024   CL 102 06/23/2024   K 3.7 06/23/2024   CO2 26 06/23/2024   BUN 12 06/23/2024   CREATININE 0.72 06/23/2024   EGFR 86 06/23/2024   CALCIUM  9.2 06/23/2024   ALBUMIN 4.1 08/18/2023   GLUCOSE 119 (H) 06/23/2024   Lab Results  Component Value Date   WBC 7.8 05/24/2024   HGB 12.2 05/24/2024   HCT 39.9 05/24/2024   MCV 91 05/24/2024   PLT 249 05/24/2024   Lab Results  Component Value Date   ALT 11 03/08/2024   AST 16 03/08/2024   ALKPHOS 108 03/08/2024   BILITOT 0.5 03/08/2024   Lab Results  Component Value Date   HGBA1C 7.1 (H) 06/28/2024   Lab Results  Component Value Date   TSH 1.36 03/08/2024    Imaging Studies   CT CORONARY MORPH W/CTA COR W/SCORE W/CA W/CM &/OR WO/CM Addendum Date: 07/11/2024 ADDENDUM REPORT: 07/11/2024 00:49 EXAM: OVER-READ INTERPRETATION  CT CHEST The  following report is an over-read performed by radiologist Dr. Suzen Dials of [facility] on 07/11/2024. This over-read does not include interpretation of cardiac or coronary anatomy or pathology. The coronary calcium  score/coronary CTA interpretation by the cardiologist is attached. COMPARISON:  March 17, 2017 FINDINGS: Cardiovascular: There are no significant extracardiac vascular findings. Mediastinum/Nodes: There are no enlarged lymph nodes within the visualized mediastinum. Lungs/Pleura: There is no pleural effusion. There is a stable, likely benign 6 mm anterolateral pleural based right middle lobe pulmonary nodule (axial CT image 102, CT series 304). Mild elevation of the left hemidiaphragm is noted with mild left basilar atelectasis. Upper abdomen: Noninflamed diverticula are seen along the splenic flexure. Musculoskeletal/Chest wall: No chest wall mass  or suspicious osseous findings within the visualized chest. IMPRESSION: 1. Stable, likely benign 6 mm right middle lobe pulmonary nodule. 2. Mild left basilar atelectasis. 3. Colonic diverticulosis. Electronically Signed   By: Suzen Dials M.D.   On: 07/11/2024 00:49   Result Date: 07/11/2024 HISTORY: chest tightness EXAM: Cardiac/Coronary  CT PROTOCOL: A non-contrast, gated CT scan was obtained with axial slices of 2.5 mm through the heart for calcium  scoring. Calcium  scoring was performed using the Agatston method. A 120 kV prospective, gated, contrast cardiac CT scan was obtained. Gantry rotation speed was 230 msec and collimation was 0.63 mm. Two sublingual nitroglycerin  tablets (0.8 mg) were given. The 3D data set was reconstructed with motion correction for the best systolic or diastolic phase. Images were analyzed on a dedicated workstation using MPR, MIP, and VRT modes. The patient received 95 cc of contrast. FINDINGS: Image quality: Average. Artifact: Limited. Coronary calcium  score is 53.6, which places the patient in the 55th percentile for age and sex matched control. Coronary arteries: Normal coronary origins.  Left dominance. Left Main Coronary Artery: Normal caliber vessel, originates from the left coronary cusp, bifurcates to form a left anterior descending artery (LAD) and a left circumflex artery (LCX). Mild stenosis (25-49%) mixed, predominantly calcified disease at the ostial left main. Left Anterior Descending Artery: Normal caliber vessel, wraps the apex, gives off 3 diagonal branches. LAD is patent. First Diagonal branch: Patent Second Diagonal branch: Patent Third Diagonal branch: Patent Left Circumflex Artery: Normal caliber vessel, dominant, gives off 2 obtuse marginal branches. LCX is patent. First Obtuse Marginal branch: Patent Second Obtuse Marginal branch: Patent Right Coronary Artery: Non-dominant vessel, originates from the right coronary cusp, patent. Aorta: Normal caliber at  the mid ascending aorta (level of the PA bifurcation) measured double oblique. Aortic atherosclerosis. Aortic Valve: Native valve, trileaflet aortic valve, no calcification. Mitral valve: Native valve, mitral annular calcification. Other findings: Normal pulmonary vein drainage into the left atrium. Normal left atrial appendage without thrombus. Main pulmonary artery is dilated (36.5 mm) suggestive of increased pulmonary pressures, evaluate for pulmonary hypertension. Clinical correlation required. Elevation of the left hemidiaphragm on scout imaging. Please see separate report from [facility] for non-cardiac findings. IMPRESSION: 1. Coronary calcium  score of 53.6. This was 55th percentile for age and sex matched control. 2. Normal coronary origins with left dominance. 3. CAD-RADS 2 Mild non-obstructive CAD. 4. Mild stenosis (25-49%) mixed, predominantly calcified disease at the ostial left main. 5. Aortic atherosclerosis. 6. Main pulmonary artery is dilated (36.5 mm) suggestive of increased pulmonary pressures, evaluate for pulmonary hypertension. Clinical correlation required. 7. Elevation of the left hemidiaphragm on scout imaging. RECOMMENDATION: Mild (25-49%) predominant calcified left main plaque. CT FFR is limited in this setting and can not reliably exclude functional significance; correlate  clinically. Would recommend invasive angiography with possible IVUS if symptoms persist. Continue anti-anginal therapies and risk factor modification for secondary prevention. Electronically Signed: By: Madonna Large On: 07/09/2024 11:05    Assessment/Plan:    Assessment & Plan Ileocolonic Crohn's disease Well-controlled without acute flare, complications, or medication adverse effects. - Reviewed and confirmed adherence to balsalazide.  - Assessed for medication side effects and disease complications; none reported. - Planned annual follow-up unless symptoms worsen or new issues arise.  Irritable  bowel syndrome Chronic, mild intermittent symptoms without alarming features. - Discussed use of loperamide  as needed for symptomatic management, particularly prior to travel or stressful events. - Reviewed dosing and confirmed liquid formulation is acceptable for rapid onset. - Provided guidance on administration timing (30 minutes prior to travel).  Cough/hoarseness -concerns for possible LPR per patient. She has seen pulmonology and ENT. ENT evaluate of vocal cords without significant findings -she has been on PPI in AM and H2 blocker in PM for about four weeks. May take up to 3 months to resolve symptoms.  -encouraged her to follow up with ENT recommendations to see laryngology.  -if acid exposure felt to be source and no improvement on current regimen, she could consider PPI BID vs trial of Voquezna.  -ov prn.            Sonny RAMAN. Ezzard, MHS, PA-C [facility]  "

## 2024-08-25 ENCOUNTER — Other Ambulatory Visit: Payer: Self-pay

## 2024-08-25 ENCOUNTER — Encounter (HOSPITAL_COMMUNITY): Payer: Self-pay | Admitting: Speech Pathology

## 2024-08-25 ENCOUNTER — Ambulatory Visit (HOSPITAL_COMMUNITY): Attending: Internal Medicine | Admitting: Speech Pathology

## 2024-08-25 DIAGNOSIS — R490 Dysphonia: Secondary | ICD-10-CM | POA: Diagnosis present

## 2024-08-25 NOTE — Therapy (Signed)
 " OUTPATIENT SPEECH LANGUAGE PATHOLOGY VOICE EVALUATION   Patient Name: Felicia Pugh MRN: [id number] DOB:Nov 03, 1945, 79 y.o., female Today's Date: 08/25/2024  PCP: Dettinger, Fonda LABOR, MD REFERRING PROVIDER: Darlean Ozell NOVAK, MD  END OF SESSION:  End of Session - 08/25/24 1036     Visit Number 1    Number of Visits 6    Authorization Type [employer] PPO    SLP Start Time [phone]    SLP Stop Time  1036    SLP Time Calculation (min) 48 min    Activity Tolerance Patient tolerated treatment well         Through 09/30/2024  Past Medical History:  Diagnosis Date   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 Tona, no longer seeing anyone (02/2015)   Cataract    Complication of anesthesia    difficulty breathing after waking up from anesthesia   Crohn's disease (HCC) 2016   diagnosed in 2016 and started on Colazal , but inflammation/ulceration in ileum/cecum dates back to 2006.   Diabetes mellitus without complication (HCC)    Diabetic neuropathy (HCC)    Hyperlipidemia    Hypertension    IBS (irritable bowel syndrome)    Stroke (HCC) 08/12/2009   Syncope and collapse    Thyroid  nodule    Ulcer    Past Surgical History:  Procedure Laterality Date   ABDOMINAL HYSTERECTOMY     partial   BACK SURGERY     x3 (Dr Marcellus, Dr Gust)   BACK SURGERY  03/2022   BREAST CYST EXCISION Right 2002   COLONOSCOPY  2006   RMR: Normal colon except for ileocecal valve, eroded/ulcerated areas at ileocecal valve with mucosal hemorrhage present status post biopsy. ulcer (ileocecal valve, biopsy): Chronic active inflammation with ulceration, nonspecific.   COLONOSCOPY  09/06/2009   RMR: Suboptimal prep on the right side. Ulcers noted at IC valve, base of cecum, and scattered through food part of ascending colon. Pathology wiht chronic active colitis, question Crohn's   COLONOSCOPY N/A 03/27/2015   Dr. Shaaron: abnormal cecum and IC valve most consistent with IBD. TI intubated  and appeared normal. Likely Crohn's disease. Patient denies NSAID use. No improvement with Lialda samples at time of initial diagnosis   COLONOSCOPY N/A 05/15/2017   Dr. Shaaron: ab normal IC valve, s/p biopsy with active ileitis, diverticulosis in sigmoid and descending colon. Non-bleeding internal hemorrhoids   ESOPHAGOGASTRODUODENOSCOPY     HAND SURGERY Right    Carpel tunnel and right elbow   LAPAROSCOPIC APPENDECTOMY N/A 07/21/2016   Procedure: APPENDECTOMY LAPAROSCOPIC;  Surgeon: Oneil Budge, MD;  Location: [facility];  Service: General;  Laterality: N/A;   Small bowel capsule endoscopy  06/10/2005   RMR: single erosion versus AVM, distal ileum   Small bowel capsule endoscopy  prior to 05/2005   [place]: reported erosions/ulcerations per medical record. actual report unavailable.   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, with long-term current use of insulin  (HCC) 12/12/2023   Encounter for long-term (current) use of insulin  (HCC) 02/26/2023   Crohn's disease of both small and large intestine without complication (HCC) 06/07/2021   Loose stools 06/07/2021   Bilateral carpal tunnel syndrome 10/17/2020   Upper airway cough syndrome 05/08/2017   Obesity (BMI 30-39.9) 05/08/2017   Solitary pulmonary nodule 05/08/2017   GERD (gastroesophageal reflux disease) 05/02/2017   Type  2 diabetes mellitus with other specified complication (HCC) 05/16/2015   Crohn's disease with complication (HCC) 05/16/2015   Diverticulosis of colon without hemorrhage    History of stroke 07/08/2014   Osteopenia 05/11/2014   Hyperlipidemia associated with type 2 diabetes mellitus (HCC) 06/01/2008   Anemia, chronic disease 06/01/2008   Hypertension associated with diabetes (HCC) 06/01/2008    Onset date: ~09/2023  REFERRING DIAG: 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  THERAPY  DIAG:  Hoarseness  Rationale for Evaluation and Treatment: Rehabilitation  SUBJECTIVE:   SUBJECTIVE STATEMENT: My voice has gotten worse.  Pt accompanied by: self  PERTINENT HISTORY: Felicia Pugh is a 79 yo female who was referred by Dr. Michael Wert for a voice evaluation and treatment due to continuing issues with hoarseness and cough. She states that it is somewhat variable and that some days are better than others. She is seeing Dr. Darlean for upper airway cough syndrome and is now taking Achiphex.  PAIN:  Are you having pain? No  FALLS: Has patient fallen in last 6 months? No  LIVING ENVIRONMENT: Lives with: lives alone Lives in: House/apartment  PLOF:Level of assistance: Independent with ADLs, Independent with IADLs Employment: Retired  PATIENT GOALS: Improve vocal quality  OBJECTIVE:  Note: Objective measures were completed at Evaluation unless otherwise noted.  DIAGNOSTIC FINDINGS:   Flexible Laryngoscopy Procedure Note  Flexible laryngoscopy shows patent anterior nasal cavity with minimal crusting, no discharge or infection.  Normal base of tongue and supraglottis Normal vocal cord mobility without vocal cord nodule, mass, polyp or tumor. Hypopharynx normal without mass, pooling of secretions or aspiration.   COGNITION: Overall cognitive status: Within functional limits for tasks assessed Areas of impairment:  N/A  SOCIAL HISTORY: Occupation: retired Water  intake: 64 oz a day with mix in powder Caffeine/alcohol intake: minimal Daily voice use: minimal  PERCEPTUAL VOICE ASSESSMENT: Voice quality: hoarse, strained, and vocal fatigue Vocal abuse: previous excessive coughing Resonance: normal Respiratory function: clavicular breathing and speaking on residual capacity  OBJECTIVE VOICE ASSESSMENT: Maximum phonation time for sustained ah: 2 seconds dysphonic Conversational pitch average: 225 Hz Conversational pitch range: 104-435 Hz Conversational  loudness average: 63.3 dB Conversational loudness range: 57-71 dB S/z ratio: TBA (Suggestive of dysfunction >1.0)  PATIENT REPORTED OUTCOME MEASURES (PROM): VHI: 18 and RSI: 22  The Voice Handicap Index-10 (VHI-10) was administered. This survey is a series of questions targeting the patient's perception of his/her own voice using a scale of 0-4 (0=Never, 4=Always). Score greater than 11 is abnormal. My voice makes it difficult for people to hear me. 2  People have difficulty understanding me in a noisy room. 2  My voice difficulties restrict personal and social life. 2 I feel left out of conversations because of my voice. 0  My voice problem causes me to lose income. 0  I feel as though I have to strain to produce voice. 3  The clarity of my voice is unpredictable. 4  My voice problem upsets me. 2  My voice makes me feel handicapped. 0  People ask, What's wrong with your voice? 3  TOTAL SCORE:  [X]  Abnormal (raw score >11) []  Normal 18/40   Reflux Symptom Index Hoarseness or a problem with your voice 4 Clearing your throat 2 Excess throat mucous or postnasal drip 2 Difficulty swallowing food, liquids, or pills 0 Coughing after you eat or after lying down 4 Breathing difficulties or choking episodes 1 Troublesome or annoying cough 5 Sensation of something sticking in  your throat or a lump in your throat 4 Heartburn, chest pain, indigestion, or stomach acid coming up 0 TOTAL SCORE: 22 [X]  Abnormal (raw score >13) []  Normal  Normative data suggests that an RSI of greater than or equal to 13 is clinically significant  Therefore, an RSI > 13 may be indicative of significant reflux disease.                                                                                                                             TREATMENT DATE: 08/25/2024 Evaluation only completed this date  PATIENT EDUCATION: Education details: Plan for trial of SLP voice therapy to address deficits Person  educated: Patient Education method: Explanation and Verbal cues Education comprehension: verbalized understanding  HOME EXERCISE PROGRAM: Pt will completed HEP as assigned to facilitate carryover of treatment strategies and techniques in home and community environment with written cues.  GOALS: Goals reviewed with patient? Yes  SHORT TERM GOALS: Target date: 09/30/24  Regularly practice voice building/strengthening exercises a minimum of 5 days/week for 20+ minutes a day. Baseline: Introduced Goal status: NEW   Report understanding of management of laryngopharyngeal reflux through dietary and behavioral modifications. a. Take reflux medication correctly 80% of the time (30-60 minutes before a meal) b. Elevate the head of the bed by 4-6 c. Avoid eating 2-3 hours before lying down 80% of the time   Baseline: Introduced today Goal status: NEW   3. The patient will implement appropriate vocal hygiene recommendations on a daily basis.  Baseline: Introduced today Goal status: NEW  4.  Patient will demonstrate balanced coordination of respiration with phonation, as demonstrated by continuous periodicity (no glottal fry) with 85% or more accuracy at the sentence, paragraph level and in conversation.  Baseline: significant dysphonia throughout Goal status: NEW   5.  Decrease throat clearing/cough by 80% via reduction in laryngeal hypersensitivity and suppression via: sip liquid, swallow, follow reflux recommendations, increase hydration to 48+oz of water , and improve breath support during speech (not speaking to end of breath). Baseline: Introduced Goal status: NEW   LONG TERM GOALS:  Same as short term goals  ASSESSMENT:  CLINICAL IMPRESSION: Patient is a 79 y.o. female who was seen today for a voice evaluation following referral from Dr. Darlean. 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 Recommend trial period of voice therapy to target the above stated goals. Pt may benefit from a voice evaluation at [facility] and can be seen her for treatment. She stated that she does not want to drive to [place] and would like to try trial period of SLP therapy here.   OBJECTIVE IMPAIRMENTS: include voice disorder. These impairments are limiting patient from effectively communicating at home and in community. Factors affecting potential to achieve goals and functional outcome are previous level of function.. Patient will benefit from skilled SLP services  to address above impairments and improve overall function.  REHAB POTENTIAL: Good  PLAN:  SLP FREQUENCY: 1x/week  SLP DURATION: 6 weeks  PLANNED INTERVENTIONS: Cueing hierachy, Internal/external aids, Functional tasks, Multimodal communication approach, SLP instruction and feedback, Compensatory strategies, Patient/family education, [phone] Treatment of speech (30 or 45 min) , and [id number]- Speech Eval Behavioral Qualitative Voice Resonance  Thank you,  Lamar Candy, CCC-SLP [phone]   Juston Goheen, CCC-SLP 08/25/2024, 10:38 AM  Mylene Barrows Request Treatment Start Date: 09/02/2024  Referring diagnosis code (ICD 10)? J38.5, R49.0 Treatment diagnosis codes (ICD 10)? (if different than referring diagnosis) R49.0 What was this (referring dx) caused by? []  Surgery []  Fall []  Ongoing issue []  Arthritis [x]  Other: __unknown__________  Laterality: []  Rt []  Lt [x]  Both  Deficits: []  Pain []  Stiffness []  Weakness []  Edema []  Balance Deficits []  Coordination []  Gait Disturbance []  ROM [x]  Other  CPT codes: See Planned Interventions listed in the Plan section of the Evaluation.      "

## 2024-09-02 ENCOUNTER — Encounter (HOSPITAL_COMMUNITY): Payer: Self-pay | Admitting: Speech Pathology

## 2024-09-02 ENCOUNTER — Ambulatory Visit (HOSPITAL_COMMUNITY): Admitting: Speech Pathology

## 2024-09-02 DIAGNOSIS — R490 Dysphonia: Secondary | ICD-10-CM

## 2024-09-02 NOTE — Therapy (Signed)
 " OUTPATIENT SPEECH LANGUAGE PATHOLOGY VOICE EVALUATION   Patient Name: Felicia Pugh MRN: [id number] DOB:1946/02/20, 79 y.o., female Today's Date: 09/02/2024  PCP: Dettinger, Fonda LABOR, MD REFERRING PROVIDER: Darlean Ozell NOVAK, MD  END OF SESSION:  End of Session - 09/02/24 1125     Visit Number 2    Number of Visits 6    Date for Recertification  09/30/24    Authorization Type [employer] PPO    SLP Start Time 1115    SLP Stop Time  1200    SLP Time Calculation (min) 45 min    Activity Tolerance Patient tolerated treatment well         Through 09/30/2024  Past Medical History:  Diagnosis Date   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 Tona, no longer seeing anyone (02/2015)   Cataract    Complication of anesthesia    difficulty breathing after waking up from anesthesia   Crohn's disease (HCC) 2016   diagnosed in 2016 and started on Colazal , but inflammation/ulceration in ileum/cecum dates back to 2006.   Diabetes mellitus without complication (HCC)    Diabetic neuropathy (HCC)    Hyperlipidemia    Hypertension    IBS (irritable bowel syndrome)    Stroke (HCC) 08/12/2009   Syncope and collapse    Thyroid  nodule    Ulcer    Past Surgical History:  Procedure Laterality Date   ABDOMINAL HYSTERECTOMY     partial   BACK SURGERY     x3 (Dr Marcellus, Dr Gust)   BACK SURGERY  03/2022   BREAST CYST EXCISION Right 2002   COLONOSCOPY  2006   RMR: Normal colon except for ileocecal valve, eroded/ulcerated areas at ileocecal valve with mucosal hemorrhage present status post biopsy. ulcer (ileocecal valve, biopsy): Chronic active inflammation with ulceration, nonspecific.   COLONOSCOPY  09/06/2009   RMR: Suboptimal prep on the right side. Ulcers noted at IC valve, base of cecum, and scattered through food part of ascending colon. Pathology wiht chronic active colitis, question Crohn's   COLONOSCOPY N/A 03/27/2015   Dr. Shaaron: abnormal cecum and IC valve  most consistent with IBD. TI intubated and appeared normal. Likely Crohn's disease. Patient denies NSAID use. No improvement with Lialda samples at time of initial diagnosis   COLONOSCOPY N/A 05/15/2017   Dr. Shaaron: ab normal IC valve, s/p biopsy with active ileitis, diverticulosis in sigmoid and descending colon. Non-bleeding internal hemorrhoids   ESOPHAGOGASTRODUODENOSCOPY     HAND SURGERY Right    Carpel tunnel and right elbow   LAPAROSCOPIC APPENDECTOMY N/A 07/21/2016   Procedure: APPENDECTOMY LAPAROSCOPIC;  Surgeon: Oneil Budge, MD;  Location: [facility];  Service: General;  Laterality: N/A;   Small bowel capsule endoscopy  06/10/2005   RMR: single erosion versus AVM, distal ileum   Small bowel capsule endoscopy  prior to 05/2005   [place]: reported erosions/ulcerations per medical record. actual report unavailable.   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, with long-term current use of insulin  (HCC) 12/12/2023   Encounter for long-term (current) use of insulin  (HCC) 02/26/2023   Crohn's disease of both small and large intestine without complication (HCC) 06/07/2021   Loose stools 06/07/2021   Bilateral carpal tunnel syndrome 10/17/2020   Upper airway cough syndrome 05/08/2017   Obesity (BMI 30-39.9) 05/08/2017   Solitary pulmonary nodule 05/08/2017  GERD (gastroesophageal reflux disease) 05/02/2017   Type 2 diabetes mellitus with other specified complication (HCC) 05/16/2015   Crohn's disease with complication (HCC) 05/16/2015   Diverticulosis of colon without hemorrhage    History of stroke 07/08/2014   Osteopenia 05/11/2014   Hyperlipidemia associated with type 2 diabetes mellitus (HCC) 06/01/2008   Anemia, chronic disease 06/01/2008   Hypertension associated with diabetes (HCC) 06/01/2008    Onset date: ~09/2023  REFERRING DIAG: referred to ST for  VCD, voice fatigue and UACS  THERAPY DIAG:  Hoarseness  Rationale for Evaluation and Treatment: Rehabilitation  SUBJECTIVE:   SUBJECTIVE STATEMENT: I started taking apple cider vinegar and honey in warm water  and I think that has helped.  Pt accompanied by: self  PERTINENT HISTORY: Felicia Pugh is a 79 yo female who was referred by Dr. Michael Wert for a voice evaluation and treatment due to continuing issues with hoarseness and cough. She states that it is somewhat variable and that some days are better than others. She is seeing Dr. Darlean for upper airway cough syndrome and is now taking Achiphex.  PAIN:  Are you having pain? No  FALLS: Has patient fallen in last 6 months? No  LIVING ENVIRONMENT: Lives with: lives alone Lives in: House/apartment  PLOF:Level of assistance: Independent with ADLs, Independent with IADLs Employment: Retired  PATIENT GOALS: Improve vocal quality  OBJECTIVE:  Note: Objective measures were completed at Evaluation unless otherwise noted.  DIAGNOSTIC FINDINGS:   Flexible Laryngoscopy Procedure Note  Flexible laryngoscopy shows patent anterior nasal cavity with minimal crusting, no discharge or infection.  Normal base of tongue and supraglottis Normal vocal cord mobility without vocal cord nodule, mass, polyp or tumor. Hypopharynx normal without mass, pooling of secretions or aspiration.   COGNITION: Overall cognitive status: Within functional limits for tasks assessed Areas of impairment:  N/A  SOCIAL HISTORY: Occupation: retired Water  intake: 64 oz a day with mix in powder Caffeine/alcohol intake: minimal Daily voice use: minimal  PERCEPTUAL VOICE ASSESSMENT: Voice quality: hoarse, strained, and vocal fatigue Vocal abuse: previous excessive coughing Resonance: normal Respiratory function: clavicular breathing and speaking on residual capacity  OBJECTIVE VOICE ASSESSMENT: Maximum phonation time for sustained ah: 2 seconds  dysphonic Conversational pitch average: 225 Hz Conversational pitch range: 104-435 Hz Conversational loudness average: 63.3 dB Conversational loudness range: 57-71 dB S/z ratio: TBA (Suggestive of dysfunction >1.0)  PATIENT REPORTED OUTCOME MEASURES (PROM): VHI: 18 and RSI: 22  The Voice Handicap Index-10 (VHI-10) was administered. This survey is a series of questions targeting the patient's perception of his/her own voice using a scale of 0-4 (0=Never, 4=Always). Score greater than 11 is abnormal. My voice makes it difficult for people to hear me. 2  People have difficulty understanding me in a noisy room. 2  My voice difficulties restrict personal and social life. 2 I feel left out of conversations because of my voice. 0  My voice problem causes me to lose income. 0  I feel as though I have to strain to produce voice. 3  The clarity of my voice is unpredictable. 4  My voice problem upsets me. 2  My voice makes me feel handicapped. 0  People ask, What's wrong with your voice? 3  TOTAL SCORE:  [X]  Abnormal (raw score >11) []  Normal 18/40   Reflux Symptom Index Hoarseness or a problem with your voice 4 Clearing your throat 2 Excess throat mucous or postnasal drip 2 Difficulty swallowing food, liquids, or pills 0 Coughing after you eat or  after lying down 4 Breathing difficulties or choking episodes 1 Troublesome or annoying cough 5 Sensation of something sticking in your throat or a lump in your throat 4 Heartburn, chest pain, indigestion, or stomach acid coming up 0 TOTAL SCORE: 22 [X]  Abnormal (raw score >13) []  Normal  Normative data suggests that an RSI of greater than or equal to 13 is clinically significant  Therefore, an RSI > 13 may be indicative of significant reflux disease.                                                                                                                             TREATMENT DATE: 09/02/24 SLP reviewed the evaluation from last week  and answered her questions regarding her current symptoms. She stated that a friend with a similar issue encouraged her to take apple cider vinegar and honey in 8 oz of warm water  daily and she has been doing this every morning for about a week and feels it is helping. She did not bring her folder back with the written information provided last week, but states that she did read through it. No throat clearing observed today, but she did have ~5 coughing episodes (brief in duration). This generally occurred after structured breathing and voicing exercises. SLP introduced straw breathing and phonation, humming, lip and tongue trill, y buzz, and easy onset phonation tasks. She was shown how to blow through the straw into a small cup of water . She had difficulty sustaining exhalation longer than 2 seconds and difficulty controlling the bubble size. This would often result in coughing. Pt was recorded so that she could listen to her voice to hear changes. Semi-Occluded Vocal Tract Exercises (SOVTE) and Flow Phonation introduced and utilized today and will continue next session. SLP also provided gentle laryngeal massage to assist with reducing tension. Pt encouraged to continue ACV+honey in water  if she feels it helps and to continue with breathing exercises until next session.   PATIENT EDUCATION: Education details: Provided information on muscle tension dysphonia and straw phonation Person educated: Patient Education method: Explanation and Verbal cues Education comprehension: verbalized understanding  HOME EXERCISE PROGRAM: Pt will completed HEP as assigned to facilitate carryover of treatment strategies and techniques in home and community environment with written cues.  GOALS: Goals reviewed with patient? Yes  SHORT TERM GOALS: Target date: 09/30/24  Regularly practice voice building/strengthening exercises a minimum of 5 days/week for 20+ minutes a day. Baseline: Introduced Goal status: ONGOING    Report understanding of management of laryngopharyngeal reflux through dietary and behavioral modifications. a. Take reflux medication correctly 80% of the time (30-60 minutes before a meal) b. Elevate the head of the bed by 4-6 c. Avoid eating 2-3 hours before lying down 80% of the time   Baseline: Introduced today Goal status: ONGOING   3. The patient will implement appropriate vocal hygiene recommendations on a daily basis.  Baseline: Introduced today Goal status: ONGOING  4.  Patient  will demonstrate balanced coordination of respiration with phonation, as demonstrated by continuous periodicity (no glottal fry) with 85% or more accuracy at the sentence, paragraph level and in conversation.  Baseline: significant dysphonia throughout Goal status: ONGOING   5.  Decrease throat clearing/cough by 80% via reduction in laryngeal hypersensitivity and suppression via: sip liquid, swallow, follow reflux recommendations, increase hydration to 48+oz of water , and improve breath support during speech (not speaking to end of breath). Baseline: Introduced Goal status: ONGOING   LONG TERM GOALS:  Same as short term goals  ASSESSMENT:  CLINICAL IMPRESSION: from initial evaluation on 08/25/24 Patient is a 79 y.o. female who was seen today for a voice evaluation following referral from Dr. Darlean. 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 Recommend trial period of voice therapy to target the above stated goals. Pt may benefit from a voice evaluation at [facility] and can be seen her for treatment. She stated that she does not want to drive to [place] and would like to try trial period of SLP therapy here.   OBJECTIVE IMPAIRMENTS: include voice disorder. These impairments are limiting patient from effectively communicating at home and in  community. Factors affecting potential to achieve goals and functional outcome are previous level of function.. Patient will benefit from skilled SLP services to address above impairments and improve overall function.  REHAB POTENTIAL: Good  PLAN:  SLP FREQUENCY: 1x/week  SLP DURATION: 6 weeks  PLANNED INTERVENTIONS: Cueing hierachy, Internal/external aids, Functional tasks, Multimodal communication approach, SLP instruction and feedback, Compensatory strategies, Patient/family education, [phone] Treatment of speech (30 or 45 min) , and [id number]- Speech Eval Behavioral Qualitative Voice Resonance  Thank you,  Lamar Candy, CCC-SLP [phone]   Dreden Rivere, CCC-SLP 09/02/2024, 11:28 AM    "

## 2024-09-09 ENCOUNTER — Ambulatory Visit (HOSPITAL_COMMUNITY): Admitting: Speech Pathology

## 2024-09-09 ENCOUNTER — Encounter (HOSPITAL_COMMUNITY): Payer: Self-pay | Admitting: Speech Pathology

## 2024-09-09 DIAGNOSIS — R490 Dysphonia: Secondary | ICD-10-CM | POA: Diagnosis not present

## 2024-09-09 NOTE — Therapy (Signed)
 " OUTPATIENT SPEECH LANGUAGE PATHOLOGY VOICE EVALUATION   Patient Name: Felicia Pugh MRN: [id number] DOB:1945/09/03, 79 y.o., female Today's Date: 09/09/2024  PCP: Dettinger, Fonda LABOR, MD REFERRING PROVIDER: Darlean Ozell NOVAK, MD  END OF SESSION:  End of Session - 09/09/24 1040     Visit Number 3    Number of Visits 6    Date for Recertification  09/30/24    Authorization Type [employer] Choice PPO    SLP Start Time 1030    SLP Stop Time  1115    SLP Time Calculation (min) 45 min    Activity Tolerance Patient tolerated treatment well         Through 09/30/2024  Past Medical History:  Diagnosis Date   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 Tona, no longer seeing anyone (02/2015)   Cataract    Complication of anesthesia    difficulty breathing after waking up from anesthesia   Crohn's disease (HCC) 2016   diagnosed in 2016 and started on Colazal , but inflammation/ulceration in ileum/cecum dates back to 2006.   Diabetes mellitus without complication (HCC)    Diabetic neuropathy (HCC)    Hyperlipidemia    Hypertension    IBS (irritable bowel syndrome)    Stroke (HCC) 08/12/2009   Syncope and collapse    Thyroid  nodule    Ulcer    Past Surgical History:  Procedure Laterality Date   ABDOMINAL HYSTERECTOMY     partial   BACK SURGERY     x3 (Dr Marcellus, Dr Gust)   BACK SURGERY  03/2022   BREAST CYST EXCISION Right 2002   COLONOSCOPY  2006   RMR: Normal colon except for ileocecal valve, eroded/ulcerated areas at ileocecal valve with mucosal hemorrhage present status post biopsy. ulcer (ileocecal valve, biopsy): Chronic active inflammation with ulceration, nonspecific.   COLONOSCOPY  09/06/2009   RMR: Suboptimal prep on the right side. Ulcers noted at IC valve, base of cecum, and scattered through food part of ascending colon. Pathology wiht chronic active colitis, question Crohn's   COLONOSCOPY N/A 03/27/2015   Dr. Shaaron: abnormal cecum and IC valve  most consistent with IBD. TI intubated and appeared normal. Likely Crohn's disease. Patient denies NSAID use. No improvement with Lialda samples at time of initial diagnosis   COLONOSCOPY N/A 05/15/2017   Dr. Shaaron: ab normal IC valve, s/p biopsy with active ileitis, diverticulosis in sigmoid and descending colon. Non-bleeding internal hemorrhoids   ESOPHAGOGASTRODUODENOSCOPY     HAND SURGERY Right    Carpel tunnel and right elbow   LAPAROSCOPIC APPENDECTOMY N/A 07/21/2016   Procedure: APPENDECTOMY LAPAROSCOPIC;  Surgeon: Oneil Budge, MD;  Location: [facility];  Service: General;  Laterality: N/A;   Small bowel capsule endoscopy  06/10/2005   RMR: single erosion versus AVM, distal ileum   Small bowel capsule endoscopy  prior to 05/2005   [place]: reported erosions/ulcerations per medical record. actual report unavailable.   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, with long-term current use of insulin  (HCC) 12/12/2023   Encounter for long-term (current) use of insulin  (HCC) 02/26/2023   Crohn's disease of both small and large intestine without complication (HCC) 06/07/2021   Loose stools 06/07/2021   Bilateral carpal tunnel syndrome 10/17/2020   Upper airway cough syndrome 05/08/2017   Obesity (BMI 30-39.9) 05/08/2017   Solitary pulmonary nodule 05/08/2017  GERD (gastroesophageal reflux disease) 05/02/2017   Type 2 diabetes mellitus with other specified complication (HCC) 05/16/2015   Crohn's disease with complication (HCC) 05/16/2015   Diverticulosis of colon without hemorrhage    History of stroke 07/08/2014   Osteopenia 05/11/2014   Hyperlipidemia associated with type 2 diabetes mellitus (HCC) 06/01/2008   Anemia, chronic disease 06/01/2008   Hypertension associated with diabetes (HCC) 06/01/2008    Onset date: ~09/2023  REFERRING DIAG: referred to ST for  VCD, voice fatigue and UACS  THERAPY DIAG:  Hoarseness  Rationale for Evaluation and Treatment: Rehabilitation  SUBJECTIVE:   SUBJECTIVE STATEMENT: I was coughing a lot yesterday, but better today.  Pt accompanied by: self  PERTINENT HISTORY: Felicia Pugh is a 79 yo female who was referred by Dr. Michael Wert for a voice evaluation and treatment due to continuing issues with hoarseness and cough. She states that it is somewhat variable and that some days are better than others. She is seeing Dr. Darlean for upper airway cough syndrome and is now taking Achiphex.  PAIN:  Are you having pain? No  FALLS: Has patient fallen in last 6 months? No  LIVING ENVIRONMENT: Lives with: lives alone Lives in: House/apartment  PLOF:Level of assistance: Independent with ADLs, Independent with IADLs Employment: Retired  PATIENT GOALS: Improve vocal quality  OBJECTIVE:  Note: Objective measures were completed at Evaluation unless otherwise noted.  DIAGNOSTIC FINDINGS:   Flexible Laryngoscopy Procedure Note  Flexible laryngoscopy shows patent anterior nasal cavity with minimal crusting, no discharge or infection.  Normal base of tongue and supraglottis Normal vocal cord mobility without vocal cord nodule, mass, polyp or tumor. Hypopharynx normal without mass, pooling of secretions or aspiration.   COGNITION: Overall cognitive status: Within functional limits for tasks assessed Areas of impairment:  N/A  SOCIAL HISTORY: Occupation: retired Water  intake: 64 oz a day with mix in powder Caffeine/alcohol intake: minimal Daily voice use: minimal  PERCEPTUAL VOICE ASSESSMENT: Voice quality: hoarse, strained, and vocal fatigue Vocal abuse: previous excessive coughing Resonance: normal Respiratory function: clavicular breathing and speaking on residual capacity  OBJECTIVE VOICE ASSESSMENT: Maximum phonation time for sustained ah: 2 seconds dysphonic Conversational pitch average: 225  Hz Conversational pitch range: 104-435 Hz Conversational loudness average: 63.3 dB Conversational loudness range: 57-71 dB S/z ratio: TBA (Suggestive of dysfunction >1.0)  PATIENT REPORTED OUTCOME MEASURES (PROM): VHI: 18 and RSI: 22  The Voice Handicap Index-10 (VHI-10) was administered. This survey is a series of questions targeting the patient's perception of his/her own voice using a scale of 0-4 (0=Never, 4=Always). Score greater than 11 is abnormal. My voice makes it difficult for people to hear me. 2  People have difficulty understanding me in a noisy room. 2  My voice difficulties restrict personal and social life. 2 I feel left out of conversations because of my voice. 0  My voice problem causes me to lose income. 0  I feel as though I have to strain to produce voice. 3  The clarity of my voice is unpredictable. 4  My voice problem upsets me. 2  My voice makes me feel handicapped. 0  People ask, What's wrong with your voice? 3  TOTAL SCORE:  [X]  Abnormal (raw score >11) []  Normal 18/40   Reflux Symptom Index Hoarseness or a problem with your voice 4 Clearing your throat 2 Excess throat mucous or postnasal drip 2 Difficulty swallowing food, liquids, or pills 0 Coughing after you eat or after lying down 4 Breathing difficulties or choking  episodes 1 Troublesome or annoying cough 5 Sensation of something sticking in your throat or a lump in your throat 4 Heartburn, chest pain, indigestion, or stomach acid coming up 0 TOTAL SCORE: 22 [X]  Abnormal (raw score >13) []  Normal  Normative data suggests that an RSI of greater than or equal to 13 is clinically significant  Therefore, an RSI > 13 may be indicative of significant reflux disease.                                                                                                                             Previous Treatment: SLP reviewed the evaluation from last week and answered her questions regarding her current  symptoms. She stated that a friend with a similar issue encouraged her to take apple cider vinegar and honey in 8 oz of warm water  daily and she has been doing this every morning for about a week and feels it is helping. She did not bring her folder back with the written information provided last week, but states that she did read through it. No throat clearing observed today, but she did have ~5 coughing episodes (brief in duration). This generally occurred after structured breathing and voicing exercises. SLP introduced straw breathing and phonation, humming, lip and tongue trill, y buzz, and easy onset phonation tasks. She was shown how to blow through the straw into a small cup of water . She had difficulty sustaining exhalation longer than 2 seconds and difficulty controlling the bubble size. This would often result in coughing. Pt was recorded so that she could listen to her voice to hear changes. Semi-Occluded Vocal Tract Exercises (SOVTE) and Flow Phonation introduced and utilized today and will continue next session. SLP also provided gentle laryngeal massage to assist with reducing tension. Pt encouraged to continue ACV+honey in water  if she feels it helps and to continue with breathing exercises until next session.   TREATMENT DATE: 09/09/24 Pt brought her folder from the initial evaluation and she stated that she has been completing the straw phonation exercises at home. In session, vocal quality is characterized by harshness and tightness. She is continuing with drinking ACV and honey and finds some relief. No throat clearing observed in session today and 3 brief coughing episodes (after expiratory tasks). Pt was guided through diaphragmatic breathing exercises with max assist and use of props to facilitate. SOVTE and Flow phonation also incorporated to reduce laryngeal tension with modest progress. Pt achieves improved vocal quality (reduced harshness) when able to produce a slightly higher pitch, but  difficult for her to sustain or carryover into speech tasks at this time. Pt with marginal success with /n/ CV tasks and given for homework. Pt also encouraged to continue with diaphragmatic breathing exercises at home in a reclined position. Continue to target goals to help reduce muscle tension dysphonia.  PATIENT EDUCATION: Education details: Provided /n/ CV sounds to practice Person educated: Patient Education method: Explanation and Verbal cues Education comprehension:  verbalized understanding  HOME EXERCISE PROGRAM: Pt will completed HEP as assigned to facilitate carryover of treatment strategies and techniques in home and community environment with written cues.  GOALS: Goals reviewed with patient? Yes  SHORT TERM GOALS: Target date: 09/30/24  Regularly practice voice building/strengthening exercises a minimum of 5 days/week for 20+ minutes a day. Baseline: Introduced Goal status: ONGOING   Report understanding of management of laryngopharyngeal reflux through dietary and behavioral modifications. a. Take reflux medication correctly 80% of the time (30-60 minutes before a meal) b. Elevate the head of the bed by 4-6 c. Avoid eating 2-3 hours before lying down 80% of the time   Baseline: Introduced today Goal status: ONGOING   3. The patient will implement appropriate vocal hygiene recommendations on a daily basis.  Baseline: Introduced today Goal status: ONGOING  4.  Patient will demonstrate balanced coordination of respiration with phonation, as demonstrated by continuous periodicity (no glottal fry) with 85% or more accuracy at the sentence, paragraph level and in conversation.  Baseline: significant dysphonia throughout Goal status: ONGOING   5.  Decrease throat clearing/cough by 80% via reduction in laryngeal hypersensitivity and suppression via: sip liquid, swallow, follow reflux recommendations, increase hydration to 48+oz of water , and improve breath support during  speech (not speaking to end of breath). Baseline: Introduced Goal status: ONGOING   LONG TERM GOALS:  Same as short term goals  ASSESSMENT:  CLINICAL IMPRESSION: from initial evaluation on 08/25/24 Patient is a 79 y.o. female who was seen today for a voice evaluation following referral from Dr. Darlean. 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 Recommend trial period of voice therapy to target the above stated goals. Pt may benefit from a voice evaluation at [facility] and can be seen her for treatment. She stated that she does not want to drive to [place] and would like to try trial period of SLP therapy here.   OBJECTIVE IMPAIRMENTS: include voice disorder. These impairments are limiting patient from effectively communicating at home and in community. Factors affecting potential to achieve goals and functional outcome are previous level of function.. Patient will benefit from skilled SLP services to address above impairments and improve overall function.  REHAB POTENTIAL: Good  PLAN:  SLP FREQUENCY: 1x/week  SLP DURATION: 6 weeks  PLANNED INTERVENTIONS: Cueing hierachy, Internal/external aids, Functional tasks, Multimodal communication approach, SLP instruction and feedback, Compensatory strategies, Patient/family education, [phone] Treatment of speech (30 or 45 min) , and [id number]- Speech Eval Behavioral Qualitative Voice Resonance  Thank you,  Lamar Candy, CCC-SLP [phone]   Pheonix Wisby, CCC-SLP 09/09/2024, 10:41 AM    "

## 2024-09-13 ENCOUNTER — Ambulatory Visit: Admitting: Nurse Practitioner

## 2024-09-16 ENCOUNTER — Ambulatory Visit (HOSPITAL_COMMUNITY): Admitting: Speech Pathology

## 2024-09-23 ENCOUNTER — Ambulatory Visit (HOSPITAL_COMMUNITY): Admitting: Speech Pathology

## 2024-09-27 ENCOUNTER — Ambulatory Visit (HOSPITAL_COMMUNITY): Admitting: Speech Pathology

## 2024-09-30 ENCOUNTER — Ambulatory Visit: Admitting: Family Medicine

## 2024-10-01 ENCOUNTER — Ambulatory Visit: Admitting: Nurse Practitioner

## 2025-06-03 ENCOUNTER — Ambulatory Visit
